# Patient Record
Sex: Female | Born: 1955
Health system: Southern US, Community
[De-identification: ages and names within clinical notes are randomized; demographics above are authoritative.]

## PROBLEM LIST (undated history)

## (undated) DIAGNOSIS — T8859XA Other complications of anesthesia, initial encounter: Secondary | ICD-10-CM

## (undated) DIAGNOSIS — S2249XA Multiple fractures of ribs, unspecified side, initial encounter for closed fracture: Secondary | ICD-10-CM

## (undated) DIAGNOSIS — S92353A Displaced fracture of fifth metatarsal bone, unspecified foot, initial encounter for closed fracture: Secondary | ICD-10-CM

## (undated) DIAGNOSIS — R413 Other amnesia: Secondary | ICD-10-CM

## (undated) DIAGNOSIS — F329 Major depressive disorder, single episode, unspecified: Secondary | ICD-10-CM

## (undated) DIAGNOSIS — R102 Pelvic and perineal pain unspecified side: Secondary | ICD-10-CM

## (undated) DIAGNOSIS — E039 Hypothyroidism, unspecified: Secondary | ICD-10-CM

## (undated) DIAGNOSIS — G250 Essential tremor: Secondary | ICD-10-CM

## (undated) DIAGNOSIS — R51 Headache: Secondary | ICD-10-CM

## (undated) DIAGNOSIS — S32599A Other specified fracture of unspecified pubis, initial encounter for closed fracture: Secondary | ICD-10-CM

## (undated) DIAGNOSIS — F3289 Other specified depressive episodes: Secondary | ICD-10-CM

## (undated) DIAGNOSIS — B182 Chronic viral hepatitis C: Secondary | ICD-10-CM

## (undated) DIAGNOSIS — R3129 Other microscopic hematuria: Secondary | ICD-10-CM

## (undated) DIAGNOSIS — IMO0002 Reserved for concepts with insufficient information to code with codable children: Secondary | ICD-10-CM

## (undated) DIAGNOSIS — M5481 Occipital neuralgia: Secondary | ICD-10-CM

## (undated) DIAGNOSIS — M5412 Radiculopathy, cervical region: Secondary | ICD-10-CM

## (undated) DIAGNOSIS — B1920 Unspecified viral hepatitis C without hepatic coma: Secondary | ICD-10-CM

## (undated) DIAGNOSIS — G252 Other specified forms of tremor: Secondary | ICD-10-CM

## (undated) DIAGNOSIS — M1990 Unspecified osteoarthritis, unspecified site: Secondary | ICD-10-CM

## (undated) DIAGNOSIS — F4312 Post-traumatic stress disorder, chronic: Secondary | ICD-10-CM

## (undated) DIAGNOSIS — M75100 Unspecified rotator cuff tear or rupture of unspecified shoulder, not specified as traumatic: Secondary | ICD-10-CM

## (undated) DIAGNOSIS — I10 Essential (primary) hypertension: Secondary | ICD-10-CM

## (undated) DIAGNOSIS — G43909 Migraine, unspecified, not intractable, without status migrainosus: Secondary | ICD-10-CM

## (undated) HISTORY — DX: Other amnesia: R41.3

## (undated) HISTORY — DX: Essential tremor: G25.2

## (undated) HISTORY — DX: Other microscopic hematuria: R31.29

## (undated) HISTORY — DX: Migraine, unspecified, not intractable, without status migrainosus: G43.909

## (undated) HISTORY — DX: Essential tremor: G25.0

## (undated) HISTORY — PX: BREAST EXCISIONAL BIOPSY: SUR124

## (undated) HISTORY — DX: Pelvic and perineal pain: R10.2

## (undated) HISTORY — DX: Radiculopathy, cervical region: M54.12

## (undated) HISTORY — DX: Unspecified viral hepatitis C without hepatic coma: B19.20

## (undated) HISTORY — DX: Pelvic and perineal pain unspecified side: R10.20

## (undated) HISTORY — DX: Unspecified rotator cuff tear or rupture of unspecified shoulder, not specified as traumatic: M75.100

## (undated) HISTORY — DX: Major depressive disorder, single episode, unspecified: F32.9

## (undated) HISTORY — DX: Reserved for concepts with insufficient information to code with codable children: IMO0002

## (undated) HISTORY — DX: Chronic viral hepatitis C: B18.2

## (undated) HISTORY — DX: Headache: R51

## (undated) HISTORY — DX: Displaced fracture of fifth metatarsal bone, unspecified foot, initial encounter for closed fracture: S92.353A

## (undated) HISTORY — DX: Other specified depressive episodes: F32.89

## (undated) HISTORY — DX: Multiple fractures of ribs, unspecified side, initial encounter for closed fracture: S22.49XA

## (undated) HISTORY — DX: Other specified fracture of unspecified pubis, initial encounter for closed fracture: S32.599A

---

## 1997-04-07 ENCOUNTER — Ambulatory Visit (HOSPITAL_COMMUNITY): Admission: RE | Admit: 1997-04-07 | Discharge: 1997-04-07 | Payer: Self-pay | Admitting: Family Medicine

## 1997-09-29 ENCOUNTER — Ambulatory Visit (HOSPITAL_COMMUNITY): Admission: RE | Admit: 1997-09-29 | Discharge: 1997-09-29 | Payer: Self-pay | Admitting: Family Medicine

## 1997-09-29 ENCOUNTER — Encounter: Payer: Self-pay | Admitting: Family Medicine

## 1998-08-05 ENCOUNTER — Other Ambulatory Visit: Admission: RE | Admit: 1998-08-05 | Discharge: 1998-08-05 | Payer: Self-pay | Admitting: Family Medicine

## 1998-10-02 ENCOUNTER — Ambulatory Visit (HOSPITAL_COMMUNITY): Admission: RE | Admit: 1998-10-02 | Discharge: 1998-10-02 | Payer: Self-pay | Admitting: Family Medicine

## 1998-10-02 ENCOUNTER — Encounter: Payer: Self-pay | Admitting: Family Medicine

## 1998-10-05 ENCOUNTER — Other Ambulatory Visit: Admission: RE | Admit: 1998-10-05 | Discharge: 1998-10-05 | Payer: Self-pay | Admitting: Obstetrics and Gynecology

## 1999-10-05 ENCOUNTER — Ambulatory Visit (HOSPITAL_COMMUNITY): Admission: RE | Admit: 1999-10-05 | Discharge: 1999-10-05 | Payer: Self-pay | Admitting: Family Medicine

## 1999-10-05 ENCOUNTER — Encounter: Payer: Self-pay | Admitting: Family Medicine

## 1999-12-10 ENCOUNTER — Encounter: Admission: RE | Admit: 1999-12-10 | Discharge: 1999-12-10 | Payer: Self-pay | Admitting: Family Medicine

## 1999-12-10 ENCOUNTER — Encounter: Payer: Self-pay | Admitting: Family Medicine

## 1999-12-17 ENCOUNTER — Ambulatory Visit (HOSPITAL_COMMUNITY): Admission: RE | Admit: 1999-12-17 | Discharge: 1999-12-17 | Payer: Self-pay | Admitting: Family Medicine

## 1999-12-17 ENCOUNTER — Encounter: Payer: Self-pay | Admitting: Family Medicine

## 2000-01-24 ENCOUNTER — Other Ambulatory Visit: Admission: RE | Admit: 2000-01-24 | Discharge: 2000-01-24 | Payer: Self-pay | Admitting: Obstetrics and Gynecology

## 2000-03-24 ENCOUNTER — Other Ambulatory Visit: Admission: RE | Admit: 2000-03-24 | Discharge: 2000-03-24 | Payer: Self-pay | Admitting: Orthopedic Surgery

## 2000-10-09 ENCOUNTER — Ambulatory Visit (HOSPITAL_COMMUNITY): Admission: RE | Admit: 2000-10-09 | Discharge: 2000-10-09 | Payer: Self-pay | Admitting: Family Medicine

## 2000-10-09 ENCOUNTER — Encounter: Payer: Self-pay | Admitting: Family Medicine

## 2000-10-16 ENCOUNTER — Encounter: Payer: Self-pay | Admitting: Family Medicine

## 2000-10-16 ENCOUNTER — Encounter: Admission: RE | Admit: 2000-10-16 | Discharge: 2000-10-16 | Payer: Self-pay | Admitting: Family Medicine

## 2000-12-20 ENCOUNTER — Encounter: Admission: RE | Admit: 2000-12-20 | Discharge: 2000-12-20 | Payer: Self-pay | Admitting: Gastroenterology

## 2000-12-20 ENCOUNTER — Encounter: Payer: Self-pay | Admitting: Gastroenterology

## 2001-10-19 ENCOUNTER — Ambulatory Visit (HOSPITAL_COMMUNITY): Admission: RE | Admit: 2001-10-19 | Discharge: 2001-10-19 | Payer: Self-pay | Admitting: Family Medicine

## 2001-10-19 ENCOUNTER — Encounter: Payer: Self-pay | Admitting: Family Medicine

## 2002-01-03 HISTORY — PX: SHOULDER ARTHROSCOPY W/ ROTATOR CUFF REPAIR: SHX2400

## 2002-04-24 ENCOUNTER — Encounter: Admission: RE | Admit: 2002-04-24 | Discharge: 2002-04-24 | Payer: Self-pay | Admitting: Orthopedic Surgery

## 2002-04-24 ENCOUNTER — Encounter: Payer: Self-pay | Admitting: Orthopedic Surgery

## 2002-10-22 ENCOUNTER — Encounter: Payer: Self-pay | Admitting: Family Medicine

## 2002-10-22 ENCOUNTER — Ambulatory Visit (HOSPITAL_COMMUNITY): Admission: RE | Admit: 2002-10-22 | Discharge: 2002-10-22 | Payer: Self-pay | Admitting: Family Medicine

## 2003-10-29 ENCOUNTER — Ambulatory Visit (HOSPITAL_COMMUNITY): Admission: RE | Admit: 2003-10-29 | Discharge: 2003-10-29 | Payer: Self-pay | Admitting: Internal Medicine

## 2004-01-06 ENCOUNTER — Ambulatory Visit: Payer: Self-pay | Admitting: Gastroenterology

## 2004-10-06 ENCOUNTER — Ambulatory Visit (HOSPITAL_COMMUNITY): Admission: RE | Admit: 2004-10-06 | Discharge: 2004-10-06 | Payer: Self-pay | Admitting: Family Medicine

## 2005-10-07 ENCOUNTER — Ambulatory Visit (HOSPITAL_COMMUNITY): Admission: RE | Admit: 2005-10-07 | Discharge: 2005-10-07 | Payer: Self-pay | Admitting: Family Medicine

## 2006-01-03 HISTORY — PX: SHOULDER ARTHROSCOPY W/ ROTATOR CUFF REPAIR: SHX2400

## 2006-10-09 ENCOUNTER — Ambulatory Visit (HOSPITAL_COMMUNITY): Admission: RE | Admit: 2006-10-09 | Discharge: 2006-10-09 | Payer: Self-pay | Admitting: Family Medicine

## 2006-11-21 ENCOUNTER — Ambulatory Visit: Payer: Self-pay | Admitting: Gastroenterology

## 2007-04-05 ENCOUNTER — Ambulatory Visit (HOSPITAL_BASED_OUTPATIENT_CLINIC_OR_DEPARTMENT_OTHER): Admission: RE | Admit: 2007-04-05 | Discharge: 2007-04-05 | Payer: Self-pay | Admitting: Orthopedic Surgery

## 2007-10-10 ENCOUNTER — Ambulatory Visit (HOSPITAL_COMMUNITY): Admission: RE | Admit: 2007-10-10 | Discharge: 2007-10-10 | Payer: Self-pay | Admitting: Family Medicine

## 2008-10-10 ENCOUNTER — Ambulatory Visit (HOSPITAL_COMMUNITY): Admission: RE | Admit: 2008-10-10 | Discharge: 2008-10-10 | Payer: Self-pay | Admitting: Family Medicine

## 2009-02-10 ENCOUNTER — Ambulatory Visit (HOSPITAL_BASED_OUTPATIENT_CLINIC_OR_DEPARTMENT_OTHER): Admission: RE | Admit: 2009-02-10 | Discharge: 2009-02-10 | Payer: Self-pay | Admitting: Orthopedic Surgery

## 2009-05-21 ENCOUNTER — Ambulatory Visit: Payer: Self-pay | Admitting: Psychology

## 2009-07-22 ENCOUNTER — Ambulatory Visit: Payer: Self-pay | Admitting: Psychology

## 2009-09-15 ENCOUNTER — Ambulatory Visit (HOSPITAL_COMMUNITY): Admission: RE | Admit: 2009-09-15 | Discharge: 2009-09-15 | Payer: Self-pay | Admitting: Neurology

## 2009-10-16 ENCOUNTER — Ambulatory Visit (HOSPITAL_COMMUNITY): Admission: RE | Admit: 2009-10-16 | Discharge: 2009-10-16 | Payer: Self-pay | Admitting: Family Medicine

## 2009-12-21 ENCOUNTER — Ambulatory Visit (HOSPITAL_COMMUNITY): Admission: RE | Admit: 2009-12-21 | Payer: Self-pay | Source: Home / Self Care | Admitting: Family Medicine

## 2010-01-24 ENCOUNTER — Encounter: Payer: Self-pay | Admitting: Family Medicine

## 2010-02-24 ENCOUNTER — Emergency Department (HOSPITAL_BASED_OUTPATIENT_CLINIC_OR_DEPARTMENT_OTHER)
Admission: EM | Admit: 2010-02-24 | Discharge: 2010-02-24 | Disposition: A | Discharge status: Home / Self Care | Payer: 59 | Attending: Emergency Medicine | Admitting: Emergency Medicine

## 2010-02-24 DIAGNOSIS — IMO0002 Reserved for concepts with insufficient information to code with codable children: Secondary | ICD-10-CM | POA: Insufficient documentation

## 2010-02-24 DIAGNOSIS — G8929 Other chronic pain: Secondary | ICD-10-CM | POA: Insufficient documentation

## 2010-02-24 DIAGNOSIS — S139XXA Sprain of joints and ligaments of unspecified parts of neck, initial encounter: Secondary | ICD-10-CM | POA: Insufficient documentation

## 2010-02-24 DIAGNOSIS — M25519 Pain in unspecified shoulder: Secondary | ICD-10-CM | POA: Insufficient documentation

## 2010-02-24 DIAGNOSIS — Y929 Unspecified place or not applicable: Secondary | ICD-10-CM | POA: Insufficient documentation

## 2010-02-24 DIAGNOSIS — X58XXXA Exposure to other specified factors, initial encounter: Secondary | ICD-10-CM | POA: Insufficient documentation

## 2010-02-24 DIAGNOSIS — M5412 Radiculopathy, cervical region: Secondary | ICD-10-CM | POA: Insufficient documentation

## 2010-02-24 DIAGNOSIS — F341 Dysthymic disorder: Secondary | ICD-10-CM | POA: Insufficient documentation

## 2010-02-24 DIAGNOSIS — M542 Cervicalgia: Secondary | ICD-10-CM | POA: Insufficient documentation

## 2010-05-18 NOTE — Op Note (Signed)
Toni [name], Toni [name]       ACCOUNT NO.:  [id number]   MEDICAL RECORD NO.:  [id number]          PATIENT TYPE:  AMB   LOCATION:  DSC                          FACILITY:  [facility]   PHYSICIAN:  Katy Fitch. Sypher, M.D. DATE OF BIRTH:  02/16/1955   DATE OF PROCEDURE:  04/05/2007  DATE OF DISCHARGE:                               OPERATIVE REPORT   PREOPERATIVE DIAGNOSIS:  Chronic stage II impingement left shoulder with  acromioclavicular degenerative arthritis and tendinopathy of rotator  cuff noted on preoperative magnetic resonance imaging.   POSTOPERATIVE DIAGNOSIS:  Deep surface rotator cuff degenerative tear  measuring approximate 20% thickness of the supraspinatus, infraspinatus  with degenerative labral changes and unfavorable acromioclavicular  anatomy and anterolateral acromial morphology.   OPERATION:  1. Examination of left shoulder under anesthesia documenting capsular      stability.  2. Arthroscopic debridement of deep surface rotator cuff tear and      labral degenerative changes.  3. Arthroscopic subacromial decompression.  4. Arthroscopic distal clavicle resection.   SURGEON:  Josephine Igo, M.D.   ASSISTANT:  Molly Maduro Dasnoit PA-C.   ANESTHESIA:  General by endotracheal technique supplemented by a left  interscalene block.   SUPERVISING ANESTHESIOLOGIST:  Zenon Mayo, M.D.   INDICATIONS:  Toni [name] is a 55 year old woman who is well  acquainted with our practice.  She has a history of chronic left  shoulder pain.  An MRI of the left shoulder demonstrated AC arthropathy,  unfavorable anterolateral acromial morphology and rotator cuff  tendinopathy.   Due to a failure to respond to nonoperative measures including anti-  inflammatory medications, structured physical therapy exercises and  rest, she is now brought to the operating room anticipating arthroscopic  intervention.   Preoperatively she was noted have a partial thickness tear  rotator cuff.  She was advised that we would decompress the cuff and debride the tear  in an effort to try to preserve her rotator cuff health.   She understood that we would also thoroughly assess for shoulder with  the arthroscope and provide appropriate intervention based on our  findings.   After informed consent, she is brought to the operating room at this  time.   DESCRIPTION OF PROCEDURE:  Toni [name] is brought to the  operating room and placed in the supine position on the operating table.   Following placement of an interscalene block in the holding area,  anesthesia of the left upper extremity and forequarter was obtained.   She is brought to room 1, placed in the supine position on the operating  table and under Dr. Jarrett Ables direct supervision general endotracheal  anesthesia induced.   She was carefully positioned in the beach-chair position with the aid of  a torso _________  designed for shoulder arthroscopy.   The entire left upper extremity and forequarter prepped with DuraPrep  and draped with impervious arthroscopy drapes.   The procedure commenced with examination of the left shoulder under  anesthesia.  She was noted be stable in all planes of testing including  anterior, inferior and posterior stress.   The arthroscope was introduced through a standard posterior viewing  portal followed by identification of the intra-articular pathology.  She  was noted have a 20% thickness deep surface degenerative tear of the  supraspinatus infraspinatus tendons with fragments hanging within the  joint.  The long head of the biceps had a stable origin at the superior  labrum.  The labrum had degenerative changes from 2 o'clock anteriorly  to 10 o'clock posteriorly.  An anterior portal was created under direct  vision followed by use of a suction shaver to debride the labrum and  deep surface of the cuff to stable margin.  There was no evidence for a   full-thickness rotator cuff tear.  The anterior capsule was debrided of  limited synovitis.  The inferior recess was examined and found to be  normal.  The anterior, anterior inferior, inferior and inferior  posterior labrum was noted be normal.   After hemostasis was achieved with bipolar cautery, the arthroscope was  removed from the glenohumeral joint and placed in the subacromial space.   The subacromial space was notable for friable bursitis.  We performed  bursectomy with the cutting cautery and the suction shaver followed by  identification of the morphology of the coracoacromial arch.  The  acromion was cleared of soft tissues and leveled to a type 1 morphology  with relaxation of the coracoacromial ligament.  Hemostasis was achieved  with the bipolar cautery.  The capsule of the [facility] joint was violated by  the arthritis and the distal clavicle had significant osteophyte  present.  The distal 15 mm of clavicle was removed arthroscopically with  a suction bur.  Hemostasis was achieved with the bipolar cautery.  The  hypertrophic bursa was debrided to a stable margin followed by  hemostasis.  There was no sign of a bursal side rotator cuff tear.   The scope was removed and the wounds repaired with mattress suture of 3-  0 Prolene.   There were no apparent complications.   Ms. Wecker was placed in a sling and transferred to the recovery  room with stable vital signs.   We anticipate discharge to her home with prescriptions for Dilaudid 2 mg  one to two tablets p.o. q.4-6 h. p.r.n. pain 30 tablets without refill.  Also Motrin 600 mg one p.o. q.6 h. p.r.n. pain 30 tablets with one  refill and Keflex 500 mg one p.o. q.8 h. x4 days as a prophylactic  antibiotic.   She will return to our office for follow-up in 24 hours for dressing  change and back for therapy in approximately 72 hours.      Katy Fitch Sypher, M.D.  Electronically Signed     RVS/MEDQ  D:  04/05/2007   T:  04/05/2007  Job:  [id number]   cc:   Katy Fitch. Sypher, M.D.

## 2010-10-05 ENCOUNTER — Other Ambulatory Visit (HOSPITAL_COMMUNITY): Payer: Self-pay | Admitting: Family Medicine

## 2010-10-05 DIAGNOSIS — Z1231 Encounter for screening mammogram for malignant neoplasm of breast: Secondary | ICD-10-CM

## 2010-10-07 ENCOUNTER — Ambulatory Visit (HOSPITAL_COMMUNITY)
Admission: RE | Admit: 2010-10-07 | Discharge: 2010-10-07 | Disposition: A | Discharge status: Home / Self Care | Payer: 59 | Source: Ambulatory Visit | Attending: Family Medicine | Admitting: Family Medicine

## 2010-10-07 ENCOUNTER — Other Ambulatory Visit (HOSPITAL_COMMUNITY): Payer: Self-pay | Admitting: Family Medicine

## 2010-10-07 DIAGNOSIS — Z1231 Encounter for screening mammogram for malignant neoplasm of breast: Secondary | ICD-10-CM | POA: Insufficient documentation

## 2010-10-07 DIAGNOSIS — M8580 Other specified disorders of bone density and structure, unspecified site: Secondary | ICD-10-CM

## 2010-10-20 ENCOUNTER — Ambulatory Visit (HOSPITAL_COMMUNITY)
Admission: RE | Admit: 2010-10-20 | Discharge: 2010-10-20 | Disposition: A | Discharge status: Home / Self Care | Payer: 59 | Source: Ambulatory Visit | Attending: Family Medicine | Admitting: Family Medicine

## 2010-10-20 DIAGNOSIS — Z780 Asymptomatic menopausal state: Secondary | ICD-10-CM | POA: Insufficient documentation

## 2010-10-20 DIAGNOSIS — M8580 Other specified disorders of bone density and structure, unspecified site: Secondary | ICD-10-CM

## 2010-10-20 DIAGNOSIS — Z13820 Encounter for screening for osteoporosis: Secondary | ICD-10-CM | POA: Insufficient documentation

## 2011-07-20 ENCOUNTER — Other Ambulatory Visit: Payer: Self-pay | Admitting: Neurosurgery

## 2011-07-20 DIAGNOSIS — M542 Cervicalgia: Secondary | ICD-10-CM

## 2011-08-05 ENCOUNTER — Other Ambulatory Visit: Payer: 59

## 2011-08-22 ENCOUNTER — Ambulatory Visit
Admission: RE | Admit: 2011-08-22 | Discharge: 2011-08-22 | Disposition: A | Discharge status: Home / Self Care | Payer: 59 | Source: Ambulatory Visit | Attending: Neurosurgery | Admitting: Neurosurgery

## 2011-08-22 ENCOUNTER — Other Ambulatory Visit: Payer: Self-pay | Admitting: Neurosurgery

## 2011-08-22 VITALS — BP 129/73

## 2011-08-22 DIAGNOSIS — M542 Cervicalgia: Secondary | ICD-10-CM

## 2011-08-22 MED ORDER — IOHEXOL 300 MG/ML  SOLN
1.0000 mL | Freq: Once | INTRAMUSCULAR | Status: AC | PRN
  Administered 2011-08-22: 1 mL via INTRA_ARTICULAR

## 2011-08-22 MED ORDER — DIAZEPAM 5 MG PO TABS
10.0000 mg | ORAL_TABLET | Freq: Once | ORAL | Status: AC
  Administered 2011-08-22: 10 mg via ORAL

## 2011-09-19 ENCOUNTER — Other Ambulatory Visit (HOSPITAL_COMMUNITY): Payer: Self-pay | Admitting: Family Medicine

## 2011-09-19 DIAGNOSIS — Z1231 Encounter for screening mammogram for malignant neoplasm of breast: Secondary | ICD-10-CM

## 2011-10-13 ENCOUNTER — Ambulatory Visit (HOSPITAL_COMMUNITY)
Admission: RE | Admit: 2011-10-13 | Discharge: 2011-10-13 | Disposition: A | Discharge status: Home / Self Care | Payer: 59 | Source: Ambulatory Visit | Attending: Family Medicine | Admitting: Family Medicine

## 2011-10-13 DIAGNOSIS — Z1231 Encounter for screening mammogram for malignant neoplasm of breast: Secondary | ICD-10-CM

## 2012-05-14 ENCOUNTER — Ambulatory Visit: Payer: Self-pay | Admitting: Neurology

## 2012-05-22 ENCOUNTER — Ambulatory Visit (INDEPENDENT_AMBULATORY_CARE_PROVIDER_SITE_OTHER): Payer: 59 | Admitting: Neurology

## 2012-05-22 ENCOUNTER — Encounter: Payer: Self-pay | Admitting: Neurology

## 2012-05-22 VITALS — BP 121/81 | HR 90 | Temp 97.8°F | Ht 63.0 in | Wt 184.0 lb

## 2012-05-22 DIAGNOSIS — F329 Major depressive disorder, single episode, unspecified: Secondary | ICD-10-CM

## 2012-05-22 DIAGNOSIS — R413 Other amnesia: Secondary | ICD-10-CM

## 2012-05-22 DIAGNOSIS — F3289 Other specified depressive episodes: Secondary | ICD-10-CM

## 2012-05-22 DIAGNOSIS — F32A Depression, unspecified: Secondary | ICD-10-CM

## 2012-05-22 DIAGNOSIS — F411 Generalized anxiety disorder: Secondary | ICD-10-CM

## 2012-05-22 NOTE — Patient Instructions (Addendum)
You are stable from the neurological standpoint and can follow up with Dr. Collins Scotland and Dr. [name].

## 2012-05-22 NOTE — Progress Notes (Signed)
Subjective:    Patient ID: Toni [name] is a 57 y.o. female.  HPI  Interim history:   Toni [name] is a very pleasant 57 year old left-handed woman who presents for followup consultation of her memory loss. She is unaccompanied today. This is her first visit with me and she previously followed with Dr. Avie Echevaria and was last seen by him on 01/12/2012, at which time he felt that she was doing well in terms of her memory. Her falls assessment tool score at the time was 7. He also suggested neuropsychological testing down the Road. She has an underlying medical history of migraine headaches, chronic neck pain, depression, anxiety, hepatitis C, status post right shoulder surgery in 2004 and left shoulder surgery in 2008. She is currently on Celexa, multivitamin, vitamin D, Focalin XR, Christie, alprazolam, Imitrex, Relpax, Fosamax, hydrocodone, coenzyme Q10, fish oil.  I reviewed Dr. Imagene Gurney prior notes and the patient's records and below is a summary of that review:  57 year old left-handed woman who started noticing memory loss and 2010. She also had poor concentration. She has been seeing Dr. [name] for depression since October 2009. She noticed word finding difficulties. She had neuropsychological studies in July 2011 showing no definitive areas of cognitive impairment. She did have difficulty expressing and communicating. ESR, B12, RPR, and TSH were normal in the recent past. MRI brain without contrast in May 2011 showed mild bifrontal cortical atrophy, age disproportionate. She has no family history of dementia or personal history of head trauma, syphilis or alcohol abuse. She has undergone epidural injections under Dr. Ethelene Hal for neck pain. In December 2012 her MMSE was 30, clock drawing was 4, and animal fluency was 16. In June 2013 her MMSE was 28, clock drawing was 4, animal fluency was 9. In January her MMSE was 29, clock drawing was 4, animal fluency was 17.   The patient states  she is on disability for depression. She has stable memory and reports that her intelligence is in the top 2%. She asked to stop the focalin in the past. She had an MRI brain on 02/24/12: mild bifrontal is supratentorial cortical atrophy which is age disproportionate but appears stable compared with previous MRI scan dated 05/08/2009. She is tearful and has an appointment with Dr. [name].   Her Past Medical History Is Significant For: Past Medical History  Diagnosis Date  . Migraine, unspecified, without mention of intractable migraine without mention of status migrainosus   . Headache   . Brachial neuritis or radiculitis NOS   . Essential and other specified forms of tremor   . Acute hepatitis C without mention of hepatic coma   . Other specified disorders of rotator cuff syndrome of shoulder and allied disorders   . Depressive disorder, not elsewhere classified   . Memory loss     Her Past Surgical History Is Significant For: Past Surgical History  Procedure Laterality Date  . Shoulder arthroscopy w/ rotator cuff repair Right 2004  . Shoulder arthroscopy w/ rotator cuff repair Left 2008  . Cesarean section      3x    Her Family History Is Significant For: Family History  Problem Relation Age of Onset  . Stroke Father   . Diabetes Maternal Grandmother   . Headache Mother     Her Social History Is Significant For: History   Social History  . Marital Status: Divorced    Spouse Name: N/A    Number of Children: N/A  . Years of Education: N/A  Social History Main Topics  . Smoking status: Former Smoker    Types: Cigarettes    Quit date: 05/23/1978  . Smokeless tobacco: None  . Alcohol Use: No  . Drug Use: No  . Sexually Active: None   Other Topics Concern  . None   Social History Narrative  . None    Her Allergies Are:  Allergies  Allergen Reactions  . Codeine Nausea And Vomiting and Other (See Comments)    Shuts down GI tract  :   Her Current Medications Are:   Outpatient Encounter Prescriptions as of 05/22/2012  Medication Sig Dispense Refill  . ALPRAZolam (XANAX) 1 MG tablet       . antipyrine-benzocaine (AURALGAN) otic solution       . citalopram (CELEXA) 20 MG tablet       . FOCALIN XR 15 MG 24 hr capsule       . HYDROcodone-ibuprofen (VICOPROFEN) 7.5-200 MG per tablet       . PRISTIQ 50 MG 24 hr tablet       . RELPAX 40 MG tablet One tablet by mouth at onset of headache. May repeat in 2 hours if headache persists or recurs.       No facility-administered encounter medications on file as of 05/22/2012.  : Review of Systems  Constitutional: Positive for fatigue.  Cardiovascular: Positive for palpitations.  Endocrine:       Flushing  Musculoskeletal: Positive for myalgias.  Neurological: Positive for tremors and headaches.  Psychiatric/Behavioral: Positive for confusion and dysphoric mood. The patient is nervous/anxious.        Intense dreaming, too much sleep    Objective:  Neurologic Exam  Physical Exam Physical Examination:   Filed Vitals:   05/22/12 1447  BP: 121/81  Pulse: 90  Temp: 97.8 F (36.6 C)    General Examination: The patient is a very pleasant 57 y.o. female in no acute distress. She appears well-developed and well-nourished and well groomed.   HEENT: Normocephalic, atraumatic, pupils are equal, round and reactive to light and accommodation. Funduscopic exam is normal with sharp disc margins noted. Extraocular tracking is good without limitation to gaze excursion or nystagmus noted. Normal smooth pursuit is noted. Hearing is grossly intact. Tympanic membranes are clear bilaterally. Face is symmetric with normal facial animation and normal facial sensation. Speech is clear with no dysarthria noted. There is no hypophonia. There is no lip, neck/head, jaw or voice tremor. Neck is supple with full range of passive and active motion. There are no carotid bruits on auscultation. Oropharynx exam reveals: adequate dental  hygiene and no significant airway crowding. Mallampati is class II. Tongue protrudes centrally and palate elevates symmetrically.   Chest: Clear to auscultation without wheezing, rhonchi or crackles noted.  Heart: S1+S2+0, regular and normal without murmurs, rubs or gallops noted.   Abdomen: Soft, non-tender and non-distended with normal bowel sounds appreciated on auscultation.  Extremities: There is no pitting edema in the distal lower extremities bilaterally. Pedal pulses are intact.  Skin: Warm and dry without trophic changes noted. There are no varicose veins.  Musculoskeletal: exam reveals no obvious joint deformities, tenderness or joint swelling or erythema.   Neurologically:  Mental status: The patient is awake, alert and oriented in all 4 spheres. Her memory, attention, language and knowledge are appropriate. She is AAO x 4. There is no aphasia, agnosia, apraxia or anomia. Speech is clear with normal prosody and enunciation. Thought process is linear. Mood is depressed and affect is blunted.  Cranial nerves are as described above under HEENT exam. In addition, shoulder shrug is normal with equal shoulder height noted. Motor exam: Normal bulk, strength and tone is noted. There is no drift, tremor or rebound. Romberg is negative. Reflexes are 2+ throughout. Toes are downgoing bilaterally. Fine motor skills are intact with normal finger taps, normal hand movements, normal rapid alternating patting, normal foot taps and normal foot agility.  Cerebellar testing shows no dysmetria or intention tremor on finger to nose testing. Heel to shin is unremarkable bilaterally. There is no truncal or gait ataxia.  Sensory exam is intact to light touch, pinprick, vibration, temperature sense and proprioception in the upper and lower extremities.  Gait, station and balance are unremarkable. No veering to one side is noted. No leaning to one side is noted. Posture is age-appropriate and stance is narrow  based. No problems turning are noted. She turns en bloc. Tandem walk is unremarkable. Intact toe and heel stance is noted.               Assessment and Plan:   Assessment and Plan:  In summary, Toni [name] is a very pleasant 57 y.o.-year old female with a history of depression and anxiety. She has stable memory complaints, including difficulty with multitasking, Her physical exam is stable. She is doing fairly well from the neurological standpoint at this time and I reassured the patient in that regard.  I had a long chat with the patient about my findings. I am not sure she has dementia at this time. She has stable MRI findings and her neurological exam is non-focal. I believe, most of her symptoms are rooted in her mood disorder. She is very tearful today and denies SI/HI. She has an appointment with her therapist next week and Dr. [name] next month. She is advised to FU with neurology on an as needed basis. She was in agreement.

## 2012-09-14 ENCOUNTER — Other Ambulatory Visit (HOSPITAL_COMMUNITY): Payer: Self-pay | Admitting: Family Medicine

## 2012-09-14 DIAGNOSIS — Z1231 Encounter for screening mammogram for malignant neoplasm of breast: Secondary | ICD-10-CM

## 2012-10-12 ENCOUNTER — Ambulatory Visit (HOSPITAL_COMMUNITY)
Admission: RE | Admit: 2012-10-12 | Discharge: 2012-10-12 | Disposition: A | Discharge status: Home / Self Care | Payer: 59 | Source: Ambulatory Visit | Attending: Family Medicine | Admitting: Family Medicine

## 2012-10-12 DIAGNOSIS — Z1231 Encounter for screening mammogram for malignant neoplasm of breast: Secondary | ICD-10-CM | POA: Insufficient documentation

## 2013-08-30 ENCOUNTER — Other Ambulatory Visit (HOSPITAL_COMMUNITY): Payer: Self-pay | Admitting: Nurse Practitioner

## 2013-08-30 ENCOUNTER — Other Ambulatory Visit (HOSPITAL_BASED_OUTPATIENT_CLINIC_OR_DEPARTMENT_OTHER): Payer: Self-pay | Admitting: Nurse Practitioner

## 2013-08-30 DIAGNOSIS — Z1231 Encounter for screening mammogram for malignant neoplasm of breast: Secondary | ICD-10-CM

## 2013-08-30 DIAGNOSIS — M810 Age-related osteoporosis without current pathological fracture: Secondary | ICD-10-CM

## 2013-10-15 ENCOUNTER — Ambulatory Visit (HOSPITAL_COMMUNITY)
Admission: RE | Admit: 2013-10-15 | Discharge: 2013-10-15 | Disposition: A | Discharge status: Home / Self Care | Payer: 59 | Source: Ambulatory Visit | Attending: Nurse Practitioner | Admitting: Nurse Practitioner

## 2013-10-15 DIAGNOSIS — Z13820 Encounter for screening for osteoporosis: Secondary | ICD-10-CM | POA: Diagnosis not present

## 2013-10-15 DIAGNOSIS — Z1231 Encounter for screening mammogram for malignant neoplasm of breast: Secondary | ICD-10-CM | POA: Insufficient documentation

## 2013-10-15 DIAGNOSIS — Z780 Asymptomatic menopausal state: Secondary | ICD-10-CM | POA: Diagnosis not present

## 2013-10-15 DIAGNOSIS — M810 Age-related osteoporosis without current pathological fracture: Secondary | ICD-10-CM

## 2014-05-06 ENCOUNTER — Telehealth: Payer: Self-pay | Admitting: Family Medicine

## 2014-05-06 NOTE — Telephone Encounter (Signed)
Patient stopped by the office today wanting to address concerns about establishing care in August.  Pt has extensive neuro hx, chronic pain.  Currently taking HYDROcodone-ibuprofen (VICOPROFEN) 7.5-200 MG per tablet.  Pt wanting to ensure establishing care with new PCP.  Not a surgical candidate.  Will need a referral for pain management if [facility] is not going to take over pain medication.   Pt would like to be seen sooner than August.  Can she be seen sooner?

## 2014-05-08 NOTE — Telephone Encounter (Signed)
Please get records

## 2014-05-08 NOTE — Telephone Encounter (Signed)
Spoke with patient. Patient states MD [facility] and [facility] with Neurology off [address] [phone]) will be managing her pain. Patient has an appointment with them next month in June. Patient states she would like to establish care with MD Panosh as her PCP and Neuro will manage the pain component. Educated patient that worked out great because MD Panosh does not prescribe pain medications or do chronic pain management. Patient verbalized understanding and looks forward to appointment in August.

## 2014-05-08 NOTE — Telephone Encounter (Signed)
I will not be prescribing  Pain meds  Or do chronic pain management  .   She can choose other provider  If  She wishes needed. Other wise : Get copy  For my review  of last 3 years of medical records or pertinent  Regarding her health  Labs consults immunizations .  To evaluate health care needs.

## 2014-06-05 ENCOUNTER — Emergency Department (HOSPITAL_COMMUNITY)
Admission: EM | Admit: 2014-06-05 | Discharge: 2014-06-05 | Disposition: A | Discharge status: Home / Self Care | Payer: 59 | Attending: Emergency Medicine | Admitting: Emergency Medicine

## 2014-06-05 ENCOUNTER — Encounter (HOSPITAL_COMMUNITY): Payer: Self-pay | Admitting: Emergency Medicine

## 2014-06-05 DIAGNOSIS — F1110 Opioid abuse, uncomplicated: Secondary | ICD-10-CM | POA: Diagnosis not present

## 2014-06-05 DIAGNOSIS — Z79899 Other long term (current) drug therapy: Secondary | ICD-10-CM | POA: Diagnosis not present

## 2014-06-05 DIAGNOSIS — Z8619 Personal history of other infectious and parasitic diseases: Secondary | ICD-10-CM | POA: Insufficient documentation

## 2014-06-05 DIAGNOSIS — F1310 Sedative, hypnotic or anxiolytic abuse, uncomplicated: Secondary | ICD-10-CM | POA: Insufficient documentation

## 2014-06-05 DIAGNOSIS — Z87891 Personal history of nicotine dependence: Secondary | ICD-10-CM | POA: Insufficient documentation

## 2014-06-05 DIAGNOSIS — Z8739 Personal history of other diseases of the musculoskeletal system and connective tissue: Secondary | ICD-10-CM | POA: Insufficient documentation

## 2014-06-05 DIAGNOSIS — R4182 Altered mental status, unspecified: Secondary | ICD-10-CM | POA: Diagnosis present

## 2014-06-05 DIAGNOSIS — F0390 Unspecified dementia without behavioral disturbance: Secondary | ICD-10-CM | POA: Diagnosis not present

## 2014-06-05 DIAGNOSIS — F329 Major depressive disorder, single episode, unspecified: Secondary | ICD-10-CM | POA: Insufficient documentation

## 2014-06-05 DIAGNOSIS — G43909 Migraine, unspecified, not intractable, without status migrainosus: Secondary | ICD-10-CM | POA: Insufficient documentation

## 2014-06-05 LAB — CBC WITH DIFFERENTIAL/PLATELET
Basophils Absolute: 0 10*3/uL (ref 0.0–0.1)
Basophils Relative: 0 % (ref 0–1)
Eosinophils Absolute: 0.1 10*3/uL (ref 0.0–0.7)
Eosinophils Relative: 1 % (ref 0–5)
HCT: 44.5 % (ref 36.0–46.0)
HEMOGLOBIN: 14.4 g/dL (ref 12.0–15.0)
Lymphocytes Relative: 24 % (ref 12–46)
Lymphs Abs: 2 10*3/uL (ref 0.7–4.0)
MCH: 29.9 pg (ref 26.0–34.0)
MCHC: 32.4 g/dL (ref 30.0–36.0)
MCV: 92.3 fL (ref 78.0–100.0)
MONOS PCT: 8 % (ref 3–12)
Monocytes Absolute: 0.6 10*3/uL (ref 0.1–1.0)
NEUTROS ABS: 5.6 10*3/uL (ref 1.7–7.7)
Neutrophils Relative %: 67 % (ref 43–77)
PLATELETS: 236 10*3/uL (ref 150–400)
RBC: 4.82 MIL/uL (ref 3.87–5.11)
RDW: 12.3 % (ref 11.5–15.5)
WBC: 8.3 10*3/uL (ref 4.0–10.5)

## 2014-06-05 LAB — URINALYSIS, ROUTINE W REFLEX MICROSCOPIC
Bilirubin Urine: NEGATIVE
Glucose, UA: NEGATIVE mg/dL
Hgb urine dipstick: NEGATIVE
Ketones, ur: NEGATIVE mg/dL
Leukocytes, UA: NEGATIVE
NITRITE: NEGATIVE
Protein, ur: NEGATIVE mg/dL
SPECIFIC GRAVITY, URINE: 1.015 (ref 1.005–1.030)
UROBILINOGEN UA: 0.2 mg/dL (ref 0.0–1.0)
pH: 5 (ref 5.0–8.0)

## 2014-06-05 LAB — COMPREHENSIVE METABOLIC PANEL
ALBUMIN: 4.7 g/dL (ref 3.5–5.0)
ALK PHOS: 53 U/L (ref 38–126)
ALT: 64 U/L — ABNORMAL HIGH (ref 14–54)
ANION GAP: 13 (ref 5–15)
AST: 60 U/L — AB (ref 15–41)
BILIRUBIN TOTAL: 0.3 mg/dL (ref 0.3–1.2)
BUN: 28 mg/dL — AB (ref 6–20)
CHLORIDE: 100 mmol/L — AB (ref 101–111)
CO2: 28 mmol/L (ref 22–32)
Calcium: 9.9 mg/dL (ref 8.9–10.3)
Creatinine, Ser: 0.96 mg/dL (ref 0.44–1.00)
GFR calc non Af Amer: >60 mL/min (ref >60)
Glucose, Bld: 113 mg/dL — ABNORMAL HIGH (ref 65–99)
Potassium: 4.3 mmol/L (ref 3.5–5.1)
Sodium: 141 mmol/L (ref 135–145)
Total Protein: 8.3 g/dL — ABNORMAL HIGH (ref 6.5–8.1)

## 2014-06-05 LAB — RAPID URINE DRUG SCREEN, HOSP PERFORMED
AMPHETAMINES: NOT DETECTED
Barbiturates: NOT DETECTED
Benzodiazepines: POSITIVE — AB
Cocaine: NOT DETECTED
OPIATES: POSITIVE — AB
Tetrahydrocannabinol: NOT DETECTED

## 2014-06-05 LAB — ETHANOL: Alcohol, Ethyl (B): <5 mg/dL (ref <5)

## 2014-06-05 LAB — AMMONIA: Ammonia: 20 umol/L (ref 9–35)

## 2014-06-05 LAB — LACTIC ACID, PLASMA: Lactic Acid, Venous: 0.8 mmol/L (ref 0.5–2.0)

## 2014-06-05 NOTE — ED Provider Notes (Signed)
CSN: [id number]     Arrival date & time 06/05/14  1539 History   First MD Initiated Contact with Patient 06/05/14 1552     Chief Complaint  Patient presents with  . Altered Mental Status     (Consider location/radiation/quality/duration/timing/severity/associated sxs/prior Treatment) HPI   Toni [name] is a 59 y.o. female brought in by EMS, reportedly for confusion, at a dentist office. Staff there noticed that she presented to their office, but did not have an appointment, and they did not know her. Since she was confused. An ambulance was summoned and they transferred her here. The patient states that she has frontal temporal degeneration, and she believes that is why she is confused. She states that she drives her car is at her home. She states that her dentist is Dr. Tresa MooreJewson. She does not currently have a neurologist and plans to see a new provider, Provider soon. She sees her psychiatrist, Dr. Evelene CroonKaur, regularly. She states that she feels sad over her illness, and occasionally cries about it. She is trying to help her young son. She has 2 other children who live in [place]. She lives alone. By report, and her father is "close by." She denies recent illnesses. There are no other known modifying factors.  Level V Caveat- dementia   Past Medical History  Diagnosis Date  . Migraine, unspecified, without mention of intractable migraine without mention of status migrainosus   . Headache(784.0)   . Brachial neuritis or radiculitis NOS   . Essential and other specified forms of tremor   . Acute hepatitis C without mention of hepatic coma   . Other specified disorders of rotator cuff syndrome of shoulder and allied disorders   . Depressive disorder, not elsewhere classified   . Memory loss    Past Surgical History  Procedure Laterality Date  . Shoulder arthroscopy w/ rotator cuff repair Right 2004  . Shoulder arthroscopy w/ rotator cuff repair Left 2008  . Cesarean  section      3x   Family History  Problem Relation Age of Onset  . Stroke Father   . Diabetes Maternal Grandmother   . Headache Mother    History  Substance Use Topics  . Smoking status: Former Smoker    Types: Cigarettes    Quit date: 05/23/1978  . Smokeless tobacco: Never Used  . Alcohol Use: No   OB History    No data available     Review of Systems  All other systems reviewed and are negative.     Allergies  Codeine  Home Medications   Prior to Admission medications   Medication Sig Start Date End Date Taking? Authorizing Provider  ALPRAZolam (XANAX XR) 1 MG 24 hr tablet Take 1 mg by mouth daily as needed for anxiety.  05/02/14  Yes Historical Provider, MD  ALPRAZolam Prudy Feeler(XANAX) 1 MG tablet Take 1 mg by mouth 4 (four) times daily as needed for anxiety (shakes).   Yes Historical Provider, MD  antipyrine-benzocaine Lyla Son(AURALGAN) otic solution Place 3-4 drops into both ears every 2 (two) hours as needed for ear pain.  02/27/12  Yes Historical Provider, MD  cyclobenzaprine (FLEXERIL) 10 MG tablet Take 1 tablet by mouth 2 (two) times daily as needed. Muscle spasms/headaches 06/01/14  Yes Historical Provider, MD  HYDROcodone-ibuprofen (VICOPROFEN) 7.5-200 MG per tablet Take 0.5-1 tablets by mouth 4 (four) times daily as needed for moderate pain or severe pain.  04/20/12  Yes Historical Provider, MD  RELPAX 40 MG tablet  Take 40 mg by mouth every 2 (two) hours as needed for migraine.  05/11/12  Yes Historical Provider, MD  SUMAtriptan 6 MG/0.5ML SOAJ Inject 6 mg into the skin daily as needed. For migranes 04/15/14  Yes Historical Provider, MD  traZODone (DESYREL) 50 MG tablet Take 1 mg by mouth at bedtime. 1-2 qhs for sleep 03/25/14  Yes Historical Provider, MD  VIIBRYD 40 MG TABS Take 1 tablet by mouth daily. 05/15/14  Yes Historical Provider, MD   BP 153/91 mmHg  Pulse 111  Temp(Src) 98.3 F (36.8 C) (Oral)  Resp 20  SpO2 94% Physical Exam  Constitutional: She appears well-developed  and well-nourished.  HENT:  Head: Normocephalic and atraumatic.  Right Ear: External ear normal.  Left Ear: External ear normal.  Eyes: Conjunctivae and EOM are normal. Pupils are equal, round, and reactive to light.  Neck: Normal range of motion and phonation normal. Neck supple.  Cardiovascular: Normal rate, regular rhythm and normal heart sounds.   Pulmonary/Chest: Effort normal and breath sounds normal. She exhibits no bony tenderness.  Abdominal: Soft. There is no tenderness.  Musculoskeletal: Normal range of motion. She exhibits no tenderness.  Neurological: She is alert. No cranial nerve deficit or sensory deficit. She exhibits normal muscle tone. Coordination normal.  She is oriented to person and time  Skin: Skin is warm, dry and intact.  Psychiatric: Her behavior is normal.  Tearful and sad at times.  Nursing note and vitals reviewed.   ED Course  Procedures (including critical care time) Medications - No data to display  Patient Vitals for the past 24 hrs:  BP Temp Temp src Pulse Resp SpO2  06/05/14 1923 153/91 mmHg - - 111 20 94 %  06/05/14 1543 129/78 mmHg 98.3 F (36.8 C) Oral 73 18 98 %   Emergency department interventions.-   I discussed the case with Dr. Tresa Moore, who was aware that the patient was in his college office, which is next door to his office, around the time that she missed an appointment in his office.  Patient was seen by a nurse case manager who offered services, which the patient declined.     At discharge -Reevaluation with update and discussion. After initial assessment and treatment, an updated evaluation reveals clinical evaluation is unchanged. Patient's friend is here with her and states that she is at her baseline. Findings discussed with patient and friend, all questions answered.Mancel Bale L    Labs Review Labs Reviewed  COMPREHENSIVE METABOLIC PANEL - Abnormal; Notable for the following:    Chloride 100 (*)    Glucose, Bld 113  (*)    BUN 28 (*)    Total Protein 8.3 (*)    AST 60 (*)    ALT 64 (*)    All other components within normal limits  URINE RAPID DRUG SCREEN (HOSP PERFORMED) NOT AT [facility] - Abnormal; Notable for the following:    Opiates POSITIVE (*)    Benzodiazepines POSITIVE (*)    All other components within normal limits  URINE CULTURE  AMMONIA  CBC WITH DIFFERENTIAL/PLATELET  ETHANOL  LACTIC ACID, PLASMA  URINALYSIS, ROUTINE W REFLEX MICROSCOPIC (NOT AT [facility])    Imaging Review No results found.   EKG Interpretation   Date/Time:  Thursday June 05 2014 16:25:46 EDT Ventricular Rate:  73 PR Interval:  154 QRS Duration: 81 QT Interval:  394 QTC Calculation: 434 R Axis:   72 Text Interpretation:  Sinus rhythm No old tracing to compare Confirmed by  K[place]  MD, Mechele Collin ([id number]) on 06/05/2014 5:39:03 PM      MDM   Final diagnoses:  Dementia, without behavioral disturbance    Dementia with confusion, related to history of frontotemporal dementia. Patient is at her baseline. There is no evidence for delirium, worsening clinical status or risk for discharge.  Nursing Notes Reviewed/ Care Coordinated Applicable Imaging Reviewed Interpretation of Laboratory Data incorporated into ED treatment  The patient appears reasonably screened and/or stabilized for discharge and I doubt any other medical condition or other [facility] requiring further screening, evaluation, or treatment in the ED at this time prior to discharge.  Plan: Home Medications- usual; Home Treatments- rest; return here if the recommended treatment, does not improve the symptoms; Recommended follow up- PCP prn   Mancel Bale, MD 06/05/14 2349

## 2014-06-05 NOTE — Progress Notes (Signed)
CSW was notified by Nurse CM that the pt has a hx of frontal temporal degeneration and that the pt was found at an unknown dentist office confused today. However, pt states that she has has the same dentist for years. Patient admits to being confused.  CSW met with pt at bedside. Patient states that she feels safe to return home. Patient states that she has a great support system which includes neighbors.  CSW will make APS aware that the pt may be a potential danger to self.  Willette Brace [phone] ED CSW 06/05/2014 9:55 PM

## 2014-06-05 NOTE — ED Notes (Signed)
Pt not allowing me to perform the EKG. She states she just had one.

## 2014-06-05 NOTE — Progress Notes (Addendum)
[facility] by EDP to speak to patient for possible home health services/resources. Patient with pmhx of frontal temporal degeneration and major depressive disorder.  EMS notified to pick patient up at dentist office as patient was confused.  Staff at office did not know who she was and did not have an appointment there.  EMS brought patient to ED.  Patient reports she lives alone. "I am an empty nester in a house that once held five people."  Patient reports she is looking for a new pcp, but still goes to Dr. Dewain Penningammy Spears office is she needs too.  Patient reports her psychiatrist Dr. Helane RimaKauer has referred her to a new pcp and has an appointment with her new pcp in August.  Patient also [facility] but [facility].  "When I meet my new pcp, I am going to ask for a referral to a neurologist." Patient also reports her neurosurgeon (for issues with her neck) has referred her to a new "pain medicine doctor" and has an appointment on June 20th.  EDCM asked patient if she has friends and neighbors who can check in on her and assist her?  Patient stated, "Oh yes! 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  [facility] informed patient that EDP is concerned about her driving.  Patient states, "But I'm fine.  I'm very calm and mellow when I'm driving."  [facility] strongly encouraged patient to have her support systems drive her to where she needs to go.  EDCM explained to patient regarding home health services for RN for safety check and Child psychotherapistsocial worker.  Patient stated, "I'm fine, I know who to call when I need something."  Patient answered all of EDCM's questions appropriately, her speech is slow.  Patient is able to complete her ADL's without difficulty.  Patient reports she feels safe at home.  Patient reports she sees her psychiatrist regularly and  is very pleased with her.  EDP feels patient is safe to discharge.  [facility] patient with EDSW and EDRN.  Patient's neighbor coming to pick patient up from the ED.  No further EDCM needs at this time.  06/05/2014 A.Kaylor Maiers RNCM 1903pm. Patient is not agreeable for [facility] message to Dr. Collins ScotlandSpear but is agreeable to have [facility] send message regarding current situation to her psychiatrist Dr. Helane RimaKauer.  [facility] a [facility].  06/05/2014 A. Bennie DallasFerrero [facility] sent inbox message to Dr. Fabian SharpPanosh regarding patient ED visit requesting earlier appointment and social work involvement.  Also called Dr. Evelene CroonKaur office and left message regarding patient visit to ED, requesting follow up with patient and left [facility] phone number for call back.  No further EDCM needs at this time.

## 2014-06-05 NOTE — ED Notes (Signed)
Bed: WA07 Expected date:  Expected time:  Means of arrival:  Comments: EMS- 58yo F, AMS, possible psych

## 2014-06-05 NOTE — ED Notes (Signed)
Pt arrived to ED via [facility] c/o altered mental status.  EMS received a call from a local dentist's office that reported that the pt showed up for an appointment today.  She does not have a relationship with the dentist and did not have an appointment scheduled.  She appears very confused and is experiencing altered memory and reasoning.  She is well-groomed but tearful and has stated multiple times that she is "trying to get to a better state" although she is unable to elaborate.  Denies headache, nausea, vomiting, recent falls.  Vital signs WNL, CBG per EMS was 105.  EKG en route was unremarkable. No arm drop, no facial asymmetry. Neuro check WNL other than memory and slow speech.

## 2014-06-05 NOTE — ED Notes (Signed)
Pt resting comfortably in room.  RN provided a meal since pt reported that she felt like she may be developing a migraine.  RN also explained that once discharge papers are ready, she can be discharged home as long as someone is able to drive her from the hospital.  She tearfully stated that she has arranged for a neighbor to get her and take her home in an hour. No additional needs expressed.

## 2014-06-05 NOTE — Discharge Instructions (Signed)
Dementia °Dementia is a word that is used to describe problems with the brain and how it works. People with dementia have memory loss. They may also have problems with thinking, speaking, or solving problems. It can affect how they act around people, how they do their job, their mood, and their personality. These changes may not show up for a long time. Family or friends may not notice problems in the early part of this disease. °HOME CARE °The following tips are for the person living with, or caring for, the person with dementia. °Make the home safe. °· Remove locks on bathroom doors. °· Use childproof locks on cabinets where alcohol, cleaning supplies, or chemicals are stored. °· Put outlet covers in electrical outlets. °· Put in childproof locks to keep doors and windows safe. °· Remove stove knobs, or put in safety knobs that shut off on their own. °· Lower the temperature on water heaters. °· Label medicines. Lock them in a safe place. °· Keep knives, lighters, matches, power tools, and guns out of reach or in a safe place. °· Remove objects that might break or can hurt the person. °· Make sure lighting is good inside and outside. °· Put in grab bars if needed. °· Use a device that detects falls or other needs for help. °Lessen confusion. °· Keep familiar objects and people around. °· Use night lights or low lit (dim) lights at night. °· Label objects or areas. °· Use reminders, notes, or directions for daily activities or tasks. °· Keep a simple routine that is the same for waking, meals, bathing, dressing, and bedtime. °· Create a calm and quiet home. °· Put up clocks and calendars. °· Keep emergency numbers and the home address near all phones. °· Help show the different times of day. Open the curtains during the day to let light in. °Speak clearly and directly. °· Choose simple words and short sentences. °· Use a gentle, calm voice. °· Do not interrupt. °· If the person has a hard time finding a word to  use, give them the word or thought. °· Ask 1 question at a time. Give enough time for the person to answer. Repeat the question if the person does not answer. °Do things that lessen restlessness. °· Provide a comfortable bed. °· Have the same bedtime routine every night. °· Have a regular walking and activity schedule. °· Lessen naps during the day. °· Do not let the person drink a lot of caffeine. °· Go to events that are not overwhelming. °Eat well and drink fluids. °· Lessen distractions during meal times and snacks. °· Avoid foods that are too hot or too cold. °· Watch how the person chews and swallows. This is to make sure they do not choke. °Other °· Keep all vision, hearing, dental, and medical visits with the doctor. °· Only give medicines as told by the doctor. °· Watch the person's driving ability. Do not let the person drive if he or she cannot drive safely. °· Use a program that helps find a person if they become missing. You may need to register with this program. °GET HELP RIGHT AWAY IF:  °· A fever of 102° F (38.9° C) develops. °· Confusion develops or gets worse. °· Sleepiness develops or gets worse. °· Staying awake is hard to do. °· New behavior problems start like mood swings, aggression, and seeing things that are not there. °· Problems with balance, speech, or falling develop. °· Problems swallowing develop. °· Any   problems of another sickness develop. °MAKE SURE YOU: °· Understand these instructions. °· Will watch his or her condition. °· Will get help right away if he or she is not doing well or gets worse. °Document Released: 12/03/2007 Document Revised: 03/14/2011 Document Reviewed: 05/17/2010 °ExitCare® Patient Information ©2015 [employer]. This information is not intended to replace advice given to you by your health care provider. Make sure you discuss any questions you have with your health care provider. ° °

## 2014-06-06 LAB — URINE CULTURE: Colony Count: 3000

## 2014-08-27 ENCOUNTER — Ambulatory Visit (INDEPENDENT_AMBULATORY_CARE_PROVIDER_SITE_OTHER): Payer: 59 | Admitting: Internal Medicine

## 2014-08-27 ENCOUNTER — Encounter: Payer: Self-pay | Admitting: Internal Medicine

## 2014-08-27 VITALS — BP 114/80 | Temp 97.7°F | Ht 62.0 in | Wt 179.2 lb

## 2014-08-27 DIAGNOSIS — F321 Major depressive disorder, single episode, moderate: Secondary | ICD-10-CM | POA: Diagnosis not present

## 2014-08-27 DIAGNOSIS — R413 Other amnesia: Secondary | ICD-10-CM

## 2014-08-27 DIAGNOSIS — Z79891 Long term (current) use of opiate analgesic: Secondary | ICD-10-CM | POA: Insufficient documentation

## 2014-08-27 DIAGNOSIS — R7989 Other specified abnormal findings of blood chemistry: Secondary | ICD-10-CM

## 2014-08-27 DIAGNOSIS — M5030 Other cervical disc degeneration, unspecified cervical region: Secondary | ICD-10-CM | POA: Diagnosis not present

## 2014-08-27 DIAGNOSIS — Z79899 Other long term (current) drug therapy: Secondary | ICD-10-CM

## 2014-08-27 DIAGNOSIS — B1920 Unspecified viral hepatitis C without hepatic coma: Secondary | ICD-10-CM

## 2014-08-27 DIAGNOSIS — R945 Abnormal results of liver function studies: Secondary | ICD-10-CM

## 2014-08-27 NOTE — Progress Notes (Signed)
Pre visit review using our clinic review tool, if applicable. No additional management support is needed unless otherwise documented below in the visit note.  Chief Complaint  Patient presents with  . Establish Care    HPI: Patient  Toni [name]  59 y.o. comes in today for new patient  Health Care visit   Her previous PCP was Dr. Yehuda Budd who is left practice Her psychiatrist is Dr. [name] who is been working with her about depression. Major  She had seen Dr. 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 Since that time she saw a neurologist at [facility] neurology and then in the last year Dr. Conrad Dorchester at [facility] 2 times. She has some of the records she also had neuropsych evaluation by Drs. Elsner in 2011.  She has seen Dr. Helene Kelp and is under care for degenerative disc disease neck and sometimes back and takes chronic opiates but hasn't accelerated the dose for years. She takes 3 times a day Vicoprofen.  She takes daily benzo diazepam all present lamp and also anti-depressed since recently given that she felt triggered an emergency room visit associated with confusion and dementia symptoms. Her tox screen then showed opiates and benzos only.  She has a history of hepatitis C but no records at this time and unclear if she has hepatitis with this. She declined blood testing for viral copies in treatment at this time. During the middle of this visit the Epic system was not working so unable to retrieve some information.  She has headaches and is on medicine for that also.  States that she is not very functional and just wants to be able to get out of bed in the morning. Some days are better than others she tends to stay to herself less social activity.  She was fully functioning 5 or 6 years ago  as she says and helped raise her children. 2 sons living married succeeding in [place] youngest son out of rehabilitation doing well and she is pleased about this.  She is seen Dr. Patsi Sears or urinary symptoms microscopic hematuria vaginal dryness no other intervention.  She states she had a tremor when she was much younger and was on Valium 3 times a day and eventually got off of it and had no problems withdrawing from it unclear if she had evaluation for a tremor.  She states she takes Flexeril at night if she wants to sleep better.  Health Maintenance  Topic Date Due  . Hepatitis C Screening  [birth year]  . HIV Screening  [birth year]/1968  . TETANUS/TDAP  10/03/1972  . ZOSTAVAX  10/03/2013  . COLONOSCOPY  01/03/2014  . INFLUENZA VACCINE  08/04/2014  . MAMMOGRAM  10/16/2015   Health Maintenance Review LIFESTYLE:  Exercise:  No Tobacco/ETS: No Alcohol: per day no Sugar beverages: Sleep: 10-12 hours no sleep apnea reported household alone and has 2 cats Drug use: no except prescribed drugs Bone density:  Colonoscopy:  PAP: October 2015 gravida 3 para 3 she's menopausal last colon screen 2005 MAMMO:   ROS:  GEN/ HEENT: No fever, significant weight changes sweats headaches vision problems hearing changes, CV/ PULM; No chest pain shortness of breath cough, syncope,edema  change in exercise tolerance. GI /GU: No adominal pain, vomiting, change in bowel habits. No blood in the stool. No significant GU symptoms. SKIN/HEME: ,  no acute skin rashes suspicious lesions or bleeding. No lymphadenopathy, nodules, masses.  NEURO/ PSYCH:  No neurologic signs such as weakness numbness. No depression anxiety. IMM/ Allergy: No unusual infections.  Allergy .   REST of 12 system review negative except as per HPI   Past Medical History  Diagnosis Date  . Migraine, unspecified, without mention of intractable migraine without mention of status migrainosus   . Headache(784.0)   . Brachial neuritis or  radiculitis NOS   . Essential and other specified forms of tremor   . Acute hepatitis C without mention of hepatic coma     No treatment  . Other specified disorders of rotator cuff syndrome of shoulder and allied disorders   . Depressive disorder, not elsewhere classified   . Memory loss     Past Surgical History  Procedure Laterality Date  . Shoulder arthroscopy w/ rotator cuff repair Right 19-May-2002  . Shoulder arthroscopy w/ rotator cuff repair Left 05/19/06  . Cesarean section      3x    Family History  Problem Relation Age of Onset  . Stroke Father   . Diabetes Maternal Grandmother   . Headache Mother   . Other Son     Mother is deceased May 19, 2011 father moved to the area 05/18/2012.    Social History   Social History  . Marital Status: Divorced    Spouse Name: N/A  . Number of Children: N/A  . Years of Education: N/A   Social History Main Topics  . Smoking status: Former Smoker    Types: Cigarettes    Quit date: 05/23/1978  . Smokeless tobacco: Never Used  . Alcohol Use: No  . Drug Use: No  . Sexual Activity: Not Asked   Other Topics Concern  . 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 Dr. love.   She is gravida 3 para 3       Outpatient Prescriptions Prior to Visit  Medication Sig Dispense Refill  . ALPRAZolam (XANAX XR) 1 MG 24 hr tablet Take 1 mg by mouth daily as needed for anxiety.   0  . ALPRAZolam (XANAX) 1 MG tablet Take 1 mg by mouth 4 (four) times daily as needed for anxiety (shakes).    Marland Kitchen antipyrine-benzocaine (AURALGAN) otic solution Place 3-4 drops into both ears every 2 (two) hours as needed for ear pain.     . cyclobenzaprine (FLEXERIL) 10 MG tablet Take 1 tablet by mouth 2 (two) times daily as needed. Muscle spasms/headaches    . HYDROcodone-ibuprofen (VICOPROFEN) 7.5-200  MG per tablet Take 0.5-1 tablets by mouth 4 (four) times daily as needed for moderate pain or severe pain.     Marland Kitchen RELPAX 40 MG tablet Take 40 mg by mouth every 2 (two) hours as needed for migraine.     . SUMAtriptan 6 MG/0.5ML SOAJ Inject 6 mg into the skin daily as needed. For migranes    . VIIBRYD 40 MG TABS Take 1 tablet by mouth daily.  12  . traZODone (DESYREL) 50 MG tablet Take 1 mg by mouth at bedtime. 1-2 qhs for sleep  5   No facility-administered medications prior to visit.     EXAM:  BP 114/80 mmHg  Temp(Src) 97.7 F (36.5 C) (Oral)  Ht  (1.575  m)  Wt 179 lb 3.2 oz (81.285 kg)  BMI 32.77 kg/m2  Body mass index is 32.77 kg/(m^2).  Physical Exam: Vital signs reviewed YNW:GNFA is a well-developed well-nourished alert cooperative    who appearsr stated age in no acute distress. Talkative looks tired somewhat depressed HEENT: normocephalic atraumatic , Eyes: PERRL EOM's full, conjunctiva clear, Nares: paten,t no deformity discharge or tenderness., NECK: supple without masses, thyromegaly or bruits. CV: PMI is nondisplaced, S1 S2 no gallops, murmurs, rubs. Peripheral pulses are full without delay.No JVD .  Extremtities:  No clubbing cyanosis or edema, no acute joint swelling or redness no focal atrophy NEURO:  Oriented x3, cranial nerves 3-12 appear to be intact, no obvious focal weakness,gait within normal limits no tremor but her handwriting is shaky. EOMs appear full. SKIN: Nonicteric PSYCH: Oriented, good eye contact,   speech was normal but wandered and became emotional. No dysarthria.   Lab Results  Component Value Date   WBC 8.3 06/05/2014   HGB 14.4 06/05/2014   HCT 44.5 06/05/2014   PLT 236 06/05/2014   GLUCOSE 113* 06/05/2014   ALT 64* 06/05/2014   AST 60* 06/05/2014   NA 141 06/05/2014   K 4.3 06/05/2014   CL 100* 06/05/2014   CREATININE 0.96 06/05/2014   BUN 28* 06/05/2014   CO2 28 06/05/2014    ASSESSMENT AND PLAN:  Discussed the following  assessment and plan:  Memory difficulties - She describes bilateral frontal cortical degeneration advanced for age per dr [name]  - Plan: Ambulatory referral to Neurology  Major depressive disorder, single episode, moderate  Degenerative disc disease, cervical  Chronically on opiate therapy -  years currently Dr. Newell [name] escalation  Hepatitis C virus infection without hepatic coma, unspecified chronicity - Declines further workup at this visit. Had abnormal LFTs in the ED  Abnormal LFTs She has folders of medical information which is helpful some of the more copy today for further reviewed later. She asked for a neurology referral to a more local neurologist that might be of help. We discussed expectations. It doesn't appear that she has a progressive neurologic disease at this time but following would be helpful. She is aware that the original neuropsychiatric evaluation advised that there was severe emotional depressive symptoms interfering with her function but executive o function difficulty would make it worse She bemoans the fact that she was a high functioning college graduate and now just hopes that she can get out of bed she is tearful at times emotional that she hurts but walks with a normal gait. It is unclear at this time if her medications are helping her alarming and not having been a part of the initial prescription is hard for me to evaluate that. But did caution her that with her specialist she should review her medications on a regular basis. Narcotics and benzos can cause memory difficulties and aggravate depression in some situations. I'm not sure why she takes Flexeril at night if she wants to sleep. Was not able to address that more fully today as the computer system was down. I would discourage that in the future with her current symptom difficulties. Her thought process seems to be scattered at this time and I cannot tell if it's the underlying problem oral medications plus  depression Viewed expectations of primary care healthcare maintenance referrals as needed coordination of care. Prolonged visit time we'll review records. Follow-up after neurology evaluation.50 minutes    Patient Care Team: Madelin Headings, MD as PCP -  General (Internal Medicine) Shirlean Kelly, MD as Consulting Physician (Neurosurgery) Jethro Bolus, MD as Consulting Physician (Urology) Milagros Evener, MD as Consulting Physician (Psychiatry) There are no Patient Instructions on file for this visit.  Neta Mends. Loetta Connelley M.D.   Shelina, Luo Female [birth year] [id number]    Progress Notes by Tharon Aquas, RN at 06/05/2014 6:28 PM    Author: Tharon Aquas, RN Service: CASE MANAGEMENT Author Type: Registered Nurse   Filed: 06/05/2014 7:35 PM Note Time: 06/05/2014 6:28 PM Status: Addendum   Editor: Amy Marylouise Stacks, RN (Registered Nurse)     Related Notes: Original Note by Tharon Aquas, RN (Registered Nurse) filed at 06/05/2014 7:07 PM   Expand All Collapse All   EDCM consulted by EDP to speak to patient for possible home health services/resources. Patient with pmhx of frontal temporal degeneration and major depressive disorder. EMS notified to pick patient up at dentist office as patient was confused. Staff at office did not know who she was and did not have an appointment there. EMS brought patient to ED. Patient reports she lives alone. "I am an empty nester in a house that once held five people." Patient reports she is looking for a new pcp, but still goes to Dr. Dewain Penning office is she needs too. Patient reports her psychiatrist Dr. Helane Rima has referred her to a new pcp and has an appointment with her new pcp in August. Patient also reports she has a neurologist at [facility] but would like to have a neurologist at [facility]. "When I meet my new pcp, I am going to ask for a referral to a neurologist." Patient also reports her neurosurgeon (for issues with her neck) has  referred her to a new "pain medicine doctor" and has an appointment on June 20th. EDCM asked patient if she has friends and neighbors who can check in on her and assist her? Patient stated, "Oh yes! 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 [facility] informed patient that EDP is concerned about her driving. Patient states, "But I'm fine. I'm very calm and mellow when I'm driving." [facility] strongly encouraged patient to have her support systems drive her to where she needs to go. EDCM explained to patient regarding home health services for RN for safety check and Child psychotherapist. Patient stated, "I'm fine, I know who to call when I need something." Patient answered all of EDCM's questions appropriately, her speech is slow. Patient is able to complete her ADL's without difficulty. Patient reports she feels safe at home. Patient reports she sees her psychiatrist regularly and is very pleased with her. EDP feels patient is safe to discharge. [facility] discussed patient with EDSW and EDRN. Patient's neighbor coming to pick patient up from the ED. No further EDCM needs at this time.  06/05/2014 A.Ferrero RNCM 1903pm. Patient is not agreeable for [facility] to send message to Dr. Collins Scotland but is agreeable to have [facility] send message regarding current situation to her psychiatrist Dr. Helane Rima. Patient is not a candidate for [facility].  06/05/2014 A. Bennie [facility] sent inbox message to Dr. Fabian Sharp regarding patient ED visit requesting earlier appointment and social work involvement. Also called Dr. [name] office and left message regarding patient visit to ED, requesting follow up with patient and left [facility] phone number for call back. No further EDCM needs at this time.  A 

## 2014-08-27 NOTE — Patient Instructions (Signed)
Neuro referral  Record review  Then FU .  See notes

## 2014-10-08 ENCOUNTER — Ambulatory Visit (INDEPENDENT_AMBULATORY_CARE_PROVIDER_SITE_OTHER): Payer: 59 | Admitting: Neurology

## 2014-10-08 ENCOUNTER — Encounter: Payer: Self-pay | Admitting: Neurology

## 2014-10-08 VITALS — BP 108/74 | HR 106 | Resp 16 | Ht 62.0 in | Wt 180.0 lb

## 2014-10-08 DIAGNOSIS — F329 Major depressive disorder, single episode, unspecified: Secondary | ICD-10-CM

## 2014-10-08 DIAGNOSIS — F411 Generalized anxiety disorder: Secondary | ICD-10-CM | POA: Diagnosis not present

## 2014-10-08 DIAGNOSIS — R251 Tremor, unspecified: Secondary | ICD-10-CM

## 2014-10-08 DIAGNOSIS — F32A Depression, unspecified: Secondary | ICD-10-CM | POA: Insufficient documentation

## 2014-10-08 DIAGNOSIS — R413 Other amnesia: Secondary | ICD-10-CM

## 2014-10-08 NOTE — Patient Instructions (Signed)
1. Refer for Neuropsychological evaluation at [facility] 2. Continue working with your psychiatrist and psychologist on depression and anxiety 3. Follow-up after Neuropsych testing

## 2014-10-08 NOTE — Progress Notes (Signed)
NEUROLOGY CONSULTATION NOTE  Toni [name] MRN: [id number] DOB: 1955-04-30  Referring provider: Dr. Berniece Andreas Primary care provider: Dr. Berniece Andreas  Reason for consult:  Establish care for memory problems  Dear Dr Fabian Sharp:  Thank you for your kind referral of Toni [name] for consultation of the above symptoms. Although her history is well known to you, please allow me to reiterate it for the purpose of our medical record. The patient was accompanied to the clinic by her ex-husband who also provides collateral information. Records and images were personally reviewed where available.  HISTORY OF PRESENT ILLNESS: This is a 59 year old left-handed woman with a history of migraines, treatment-resistant depression, anxiety, hepatitis C, presenting to establish care for presumed diagnosis of frontotemporal dementia. She had previously been seeing neurologist Dr. [name], then she saw Dr. [name] at [facility] one time, followed by 2 visits with Dr. Conrad Ralls at [facility]. I will summarize her history for our records. She reports she was high-performance, doing multiple things at the same time, until 2011 when she started having more difficulties at work. She had been unable to do things she used to do. She started having problems sleeping. She denied any problems with her memory. She had lack of stamina, word-finding difficulties, and had been going through stress with her son at that time. She had 2 brain MRI studies done (05/08/09 and 02/24/12) which I personally reviewed, showing mild atrophy in the bilateral frontal lobes. She had a PET scan in 2011 reported as normal. She underwent Neuropsychological evaluation with Dr. [name] in 2011 per records:  "Conclusion: Her history of a sharp decline in organizational ability and communication skills over the past few years, findings of bifrontal cortical atrophy on recent brain MRI and her distinctive expressive communication  difficulties (ie multiple word-finding pauses, problems with topic maintenance, difficulty getting to the point and perseveration/fixation to topic) would raise suspicion of frontal lobe dysfunction. While neuropsychological testing did not identify areas of cognitive impairment, persons with frontal lobe dysfunction, especially those with a relatively higher level of innate intelligence, often perform decently on the neuropsychological evaluation. Her current level of emotional distress seems far higher than her reported longtime baseline of what sounds like mild depression and generalized anxiety. While her dysphoria seems to result from her awareness of cognitive difficulties, it is possible that her heightened emotionality could also be a direct result of brain dysfunction. In any case, her ongoing emotional distress has been disrupting her focus and has potentiated her tendency to be fixated or perseverative, thereby negatively affecting her ability to plan and organize. Of course, if she has underlying cortical dysfunction, better control over the emotional symptoms would be unlikely to result in normalization of her organization and communication. Recommendation for continued psychological counseling and psychiatric services was recommended."  On review of Dr. [name] notes, her MOCA score was normal 28/30. He stated that she does not appear to the the disinhibition, apathy, or language disorder on exam which would be typical of frontotemporal dementia. He did a PET scan which showed decreased metabolic activity in regions of atrophy in the anterior frontal lobes, which confounds comparison of metabolism patterns with normal age-matched individuals. No pattern of hypometabolic changes to suggest a specific type of dementia on this exam. On her last visit with him in 10/2013, his assessment was Presumed frontotemporal dementia, serial imaging demonstrated frontal atrophy bilaterally. PET was inconclusive  due to pre-existing frontal atrophy. MOCA score stable at 28/30. He suggested  repeating Neuropsychological testing. They discussed that SSRIs may be helpful for behavioral abnormalities. She had been taking Fetzima at that time, and was switched to Pristiq because she was reporting different side effects. She has been seeing Dr. [name] for many years and reports trying different medications. Dr. [name] had noted that she has cognitive impairment caused by frontotemporal dementia and depression. Depression has been treatment-resistant. She has extreme profound inability to sustain attention, process information, modulate information. [name] reports that for the past few months, she had difficulty getting out of bed. She was tearful in the office today, stating she is "totally afraid to make commitments" and says "I have no purpose in life." She went from no desire to do anything, to extreme anxiety of having to do something. She asks about getting home therapy to help get her out of bed and go down to eat, otherwise she would stay in bed. Her fear is that she is getting worse and her children will see that this is how she is. She just wants to "give myself the best chance of getting my life back." She feels Pristiq does not seem to be working. She tried stimulants in the past but could not take them. She reports erratic sleep, she would be sleep-deprived and get her days and nights confused, other times she would sleep for 2 straight days. She was started on a new medication one time and became so confused with hallucinations, she went to the wrong dentist office.   She has a history of migraines occurring 5 days a week, with pain behind her eyes or radiating from her neck, with uncomfortable pressure behind her eyes. Relpax helps if she takes it at the onset, last intake was yesterday. If she wakes up with a migraine, she uses sumatriptan injections, last use was 3 days ago. She denies any diplopia, dysarthria,  dysphagia, focal numbness/tingling/weakness, bowel/bladder dysfunction. She has chronic neck and back pain.   Laboratory Data: Lab Results  Component Value Date   WBC 8.3 06/05/2014   HGB 14.4 06/05/2014   HCT 44.5 06/05/2014   MCV 92.3 06/05/2014   PLT 236 06/05/2014     Chemistry      Component Value Date/Time   NA 141 06/05/2014 1627   K 4.3 06/05/2014 1627   CL 100* 06/05/2014 1627   CO2 28 06/05/2014 1627   BUN 28* 06/05/2014 1627   CREATININE 0.96 06/05/2014 1627      Component Value Date/Time   CALCIUM 9.9 06/05/2014 1627   ALKPHOS 53 06/05/2014 1627   AST 60* 06/05/2014 1627   ALT 64* 06/05/2014 1627   BILITOT 0.3 06/05/2014 1627      PAST MEDICAL HISTORY: Past Medical History  Diagnosis Date  . Migraine, unspecified, without mention of intractable migraine without mention of status migrainosus   . Headache(784.0)   . Brachial neuritis or radiculitis NOS   . Essential and other specified forms of tremor   . Acute hepatitis C without mention of hepatic coma     No treatment  . Other specified disorders of rotator cuff syndrome of shoulder and allied disorders   . Depressive disorder, not elsewhere classified   . Memory loss     PAST SURGICAL HISTORY: Past Surgical History  Procedure Laterality Date  . Shoulder arthroscopy w/ rotator cuff repair Right 2004  . Shoulder arthroscopy w/ rotator cuff repair Left 2008  . Cesarean section      3x    MEDICATIONS: Current Outpatient Prescriptions  on File Prior to Visit  Medication Sig Dispense Refill  . ALPRAZolam (XANAX XR) 1 MG 24 hr tablet Take 1 mg by mouth daily as needed for anxiety.   0  . ALPRAZolam (XANAX) 1 MG tablet Take 1 mg by mouth 4 (four) times daily as needed for anxiety (shakes).    . Multiple Vitamins-Minerals (CENTRUM SILVER ADULT 50+) TABS Take 1 tablet by mouth daily.    Marland Kitchen PRISTIQ 100 MG 24 hr tablet Take 100 mg by mouth every morning.  2  . RELPAX 40 MG tablet Take 40 mg by mouth every 2  (two) hours as needed for migraine.     Marland Kitchen antipyrine-benzocaine (AURALGAN) otic solution Place 3-4 drops into both ears every 2 (two) hours as needed for ear pain.     . cyclobenzaprine (FLEXERIL) 10 MG tablet Take 1 tablet by mouth 2 (two) times daily as needed. Muscle spasms/headaches    . HYDROcodone-ibuprofen (VICOPROFEN) 7.5-200 MG per tablet Take 0.5-1 tablets by mouth 4 (four) times daily as needed for moderate pain or severe pain.     Marland Kitchen OVER THE COUNTER MEDICATION JOINT RELIEF    . SUMAtriptan 6 MG/0.5ML SOAJ Inject 6 mg into the skin daily as needed. For migranes    . VIIBRYD 40 MG TABS Take 1 tablet by mouth daily.  12   No current facility-administered medications on file prior to visit.    ALLERGIES: Allergies  Allergen Reactions  . Codeine Nausea And Vomiting and Other (See Comments)    Shuts down GI tract    FAMILY HISTORY: Family History  Problem Relation Age of Onset  . Stroke Father   . Diabetes Maternal Grandmother   . Headache Mother   . Other Son     Mother is deceased May 17, 2011 father moved to the area 2012-05-16.    SOCIAL HISTORY: Social History   Social History  . Marital Status: Divorced    Spouse Name: N/A  . Number of Children: N/A  . Years of Education: N/A   Occupational History  . Not on file.   Social History Main Topics  . Smoking status: Former Smoker    Types: Cigarettes    Quit date: 05/23/1978  . Smokeless tobacco: Never Used  . Alcohol Use: No  . Drug Use: No  . Sexual Activity: Not on file   Other Topics Concern  . 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 Dr. love.   She is gravida 3 para 3       REVIEW OF SYSTEMS: Constitutional: No fevers, chills, or sweats, + generalized fatigue, change in appetite Eyes: No visual changes, double vision,  eye pain Ear, nose and throat: No hearing loss, ear pain, nasal congestion, sore throat Cardiovascular: No chest pain, palpitations Respiratory:  No shortness of breath at rest or with exertion, wheezes GastrointestinaI: No nausea, vomiting, diarrhea, abdominal pain, fecal incontinence Genitourinary:  No dysuria, urinary retention or frequency Musculoskeletal:  + neck pain, back pain Integumentary: No rash, pruritus, skin lesions Neurological: as above Psychiatric: + depression, insomnia, anxiety Endocrine: No palpitations, +fatigue,no diaphoresis, mood swings, change in appetite, change in weight, increased thirst Hematologic/Lymphatic:  No anemia, purpura, petechiae. Allergic/Immunologic: no itchy/runny eyes, nasal congestion, recent allergic reactions, rashes  PHYSICAL EXAM: Filed Vitals:  10/08/14 1417  BP: 108/74  Pulse: 106  Resp: 16   General: No acute distress, becomes tearful several times during the visit Head:  Normocephalic/atraumatic Eyes: Fundoscopic exam shows bilateral sharp discs, no vessel changes, exudates, or hemorrhages Neck: supple, no paraspinal tenderness, full range of motion Back: No paraspinal tenderness Heart: regular rate and rhythm Lungs: Clear to auscultation bilaterally. Vascular: No carotid bruits. Skin/Extremities: No rash, no edema Neurological Exam: Mental status: alert and oriented to person, place, and time, no dysarthria or aphasia, Fund of knowledge is appropriate.  Recent and remote memory are intact.  Attention and concentration are normal.    Able to name objects and repeat phrases. Clock drawing test 5/5. MMSE - Mini Mental State Exam 10/08/2014  Orientation to time 4  Orientation to Place 5  Registration 3  Attention/ Calculation 5  Recall 3  Language- name 2 objects 2  Language- repeat 1  Language- follow 3 step command 3  Language- read & follow direction 1  Write a sentence 1  Copy design 1  Total score 29   Cranial  nerves: CN I: not tested CN II: pupils equal, round and reactive to light, visual fields intact, fundi unremarkable. CN III, IV, VI:  full range of motion, no nystagmus, no ptosis CN V: facial sensation intact CN VII: upper and lower face symmetric CN VIII: hearing intact to finger rub CN IX, X: gag intact, uvula midline CN XI: sternocleidomastoid and trapezius muscles intact CN XII: tongue midline Bulk & Tone: normal, no fasciculations. Motor: 5/5 throughout with no pronator drift. Sensation: intact to light touch, cold, pin, vibration and joint position sense.  No extinction to double simultaneous stimulation.  Romberg test negative Deep Tendon Reflexes: brisk +3 right UE with +Hoffman sign, brisk +2 left UE and both LE, no ankle clonus Plantar responses: downgoing bilaterally Cerebellar: no incoordination on finger to nose testing Gait: narrow-based and steady, mild difficulty with tandem walk but able Tremor: no resting tremor, +mild bilateral low amplitude high frequency postural and endpoint tremor  IMPRESSION: This is a 59 year old left-handed woman with a history of migraines, treatment-resistant depression, hepatitis C, presenting to establish care for a diagnosis of presumed frontotemporal dementia. She has bilateral frontal atrophy on MRI, however PET scans have been inconclusive due to pre-existing atrophy. Previous neuropsychological evaluation indicated frontal lobe dysfunction but no areas of cognitive impairment. Over the past few months, she has been having more and more difficulties motivating herself to get out of bed and eat, she has become more anxious. Her neurological exam is normal, MMSE today 29/30. Repeat Neuropsychological assessment was recommended by Dr. Conrad [place], which I agree with. We discussed however that the main issue at this time is her psychological condition affecting her daily living. She denies any suicidal ideation. She feels Pristiq is not working, and  was advised to discuss this with Dr. [name]. She sees a therapist and was advised to continue frequent visits. She asked about Xanax and her tremors, which appear to be enhanced physiologic tremor worsened by anxiety. We discussed dependence and tolerance potential with benzodiazepines, and discussing other options for anxiety with Dr. [name]. Continue with migraine treatment, she may benefit from daily headache prophylaxis in the future, SSRIs can be helpful with migraine prophylaxis as well. She will follow-up after Neuropsychological testing.   Thank you for allowing me to participate in the care of this patient. Please do not hesitate to call for any questions or concerns.   Patrcia Dolly,  M.D.  CC: Dr. Fabian Sharp, Dr. [name]

## 2014-10-14 ENCOUNTER — Ambulatory Visit (INDEPENDENT_AMBULATORY_CARE_PROVIDER_SITE_OTHER): Payer: 59 | Admitting: Internal Medicine

## 2014-10-14 ENCOUNTER — Encounter: Payer: Self-pay | Admitting: Internal Medicine

## 2014-10-14 VITALS — BP 118/84 | Temp 97.5°F | Wt 184.4 lb

## 2014-10-14 DIAGNOSIS — R51 Headache: Secondary | ICD-10-CM | POA: Diagnosis not present

## 2014-10-14 DIAGNOSIS — R519 Headache, unspecified: Secondary | ICD-10-CM

## 2014-10-14 DIAGNOSIS — F411 Generalized anxiety disorder: Secondary | ICD-10-CM

## 2014-10-14 DIAGNOSIS — Z23 Encounter for immunization: Secondary | ICD-10-CM

## 2014-10-14 DIAGNOSIS — F321 Major depressive disorder, single episode, moderate: Secondary | ICD-10-CM

## 2014-10-14 DIAGNOSIS — K130 Diseases of lips: Secondary | ICD-10-CM

## 2014-10-14 DIAGNOSIS — R413 Other amnesia: Secondary | ICD-10-CM | POA: Diagnosis not present

## 2014-10-14 DIAGNOSIS — Z79899 Other long term (current) drug therapy: Secondary | ICD-10-CM

## 2014-10-14 DIAGNOSIS — M5030 Other cervical disc degeneration, unspecified cervical region: Secondary | ICD-10-CM

## 2014-10-14 NOTE — Progress Notes (Signed)
Pre visit review using our clinic review tool, if applicable. No additional management support is needed unless otherwise documented below in the visit note.  Chief Complaint  Patient presents with  . Follow-up    HPI: Toni [name] [facility] 59 y.o. seen for first time  Last visit with concern mostlyu about memory and causes of such problems. Previous PCP Dr. Yehuda Budd.    Since then ahs seen dr Karel Jarvis see advice and sees Dr [name] for meds .  See note from Dr. Karel Jarvis. To get further testing or repeat neuropsychological. Felt to have a great bit of anxiety causing some of her symptoms. She comes in today wanting to show me how she shakes when she doesn't take her Xanax on time. Takes it in the room after she shows me some tremors.  Still has a problem getting out of bed  As far as HCM goes :  Had colonoscopy Dr. Madilyn Fireman woke up from the anesthesia had severe pain hesitant to go through that again no blood in stool no diagnosed family history however Mom  Died   Had copd and had mass  In lung and abdomen.  .   Had mass.  Interested in the stool test however may not want to tackle it right now.  Pap smears   :   Now has relationship.     Has fibroid cysts and  Had pap last . Parent Nedra Hai has had yearly Paps have been normal did have a cervical polyp that was benign. Her last Pap smear she reports was a year ago normal.Had vaginal  [place] 10 years ago.    Shingles vaccine  Not don't yet. Was given a prescription the past just hasn't gotten around to it  Hep  C screen positive when tried to donate blood  [place].  Almost 20 years ago  lfts have been ok. And she's been monitoring them and not interested in doing further treatment at this time. She may have had viral copies done at some point.  Not interested in pursuing this at this time. She has headaches radiating from her neck and takes Relpax up to every other day or 30 month acid we can take over her Relpax.  She states she is fully aware  rebound headaches "" Seeing psychiatrist changing medications around continuing with her counselor   [place].  ROS: See pertinent positives and negatives per HPI. A chagrin that most days she can get out of bed. And she used to be able to do much more. Said she had Pneumovax or a pneumonia vaccine and flu vaccine last year. She states she has a history of recurrent pneumonia but no history of immunosuppression or cardiac disease it is active. Needs flu vaccine today.  Past Medical History  Diagnosis Date  . Migraine, unspecified, without mention of intractable migraine without mention of status migrainosus   . Headache(784.0)   . Brachial neuritis or radiculitis NOS   . Essential and other specified forms of tremor   . Acute hepatitis C without mention of hepatic coma     No treatment  . Other specified disorders of rotator cuff syndrome of shoulder and allied disorders   . Depressive disorder, not elsewhere classified   . Memory loss   . Microscopic hematuria     seed dr Patsi Sears in past  . Vulvar pain     rx with valium supp    Family History  Problem Relation Age of Onset  . Stroke Father   .  Diabetes Maternal Grandmother   . Headache Mother   . Other Son     Mother is deceased 05/21/2011 father moved to the area 05-20-12.    Social History   Social History  . Marital Status: Divorced    Spouse Name: N/A  . Number of Children: 3  . Years of Education: N/A   Social History Main Topics  . Smoking status: Former Smoker    Types: Cigarettes    Quit date: 05/23/1978  . Smokeless tobacco: Never Used  . Alcohol Use: No  . Drug Use: No  . Sexual Activity: Not Asked   Other Topics Concern  . 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 Dr. love.   She is gravida 3 para 3       Outpatient  Prescriptions Prior to Visit  Medication Sig Dispense Refill  . ALPRAZolam (XANAX) 1 MG tablet Take 1 mg by mouth 4 (four) times daily as needed for anxiety (shakes).    . cyclobenzaprine (FLEXERIL) 10 MG tablet Take 1 tablet by mouth 2 (two) times daily as needed. Muscle spasms/headaches    . HYDROcodone-ibuprofen (VICOPROFEN) 7.5-200 MG per tablet Take 0.5-1 tablets by mouth 4 (four) times daily as needed for moderate pain or severe pain.     . Misc Natural Products (ESTROVEN + ENERGY MAX STRENGTH) TABS Take by mouth daily.    . Multiple Vitamins-Minerals (CENTRUM SILVER ADULT 50+) TABS Take 1 tablet by mouth daily.    . RELPAX 40 MG tablet Take 40 mg by mouth every 2 (two) hours as needed for migraine.     . SUMAtriptan 6 MG/0.5ML SOAJ Inject 6 mg into the skin daily as needed. For migranes    . ALPRAZolam (XANAX XR) 1 MG 24 hr tablet Take 1 mg by mouth daily as needed for anxiety.   0  . PRISTIQ 100 MG 24 hr tablet Take 100 mg by mouth every morning.  2   No facility-administered medications prior to visit.     EXAM:  BP 118/84 mmHg  Temp(Src) 97.5 F (36.4 C) (Oral)  Wt 184 lb 6.4 oz (83.643 kg)  Body mass index is 33.72 kg/(m^2).  GENERAL: vitals reviewed and listed above, alert, oriented, appears well hydrated and in no acute distress she looks very anxious verbal speaks complete sentences without dysarthria. She has a tremor that she shows me before she takes her Xanax. HEENT: atraumatic, conjunctiva  clear, no obvious abnormalities on inspection of external nose and ears MS: moves all extremities without noticeable focal  abnormality the corner of her right mouth there is a small perperliche right  oral pharynx is clear with no obvious lesions. PSYCH:   Cooperative verbal anxious and worried.  Nl conversation otherwise    ASSESSMENT AND PLAN:  Discussed the following assessment and plan:  Memory difficulties  Perleche - Topical treatment discussed  Anxiety  state  Persistent headaches - See text she says felt secondary to her C-spine disease triptan use very frequent. See text  Need for prophylactic vaccination and inoculation against influenza - Plan: Flu Vaccine QUAD 36+ mos PF IM (Fluarix & Fluzone Quad PF)  Major depressive disorder, single episode, moderate (HCC)  Degenerative disc disease, cervical  Medication management  High risk medication use  Agreed to take  over patient's Relpax but not at 30 pills a month as this seems to high a dose and has risk concern about rebound headaches. She states that most of her headaches come from her cervical spine disorder. Plan CPX labs and physical exam in 1-2 months. She should continue with her specialists. We'll try to keep updated on her healthcare maintenance needs. She doesn't seem high risk GYN and can go to every 3-5 years. She will get her an micrograms mammogram.  We went over it healthcare maintenance preventive and screening risk-benefit what she wants to proceed with an not this took a good and on a time but we made a plan. Thurston Hole can readdress at her follow-up visits. Agree that she should proceed with help with her anxiety and neuropsychological evaluation.  Total visit 40 mins > 50% spent counseling and coordinating care as indicated in above note and in instructions to patient .     -Patient advised to return or notify health care team  if symptoms worsen ,persist or new concerns arise. .  Patient Instructions  Can do the stool .  IFOB  for screening .    Flu vaccine  Today  Mammogram   [facility] or the [facility] center.    Check into shingles vaccine ( Zostavax) reimbursement or cost to you  and can return at any time if call ahead for injection.  For the perleche   otc anti yeast  Cream   Miconazole  Twice a day and keep    Dry  Can also add polysporin topical antibacterial  .    Should get better in 1-2 weeks.   Plan  Labs  In 1-2 months   cpx  Routine to include  Liver  blood count and thryoid blood sugar.  And then  cpx Wellness visit         Neta Mends. Dianara Smullen M.D.

## 2014-10-14 NOTE — Patient Instructions (Signed)
Can do the stool .  IFOB  for screening .    Flu vaccine  Today  Mammogram   [facility] or the breast center.    Check into shingles vaccine ( Zostavax) reimbursement or cost to you  and can return at any time if call ahead for injection.  For the perleche   otc anti yeast  Cream   Miconazole  Twice a day and keep    Dry  Can also add polysporin topical antibacterial  .    Should get better in 1-2 weeks.   Plan  Labs  In 1-2 months   cpx  Routine to include  Liver blood count and thryoid blood sugar.  And then  cpx Wellness visit

## 2014-10-20 ENCOUNTER — Telehealth: Payer: Self-pay | Admitting: Internal Medicine

## 2014-10-20 NOTE — Telephone Encounter (Signed)
Pt needs to reschedule her cpx on 12/5-16 due to having test [place]. Pt is having memory issues. Can I create 30 min slot?

## 2014-10-20 NOTE — Telephone Encounter (Signed)
Please reschedule

## 2014-10-20 NOTE — Telephone Encounter (Signed)
Pt has been reschedule.

## 2014-10-28 ENCOUNTER — Telehealth: Payer: Self-pay | Admitting: Internal Medicine

## 2014-10-28 ENCOUNTER — Other Ambulatory Visit: Payer: Self-pay | Admitting: Family Medicine

## 2014-10-28 DIAGNOSIS — Z0000 Encounter for general adult medical examination without abnormal findings: Secondary | ICD-10-CM

## 2014-10-28 DIAGNOSIS — R413 Other amnesia: Secondary | ICD-10-CM

## 2014-10-28 NOTE — Telephone Encounter (Signed)
Pt notified orders have been placed.

## 2014-10-28 NOTE — Telephone Encounter (Signed)
Patient's neurologist requests TSH for her labs, and pt requests an LFT done as well.  She wants to have these done when she gets her cpx labs done.

## 2014-10-28 NOTE — Telephone Encounter (Signed)
tsh and lfts should be included  her cpx labs  But please add  a free t4

## 2014-12-02 ENCOUNTER — Telehealth: Payer: Self-pay | Admitting: Family Medicine

## 2014-12-02 ENCOUNTER — Other Ambulatory Visit (INDEPENDENT_AMBULATORY_CARE_PROVIDER_SITE_OTHER): Payer: 59

## 2014-12-02 DIAGNOSIS — R413 Other amnesia: Secondary | ICD-10-CM

## 2014-12-02 DIAGNOSIS — Z8619 Personal history of other infectious and parasitic diseases: Secondary | ICD-10-CM

## 2014-12-02 DIAGNOSIS — Z0000 Encounter for general adult medical examination without abnormal findings: Secondary | ICD-10-CM | POA: Diagnosis not present

## 2014-12-02 LAB — CBC WITH DIFFERENTIAL/PLATELET
BASOS ABS: 0 10*3/uL (ref 0.0–0.1)
Basophils Relative: 0.6 % (ref 0.0–3.0)
EOS ABS: 0.1 10*3/uL (ref 0.0–0.7)
Eosinophils Relative: 1.7 % (ref 0.0–5.0)
HEMATOCRIT: 44.7 % (ref 36.0–46.0)
Hemoglobin: 14.7 g/dL (ref 12.0–15.0)
LYMPHS PCT: 42.6 % (ref 12.0–46.0)
Lymphs Abs: 2.6 10*3/uL (ref 0.7–4.0)
MCHC: 33 g/dL (ref 30.0–36.0)
MCV: 89.6 fl (ref 78.0–100.0)
MONOS PCT: 9.5 % (ref 3.0–12.0)
Monocytes Absolute: 0.6 10*3/uL (ref 0.1–1.0)
NEUTROS PCT: 45.6 % (ref 43.0–77.0)
Neutro Abs: 2.8 10*3/uL (ref 1.4–7.7)
PLATELETS: 224 10*3/uL (ref 150.0–400.0)
RBC: 4.99 Mil/uL (ref 3.87–5.11)
RDW: 13 % (ref 11.5–15.5)
WBC: 6.2 10*3/uL (ref 4.0–10.5)

## 2014-12-02 LAB — HEPATIC FUNCTION PANEL
ALBUMIN: 4.2 g/dL (ref 3.5–5.2)
ALT: 31 U/L (ref 0–35)
AST: 30 U/L (ref 0–37)
Alkaline Phosphatase: 54 U/L (ref 39–117)
BILIRUBIN TOTAL: 0.5 mg/dL (ref 0.2–1.2)
Bilirubin, Direct: 0.1 mg/dL (ref 0.0–0.3)
Total Protein: 7.4 g/dL (ref 6.0–8.3)

## 2014-12-02 LAB — LIPID PANEL
CHOL/HDL RATIO: 4
Cholesterol: 209 mg/dL — ABNORMAL HIGH (ref 0–200)
HDL: 46.6 mg/dL (ref >39.00)
LDL CALC: 144 mg/dL — AB (ref 0–99)
NONHDL: 162.79
Triglycerides: 95 mg/dL (ref 0.0–149.0)
VLDL: 19 mg/dL (ref 0.0–40.0)

## 2014-12-02 LAB — BASIC METABOLIC PANEL
BUN: 21 mg/dL (ref 6–23)
CALCIUM: 9.4 mg/dL (ref 8.4–10.5)
CO2: 27 mEq/L (ref 19–32)
CREATININE: 0.82 mg/dL (ref 0.40–1.20)
Chloride: 102 mEq/L (ref 96–112)
GFR: 75.29 mL/min (ref >60.00)
Glucose, Bld: 97 mg/dL (ref 70–99)
Potassium: 4 mEq/L (ref 3.5–5.1)
SODIUM: 138 meq/L (ref 135–145)

## 2014-12-02 LAB — T4, FREE: FREE T4: 0.86 ng/dL (ref 0.60–1.60)

## 2014-12-02 LAB — TSH: TSH: 6.01 u[IU]/mL — AB (ref 0.35–4.50)

## 2014-12-02 NOTE — Addendum Note (Signed)
Addended by: Raj JanusADKINS, Shannara Winbush T on: 12/02/2014 01:03 PM   Modules accepted: Orders

## 2014-12-02 NOTE — Telephone Encounter (Signed)
This was hepatitis c   And we would have done viral copies if positive  Screen   See last note  "  Hep C screen positive when tried to donate blood [facility]. Almost 20 years ago lfts have been ok. And she's been monitoring them and not interested in doing further treatment at this time. She may have had viral copies done at some point.  Not interested in pursuing this at this time."  Please add on hep c screen and hep b aby and hep b ag and  Hep b core antibody

## 2014-12-02 NOTE — Telephone Encounter (Signed)
Pt seen in lab today.  Requesting hepatitis screen.  Please advise.  Thanks!

## 2014-12-02 NOTE — Telephone Encounter (Signed)
The orders are in for this patient.

## 2014-12-03 LAB — HEPATITIS B SURFACE ANTIGEN: Hepatitis B Surface Ag: NEGATIVE

## 2014-12-03 LAB — HEPATITIS C ANTIBODY: HCV AB: REACTIVE — AB

## 2014-12-03 LAB — HEPATITIS B SURFACE ANTIBODY,QUALITATIVE: Hep B S Ab: NEGATIVE

## 2014-12-03 LAB — HEPATITIS B CORE ANTIBODY, TOTAL: HEP B C TOTAL AB: NONREACTIVE

## 2014-12-04 LAB — HEPATITIS C RNA QUANTITATIVE
HCV QUANT LOG: 6.64 {Log} — AB (ref <1.18)
HCV QUANT: 4385930 [IU]/mL — AB (ref <15)

## 2014-12-08 ENCOUNTER — Other Ambulatory Visit: Payer: Self-pay | Admitting: Family Medicine

## 2014-12-08 ENCOUNTER — Encounter: Payer: 59 | Admitting: Internal Medicine

## 2014-12-08 DIAGNOSIS — R768 Other specified abnormal immunological findings in serum: Secondary | ICD-10-CM

## 2014-12-15 ENCOUNTER — Encounter: Payer: Self-pay | Admitting: Internal Medicine

## 2014-12-15 ENCOUNTER — Ambulatory Visit (INDEPENDENT_AMBULATORY_CARE_PROVIDER_SITE_OTHER): Payer: 59 | Admitting: Internal Medicine

## 2014-12-15 VITALS — BP 124/86 | Temp 98.0°F | Ht 62.0 in | Wt 188.3 lb

## 2014-12-15 DIAGNOSIS — R413 Other amnesia: Secondary | ICD-10-CM | POA: Diagnosis not present

## 2014-12-15 DIAGNOSIS — Z0000 Encounter for general adult medical examination without abnormal findings: Secondary | ICD-10-CM | POA: Diagnosis not present

## 2014-12-15 DIAGNOSIS — R519 Headache, unspecified: Secondary | ICD-10-CM

## 2014-12-15 DIAGNOSIS — E039 Hypothyroidism, unspecified: Secondary | ICD-10-CM

## 2014-12-15 DIAGNOSIS — B1920 Unspecified viral hepatitis C without hepatic coma: Secondary | ICD-10-CM | POA: Diagnosis not present

## 2014-12-15 DIAGNOSIS — Z23 Encounter for immunization: Secondary | ICD-10-CM

## 2014-12-15 DIAGNOSIS — E038 Other specified hypothyroidism: Secondary | ICD-10-CM

## 2014-12-15 DIAGNOSIS — Z1211 Encounter for screening for malignant neoplasm of colon: Secondary | ICD-10-CM | POA: Diagnosis not present

## 2014-12-15 DIAGNOSIS — R51 Headache: Secondary | ICD-10-CM

## 2014-12-15 NOTE — Progress Notes (Signed)
Pre visit review using our clinic review tool, if applicable. No additional management support is needed unless otherwise documented below in the visit note.  Chief Complaint  Patient presents with  . Annual Exam    meds for has  under eval for memory    HPI: Patient  Toni [name]  59 y.o. comes in today for Preventive Health Care visit  May be getting request for med  relpax    For migraines  .  ocass neck pain  Stimulating migraine  .  Sending  Refill   Headaches      4- 5 per  Week.  Getting 30 relpax per month  2 per day for a ha  per  Month  No more than 2 per day. On med for 10 years .  Sometimes wakes up with them   Eatingis a trigger .  Cannot take meds suppressive that cause  "Tremor and m\memory issues "  Not on xanax today see tremor.   Had neuo cog testing p sullivan  Report not back . Beth sullivan   To fu with dr A   Has seen dr Sherwood Gambler  about [facility] to get repeat x ray in 9 months.  Sees pain management with them .   Health Maintenance  Topic Date Due  . TETANUS/TDAP  10/03/1972  . COLONOSCOPY  12/15/2015 (Originally 01/03/2014)  . HIV Screening  12/15/2015 (Originally 10[birth year]/1968)  . INFLUENZA VACCINE  08/04/2015  . PAP SMEAR  10/04/2015  . MAMMOGRAM  10/16/2015  . ZOSTAVAX  Addressed  . Hepatitis C Screening  Completed   Health Maintenance Review LIFESTYLE:  Exercise:   No reg exercising  Tobacco/ETS: no  Alcohol:   1 wevery 3 -4 month s Sugar beverages:  Rare  Drug use: no See meds   ROS:  GEN/ HEENT: No fever, significant weight changes sweats _0 vision problems hearing changes, CV/ PULM; No chest pain shortness of breath cough, syncope,edema  change in exercise tolerance. GI /GU: No adominal pain, vomiting, change in bowel habits. No blood in the stool. No significant GU symptoms. SKIN/HEME: ,no acute skin rashes suspicious lesions or bleeding. No lymphadenopathy, nodules, masses.  NEURO/ PSYCH:  No neurologic signs such as weakness  numbness.  Tremors    Off alprazolam No depression anxiety. IMM/ Allergy: No unusual infections.  Allergy .   REST of 12 system review negative except as per HPI   Past Medical History  Diagnosis Date  . Migraine, unspecified, without mention of intractable migraine without mention of status migrainosus   . Headache(784.0)   . Brachial neuritis or radiculitis NOS   . Essential and other specified forms of tremor   . Acute hepatitis C without mention of hepatic coma     No treatment  . Other specified disorders of rotator cuff syndrome of shoulder and allied disorders   . Depressive disorder, not elsewhere classified   . Memory loss   . Microscopic hematuria     seed dr Gaynelle Arabian in past  . Vulvar pain     rx with valium supp    Past Surgical History  Procedure Laterality Date  . Shoulder arthroscopy w/ rotator cuff repair Right 2004  . Shoulder arthroscopy w/ rotator cuff repair Left 2008  . Cesarean section      x3    Family History  Problem Relation Age of Onset  . Stroke Father   . Diabetes Maternal Grandmother   . Headache Mother   . Other Son  Mother is deceased 2013 father moved to the area 2014.    Social History   Social History  . Marital Status: Divorced    Spouse Name: N/A  . Number of Children: 3  . Years of Education: N/A   Social History Main Topics  . Smoking status: Former Smoker    Types: Cigarettes    Quit date: 05/23/1978  . Smokeless tobacco: Never Used  . Alcohol Use: No  . Drug Use: No  . Sexual Activity: Not Asked   Other Topics Concern  . None   Social History Narrative   Does not sleep well at night   Lives alone   Has two cats   On Manufacturing engineer previous worked for [employer] On disability for depression and short-term memory problems. Dr. love.   She is gravida 3 para 3       Outpatient Prescriptions Prior to Visit  Medication Sig Dispense Refill  . ALPRAZolam (XANAX) 1 MG tablet Take 1 mg by  mouth 4 (four) times daily as needed for anxiety (shakes).    . cyclobenzaprine (FLEXERIL) 10 MG tablet Take 1 tablet by mouth 2 (two) times daily as needed. Muscle spasms/headaches    . desvenlafaxine (PRISTIQ) 50 MG 24 hr tablet Take 50 mg by mouth daily.    Marland Kitchen FLUoxetine (PROZAC) 20 MG capsule Take 1 capsule by mouth daily.  0  . HYDROcodone-ibuprofen (VICOPROFEN) 7.5-200 MG per tablet Take 0.5-1 tablets by mouth 4 (four) times daily as needed for moderate pain or severe pain.     . Misc Natural Products (ESTROVEN + ENERGY MAX STRENGTH) TABS Take by mouth daily.    . Multiple Vitamins-Minerals (CENTRUM SILVER ADULT 50+) TABS Take 1 tablet by mouth daily.    . RELPAX 40 MG tablet Take 40 mg by mouth every 2 (two) hours as needed for migraine.     . SUMAtriptan 6 MG/0.5ML SOAJ Inject 6 mg into the skin daily as needed. For migranes     No facility-administered medications prior to visit.     EXAM:  BP 124/86 mmHg  Temp(Src) 98 F (36.7 C) (Oral)  Ht 5' 2" (1.575 m)  Wt 188 lb 4.8 oz (85.412 kg)  BMI 34.43 kg/m2  Body mass index is 34.43 kg/(m^2).  Physical Exam: Vital signs reviewed YKD:XIPJ is a well-developed well-nourished alert cooperative    who appearsr stated age in no acute distress.  Mild anxiety  HEENT: normocephalic atraumatic , Eyes: PERRL EOM's full, conjunctiva clear, Nares: paten,t no deformity discharge or tenderness., Ears: no deformity EAC's clear TMs with normal landmarks. Mouth: clear OP, no lesions, edema.  Moist mucous membranes. Dentition in adequate repair. NECK: supple without masses, thyromegaly or bruits. CHEST/PULM:  Clear to auscultation and percussion breath sounds equal no wheeze , rales or rhonchi. No chest wall deformities or tenderness.Breast: normal by inspection . No dimpling, discharge, masses, tenderness or discharge . CV: PMI is nondisplaced, S1 S2 no gallops, murmurs, rubs. Peripheral pulses are full without delay.No JVD .  ABDOMEN: Bowel sounds  normal nontender  No guard or rebound,   Hepar a? At rcm mild tender  Cannot tell spleen no CVA tenderness.  No hernia. Extremtities:  No clubbing cyanosis or edema, no acute joint swelling or redness no focal atrophy NEURO:  Oriented x3, cranial nerves 3-12 appear to be intact, no obvious focal weakness,gait within normal limits no abnormal reflexes or asymmetrical minimal tremor  SKIN: No acute rashes normal turgor, color,  no bruising or petechiae. PSYCH: Oriented, good eye contact, no obvious depression anxiety,   Tearful emotional at times  LN: no cervical axillary inguinal adenopathy  Lab Results  Component Value Date   WBC 6.2 12/02/2014   HGB 14.7 12/02/2014   HCT 44.7 12/02/2014   PLT 224.0 12/02/2014   GLUCOSE 97 12/02/2014   CHOL 209* 12/02/2014   TRIG 95.0 12/02/2014   HDL 46.60 12/02/2014   LDLCALC 144* 12/02/2014   ALT 31 12/02/2014   AST 30 12/02/2014   NA 138 12/02/2014   K 4.0 12/02/2014   CL 102 12/02/2014   CREATININE 0.82 12/02/2014   BUN 21 12/02/2014   CO2 27 12/02/2014   TSH 6.01* 12/02/2014    ASSESSMENT AND PLAN:  Discussed the following assessment and plan:  Visit for preventive health examination - can do   ifob this year   Subclinical hypothyroidism  Hepatitis C virus infection without hepatic coma, unspecified chronicity  Colon cancer screening - Plan: Fecal occult blood, imunochemical  Memory difficulties  Persistent headaches - see text can refill relpax when needed but would avoid frequent use   Need for hepatitis A and B vaccination - Plan: Hepatitis A hepatitis B combined vaccine IM  subclinincal hypothyroid   Consider adding med based on her sx  At least repeat  She has hep c now allowed definition  Pt prefers no rx at this time   Under other eval  Disc  Told her a risk factor for cancer  Cirrhosis etc .  Pt aware  Consider [place] future  Will need twin rix  Not looking to colonoscopy as tolerant to sedatives   Will do ifob this year and  decide later on further screen  Patient Care Team: Burnis Medin, MD as PCP - General (Internal Medicine) Jovita Gamma, MD as Consulting Physician (Neurosurgery) Carolan Clines, MD as Consulting Physician (Urology) Chucky May, MD as Consulting Physician (Psychiatry) Patient Instructions  Advise twin rix  Hep A and B series .  Will refill relpax    Taking too frequently can cause increase sx . Will evaluate at we go . I agree avoid meds that cause memory dysfunction.  Thyroid is borderline off  And should be repeat    In about a month  Consideration of intervention if  stiool abnormal .  Colon cancer  Screening  Advised . Of some sort.  ifob .  Reconsider at some time for  evaluation and treatment for hep C.      Health Maintenance, Female Adopting a healthy lifestyle and getting preventive care can go a long way to promote health and wellness. Talk with your health care provider about what schedule of regular examinations is right for you. This is a good chance for you to check in with your provider about disease prevention and staying healthy. In between checkups, there are plenty of things you can do on your own. Experts have done a lot of research about which lifestyle changes and preventive measures are most likely to keep you healthy. Ask your health care provider for more information. WEIGHT AND DIET  Eat a healthy diet  Be sure to include plenty of vegetables, fruits, low-fat dairy products, and lean protein.  Do not eat a lot of foods high in solid fats, added sugars, or salt.  Get regular exercise. This is one of the most important things you can do for your health.  Most adults should exercise for at least 150 minutes each week.  The exercise should increase your heart rate and make you sweat (moderate-intensity exercise).  Most adults should also do strengthening exercises at least twice a week. This is in addition to the moderate-intensity exercise.  Maintain a  healthy weight  Body mass index (BMI) is a measurement that can be used to identify possible weight problems. It estimates body fat based on height and weight. Your health care provider can help determine your BMI and help you achieve or maintain a healthy weight.  For females 36 years of age and older:   A BMI below 18.5 is considered underweight.  A BMI of 18.5 to 24.9 is normal.  A BMI of 25 to 29.9 is considered overweight.  A BMI of 30 and above is considered obese.  Watch levels of cholesterol and blood lipids  You should start having your blood tested for lipids and cholesterol at 61 years of age, then have this test every 5 years.  You may need to have your cholesterol levels checked more often if:  Your lipid or cholesterol levels are high.  You are older than 61 years of age.  You are at high risk for heart disease.  CANCER SCREENING   Lung Cancer  Lung cancer screening is recommended for adults 76-50 years old who are at high risk for lung cancer because of a history of smoking.  A yearly low-dose CT scan of the lungs is recommended for people who:  Currently smoke.  Have quit within the past 15 years.  Have at least a 30-pack-year history of smoking. A pack year is smoking an average of one pack of cigarettes a day for 1 year.  Yearly screening should continue until it has been 15 years since you quit.  Yearly screening should stop if you develop a health problem that would prevent you from having lung cancer treatment.  Breast Cancer  Practice breast self-awareness. This means understanding how your breasts normally appear and feel.  It also means doing regular breast self-exams. Let your health care provider know about any changes, no matter how small.  If you are in your 20s or 30s, you should have a clinical breast exam (CBE) by a health care provider every 1-3 years as part of a regular health exam.  If you are 70 or older, have a CBE every year.  Also consider having a breast X-ray (mammogram) every year.  If you have a family history of breast cancer, talk to your health care provider about genetic screening.  If you are at high risk for breast cancer, talk to your health care provider about having an MRI and a mammogram every year.  Breast cancer gene (BRCA) assessment is recommended for women who have family members with BRCA-related cancers. BRCA-related cancers include:  Breast.  Ovarian.  Tubal.  Peritoneal cancers.  Results of the assessment will determine the need for genetic counseling and BRCA1 and BRCA2 testing. Cervical Cancer Your health care provider may recommend that you be screened regularly for cancer of the pelvic organs (ovaries, uterus, and vagina). This screening involves a pelvic examination, including checking for microscopic changes to the surface of your cervix (Pap test). You may be encouraged to have this screening done every 3 years, beginning at age 20.  For women ages 94-65, health care providers may recommend pelvic exams and Pap testing every 3 years, or they may recommend the Pap and pelvic exam, combined with testing for human papilloma virus (HPV), every 5 years. Some types of  HPV increase your risk of cervical cancer. Testing for HPV may also be done on women of any age with unclear Pap test results.  Other health care providers may not recommend any screening for nonpregnant women who are considered low risk for pelvic cancer and who do not have symptoms. Ask your health care provider if a screening pelvic exam is right for you.  If you have had past treatment for cervical cancer or a condition that could lead to cancer, you need Pap tests and screening for cancer for at least 20 years after your treatment. If Pap tests have been discontinued, your risk factors (such as having a new sexual partner) need to be reassessed to determine if screening should resume. Some women have medical problems that  increase the chance of getting cervical cancer. In these cases, your health care provider may recommend more frequent screening and Pap tests. Colorectal Cancer  This type of cancer can be detected and often prevented.  Routine colorectal cancer screening usually begins at 61 years of age and continues through 61 years of age.  Your health care provider may recommend screening at an earlier age if you have risk factors for colon cancer.  Your health care provider may also recommend using home test kits to check for hidden blood in the stool.  A small camera at the end of a tube can be used to examine your colon directly (sigmoidoscopy or colonoscopy). This is done to check for the earliest forms of colorectal cancer.  Routine screening usually begins at age 39.  Direct examination of the colon should be repeated every 5-10 years through 61 years of age. However, you may need to be screened more often if early forms of precancerous polyps or small growths are found. Skin Cancer  Check your skin from head to toe regularly.  Tell your health care provider about any new moles or changes in moles, especially if there is a change in a mole's shape or color.  Also tell your health care provider if you have a mole that is larger than the size of a pencil eraser.  Always use sunscreen. Apply sunscreen liberally and repeatedly throughout the day.  Protect yourself by wearing long sleeves, pants, a wide-brimmed hat, and sunglasses whenever you are outside. HEART DISEASE, DIABETES, AND HIGH BLOOD PRESSURE   High blood pressure causes heart disease and increases the risk of stroke. High blood pressure is more likely to develop in:  People who have blood pressure in the high end of the normal range (130-139/85-89 mm Hg).  People who are overweight or obese.  People who are African American.  If you are 76-37 years of age, have your blood pressure checked every 3-5 years. If you are 38 years of  age or older, have your blood pressure checked every year. You should have your blood pressure measured twice--once when you are at a hospital or clinic, and once when you are not at a hospital or clinic. Record the average of the two measurements. To check your blood pressure when you are not at a hospital or clinic, you can use:  An automated blood pressure machine at a pharmacy.  A home blood pressure monitor.  If you are between 72 years and 52 years old, ask your health care provider if you should take aspirin to prevent strokes.  Have regular diabetes screenings. This involves taking a blood sample to check your fasting blood sugar level.  If you are at a normal  weight and have a low risk for diabetes, have this test once every three years after 61 years of age.  If you are overweight and have a high risk for diabetes, consider being tested at a younger age or more often. PREVENTING INFECTION  Hepatitis B  If you have a higher risk for hepatitis B, you should be screened for this virus. You are considered at high risk for hepatitis B if:  You were born in a country where hepatitis B is common. Ask your health care provider which countries are considered high risk.  Your parents were born in a high-risk country, and you have not been immunized against hepatitis B (hepatitis B vaccine).  You have HIV or AIDS.  You use needles to inject street drugs.  You live with someone who has hepatitis B.  You have had sex with someone who has hepatitis B.  You get hemodialysis treatment.  You take certain medicines for conditions, including cancer, organ transplantation, and autoimmune conditions. Hepatitis C  Blood testing is recommended for:  Everyone born from [birth year] through 1967.  Anyone with known risk factors for hepatitis C. Sexually transmitted infections (STIs)  You should be screened for sexually transmitted infections (STIs) including gonorrhea and chlamydia if:  You are  sexually active and are younger than 61 years of age.  You are older than 61 years of age and your health care provider tells you that you are at risk for this type of infection.  Your sexual activity has changed since you were last screened and you are at an increased risk for chlamydia or gonorrhea. Ask your health care provider if you are at risk.  If you do not have HIV, but are at risk, it may be recommended that you take a prescription medicine daily to prevent HIV infection. This is called pre-exposure prophylaxis (PrEP). You are considered at risk if:  You are sexually active and do not regularly use condoms or know the HIV status of your partner(s).  You take drugs by injection.  You are sexually active with a partner who has HIV. Talk with your health care provider about whether you are at high risk of being infected with HIV. If you choose to begin PrEP, you should first be tested for HIV. You should then be tested every 3 months for as long as you are taking PrEP.  PREGNANCY   If you are premenopausal and you may become pregnant, ask your health care provider about preconception counseling.  If you may become pregnant, take 400 to 800 micrograms (mcg) of folic acid every day.  If you want to prevent pregnancy, talk to your health care provider about birth control (contraception). OSTEOPOROSIS AND MENOPAUSE   Osteoporosis is a disease in which the bones lose minerals and strength with aging. This can result in serious bone fractures. Your risk for osteoporosis can be identified using a bone density scan.  If you are 31 years of age or older, or if you are at risk for osteoporosis and fractures, ask your health care provider if you should be screened.  Ask your health care provider whether you should take a calcium or vitamin D supplement to lower your risk for osteoporosis.  Menopause may have certain physical symptoms and risks.  Hormone replacement therapy may reduce some  of these symptoms and risks. Talk to your health care provider about whether hormone replacement therapy is right for you.  HOME CARE INSTRUCTIONS   Schedule regular health, dental, and  eye exams.  Stay current with your immunizations.   Do not use any tobacco products including cigarettes, chewing tobacco, or electronic cigarettes.  If you are pregnant, do not drink alcohol.  If you are breastfeeding, limit how much and how often you drink alcohol.  Limit alcohol intake to no more than 1 drink per day for nonpregnant women. One drink equals 12 ounces of beer, 5 ounces of wine, or 1 ounces of hard liquor.  Do not use street drugs.  Do not share needles.  Ask your health care provider for help if you need support or information about quitting drugs.  Tell your health care provider if you often feel depressed.  Tell your health care provider if you have ever been abused or do not feel safe at home.   This information is not intended to replace advice given to you by your health care provider. Make sure you discuss any questions you have with your health care provider.   Document Released: 07/05/2010 Document Revised: 01/10/2014 Document Reviewed: 11/21/2012 Elsevier Interactive Patient Education 2016 Valatie K. Malaiya Paczkowski M.D.

## 2014-12-15 NOTE — Patient Instructions (Addendum)
Advise twin rix  Hep A and B series .  Will refill relpax    Taking too frequently can cause increase sx . Will evaluate at we go . I agree avoid meds that cause memory dysfunction.  Thyroid is borderline off  And should be repeat    In about a month  Consideration of intervention if  stiool abnormal .  Colon cancer  Screening  Advised . Of some sort.  ifob .  Reconsider at some time for  evaluation and treatment for hep C.      Health Maintenance, Female Adopting a healthy lifestyle and getting preventive care can go a long way to promote health and wellness. Talk with your health care provider about what schedule of regular examinations is right for you. This is a good chance for you to check in with your provider about disease prevention and staying healthy. In between checkups, there are plenty of things you can do on your own. Experts have done a lot of research about which lifestyle changes and preventive measures are most likely to keep you healthy. Ask your health care provider for more information. WEIGHT AND DIET  Eat a healthy diet  Be sure to include plenty of vegetables, fruits, low-fat dairy products, and lean protein.  Do not eat a lot of foods high in solid fats, added sugars, or salt.  Get regular exercise. This is one of the most important things you can do for your health.  Most adults should exercise for at least 150 minutes each week. The exercise should increase your heart rate and make you sweat (moderate-intensity exercise).  Most adults should also do strengthening exercises at least twice a week. This is in addition to the moderate-intensity exercise.  Maintain a healthy weight  Body mass index (BMI) is a measurement that can be used to identify possible weight problems. It estimates body fat based on height and weight. Your health care provider can help determine your BMI and help you achieve or maintain a healthy weight.  For females 72 years of age and older:    A BMI below 18.5 is considered underweight.  A BMI of 18.5 to 24.9 is normal.  A BMI of 25 to 29.9 is considered overweight.  A BMI of 30 and above is considered obese.  Watch levels of cholesterol and blood lipids  You should start having your blood tested for lipids and cholesterol at 61 years of age, then have this test every 5 years.  You may need to have your cholesterol levels checked more often if:  Your lipid or cholesterol levels are high.  You are older than 61 years of age.  You are at high risk for heart disease.  CANCER SCREENING   Lung Cancer  Lung cancer screening is recommended for adults 58-69 years old who are at high risk for lung cancer because of a history of smoking.  A yearly low-dose CT scan of the lungs is recommended for people who:  Currently smoke.  Have quit within the past 15 years.  Have at least a 30-pack-year history of smoking. A pack year is smoking an average of one pack of cigarettes a day for 1 year.  Yearly screening should continue until it has been 15 years since you quit.  Yearly screening should stop if you develop a health problem that would prevent you from having lung cancer treatment.  Breast Cancer  Practice breast self-awareness. This means understanding how your breasts normally appear and  feel.  It also means doing regular breast self-exams. Let your health care provider know about any changes, no matter how small.  If you are in your 20s or 30s, you should have a clinical breast exam (CBE) by a health care provider every 1-3 years as part of a regular health exam.  If you are 27 or older, have a CBE every year. Also consider having a breast X-ray (mammogram) every year.  If you have a family history of breast cancer, talk to your health care provider about genetic screening.  If you are at high risk for breast cancer, talk to your health care provider about having an MRI and a mammogram every year.  Breast  cancer gene (BRCA) assessment is recommended for women who have family members with BRCA-related cancers. BRCA-related cancers include:  Breast.  Ovarian.  Tubal.  Peritoneal cancers.  Results of the assessment will determine the need for genetic counseling and BRCA1 and BRCA2 testing. Cervical Cancer Your health care provider may recommend that you be screened regularly for cancer of the pelvic organs (ovaries, uterus, and vagina). This screening involves a pelvic examination, including checking for microscopic changes to the surface of your cervix (Pap test). You may be encouraged to have this screening done every 3 years, beginning at age 54.  For women ages 10-65, health care providers may recommend pelvic exams and Pap testing every 3 years, or they may recommend the Pap and pelvic exam, combined with testing for human papilloma virus (HPV), every 5 years. Some types of HPV increase your risk of cervical cancer. Testing for HPV may also be done on women of any age with unclear Pap test results.  Other health care providers may not recommend any screening for nonpregnant women who are considered low risk for pelvic cancer and who do not have symptoms. Ask your health care provider if a screening pelvic exam is right for you.  If you have had past treatment for cervical cancer or a condition that could lead to cancer, you need Pap tests and screening for cancer for at least 20 years after your treatment. If Pap tests have been discontinued, your risk factors (such as having a new sexual partner) need to be reassessed to determine if screening should resume. Some women have medical problems that increase the chance of getting cervical cancer. In these cases, your health care provider may recommend more frequent screening and Pap tests. Colorectal Cancer  This type of cancer can be detected and often prevented.  Routine colorectal cancer screening usually begins at 61 years of age and  continues through 61 years of age.  Your health care provider may recommend screening at an earlier age if you have risk factors for colon cancer.  Your health care provider may also recommend using home test kits to check for hidden blood in the stool.  A small camera at the end of a tube can be used to examine your colon directly (sigmoidoscopy or colonoscopy). This is done to check for the earliest forms of colorectal cancer.  Routine screening usually begins at age 74.  Direct examination of the colon should be repeated every 5-10 years through 61 years of age. However, you may need to be screened more often if early forms of precancerous polyps or small growths are found. Skin Cancer  Check your skin from head to toe regularly.  Tell your health care provider about any new moles or changes in moles, especially if there is a change  in a mole's shape or color.  Also tell your health care provider if you have a mole that is larger than the size of a pencil eraser.  Always use sunscreen. Apply sunscreen liberally and repeatedly throughout the day.  Protect yourself by wearing long sleeves, pants, a wide-brimmed hat, and sunglasses whenever you are outside. HEART DISEASE, DIABETES, AND HIGH BLOOD PRESSURE   High blood pressure causes heart disease and increases the risk of stroke. High blood pressure is more likely to develop in:  People who have blood pressure in the high end of the normal range (130-139/85-89 mm Hg).  People who are overweight or obese.  People who are African American.  If you are 37-32 years of age, have your blood pressure checked every 3-5 years. If you are 25 years of age or older, have your blood pressure checked every year. You should have your blood pressure measured twice--once when you are at a hospital or clinic, and once when you are not at a hospital or clinic. Record the average of the two measurements. To check your blood pressure when you are not at  a hospital or clinic, you can use:  An automated blood pressure machine at a pharmacy.  A home blood pressure monitor.  If you are between 52 years and 47 years old, ask your health care provider if you should take aspirin to prevent strokes.  Have regular diabetes screenings. This involves taking a blood sample to check your fasting blood sugar level.  If you are at a normal weight and have a low risk for diabetes, have this test once every three years after 61 years of age.  If you are overweight and have a high risk for diabetes, consider being tested at a younger age or more often. PREVENTING INFECTION  Hepatitis B  If you have a higher risk for hepatitis B, you should be screened for this virus. You are considered at high risk for hepatitis B if:  You were born in a country where hepatitis B is common. Ask your health care provider which countries are considered high risk.  Your parents were born in a high-risk country, and you have not been immunized against hepatitis B (hepatitis B vaccine).  You have HIV or AIDS.  You use needles to inject street drugs.  You live with someone who has hepatitis B.  You have had sex with someone who has hepatitis B.  You get hemodialysis treatment.  You take certain medicines for conditions, including cancer, organ transplantation, and autoimmune conditions. Hepatitis C  Blood testing is recommended for:  Everyone born from [birth year] through 1967.  Anyone with known risk factors for hepatitis C. Sexually transmitted infections (STIs)  You should be screened for sexually transmitted infections (STIs) including gonorrhea and chlamydia if:  You are sexually active and are younger than 61 years of age.  You are older than 61 years of age and your health care provider tells you that you are at risk for this type of infection.  Your sexual activity has changed since you were last screened and you are at an increased risk for chlamydia or  gonorrhea. Ask your health care provider if you are at risk.  If you do not have HIV, but are at risk, it may be recommended that you take a prescription medicine daily to prevent HIV infection. This is called pre-exposure prophylaxis (PrEP). You are considered at risk if:  You are sexually active and do not regularly use condoms  or know the HIV status of your partner(s).  You take drugs by injection.  You are sexually active with a partner who has HIV. Talk with your health care provider about whether you are at high risk of being infected with HIV. If you choose to begin PrEP, you should first be tested for HIV. You should then be tested every 3 months for as long as you are taking PrEP.  PREGNANCY   If you are premenopausal and you may become pregnant, ask your health care provider about preconception counseling.  If you may become pregnant, take 400 to 800 micrograms (mcg) of folic acid every day.  If you want to prevent pregnancy, talk to your health care provider about birth control (contraception). OSTEOPOROSIS AND MENOPAUSE   Osteoporosis is a disease in which the bones lose minerals and strength with aging. This can result in serious bone fractures. Your risk for osteoporosis can be identified using a bone density scan.  If you are 44 years of age or older, or if you are at risk for osteoporosis and fractures, ask your health care provider if you should be screened.  Ask your health care provider whether you should take a calcium or vitamin D supplement to lower your risk for osteoporosis.  Menopause may have certain physical symptoms and risks.  Hormone replacement therapy may reduce some of these symptoms and risks. Talk to your health care provider about whether hormone replacement therapy is right for you.  HOME CARE INSTRUCTIONS   Schedule regular health, dental, and eye exams.  Stay current with your immunizations.   Do not use any tobacco products including  cigarettes, chewing tobacco, or electronic cigarettes.  If you are pregnant, do not drink alcohol.  If you are breastfeeding, limit how much and how often you drink alcohol.  Limit alcohol intake to no more than 1 drink per day for nonpregnant women. One drink equals 12 ounces of beer, 5 ounces of wine, or 1 ounces of hard liquor.  Do not use street drugs.  Do not share needles.  Ask your health care provider for help if you need support or information about quitting drugs.  Tell your health care provider if you often feel depressed.  Tell your health care provider if you have ever been abused or do not feel safe at home.   This information is not intended to replace advice given to you by your health care provider. Make sure you discuss any questions you have with your health care provider.   Document Released: 07/05/2010 Document Revised: 01/10/2014 Document Reviewed: 11/21/2012 Elsevier Interactive Patient Education [employer].

## 2014-12-16 DIAGNOSIS — E038 Other specified hypothyroidism: Secondary | ICD-10-CM | POA: Insufficient documentation

## 2014-12-16 DIAGNOSIS — G4486 Cervicogenic headache: Secondary | ICD-10-CM | POA: Insufficient documentation

## 2014-12-16 DIAGNOSIS — E039 Hypothyroidism, unspecified: Secondary | ICD-10-CM | POA: Insufficient documentation

## 2014-12-16 DIAGNOSIS — R51 Headache: Secondary | ICD-10-CM

## 2015-01-06 LAB — FECAL OCCULT BLOOD, IMMUNOCHEMICAL: Fecal Occult Bld: NEGATIVE

## 2015-01-06 NOTE — Progress Notes (Signed)
Quick Note:  Inform patient stool test negative for blood . Routine yearly follow. ______ 

## 2015-01-12 ENCOUNTER — Ambulatory Visit: Payer: 59 | Admitting: Neurology

## 2015-01-16 ENCOUNTER — Other Ambulatory Visit (INDEPENDENT_AMBULATORY_CARE_PROVIDER_SITE_OTHER): Payer: 59

## 2015-01-16 ENCOUNTER — Ambulatory Visit (INDEPENDENT_AMBULATORY_CARE_PROVIDER_SITE_OTHER): Payer: 59 | Admitting: Family Medicine

## 2015-01-16 DIAGNOSIS — Z23 Encounter for immunization: Secondary | ICD-10-CM | POA: Diagnosis not present

## 2015-01-16 DIAGNOSIS — R894 Abnormal immunological findings in specimens from other organs, systems and tissues: Secondary | ICD-10-CM

## 2015-01-20 LAB — HEPATITIS C GENOTYPE

## 2015-01-26 ENCOUNTER — Ambulatory Visit (INDEPENDENT_AMBULATORY_CARE_PROVIDER_SITE_OTHER): Payer: 59 | Admitting: Neurology

## 2015-01-26 ENCOUNTER — Encounter: Payer: Self-pay | Admitting: Neurology

## 2015-01-26 VITALS — BP 120/88 | HR 100 | Ht 63.0 in | Wt 185.0 lb

## 2015-01-26 DIAGNOSIS — F411 Generalized anxiety disorder: Secondary | ICD-10-CM

## 2015-01-26 DIAGNOSIS — F329 Major depressive disorder, single episode, unspecified: Secondary | ICD-10-CM

## 2015-01-26 DIAGNOSIS — R251 Tremor, unspecified: Secondary | ICD-10-CM

## 2015-01-26 DIAGNOSIS — R413 Other amnesia: Secondary | ICD-10-CM | POA: Diagnosis not present

## 2015-01-26 DIAGNOSIS — G43709 Chronic migraine without aura, not intractable, without status migrainosus: Secondary | ICD-10-CM

## 2015-01-26 DIAGNOSIS — F32A Depression, unspecified: Secondary | ICD-10-CM

## 2015-01-26 DIAGNOSIS — IMO0002 Reserved for concepts with insufficient information to code with codable children: Secondary | ICD-10-CM | POA: Insufficient documentation

## 2015-01-26 MED ORDER — ELETRIPTAN HYDROBROMIDE 40 MG PO TABS: 40.0000 mg | ORAL_TABLET | ORAL | Status: DC | PRN

## 2015-01-26 NOTE — Patient Instructions (Addendum)
1. Continue working with psychiatry and therapy for depression 2. Take Relpax only as needed for migraines. Do not take more than 20 a month 3. Consider Botox for chronic migraine 4. Follow-up in 1 year

## 2015-01-26 NOTE — Progress Notes (Signed)
NEUROLOGY FOLLOW UP OFFICE NOTE  Toni [name] [id number]  HISTORY OF PRESENT ILLNESS: I had the pleasure of seeing Toni [name] in follow-up in the neurology clinic on 01/26/2015.  The patient was last seen 3 months ago for a diagnosis of frontotemporal dementia and migraines. Records and images were personally reviewed where available.  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 Results show that despite evidence she has mild bifrontal lobe atrophy on imaging, it was felt that she does NOT have frontotemporal dementia based on 2 evaluations that do not fit the typical pattern of frontotemporal dementia despite exhibiting consistent difficulty with complex planning/organizing. Hypotheses include that without serial imaging before 2011,"it is not possible if these changes coincided with her significant cognitive and behavioral changes or are long-standing. However, based on the probability of having "frontal symptoms" due to this finding, it is likely that the structural brain changes are related, to some extent, to her having significant change in functioning. After review of history, there are a few possibilities. She was diagnosed with hepatitis C for many decades and has not undergone treatment, is it possible that she has minimal hepatic encephalopathy? This could also be related to the patient's complaint of extreme fatigue and treatment resistant depression. Based on a very high level of personal stress in 2011, did the patient have a "nervous breakdown" and her decompensation has been related solely to psychiatric stress specifically depression? There is literature to suggest atrophy in frontal brain areas secondary to depression. The diagnosis of FTD is unlikely."  Recommendations included ongoing psychiatric evaluation/treatment, CBT, treatment for hepatitis C.  The patient was crying as I entered the room, and states that she has been undergoing significant stress as her son has relapsed and is now back in rehab. She is living by herself. She "cannot turn my brain on or off." She reports headaches on a near-daily basis and states that "everyone is freaking out about the Relpax." She had previously been prescribed 30 tablets a month. She reports there are different triggers to her migraines, including weather changes, stress, as well as neck pain ("my neck is totally degenerated"). She sees pain management with her orthopedic surgeon, but states that the Pain Management visits can trigger a migraine. She uses Imitrex injections "not even once a month," only for migraines that she wakes up with, with pain behind her eye and nausea. She reports Relpax is "the only one that helps," and that multiple medications have been tried in the past, but would cause more cognitive side effects.   HPI: This is a 60 yo LH woman with a history of migraines, treatment-resistant depression, anxiety, hepatitis C, who had initially presented with a presumed diagnosis of frontotemporal dementia. She had previously been seeing neurologist Dr. [name], then she saw Dr. [name] at [facility] one time, followed by 2 visits with Dr. Conrad [place] at [facility]. I will summarize her history for our records. She reports she was high-performance, doing multiple things at the same time, until 2011 when she started having more difficulties at work. She had been unable to do things she used to do. She started having problems sleeping. She denied any problems with her memory. She had lack of stamina, word-finding difficulties, and had been going through stress with her son at that time. She had 2 brain MRI studies done (05/08/09 and 02/24/12) which I  personally reviewed, showing mild atrophy in the bilateral frontal lobes.  She had a PET scan in 2011 reported as normal. She underwent Neuropsychological evaluation with Dr. [name] in 2011 which did not identify areas of cognitive impairment. Her current level of emotional distress seems far higher than her reported longtime baseline of what sounds like mild depression and generalized anxiety. Recommendation was for continued psychological counseling and psychiatric services.  On review of Dr. [name] notes, her MOCA score was normal 28/30. He stated that she does not appear to the the disinhibition, apathy, or language disorder on exam which would be typical of frontotemporal dementia. He did a PET scan which showed decreased metabolic activity in regions of atrophy in the anterior frontal lobes, which confounds comparison of metabolism patterns with normal age-matched individuals. No pattern of hypometabolic changes to suggest a specific type of dementia on this exam. On her last visit with him in 10/2013, his assessment was Presumed frontotemporal dementia, serial imaging demonstrated frontal atrophy bilaterally. PET was inconclusive due to pre-existing frontal atrophy. MOCA score stable at 28/30. He suggested repeating Neuropsychological testing. They discussed that SSRIs may be helpful for behavioral abnormalities. She had been taking Fetzima at that time, and was switched to Pristiq because she was reporting different side effects. She has been seeing Dr. [name] for many years and reports trying different medications. Dr. [name] had noted that she has cognitive impairment caused by frontotemporal dementia and depression. Depression has been treatment-resistant. She has extreme profound inability to sustain attention, process information, modulate information. Toni [name] reports that for the past few months, she had difficulty getting out of bed. She was tearful in the office today, stating she is "totally afraid to make commitments" and says "I have no purpose in life." She went  from no desire to do anything, to extreme anxiety of having to do something. She asks about getting home therapy to help get her out of bed and go down to eat, otherwise she would stay in bed. Her fear is that she is getting worse and her children will see that this is how she is. She just wants to "give myself the best chance of getting my life back." She feels Pristiq does not seem to be working. She tried stimulants in the past but could not take them. She reports erratic sleep, she would be sleep-deprived and get her days and nights confused, other times she would sleep for 2 straight days. She was started on a new medication one time and became so confused with hallucinations, she went to the wrong dentist office.   She has a history of migraines occurring 5 days a week, with pain behind her eyes or radiating from her neck, with uncomfortable pressure behind her eyes. Relpax helps if she takes it at the onset. If she wakes up with a migraine, she uses sumatriptan injections. She has chronic neck and back pain.   PAST MEDICAL HISTORY: Past Medical History  Diagnosis Date  . Migraine, unspecified, without mention of intractable migraine without mention of status migrainosus   . Headache(784.0)   . Brachial neuritis or radiculitis NOS   . Essential and other specified forms of tremor   . Acute hepatitis C without mention of hepatic coma     No treatment  . Other specified disorders of rotator cuff syndrome of shoulder and allied disorders   . Depressive disorder, not elsewhere classified   . Memory loss   . Microscopic hematuria     seed  dr Patsi Sears in past  . Vulvar pain     rx with valium supp    MEDICATIONS: Current Outpatient Prescriptions on File Prior to Visit  Medication Sig Dispense Refill  . ALPRAZolam (XANAX) 1 MG tablet Take 1 mg by mouth 4 (four) times daily as needed for anxiety (shakes).    . cyclobenzaprine (FLEXERIL) 10 MG tablet Take 1 tablet by mouth 2 (two) times  daily as needed. Muscle spasms/headaches    . desvenlafaxine (PRISTIQ) 50 MG 24 hr tablet Take 50 mg by mouth daily.    Marland Kitchen FLUoxetine (PROZAC) 20 MG capsule Take 1 capsule by mouth daily.  0  . HYDROcodone-ibuprofen (VICOPROFEN) 7.5-200 MG per tablet Take 0.5-1 tablets by mouth 4 (four) times daily as needed for moderate pain or severe pain.     . Misc Natural Products (ESTROVEN + ENERGY MAX STRENGTH) TABS Take by mouth daily.    . Multiple Vitamins-Minerals (CENTRUM SILVER ADULT 50+) TABS Take 1 tablet by mouth daily.    . RELPAX 40 MG tablet Take 40 mg by mouth every 2 (two) hours as needed for migraine.     . SUMAtriptan 6 MG/0.5ML SOAJ Inject 6 mg into the skin daily as needed. For migranes     No current facility-administered medications on file prior to visit.    ALLERGIES: Allergies  Allergen Reactions  . Codeine Nausea And Vomiting and Other (See Comments)    Shuts down GI tract    FAMILY HISTORY: Family History  Problem Relation Age of Onset  . Stroke Father   . Diabetes Maternal Grandmother   . Headache Mother   . Other Son     Mother is deceased 05/21/2011 father moved to the area 05/20/2012.    SOCIAL HISTORY: Social History   Social History  . Marital Status: Divorced    Spouse Name: N/A  . Number of Children: 3  . Years of Education: N/A   Occupational History  . Not on file.   Social History Main Topics  . Smoking status: Former Smoker    Types: Cigarettes    Quit date: 05/23/1978  . Smokeless tobacco: Never Used  . Alcohol Use: No  . Drug Use: No  . Sexual Activity: Not on file   Other Topics Concern  . 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 Dr. love.   She is gravida 3 para 3       REVIEW OF SYSTEMS: Constitutional: No fevers, chills, or sweats, + generalized  fatigue, change in appetite Eyes: No visual changes, double vision, eye pain Ear, nose and throat: No hearing loss, ear pain, nasal congestion, sore throat Cardiovascular: No chest pain, palpitations Respiratory:  No shortness of breath at rest or with exertion, wheezes GastrointestinaI: No nausea, vomiting, diarrhea, abdominal pain, fecal incontinence Genitourinary:  No dysuria, urinary retention or frequency Musculoskeletal:  + neck pain, back pain Integumentary: No rash, pruritus, skin lesions Neurological: as above Psychiatric: + depression, insomnia, anxiety Endocrine: No palpitations, fatigue, diaphoresis, mood swings, change in appetite, change in weight, increased thirst Hematologic/Lymphatic:  No anemia, purpura, petechiae. Allergic/Immunologic: no itchy/runny eyes, nasal congestion, recent allergic reactions, rashes  PHYSICAL EXAM: Filed Vitals:   01/26/15 1325  BP: 120/88  Pulse: 100   General: distraught, crying in the  office today Head:  Normocephalic/atraumatic Neck: supple, + paraspinal tenderness, full range of motion Heart:  Regular rate and rhythm Lungs:  Clear to auscultation bilaterally Back: No paraspinal tenderness Skin/Extremities: No rash, no edema Neurological Exam: alert and oriented to person, place, and time. No aphasia or dysarthria. Fund of knowledge is appropriate.  Recent and remote memory are intact.  Attention and concentration are normal.    Able to name objects and repeat phrases. Cranial nerves: Pupils equal, round, reactive to light.  Extraocular movements intact with no nystagmus. Visual fields full. Facial sensation intact. No facial asymmetry. Tongue, uvula, palate midline.  Motor: Bulk and tone normal, muscle strength 5/5 throughout with no pronator drift.  Sensation to light touch intact.  No extinction to double simultaneous stimulation.  Deep tendon reflexes 2+ throughout, toes downgoing.  Finger to nose testing intact.  Gait narrow-based and  steady, able to tandem walk adequately.  Romberg negative. +mild high frequency low amplitude postural tremor in both hands.  IMPRESSION: This is a 60 yo LH woman with a history of migraines, treatment-resistant depression, hepatitis C, who had initially presented for a diagnosis of presumed frontotemporal dementia. She has bilateral frontal atrophy on MRI, however PET scans have been inconclusive due to pre-existing atrophy. She underwent repeat Neuropsychological evaluation which did not indicate any evidence for frontotemporal dementia. I had an extensive discussion with her regarding these results. She does have behaviors suggestive of mild executive dysfunction, and hypotheses as to cause include symptoms related solely to psychiatric stress/depression, with literature to suggest atrophy in the frontal brain areas can be seen secondary to depression. She is distraught and crying in the office today, and was again encouraged to continue working with her therapist and psychiatrist. There was also the possibility that hepatitis C may be causing mild hepatic encephalopathy. Her LFTs have been normal, recent HCV quantitative was 1,610,960. She will discuss this with her PCP, but is not interested in seeing a liver specialist at this time. She also raised concern about migraines and use of Relpax. I had an extensive discussion with her regarding the reason for minimizing Relpax intake, she is aware of rebound headaches. I discussed headache prophylactic medications to help reduce Relpax intake, she reports medications tried in the past have caused more cognitive symptoms and tremors. I discussed the option of Botox for chronic migraine, she is hesitant at this time and is hyperfocused on obtaining Relpax. She had been receiving 30 tablets monthly in the past. I discussed with her that at this time we will do 20 tablets a month, with ultimate goal of tapering this down to 10/month. She uses Imitrex injections  sparingly (less than once a month). She will follow-up in 1 year and knows to call for any changes.   Thank you for allowing me to participate in her care.  Please do not hesitate to call for any questions or concerns.  The duration of this appointment visit was 25 minutes of face-to-face time with the patient.  Greater than 50% of this time was spent in counseling, explanation of diagnosis, planning of further management, and coordination of care.   Patrcia Dolly, M.D.   CC: Dr. Fabian Sharp, Dr. [name]

## 2015-02-05 ENCOUNTER — Encounter: Payer: Self-pay | Admitting: Neurology

## 2015-03-18 ENCOUNTER — Encounter: Payer: Self-pay | Admitting: Family Medicine

## 2015-03-18 ENCOUNTER — Ambulatory Visit (INDEPENDENT_AMBULATORY_CARE_PROVIDER_SITE_OTHER): Payer: 59 | Admitting: Family Medicine

## 2015-03-18 VITALS — BP 117/79 | HR 87 | Ht 63.0 in | Wt 182.0 lb

## 2015-03-18 DIAGNOSIS — B379 Candidiasis, unspecified: Secondary | ICD-10-CM

## 2015-03-18 MED ORDER — KETOCONAZOLE 2 % EX CREA: 1.0000 "application " | TOPICAL_CREAM | Freq: Two times a day (BID) | CUTANEOUS | Status: DC

## 2015-03-18 NOTE — Progress Notes (Signed)
   Subjective:    Patient ID: Toni [name], female    DOB: 09/08/1955, 60 y.o.   MRN: [id number]  HPI Here for a rash in a crease across the abdomen that comes and goes, but this has been worse than usual the past week. She has tried Bactroban and Neosporin without effect.    Review of Systems  Constitutional: Negative.   Skin: Positive for rash.       Objective:   Physical Exam  Constitutional: She appears well-developed and well-nourished.  Skin:  There is a transverse surgical scar across the lower abdomen with a swath of macerated red skin           Assessment & Plan:  Candidal rash. Use Ketoconazole cream prn

## 2015-03-18 NOTE — Progress Notes (Signed)
Pre visit review using our clinic review tool, if applicable. No additional management support is needed unless otherwise documented below in the visit note. 

## 2015-06-04 ENCOUNTER — Encounter: Payer: Self-pay | Admitting: Family Medicine

## 2015-06-19 NOTE — Progress Notes (Signed)
Pre visit review using our clinic review tool, if applicable. No additional management support is needed unless otherwise documented below in the visit note.  Chief Complaint  Patient presents with  . Follow-up    HPI: Toni [name] 59 y.o. comes in today for follow-up of a number of issues.  Cognition and memory. She gives [place] an update that she did have neuropsychological testing done in [place]. Was noted to have good memory and thought.  Cognitive   Tests  And was good 99  And memory.    Gained weight over the last 6 months.   recently father   Passed   2 weeks ago . She was a partial caretaker.  She states she has a sweet tooth and has a hard time pushing aside the sugars. Is drinking and 90-calorie beverage.  Stress because her son has relapsed his opiate narcotic habit. She knows she is not responsive but is still very difficult for her.  Still seeing her psychiatrist. And counseling.  Doesn't want to make any big decisions right now since her father just passed away.  Took a half of the Xanax before she came in to the visit has a mild tremor.  ROS: See pertinent positives and negatives per HPI.  Past Medical History  Diagnosis Date  . Migraine, unspecified, without mention of intractable migraine without mention of status migrainosus   . Headache(784.0)   . Brachial neuritis or radiculitis NOS   . Essential and other specified forms of tremor   . Acute hepatitis C without mention of hepatic coma     No treatment  . Other specified disorders of rotator cuff syndrome of shoulder and allied disorders   . Depressive disorder, not elsewhere classified   . Memory loss   . Microscopic hematuria     seed dr Patsi Sears in past  . Vulvar pain     rx with valium supp    Family History  Problem Relation Age of Onset  . Stroke Father     father passed away june17  . Diabetes Maternal Grandmother   . Headache Mother   . Other Son     Mother is deceased  May 16, 2011 father moved to the area 2012-05-15.    Social History   Social History  . Marital Status: Divorced    Spouse Name: N/A  . Number of Children: 3  . Years of Education: N/A   Social History Main Topics  . Smoking status: Former Smoker    Types: Cigarettes    Quit date: 05/23/1978  . Smokeless tobacco: Never Used  . Alcohol Use: No  . Drug Use: No  . Sexual Activity: Not Asked   Other Topics Concern  . 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 Dr. love.   She is gravida 3 para 3   Father passed away  Jun 24, 2022      Outpatient Prescriptions Prior to Visit  Medication Sig Dispense Refill  . ALPRAZolam (XANAX) 1 MG tablet Take 1 mg by mouth 4 (four) times daily as needed for anxiety (shakes).    . cyclobenzaprine (FLEXERIL) 10 MG tablet Take 1 tablet by mouth 2 (two) times daily as needed. Muscle spasms/headaches    . desvenlafaxine (PRISTIQ) 50 MG 24 hr tablet Take 50  mg by mouth daily.    Marland Kitchen eletriptan (RELPAX) 40 MG tablet Take 1 tablet (40 mg total) by mouth every 2 (two) hours as needed for migraine. 20 tablet 11  . FLUoxetine (PROZAC) 20 MG capsule Take 1 capsule by mouth daily.  0  . HYDROcodone-ibuprofen (VICOPROFEN) 7.5-200 MG per tablet Take 0.5-1 tablets by mouth 4 (four) times daily as needed for moderate pain or severe pain.     Marland Kitchen ketoconazole (NIZORAL) 2 % cream Apply 1 application topically 2 (two) times daily. 30 g 0  . Misc Natural Products (ESTROVEN + ENERGY MAX STRENGTH) TABS Take by mouth daily.    . Multiple Vitamins-Minerals (CENTRUM SILVER ADULT 50+) TABS Take 1 tablet by mouth daily.    . SUMAtriptan 6 MG/0.5ML SOAJ Inject 6 mg into the skin daily as needed. Reported on 03/18/2015     No facility-administered medications prior to visit.     EXAM:  BP 134/90 mmHg   Temp(Src) 97.8 F (36.6 C) (Oral)  Wt 190 lb 4.8 oz (86.32 kg)  Body mass index is 33.72 kg/(m^2).  GENERAL: vitals reviewed and listed above, alert, oriented, appears well hydrated and in no acute distress Less anxious than in the past no resting tremor. Normal speech. HEENT: atraumatic, conjunctiva  clear, no obvious abnormalities on inspection of external nose and ears  NECK: no obvious masses on inspection palpation  LUNGS: clear to auscultation bilaterally, no wheezes, rales or rhonchi, good air movement CV: HRRR, no clubbing cyanosis or  peripheral edema nl cap refill  Abdomen:  Sof,t normal bowel sounds without hepatosplenomegaly, no guarding rebound or masses no CVA tenderness MS: moves all extremities without noticeable focal  abnormality PSYCH: pleasant and cooperative, is ansious  Less than in past  Lab Results  Component Value Date   WBC 6.2 12/02/2014   HGB 14.7 12/02/2014   HCT 44.7 12/02/2014   PLT 224.0 12/02/2014   GLUCOSE 97 12/02/2014   CHOL 209* 12/02/2014   TRIG 95.0 12/02/2014   HDL 46.60 12/02/2014   LDLCALC 144* 12/02/2014   ALT 46* 06/22/2015   AST 40* 06/22/2015   NA 138 12/02/2014   K 4.0 12/02/2014   CL 102 12/02/2014   CREATININE 0.82 12/02/2014   BUN 21 12/02/2014   CO2 27 12/02/2014   TSH 2.85 06/22/2015   HGBA1C 5.4 06/22/2015    ASSESSMENT AND PLAN:  Discussed the following assessment and plan:  Subclinical hypothyroidism - Plan: TSH, T4, free, T3, free, Hemoglobin A1c, Hepatic function panel  Abnormal LFTs - Plan: TSH, T4, free, T3, free, Hemoglobin A1c, Hepatic function panel, US Abdomen Complete  Hepatitis C virus infection without hepatic coma, unspecified chronicity - Plan: TSH, T4, free, T3, free, Hemoglobin A1c, Hepatic function panel, US Abdomen Complete  Weight gain - Plan: TSH, T4, free, T3, free, Hemoglobin A1c, Hepatic function panel  Need for hepatitis A and B vaccination - Plan: Hepatitis A hepatitis B combined vaccine  IM  Memory difficulties    Risk of cancer in hep c untreated Discussed with patient today it truly is a difficult time for her but I don't want her to put off too long hepatitis C assessment as new medicines available. Marland Kitchen Updating her laboratory testing including her thyroid LFTs. Condolences about her father's death. -Patient advised to return or notify health care team  if symptoms worsen ,persist or new concerns arise.  Patient Instructions  Twin rix  Today . Thyroid test today .  Reconsider HEP C eval.  and rx  In the future .Marland Kitchen. Please   Monitor carbs  And limit cause this can  cause    Increase sweets . Cravings and weight gain.  Try to get in walking as tolerated   30 min per day     I also advise an ultrasound of the liver to make sure no nodules  .      Neta MendsWanda K. Jamere Stidham M.D.

## 2015-06-22 ENCOUNTER — Encounter: Payer: Self-pay | Admitting: Internal Medicine

## 2015-06-22 ENCOUNTER — Ambulatory Visit (INDEPENDENT_AMBULATORY_CARE_PROVIDER_SITE_OTHER): Payer: 59 | Admitting: Internal Medicine

## 2015-06-22 VITALS — BP 134/90 | Temp 97.8°F | Wt 190.3 lb

## 2015-06-22 DIAGNOSIS — R635 Abnormal weight gain: Secondary | ICD-10-CM

## 2015-06-22 DIAGNOSIS — R7989 Other specified abnormal findings of blood chemistry: Secondary | ICD-10-CM | POA: Diagnosis not present

## 2015-06-22 DIAGNOSIS — E038 Other specified hypothyroidism: Secondary | ICD-10-CM

## 2015-06-22 DIAGNOSIS — E039 Hypothyroidism, unspecified: Secondary | ICD-10-CM

## 2015-06-22 DIAGNOSIS — Z23 Encounter for immunization: Secondary | ICD-10-CM | POA: Diagnosis not present

## 2015-06-22 DIAGNOSIS — B1920 Unspecified viral hepatitis C without hepatic coma: Secondary | ICD-10-CM | POA: Diagnosis not present

## 2015-06-22 DIAGNOSIS — R413 Other amnesia: Secondary | ICD-10-CM

## 2015-06-22 DIAGNOSIS — R945 Abnormal results of liver function studies: Secondary | ICD-10-CM

## 2015-06-22 LAB — HEPATIC FUNCTION PANEL
ALBUMIN: 4.6 g/dL (ref 3.5–5.2)
ALT: 46 U/L — ABNORMAL HIGH (ref 0–35)
AST: 40 U/L — ABNORMAL HIGH (ref 0–37)
Alkaline Phosphatase: 67 U/L (ref 39–117)
Bilirubin, Direct: 0.1 mg/dL (ref 0.0–0.3)
TOTAL PROTEIN: 7.9 g/dL (ref 6.0–8.3)
Total Bilirubin: 0.5 mg/dL (ref 0.2–1.2)

## 2015-06-22 LAB — HEMOGLOBIN A1C: Hgb A1c MFr Bld: 5.4 % (ref 4.6–6.5)

## 2015-06-22 LAB — T4, FREE: FREE T4: 0.77 ng/dL (ref 0.60–1.60)

## 2015-06-22 LAB — T3, FREE: T3 FREE: 2.8 pg/mL (ref 2.3–4.2)

## 2015-06-22 LAB — TSH: TSH: 2.85 u[IU]/mL (ref 0.35–4.50)

## 2015-06-22 NOTE — Patient Instructions (Addendum)
Twin rix  Today . Thyroid test today .  Reconsider HEP C eval.   and rx  In the future .Marland Kitchen. Please   Monitor carbs  And limit cause this can  cause    Increase sweets . Cravings and weight gain.  Try to get in walking as tolerated   30 min per day     I also advise an ultrasound of the liver to make sure no nodules  .

## 2015-07-01 ENCOUNTER — Ambulatory Visit
Admission: RE | Admit: 2015-07-01 | Discharge: 2015-07-01 | Disposition: A | Discharge status: Home / Self Care | Payer: 59 | Source: Ambulatory Visit | Attending: Internal Medicine | Admitting: Internal Medicine

## 2015-07-01 DIAGNOSIS — R945 Abnormal results of liver function studies: Secondary | ICD-10-CM

## 2015-07-01 DIAGNOSIS — R7989 Other specified abnormal findings of blood chemistry: Secondary | ICD-10-CM

## 2015-07-01 DIAGNOSIS — B1920 Unspecified viral hepatitis C without hepatic coma: Secondary | ICD-10-CM

## 2015-07-10 ENCOUNTER — Other Ambulatory Visit: Payer: Self-pay | Admitting: Family Medicine

## 2015-07-10 DIAGNOSIS — R768 Other specified abnormal immunological findings in serum: Secondary | ICD-10-CM

## 2015-07-17 ENCOUNTER — Telehealth: Payer: Self-pay | Admitting: Internal Medicine

## 2015-07-17 MED ORDER — CYCLOBENZAPRINE HCL 10 MG PO TABS: 10.0000 mg | ORAL_TABLET | Freq: Two times a day (BID) | ORAL | Status: DC | PRN

## 2015-07-17 NOTE — Telephone Encounter (Signed)
Pt need new Rx for flexeril 10 mg    Pharm:  [facility]  [phone] Paul-Pharmacist on duty today.  Pt state she has not been abusing an medications and would like for you to know that she has been doing what you requested her to do.

## 2015-07-17 NOTE — Telephone Encounter (Signed)
Can rx   1 po bid if needed for headaches muscle spasms.Marland Kitchen. disp 40

## 2015-07-17 NOTE — Telephone Encounter (Signed)
Sent to the pharmacy by e-scribe. 

## 2015-08-15 ENCOUNTER — Other Ambulatory Visit: Payer: Self-pay | Admitting: Neurosurgery

## 2015-08-15 DIAGNOSIS — M47816 Spondylosis without myelopathy or radiculopathy, lumbar region: Secondary | ICD-10-CM

## 2015-08-25 ENCOUNTER — Ambulatory Visit
Admission: RE | Admit: 2015-08-25 | Discharge: 2015-08-25 | Disposition: A | Discharge status: Home / Self Care | Payer: 59 | Source: Ambulatory Visit | Attending: Neurosurgery | Admitting: Neurosurgery

## 2015-08-25 DIAGNOSIS — M47816 Spondylosis without myelopathy or radiculopathy, lumbar region: Secondary | ICD-10-CM

## 2015-09-11 ENCOUNTER — Other Ambulatory Visit: Payer: Self-pay | Admitting: Neurosurgery

## 2015-09-11 DIAGNOSIS — I7789 Other specified disorders of arteries and arterioles: Secondary | ICD-10-CM

## 2015-09-14 ENCOUNTER — Other Ambulatory Visit: Payer: Self-pay | Admitting: Family Medicine

## 2015-09-16 ENCOUNTER — Other Ambulatory Visit: Payer: 59

## 2015-09-17 ENCOUNTER — Ambulatory Visit
Admission: RE | Admit: 2015-09-17 | Discharge: 2015-09-17 | Disposition: A | Discharge status: Home / Self Care | Payer: 59 | Source: Ambulatory Visit | Attending: Neurosurgery | Admitting: Neurosurgery

## 2015-09-17 DIAGNOSIS — I7789 Other specified disorders of arteries and arterioles: Secondary | ICD-10-CM

## 2015-09-17 MED ORDER — IOPAMIDOL (ISOVUE-370) INJECTION 76%
75.0000 mL | Freq: Once | INTRAVENOUS | Status: AC | PRN
  Administered 2015-09-17: 75 mL via INTRAVENOUS

## 2015-10-05 ENCOUNTER — Telehealth: Payer: Self-pay | Admitting: Internal Medicine

## 2015-10-05 DIAGNOSIS — B182 Chronic viral hepatitis C: Secondary | ICD-10-CM

## 2015-10-05 HISTORY — DX: Chronic viral hepatitis C: B18.2

## 2015-10-05 NOTE — Telephone Encounter (Signed)
Pt would like to know when the liver referral will be done [facility] Fabian SharpPanosh that she would have this done after getting the results from the ultrasound.

## 2015-10-05 NOTE — Telephone Encounter (Signed)
Please refer for hepatitis c   Chronic to hep c clinic  And inform patient aboutreferral  thanks

## 2015-10-05 NOTE — Telephone Encounter (Signed)
Dr.Panosh, pt ready to have referral for Liver doctor. Please advise.

## 2015-10-06 ENCOUNTER — Other Ambulatory Visit: Payer: Self-pay | Admitting: Family Medicine

## 2015-10-06 NOTE — Telephone Encounter (Signed)
Pt notified that referral has been placed

## 2015-10-07 MED ORDER — CYCLOBENZAPRINE HCL 10 MG PO TABS: 10.0000 mg | ORAL_TABLET | Freq: Two times a day (BID) | ORAL | 0 refills | Status: DC | PRN

## 2015-10-07 NOTE — Telephone Encounter (Signed)
Ok x 1

## 2015-10-30 ENCOUNTER — Telehealth: Payer: Self-pay | Admitting: Internal Medicine

## 2015-10-30 NOTE — Telephone Encounter (Signed)
Pt has questions concerning genotype

## 2015-11-02 NOTE — Telephone Encounter (Signed)
Spoke to the pt.  She collected the information that she needed off of MyChart.  Pt has an appt with liver specialist on 11/09/15 and looking forward to being seen.

## 2015-11-10 ENCOUNTER — Other Ambulatory Visit (HOSPITAL_COMMUNITY): Payer: Self-pay | Admitting: Nurse Practitioner

## 2015-11-10 DIAGNOSIS — B182 Chronic viral hepatitis C: Secondary | ICD-10-CM

## 2015-11-24 ENCOUNTER — Ambulatory Visit (HOSPITAL_COMMUNITY): Payer: 59

## 2015-12-02 ENCOUNTER — Ambulatory Visit (HOSPITAL_COMMUNITY): Admission: RE | Admit: 2015-12-02 | Payer: 59 | Source: Ambulatory Visit

## 2015-12-08 ENCOUNTER — Other Ambulatory Visit: Payer: 59

## 2015-12-15 ENCOUNTER — Encounter: Payer: 59 | Admitting: Internal Medicine

## 2015-12-15 ENCOUNTER — Ambulatory Visit (HOSPITAL_COMMUNITY): Payer: 59

## 2015-12-17 ENCOUNTER — Other Ambulatory Visit: Payer: 59

## 2015-12-17 ENCOUNTER — Other Ambulatory Visit (INDEPENDENT_AMBULATORY_CARE_PROVIDER_SITE_OTHER): Payer: 59

## 2015-12-17 DIAGNOSIS — Z0000 Encounter for general adult medical examination without abnormal findings: Secondary | ICD-10-CM | POA: Diagnosis not present

## 2015-12-17 LAB — BASIC METABOLIC PANEL
BUN: 22 mg/dL (ref 6–23)
CHLORIDE: 103 meq/L (ref 96–112)
CO2: 30 meq/L (ref 19–32)
CREATININE: 0.85 mg/dL (ref 0.40–1.20)
Calcium: 9.5 mg/dL (ref 8.4–10.5)
GFR: 71.98 mL/min (ref >60.00)
GLUCOSE: 99 mg/dL (ref 70–99)
Potassium: 3.7 mEq/L (ref 3.5–5.1)
Sodium: 140 mEq/L (ref 135–145)

## 2015-12-17 LAB — HEPATIC FUNCTION PANEL
ALT: 36 U/L — ABNORMAL HIGH (ref 0–35)
AST: 41 U/L — ABNORMAL HIGH (ref 0–37)
Albumin: 4.4 g/dL (ref 3.5–5.2)
Alkaline Phosphatase: 72 U/L (ref 39–117)
BILIRUBIN DIRECT: 0.1 mg/dL (ref 0.0–0.3)
BILIRUBIN TOTAL: 0.5 mg/dL (ref 0.2–1.2)
TOTAL PROTEIN: 7.4 g/dL (ref 6.0–8.3)

## 2015-12-17 LAB — LIPID PANEL
CHOL/HDL RATIO: 4
Cholesterol: 206 mg/dL — ABNORMAL HIGH (ref 0–200)
HDL: 47.1 mg/dL (ref >39.00)
LDL CALC: 140 mg/dL — AB (ref 0–99)
NonHDL: 159.04
TRIGLYCERIDES: 97 mg/dL (ref 0.0–149.0)
VLDL: 19.4 mg/dL (ref 0.0–40.0)

## 2015-12-17 LAB — TSH: TSH: 3.88 u[IU]/mL (ref 0.35–4.50)

## 2015-12-18 LAB — CBC WITH DIFFERENTIAL/PLATELET
BASOS PCT: 0.7 % (ref 0.0–3.0)
Basophils Absolute: 0 10*3/uL (ref 0.0–0.1)
EOS ABS: 0 10*3/uL (ref 0.0–0.7)
EOS PCT: 0.7 % (ref 0.0–5.0)
HCT: 42.1 % (ref 36.0–46.0)
Hemoglobin: 14.1 g/dL (ref 12.0–15.0)
Lymphocytes Relative: 33.3 % (ref 12.0–46.0)
Lymphs Abs: 1.9 10*3/uL (ref 0.7–4.0)
MCHC: 33.5 g/dL (ref 30.0–36.0)
MCV: 87.5 fl (ref 78.0–100.0)
MONO ABS: 0.5 10*3/uL (ref 0.1–1.0)
Monocytes Relative: 8.7 % (ref 3.0–12.0)
NEUTROS ABS: 3.1 10*3/uL (ref 1.4–7.7)
Neutrophils Relative %: 56.6 % (ref 43.0–77.0)
PLATELETS: 220 10*3/uL (ref 150.0–400.0)
RBC: 4.82 Mil/uL (ref 3.87–5.11)
RDW: 13.5 % (ref 11.5–15.5)
WBC: 5.6 10*3/uL (ref 4.0–10.5)

## 2015-12-23 ENCOUNTER — Ambulatory Visit (HOSPITAL_COMMUNITY)
Admission: RE | Admit: 2015-12-23 | Discharge: 2015-12-23 | Disposition: A | Discharge status: Home / Self Care | Payer: 59 | Source: Ambulatory Visit | Attending: Nurse Practitioner | Admitting: Nurse Practitioner

## 2015-12-23 DIAGNOSIS — B182 Chronic viral hepatitis C: Secondary | ICD-10-CM | POA: Diagnosis present

## 2015-12-30 ENCOUNTER — Encounter: Payer: Self-pay | Admitting: Internal Medicine

## 2015-12-30 ENCOUNTER — Other Ambulatory Visit (HOSPITAL_COMMUNITY)
Admission: RE | Admit: 2015-12-30 | Discharge: 2015-12-30 | Disposition: A | Discharge status: Home / Self Care | Payer: 59 | Source: Ambulatory Visit | Attending: Internal Medicine | Admitting: Internal Medicine

## 2015-12-30 ENCOUNTER — Other Ambulatory Visit: Payer: Self-pay | Admitting: Internal Medicine

## 2015-12-30 ENCOUNTER — Ambulatory Visit (INDEPENDENT_AMBULATORY_CARE_PROVIDER_SITE_OTHER): Payer: 59 | Admitting: Internal Medicine

## 2015-12-30 ENCOUNTER — Other Ambulatory Visit: Payer: Self-pay | Admitting: Nurse Practitioner

## 2015-12-30 VITALS — BP 132/92 | Temp 98.0°F | Ht 62.0 in | Wt 197.0 lb

## 2015-12-30 DIAGNOSIS — Z0000 Encounter for general adult medical examination without abnormal findings: Secondary | ICD-10-CM

## 2015-12-30 DIAGNOSIS — E785 Hyperlipidemia, unspecified: Secondary | ICD-10-CM

## 2015-12-30 DIAGNOSIS — B1920 Unspecified viral hepatitis C without hepatic coma: Secondary | ICD-10-CM

## 2015-12-30 DIAGNOSIS — Z79899 Other long term (current) drug therapy: Secondary | ICD-10-CM

## 2015-12-30 DIAGNOSIS — Z01419 Encounter for gynecological examination (general) (routine) without abnormal findings: Secondary | ICD-10-CM | POA: Diagnosis present

## 2015-12-30 DIAGNOSIS — Z1231 Encounter for screening mammogram for malignant neoplasm of breast: Secondary | ICD-10-CM

## 2015-12-30 DIAGNOSIS — Z1151 Encounter for screening for human papillomavirus (HPV): Secondary | ICD-10-CM | POA: Diagnosis not present

## 2015-12-30 NOTE — Patient Instructions (Signed)
Will notify you when pap results are available. Call dr Madilyn FiremanHAYES office  For fu colonoscopy. Get mammogram when possible [facility].    Pay attention to life style eating and  Activity as possible.  Healthy lifestyle includes : At least 150 minutes of exercise weeks  , weight at healthy levels, which is usually   BMI 19-25. Avoid trans fats and processed foods;  Increase fresh fruits and veges to 5 servings per day. And avoid sweet beverages including tea and juice. Mediterranean diet with olive oil and nuts have been noted to be heart and brain healthy . Avoid tobacco products . Limit  alcohol to  7 per week for women and 14 servings for men.  Get adequate sleep . Wear seat belts . Don't text and drive .

## 2015-12-30 NOTE — Progress Notes (Signed)
Pre visit review using our clinic review tool, if applicable. No additional management support is needed unless otherwise documented below in the visit note.  Chief Complaint  Patient presents with  . Annual Exam    HPI: Patient  Toni [name]  60 y.o. comes in today for Preventive Health Care visit  Since her last visit she's continued to see psychiatry and is now been placed on Dexedrine Spansules to try for the fatigue and attempts to get out of bed. She's also in intensive counseling. She's begun in the hepatitis C clinic in the good news is she has very little fibrosis on her liver and will begin treatment for hepatitis C infection. She's overdue for mammogram She is due for a colonoscopy last one was Dr. Madilyn FiremanHayes. No known family history of colon cancer. Her mom had a mass that was undefined could've been a GI cancer. Blood pressure usually is good. Occasional use of Flexeril if needed. Rest of medications are given by her specialists. Menopause about age 60. Occasional hot flashes used over-the-counter's in the past. Last Pap smear about 3 years ago Dr. Yehuda BuddSpears never had an abnormal no problem.  Health Maintenance  Topic Date Due  . TETANUS/TDAP  10/03/1972  . COLONOSCOPY  01/03/2014  . PAP SMEAR  10/04/2015  . MAMMOGRAM  10/16/2015  . HIV Screening  12/28/2016 (Originally 10/03/1970)  . INFLUENZA VACCINE  Completed  . ZOSTAVAX  Addressed  . Hepatitis C Screening  Completed   Health Maintenance Review LIFESTYLE:  Exercise:  Not much in bed a lot Tobacco/ETS:n Alcohol: avoiding now Sugar beverages:n Sleep: 8 hours but stays in bed  Drug use: no HH of 3-4 soon to be one ( youngest son in active addition   Child and sons mom were at house for 5 months to be leaving   Stress ful  Father passed this past year. Good relatinship with older son et al ROS: see above GEN/ HEENT: No fever, significant weight changes sweats headaches vision problems hearing  changes, CV/ PULM; No chest pain shortness of breath cough, syncope,edema  change in exercise tolerance. GI /GU: No adominal pain, vomiting, change in bowel habits. No blood in the stool. No significant GU symptoms. SKIN/HEME: ,no acute skin rashes suspicious lesions or bleeding. No lymphadenopathy, nodules, masses.  NEURO/ PSYCH:  No neurologic signs such as weakness numbness. No depression anxiety. IMM/ Allergy: No unusual infections.  Allergy .   REST of 12 system review negative except as per HPI   Past Medical History:  Diagnosis Date  . Acute hepatitis C without mention of hepatic coma(070.51)    No treatment  . Brachial neuritis or radiculitis NOS   . Depressive disorder, not elsewhere classified   . Essential and other specified forms of tremor   . Headache(784.0)   . Memory loss   . Microscopic hematuria    [name] in past  . Migraine, unspecified, without mention of intractable migraine without mention of status migrainosus   . Other specified disorders of rotator cuff syndrome of shoulder and allied disorders   . Vulvar pain    rx with valium supp    Past Surgical History:  Procedure Laterality Date  . CESAREAN SECTION     x3  . SHOULDER ARTHROSCOPY W/ ROTATOR CUFF REPAIR Right 2004  . SHOULDER ARTHROSCOPY W/ ROTATOR CUFF REPAIR Left 2008    Family History  Problem Relation Age of Onset  . Stroke Father     father passed away  june17  . Diabetes Maternal Grandmother   . Headache Mother   . Other Son     Mother is deceased 05-27-2011 father moved to the area 05/26/12.    Social History   Social History  . Marital status: Divorced    Spouse name: N/A  . Number of children: 3  . Years of education: N/A   Social History Main Topics  . Smoking status: Former Smoker    Types: Cigarettes    Quit date: 05/23/1978  . Smokeless tobacco: Never Used  . Alcohol use No  . Drug use: No  . Sexual activity: Not Asked   Other Topics Concern  . 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 On disability for depression and short-term memory problems. Dr. love.   She is gravida 3 para 3   Father passed away  Jul 05, 2022      Outpatient Medications Prior to Visit  Medication Sig Dispense Refill  . ALPRAZolam (XANAX) 1 MG tablet Take 1 mg by mouth 4 (four) times daily as needed for anxiety (shakes).    . cetirizine (ZYRTEC) 10 MG tablet Take 10 mg by mouth daily as needed for allergies.    . cyclobenzaprine (FLEXERIL) 10 MG tablet Take 1 tablet (10 mg total) by mouth 2 (two) times daily as needed. Muscle spasms/headaches 40 tablet 0  . desvenlafaxine (PRISTIQ) 50 MG 24 hr tablet Take 50 mg by mouth daily.    Marland Kitchen eletriptan (RELPAX) 40 MG tablet Take 1 tablet (40 mg total) by mouth every 2 (two) hours as needed for migraine. 20 tablet 11  . FLUoxetine (PROZAC) 20 MG capsule Take 1 capsule by mouth daily.  0  . HYDROcodone-ibuprofen (VICOPROFEN) 7.5-200 MG per tablet Take 0.5-1 tablets by mouth 4 (four) times daily as needed for moderate pain or severe pain.     Marland Kitchen ketoconazole (NIZORAL) 2 % cream APPLY 1 APPLICATION TOPICALLY 2 (TWO) TIMES DAILY. 30 g 0  . Misc Natural Products (ESTROVEN + ENERGY MAX STRENGTH) TABS Take by mouth daily.    . Multiple Vitamins-Minerals (CENTRUM SILVER ADULT 50+) TABS Take 1 tablet by mouth daily.    . SUMAtriptan 6 MG/0.5ML SOAJ Inject 6 mg into the skin daily as needed. Reported on 03/18/2015     No facility-administered medications prior to visit.      EXAM:  BP (!) 132/92 (BP Location: Right Arm, Patient Position: Sitting, Cuff Size: Large)   Temp 98 F (36.7 C) (Oral)   Ht 5\' 2"  (1.575 m)   Wt 197 lb (89.4 kg)   BMI 36.03 kg/m   Body mass index is 36.03 kg/m.  Physical Exam: Vital signs reviewed ZOX:WRUE is a well-developed well-nourished alert cooperative    who appearsr stated age in no  acute distress.  HEENT: normocephalic atraumatic , Eyes: PERRL EOM's full, conjunctiva clear, Nares: paten,t no deformity discharge or tenderness., Ears: no deformity EAC's clear TMs with normal landmarks. Mouth: clear OP, no lesions, edema.  Moist mucous membranes. Dentition in adequate repair. NECK: supple without masses, thyromegaly or bruits. CHEST/PULM:  Clear to auscultation and percussion breath sounds equal no wheeze , rales or rhonchi. No chest wall deformities or tenderness.Breast: normal by inspection . No dimpling, discharge, masses, tenderness or discharge . CV: PMI is nondisplaced, S1 S2 no gallops, murmurs, rubs. Peripheral  pulses are full without delay.No JVD .  ABDOMEN: Bowel sounds normal nontender  No guard or rebound, no hepato splenomegal no CVA tenderness.  No hernia. Extremtities:  No clubbing cyanosis or edema, no acute joint swelling or redness no focal atrophy NEURO:  Oriented x3, cranial nerves 3-12 appear to be intact, no obvious focal weakness,gait within normal limits mild temor  No obv weaknessSKIN: No acute rashes normal turgor, color, no bruising or petechiae. PSYCH: Oriented, good eye contact, anxiety  Better  Emotional when disc  family, cognition and judgment appear normal.for conversation LN: no cervical axillary inguinal adenopathy Pelvic: NL ext GU, labia clear without lesions or rash . Vagina no lesions .Cervix: posterior pap done hpv UTERUS: Neg CMT Adnexa:  clear no obvious   masses .some tenderness  PAP done w  HRhpv cotesting  rectal no mass   Heme neg    Lab Results  Component Value Date   WBC 5.6 12/17/2015   HGB 14.1 12/17/2015   HCT 42.1 12/17/2015   PLT 220.0 12/17/2015   GLUCOSE 99 12/17/2015   CHOL 206 (H) 12/17/2015   TRIG 97.0 12/17/2015   HDL 47.10 12/17/2015   LDLCALC 140 (H) 12/17/2015   ALT 36 (H) 12/17/2015   AST 41 (H) 12/17/2015   NA 140 12/17/2015   K 3.7 12/17/2015   CL 103 12/17/2015   CREATININE 0.85 12/17/2015   BUN 22  12/17/2015   CO2 30 12/17/2015   TSH 3.88 12/17/2015   HGBA1C 5.4 06/22/2015  blood work reviewed  BP Readings from Last 3 Encounters:  12/30/15 (!) 132/92  06/22/15 134/90  03/18/15 117/79   Wt Readings from Last 3 Encounters:  12/30/15 197 lb (89.4 kg)  06/22/15 190 lb 4.8 oz (86.3 kg)  03/18/15 182 lb (82.6 kg)    ASSESSMENT AND PLAN:  Discussed the following assessment and plan:  Visit for preventive health examination - Plan: PAP [[place]]  Encounter for gynecological examination without abnormal finding - Plan: PAP [[place]]  Medication management  Hepatitis C virus infection without hepatic coma, unspecified chronicity - under planned rx favorable for rx  Hyperlipidemia, unspecified hyperlipidemia type - lsi  Pap coloncancer screening  Attend to eating and healthy ls  Weight loss  Etc  Caution med use se  Flexeril as needed . Monitor bp for control  Patient Care Team: Madelin HeadingsWanda K Loyalty Arentz, MD as PCP - General (Internal Medicine) Shirlean Kellyobert Nudelman, MD as Consulting Physician (Neurosurgery) Jethro BolusSigmund Tannenbaum, MD as Consulting Physician (Urology) Milagros Evenerupinder Kaur, MD as Consulting Physician (Psychiatry) Van ClinesKaren M Aquino, MD as Consulting Physician (Neurology) Patient Instructions  Will notify you when pap results are available. Call dr Madilyn FiremanHAYES office  For fu colonoscopy. Get mammogram when possible [facility].    Pay attention to life style eating and  Activity as possible.  Healthy lifestyle includes : At least 150 minutes of exercise weeks  , weight at healthy levels, which is usually   BMI 19-25. Avoid trans fats and processed foods;  Increase fresh fruits and veges to 5 servings per day. And avoid sweet beverages including tea and juice. Mediterranean diet with olive oil and nuts have been noted to be heart and brain healthy . Avoid tobacco products . Limit  alcohol to  7 per week for women and 14 servings for men.  Get adequate sleep . Wear seat belts  . Don't text and drive .       Neta MendsWanda K. Afton Mikelson M.D.

## 2016-01-01 LAB — CYTOLOGY - PAP
Diagnosis: NEGATIVE
HPV: NOT DETECTED

## 2016-01-01 NOTE — Progress Notes (Signed)
Tell patient PAP is normal. HPV high  risk is negative

## 2016-01-05 ENCOUNTER — Encounter: Payer: Self-pay | Admitting: Family Medicine

## 2016-01-26 ENCOUNTER — Ambulatory Visit: Payer: 59 | Admitting: Neurology

## 2016-01-26 ENCOUNTER — Encounter: Payer: Self-pay | Admitting: Internal Medicine

## 2016-01-26 ENCOUNTER — Ambulatory Visit (INDEPENDENT_AMBULATORY_CARE_PROVIDER_SITE_OTHER)
Admission: RE | Admit: 2016-01-26 | Discharge: 2016-01-26 | Disposition: A | Discharge status: Home / Self Care | Payer: 59 | Source: Ambulatory Visit | Attending: Internal Medicine | Admitting: Internal Medicine

## 2016-01-26 ENCOUNTER — Ambulatory Visit (INDEPENDENT_AMBULATORY_CARE_PROVIDER_SITE_OTHER): Payer: 59 | Admitting: Internal Medicine

## 2016-01-26 VITALS — BP 154/110 | HR 111 | Temp 98.1°F | Wt 195.0 lb

## 2016-01-26 DIAGNOSIS — R059 Cough, unspecified: Secondary | ICD-10-CM

## 2016-01-26 DIAGNOSIS — R05 Cough: Secondary | ICD-10-CM

## 2016-01-26 DIAGNOSIS — R0602 Shortness of breath: Secondary | ICD-10-CM | POA: Diagnosis not present

## 2016-01-26 DIAGNOSIS — J22 Unspecified acute lower respiratory infection: Secondary | ICD-10-CM | POA: Diagnosis not present

## 2016-01-26 MED ORDER — BENZONATATE 100 MG PO CAPS: 100.0000 mg | ORAL_CAPSULE | Freq: Two times a day (BID) | ORAL | 1 refills | Status: DC | PRN

## 2016-01-26 MED ORDER — DOXYCYCLINE HYCLATE 100 MG PO TABS: 100.0000 mg | ORAL_TABLET | Freq: Two times a day (BID) | ORAL | 0 refills | Status: DC

## 2016-01-26 NOTE — Progress Notes (Signed)
Pre visit review using our clinic review tool, if applicable. No additional management support is needed unless otherwise documented below in the visit note.  Chief Complaint  Patient presents with  . Cough    X8days.  Pt requesting Avelox if an antibiotic is needed.  Cough is productive of a brown sputum.  Worse in the mornings.  . Nasal Congestion  . Shortness of Breath  . Chills    HPI: Toni [name] 61 y.o.    sda   Exposed to a house may no longer daycare 2 have lung disease and is known ICU with pneumonia and had strokes. This is the baby mama. She is still taking care of a grandchild but family status is been stabilized. Has been on heart phone recently and is plan to do her preventive screenings. She developed cough some congestion that is getting worse after 8 days some shortness of breath phlegm that is nasty thick and brown. There might of been one speck of blood. No fever per se but cough is difficult. She is to have a [place]baby born anytime now. Concern about risks and exposures.  ROS: See pertinent positives and negatives per HPI.  Past Medical History:  Diagnosis Date  . Acute hepatitis C without mention of hepatic coma(070.51)    No treatment  . Brachial neuritis or radiculitis NOS   . Depressive disorder, not elsewhere classified   . Essential and other specified forms of tremor   . Headache(784.0)   . Memory loss   . Microscopic hematuria    [name] in past  . Migraine, unspecified, without mention of intractable migraine without mention of status migrainosus   . Other specified disorders of rotator cuff syndrome of shoulder and allied disorders   . Vulvar pain    rx with valium supp    Family History  Problem Relation Age of Onset  . Stroke Father     father passed away june17  . Diabetes Maternal Grandmother   . Headache Mother   . Other Son     Mother is deceased 2013 father moved to the area 2014.    Social History    Social History  . Marital status: Divorced    Spouse name: N/A  . Number of children: 3  . Years of education: N/A   Social History Main Topics  . Smoking status: Former Smoker    Types: Cigarettes    Quit date: 05/23/1978  . Smokeless tobacco: Never Used  . Alcohol use No  . Drug use: No  . Sexual activity: Not Asked   Other Topics Concern  . None   Social History Narrative   Does not sleep well at night   Lives alone   Has two cats   On Environmental managerDisability   College graduate previous worked for [employer]. On disability for depression and short-term memory problems. Dr. love.   She is gravida 3 para 3   Father passed away  June 17       Outpatient Medications Prior to Visit  Medication Sig Dispense Refill  . ALPRAZolam (XANAX) 1 MG tablet Take 1 mg by mouth 4 (four) times daily as needed for anxiety (shakes).    . cetirizine (ZYRTEC) 10 MG tablet Take 10 mg by mouth daily as needed for allergies.    . cyclobenzaprine (FLEXERIL) 10 MG tablet Take 1 tablet (10 mg total) by mouth 2 (two) times daily as needed. Muscle spasms/headaches 40 tablet 0  . desvenlafaxine (PRISTIQ)  50 MG 24 hr tablet Take 50 mg by mouth daily.    Marland Kitchen dextroamphetamine (DEXEDRINE SPANSULE) 15 MG 24 hr capsule Take 15 mg by mouth daily.    Marland Kitchen eletriptan (RELPAX) 40 MG tablet Take 1 tablet (40 mg total) by mouth every 2 (two) hours as needed for migraine. 20 tablet 11  . FLUoxetine (PROZAC) 20 MG capsule Take 1 capsule by mouth daily.  0  . gabapentin (NEURONTIN) 100 MG capsule Take 300 mg by mouth 3 (three) times daily as needed.     Marland Kitchen HYDROcodone-ibuprofen (VICOPROFEN) 7.5-200 MG per tablet Take 0.5-1 tablets by mouth 4 (four) times daily as needed for moderate pain or severe pain.     Marland Kitchen ketoconazole (NIZORAL) 2 % cream APPLY 1 APPLICATION TOPICALLY 2 (TWO) TIMES DAILY. 30 g 0  . Misc Natural Products (ESTROVEN + ENERGY MAX STRENGTH) TABS Take by mouth daily.    . Multiple Vitamins-Minerals (CENTRUM SILVER  ADULT 50+) TABS Take 1 tablet by mouth daily.    . SUMAtriptan 6 MG/0.5ML SOAJ Inject 6 mg into the skin daily as needed. Reported on 03/18/2015     No facility-administered medications prior to visit.      EXAM:  BP (!) 154/110 (BP Location: Right Arm, Patient Position: Sitting, Cuff Size: Normal)   Pulse (!) 111   Temp 98.1 F (36.7 C) (Oral)   Wt 195 lb (88.5 kg)   SpO2 98%   BMI 35.67 kg/m   Body mass index is 35.67 kg/m. WDWN in NAD  quiet respirations; mildly congested  somewhat hoarse. Non toxic .Intermittent coughing no obvious dyspnea at rest HEENT: Normocephalic ;atraumatic , Eyes;  PERRL, EOMs  Full, lids and conjunctiva clear,,Ears: no deformities, canals nl, wax on the right great TM left TM intact TM landmarks normal, Nose: no deformity or discharge but congested;face non tender Mouth : OP clear without lesion or edema .  Red 1+  Neck: Supple without adenopathy or masses or bruits tender ac node  Chest:  Clear to A without wheezes rales or rhonchi but difficult cause of cough induced by deep breathing  CV:  S1-S2 no gallops or murmurs peripheral perfusion is normal Skin :nl perfusion and no acute rashes   Wt Readings from Last 3 Encounters:  01/26/16 195 lb (88.5 kg)  12/30/15 197 lb (89.4 kg)  06/22/15 190 lb 4.8 oz (86.3 kg)    ASSESSMENT AND PLAN:  Discussed the following assessment and plan:  Cough - Plan: DG Chest 2 View  Shortness of breath - Plan: DG Chest 2 View  Acute respiratory infection - Plan: DG Chest 2 View She will find out the respiratory infection exposure. Her exam is reassuring but did chest x-ray are suboptimal ability to tell on exam. We are seeing epidemic flu also. High risk situation. Would not use Avelox less documented outpatient pneumonia because of the risk more than benefit of that medication. Symptomatic treatment also expectant management. Glad that she is following through with her health maintenance. -Patient advised to  return or notify health care team  if symptoms worsen ,persist or new concerns arise.  Patient Instructions  Your oxygen level is good Go to the [facility] of our building to the basement and get a chest x-ray and go on your way will contact you about the results. Ascending medication Tessalon Perles for comfort cough. We may add an antibiotic based on your symptoms and x-ray. Do not usually use quinolone such as Avelox and Levaquin as first line  because of potential side effects but will decide based on results.  I still think the initial respiratory illness could've been flu based on exposures. He will contact us if you do get a fever or either way you're not improving in the next 3-4 days.Neta Mends. Maeva Dant M.D.

## 2016-01-26 NOTE — Patient Instructions (Addendum)
Your oxygen level is good Go to the [facility] of our building to the basement and get a chest x-ray and go on your way will contact you about the results. Ascending medication Tessalon Perles for comfort cough. We may add an antibiotic based on your symptoms and x-ray. Do not usually use quinolone such as Avelox and Levaquin as first line because of potential side effects but will decide based on results.  I still think the initial respiratory illness could've been flu based on exposures. He will contact us if you do get a fever or either way you're not improving in the next 3-4 days..Marland Kitchen

## 2016-01-28 ENCOUNTER — Encounter: Payer: Self-pay | Admitting: Neurology

## 2016-01-28 ENCOUNTER — Ambulatory Visit (INDEPENDENT_AMBULATORY_CARE_PROVIDER_SITE_OTHER): Payer: 59 | Admitting: Neurology

## 2016-01-28 VITALS — BP 148/88 | HR 108 | Ht 62.0 in | Wt 196.6 lb

## 2016-01-28 DIAGNOSIS — F411 Generalized anxiety disorder: Secondary | ICD-10-CM | POA: Diagnosis not present

## 2016-01-28 DIAGNOSIS — F321 Major depressive disorder, single episode, moderate: Secondary | ICD-10-CM | POA: Diagnosis not present

## 2016-01-28 DIAGNOSIS — G43709 Chronic migraine without aura, not intractable, without status migrainosus: Secondary | ICD-10-CM | POA: Diagnosis not present

## 2016-01-28 DIAGNOSIS — IMO0002 Reserved for concepts with insufficient information to code with codable children: Secondary | ICD-10-CM

## 2016-01-28 MED ORDER — ELETRIPTAN HYDROBROMIDE 40 MG PO TABS: 40.0000 mg | ORAL_TABLET | ORAL | 11 refills | Status: DC | PRN

## 2016-01-28 NOTE — Progress Notes (Signed)
NEUROLOGY FOLLOW UP OFFICE NOTE  Toni [name] [id number]  HISTORY OF PRESENT ILLNESS: I had the pleasure of seeing Toni [name] in follow-up in the neurology clinic on 01/28/2016.  The patient was last seen a year ago for a diagnosis of frontotemporal dementia and migraines. Repeat Neuropsychological evaluation in 2016 indicated that despite evidence she has mild bifrontal lobe atrophy on imaging, it was felt that she does NOT have frontotemporal dementia based on 2 evaluations that do not fit the typical pattern of frontotemporal dementia despite exhibiting consistent difficulty with complex planning/organizing. Hypotheses include that without serial imaging before 2011,"it is not possible if these changes coincided with her significant cognitive and behavioral changes or are long-standing. However, based on the probability of having "frontal symptoms" due to this finding, it is likely that the structural brain changes are related, to some extent, to her having significant change in functioning. 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  Since her last visit, she is doing much better and reports she is "in a better place." She was very depressed on her last visit, but today reports that she is still depressed but is happy that she is now on treatment for the hepatitis C, and that since she started the dextroamphetamine a month and a half ago, she is getting out more. She is seeing Dr. Newell [name] for her neck pain, which is "in really bad shape," she takes Vicoprofen for this. Gabapentin was recently added, she does not take it TID, takes more depending on her pain level. She continues to have frequent headaches, and has a prescription for 20 Relpax a month. We had reduced this from her previous intake of 30 tablets a month.  She asks that we continue on current dose for now as she continues to deal with several family issues that stress her out and cause more headaches. She has read about Botox but does not want to proceed with this for now. She uses Imitrex injections rarely for migraines that she wakes up with, with pain behind her eye and nausea. She reports Relpax is "the only one that helps," and that multiple medications have been tried in the past, but would cause more cognitive side effects.   HPI: This is a 61 yo LH woman with a history of migraines, treatment-resistant depression, anxiety, hepatitis C, who had initially presented with a presumed diagnosis of frontotemporal dementia. She had previously been seeing neurologist Dr. [name], then she saw Dr. [name] at [facility] one time, followed by 2 visits with Dr. Conrad [place] at [facility]. I will summarize her history for our records. She reports she was high-performance, doing multiple things at the same time, until 2011 when she started having more difficulties at work. She had been unable to do things she used to do. She started having problems sleeping. She denied any problems with her memory. She had lack of stamina, word-finding difficulties, and had been going through stress with her son at that time. She had 2 brain MRI studies done (05/08/09 and 02/24/12) which I personally reviewed, showing mild atrophy in the bilateral frontal lobes. She had a PET scan in 2011 reported as normal. She underwent Neuropsychological evaluation with Dr. [name] in 2011 which did not identify areas of cognitive impairment. Her current level of emotional distress seems far higher than her reported longtime baseline of what sounds like mild depression and  generalized anxiety. Recommendation was for continued psychological counseling and psychiatric services.  On review of Dr. Cornelious BryantLefkowitz's notes, her MOCA score was normal 28/30. He stated that she does not appear to the the disinhibition, apathy, or  language disorder on exam which would be typical of frontotemporal dementia. He did a PET scan which showed decreased metabolic activity in regions of atrophy in the anterior frontal lobes, which confounds comparison of metabolism patterns with normal age-matched individuals. No pattern of hypometabolic changes to suggest a specific type of dementia on this exam. On her last visit with him in 10/2013, his assessment was Presumed frontotemporal dementia, serial imaging demonstrated frontal atrophy bilaterally. PET was inconclusive due to pre-existing frontal atrophy. MOCA score stable at 28/30. He suggested repeating Neuropsychological testing. They discussed that SSRIs may be helpful for behavioral abnormalities. She had been taking Fetzima at that time, and was switched to Pristiq because she was reporting different side effects. She has been seeing Dr. Evelene CroonKaur for many years and reports trying different medications. Dr. Evelene CroonKaur had noted that she has cognitive impairment caused by frontotemporal dementia and depression. Depression has been treatment-resistant. She has extreme profound inability to sustain attention, process information, modulate information. [name] that for the past few months, she had difficulty getting out of bed. She was tearful in the office today, stating she is "totally afraid to make commitments" and says "I have no purpose in life." She went from no desire to do anything, to extreme anxiety of having to do something. She asks about getting home therapy to help get her out of bed and go down to eat, otherwise she would stay in bed. Her fear is that she is getting worse and her children will see that this is how she is. She just wants to "give myself the best chance of getting my life back." She feels Pristiq does not seem to be working. She tried stimulants in the past but could not take them. She reports erratic sleep, she would be sleep-deprived and get her days and nights  confused, other times she would sleep for 2 straight days. She was started on a new medication one time and became so confused with hallucinations, she went to the wrong dentist office.   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 Results show that despite evidence she has mild bifrontal lobe atrophy on imaging, it was felt that she does NOT have frontotemporal dementia based on 2 evaluations that do not fit the typical pattern of frontotemporal dementia despite exhibiting consistent difficulty with complex planning/organizing. Hypotheses include that without serial imaging before 2011,"it is not possible if these changes coincided with her significant cognitive and behavioral changes or are long-standing. However, based on the probability of having "frontal symptoms" due to this finding, it is likely that the structural brain changes are related, to some extent, to her having significant change in functioning. After review of history, there are a few possibilities. She was diagnosed with hepatitis C for many decades and has not undergone treatment, is it possible that she has minimal hepatic encephalopathy? This could also be related to the patient's complaint of extreme fatigue and treatment resistant depression. Based on a very high level of personal stress in 2011, did the patient have a "nervous breakdown" and her decompensation has been related solely to psychiatric stress specifically depression? There is literature to  suggest atrophy in frontal brain areas secondary to depression. The diagnosis of FTD is unlikely." Recommendations included ongoing psychiatric evaluation/treatment, CBT, treatment for hepatitis C.  She has a history of migraines occurring 5 days a week, with pain behind her eyes or  radiating from her neck, with uncomfortable pressure behind her eyes. Relpax helps if she takes it at the onset. If she wakes up with a migraine, she uses sumatriptan injections. She has chronic neck and back pain.   PAST MEDICAL HISTORY: Past Medical History:  Diagnosis Date  . Acute hepatitis C without mention of hepatic coma(070.51)    No treatment  . Brachial neuritis or radiculitis NOS   . Depressive disorder, not elsewhere classified   . Essential and other specified forms of tremor   . Headache(784.0)   . Memory loss   . Microscopic hematuria    seed dr Patsi Sears in past  . Migraine, unspecified, without mention of intractable migraine without mention of status migrainosus   . Other specified disorders of rotator cuff syndrome of shoulder and allied disorders   . Vulvar pain    rx with valium supp    MEDICATIONS:  Outpatient Encounter Prescriptions as of 01/28/2016  Medication Sig Note  . ALPRAZolam (XANAX) 1 MG tablet Take 1 mg by mouth 4 (four) times daily as needed for anxiety (shakes).   . benzonatate (TESSALON) 100 MG capsule Take 1 capsule (100 mg total) by mouth 2 (two) times daily as needed for cough.   . cetirizine (ZYRTEC) 10 MG tablet Take 10 mg by mouth daily as needed for allergies.   . cyclobenzaprine (FLEXERIL) 10 MG tablet Take 1 tablet (10 mg total) by mouth 2 (two) times daily as needed. Muscle spasms/headaches   . desvenlafaxine (PRISTIQ) 50 MG 24 hr tablet Take 50 mg by mouth daily.   Marland Kitchen dextroamphetamine (DEXEDRINE SPANSULE) 15 MG 24 hr capsule Take 15 mg by mouth daily.   Marland Kitchen doxycycline (VIBRA-TABS) 100 MG tablet Take 1 tablet (100 mg total) by mouth 2 (two) times daily.   Marland Kitchen eletriptan (RELPAX) 40 MG tablet Take 1 tablet (40 mg total) by mouth every 2 (two) hours as needed for migraine.   Marland Kitchen FLUoxetine (PROZAC) 20 MG capsule Take 1 capsule by mouth daily. 10/14/2014: Received from: External Pharmacy Received Sig: TAKE ONE CAPSULE BY MOUTH EVERY DAY FOR 2  WEEKS. THEN 2 CAPSULES DAILY  . gabapentin (NEURONTIN) 100 MG capsule Take 300 mg by mouth 3 (three) times daily as needed.  12/30/2015: Received from: External Pharmacy  . HARVONI 90-400 MG TABS Take 1 tablet by mouth daily for 8 weeks   . HYDROcodone-ibuprofen (VICOPROFEN) 7.5-200 MG per tablet Take 0.5-1 tablets by mouth 4 (four) times daily as needed for moderate pain or severe pain.    Marland Kitchen ketoconazole (NIZORAL) 2 % cream APPLY 1 APPLICATION TOPICALLY 2 (TWO) TIMES DAILY.   Marland Kitchen Misc Natural Products (ESTROVEN + ENERGY MAX STRENGTH) TABS Take by mouth daily.   . Multiple Vitamins-Minerals (CENTRUM SILVER ADULT 50+) TABS Take 1 tablet by mouth daily.   . SUMAtriptan 6 MG/0.5ML SOAJ Inject 6 mg into the skin daily as needed. Reported on 03/18/2015 12/15/2014: PRN   No facility-administered encounter medications on file as of 01/28/2016.      ALLERGIES: Allergies  Allergen Reactions  . Codeine Nausea And Vomiting and Other (See Comments)    Shuts down GI tract    FAMILY HISTORY: Family History  Problem Relation Age of Onset  . Stroke Father  father passed away june17  . Diabetes Maternal Grandmother   . Headache Mother   . Other Son     Mother is deceased 2011-06-01 father moved to the area 31-May-2012.    SOCIAL HISTORY: Social History   Social History  . Marital status: Divorced    Spouse name: N/A  . Number of children: 3  . Years of education: N/A   Occupational History  . Not on file.   Social History Main Topics  . Smoking status: Former Smoker    Types: Cigarettes    Quit date: 05/23/1978  . Smokeless tobacco: Never Used  . Alcohol use No  . Drug use: No  . Sexual activity: Not on file   Other Topics Concern  . 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 Dr. love.   She is gravida  3 para 3   Father passed away  07/11/22      REVIEW OF SYSTEMS: Constitutional: No fevers, chills, or sweats, generalized fatigue, change in appetite Eyes: No visual changes, double vision, eye pain Ear, nose and throat: No hearing loss, ear pain, nasal congestion, sore throat Cardiovascular: No chest pain, palpitations Respiratory:  No shortness of breath at rest or with exertion, wheezes GastrointestinaI: No nausea, vomiting, diarrhea, abdominal pain, fecal incontinence Genitourinary:  No dysuria, urinary retention or frequency Musculoskeletal:  + neck pain, back pain Integumentary: No rash, pruritus, skin lesions Neurological: as above Psychiatric: + depression, insomnia, anxiety Endocrine: No palpitations, fatigue, diaphoresis, mood swings, change in appetite, change in weight, increased thirst Hematologic/Lymphatic:  No anemia, purpura, petechiae. Allergic/Immunologic: no itchy/runny eyes, nasal congestion, recent allergic reactions, rashes  PHYSICAL EXAM: Vitals:   01/28/16 1537  BP: (!) 148/88  Pulse: (!) 108   General: distraught, in better spirits but gets tearful talking about her family issues Head:  Normocephalic/atraumatic Neck: supple, + paraspinal tenderness, full range of motion Heart:  Regular rate and rhythm Lungs:  Clear to auscultation bilaterally Back: No paraspinal tenderness Skin/Extremities: No rash, no edema Neurological Exam: alert and oriented to person, place, and time. No aphasia or dysarthria. Fund of knowledge is appropriate.  Recent and remote memory are intact.  Attention and concentration are normal.    Able to name objects and repeat phrases. Cranial nerves: Pupils equal, round. Extraocular movements intact. No facial asymmetry. Motor: Moves all extremities symmetrically.  Gait narrow-based and steady  IMPRESSION: This is a 61 yo LH woman with a history of migraines, treatment-resistant depression, hepatitis C, who had initially presented for a  diagnosis of presumed frontotemporal dementia. She has bilateral frontal atrophy on MRI, however PET scans have been inconclusive due to pre-existing atrophy. She underwent repeat Neuropsychological evaluation which did not indicate any evidence for frontotemporal dementia. It appears she has finally accepted this and is not seeking further testing as before. She presents today with continued migraines, she has been started on Gabapentin for neck pain, which may be maximized for migraines as well in the future. She takes 20 Relpax tablets a month and asks that this is continued for now as she continues to deal with depression and family issues. Continue follow-up with psychiatry and GI for the hepatitis C. I again discussed the reason for minimizing Relpax intake, she is aware of rebound headaches.  We have agreed to continue minimizing Relpax use, on her next visit we will start slowly tapering down her monthly supply to goal of 10/month. She uses Imitrex injections sparingly (less than once a month). She will follow-up in 1 year and knows to call for any changes.   Thank you for allowing me to participate in her care.  Please do not hesitate to call for any questions or concerns.  The duration of this appointment visit was 25 minutes of face-to-face time with the patient.  Greater than 50% of this time was spent in counseling, explanation of diagnosis, planning of further management, and coordination of care.   Patrcia Dolly, M.D.   CC: Dr. Fabian Sharp, Dr. [name]

## 2016-01-28 NOTE — Patient Instructions (Addendum)
Wishing you well! 1. Continue all your medications 2. Take Relpax as needed, minimize use as much as you can to 2-3 times a week 3. Follow-up in 1 year

## 2016-01-29 ENCOUNTER — Other Ambulatory Visit: Payer: Self-pay | Admitting: Neurology

## 2016-01-29 DIAGNOSIS — IMO0002 Reserved for concepts with insufficient information to code with codable children: Secondary | ICD-10-CM

## 2016-01-29 DIAGNOSIS — G43709 Chronic migraine without aura, not intractable, without status migrainosus: Secondary | ICD-10-CM

## 2016-02-01 ENCOUNTER — Telehealth: Payer: Self-pay | Admitting: Neurology

## 2016-02-01 NOTE — Telephone Encounter (Signed)
RX refill refused: RX sent 01/28/16.

## 2016-02-01 NOTE — Telephone Encounter (Signed)
Notified patient medication called in.

## 2016-02-01 NOTE — Telephone Encounter (Signed)
[facility] 03/26/1955. She said never received a paper prescription to take to the pharmacy [phone]. [facility] uses [facility] [address]. Her # is [phone]. She contacted the pharmacy and she said they denied it?  Thank you

## 2016-02-03 ENCOUNTER — Ambulatory Visit: Payer: 59

## 2016-02-21 ENCOUNTER — Other Ambulatory Visit: Payer: Self-pay | Admitting: Neurology

## 2016-02-21 DIAGNOSIS — G43709 Chronic migraine without aura, not intractable, without status migrainosus: Secondary | ICD-10-CM

## 2016-02-21 DIAGNOSIS — IMO0002 Reserved for concepts with insufficient information to code with codable children: Secondary | ICD-10-CM

## 2016-02-22 ENCOUNTER — Telehealth: Payer: Self-pay | Admitting: Neurology

## 2016-02-22 NOTE — Telephone Encounter (Signed)
[name] 05/20/1955. She is calling regarding her relpax medication. She said she picked them up but no refills on them. She is not sure what to do? Her # [phone] [phone]. [facility] [address]. Thank you

## 2016-02-22 NOTE — Telephone Encounter (Signed)
RX refill refused, pt requesting refill too soon.

## 2016-02-22 NOTE — Telephone Encounter (Signed)
Spoke with pharmacy to verify patient should have 11 refills. They state they had typed it in wrong. Patient notified.

## 2016-03-28 LAB — HM COLONOSCOPY

## 2016-03-30 ENCOUNTER — Encounter: Payer: Self-pay | Admitting: Family Medicine

## 2016-04-14 ENCOUNTER — Ambulatory Visit: Payer: 59

## 2016-05-06 ENCOUNTER — Ambulatory Visit: Payer: 59

## 2016-05-11 ENCOUNTER — Ambulatory Visit
Admission: RE | Admit: 2016-05-11 | Discharge: 2016-05-11 | Disposition: A | Discharge status: Home / Self Care | Payer: 59 | Source: Ambulatory Visit | Attending: Internal Medicine | Admitting: Internal Medicine

## 2016-05-11 DIAGNOSIS — Z1231 Encounter for screening mammogram for malignant neoplasm of breast: Secondary | ICD-10-CM

## 2016-06-17 NOTE — Progress Notes (Deleted)
No chief complaint on file.   HPI: Toni [name] 61 y.o.  Concern about salmonella ROS: See pertinent positives and negatives per HPI.  Past Medical History:  Diagnosis Date  . Acute hepatitis C without mention of hepatic coma(070.51)    No treatment  . Brachial neuritis or radiculitis NOS   . Depressive disorder, not elsewhere classified   . Essential and other specified forms of tremor   . Headache(784.0)   . Memory loss   . Microscopic hematuria    [name] in past  . Migraine, unspecified, without mention of intractable migraine without mention of status migrainosus   . Other specified disorders of rotator cuff syndrome of shoulder and allied disorders   . Vulvar pain    rx with valium supp    Family History  Problem Relation Age of Onset  . Headache Mother   . Stroke Father        father passed away june17  . Diabetes Maternal Grandmother   . Other Son        Mother is deceased 2013 father moved to the area 2014.    Social History   Social History  . Marital status: Divorced    Spouse name: N/A  . Number of children: 3  . Years of education: N/A   Social History Main Topics  . Smoking status: Former Smoker    Types: Cigarettes    Quit date: 05/23/1978  . Smokeless tobacco: Never Used  . Alcohol use No  . Drug use: No  . Sexual activity: Not on file   Other Topics Concern  . Not on file   Social History Narrative   Does not sleep well at night   Lives alone   Has two cats   On Environmental managerDisability   College graduate previous worked for [employer]. On disability for depression and short-term memory problems. Dr. love.   She is gravida 3 para 3   Father passed away  June 17       Outpatient Medications Prior to Visit  Medication Sig Dispense Refill  . ALPRAZolam (XANAX) 1 MG tablet Take 1 mg by mouth 4 (four) times daily as needed for anxiety (shakes).    . benzonatate (TESSALON) 100 MG capsule Take 1 capsule (100 mg total) by  mouth 2 (two) times daily as needed for cough. 20 capsule 1  . cetirizine (ZYRTEC) 10 MG tablet Take 10 mg by mouth daily as needed for allergies.    . cyclobenzaprine (FLEXERIL) 10 MG tablet Take 1 tablet (10 mg total) by mouth 2 (two) times daily as needed. Muscle spasms/headaches 40 tablet 0  . desvenlafaxine (PRISTIQ) 50 MG 24 hr tablet Take 50 mg by mouth daily.    Marland Kitchen. dextroamphetamine (DEXEDRINE SPANSULE) 15 MG 24 hr capsule Take 15 mg by mouth daily.    Marland Kitchen. doxycycline (VIBRA-TABS) 100 MG tablet Take 1 tablet (100 mg total) by mouth 2 (two) times daily. 14 tablet 0  . eletriptan (RELPAX) 40 MG tablet Take 1 tablet (40 mg total) by mouth every 2 (two) hours as needed for migraine. Take 1 tablet at onset of migraine. May take another dose after 2 hours. 20 tablet 11  . FLUoxetine (PROZAC) 20 MG capsule Take 1 capsule by mouth daily.  0  . gabapentin (NEURONTIN) 100 MG capsule Take 300 mg by mouth 3 (three) times daily as needed.     Marland Kitchen. HARVONI 90-400 MG TABS Take 1 tablet by mouth daily for  8 weeks    . HYDROcodone-ibuprofen (VICOPROFEN) 7.5-200 MG per tablet Take 0.5-1 tablets by mouth 4 (four) times daily as needed for moderate pain or severe pain.     Marland Kitchen ketoconazole (NIZORAL) 2 % cream APPLY 1 APPLICATION TOPICALLY 2 (TWO) TIMES DAILY. 30 g 0  . Misc Natural Products (ESTROVEN + ENERGY MAX STRENGTH) TABS Take by mouth daily.    . Multiple Vitamins-Minerals (CENTRUM SILVER ADULT 50+) TABS Take 1 tablet by mouth daily.    . SUMAtriptan 6 MG/0.5ML SOAJ Inject 6 mg into the skin daily as needed. Reported on 03/18/2015     No facility-administered medications prior to visit.      EXAM:  There were no vitals taken for this visit.  There is no height or weight on file to calculate BMI.  GENERAL: vitals reviewed and listed above, alert, oriented, appears well hydrated and in no acute distress HEENT: atraumatic, conjunctiva  clear, no obvious abnormalities on inspection of external nose and ears  OP : no lesion edema or exudate  NECK: no obvious masses on inspection palpation  LUNGS: clear to auscultation bilaterally, no wheezes, rales or rhonchi, good air movement CV: HRRR, no clubbing cyanosis or  peripheral edema nl cap refill  MS: moves all extremities without noticeable focal  abnormality PSYCH: pleasant and cooperative, no obvious depression or anxiety  ASSESSMENT AND PLAN:  Discussed the following assessment and plan:  No diagnosis found.  -Patient advised to return or notify health care team  if symptoms worsen ,persist or new concerns arise.  There are no Patient Instructions on file for this visit.   Neta Mends. Panosh M.D.

## 2016-06-20 ENCOUNTER — Ambulatory Visit: Payer: 59 | Admitting: Internal Medicine

## 2016-06-28 NOTE — Progress Notes (Deleted)
No chief complaint on file.   HPI: Toni [name] 61 y.o. come in for Chronic disease management   Depression hep c  Migraines  hld  ROS: See pertinent positives and negatives per HPI.  Past Medical History:  Diagnosis Date  . Acute hepatitis C without mention of hepatic coma(070.51)    No treatment  . Brachial neuritis or radiculitis NOS   . Depressive disorder, not elsewhere classified   . Essential and other specified forms of tremor   . Headache(784.0)   . Memory loss   . Microscopic hematuria    seed dr Patsi Sears in past  . Migraine, unspecified, without mention of intractable migraine without mention of status migrainosus   . Other specified disorders of rotator cuff syndrome of shoulder and allied disorders   . Vulvar pain    rx with valium supp    Family History  Problem Relation Age of Onset  . Headache Mother   . Stroke Father        father passed away june17  . Diabetes Maternal Grandmother   . Other Son        Mother is deceased 06-09-2011 father moved to the area 2012-06-08.    Social History   Social History  . Marital status: Divorced    Spouse name: N/A  . Number of children: 3  . Years of education: N/A   Social History Main Topics  . Smoking status: Former Smoker    Types: Cigarettes    Quit date: 05/23/1978  . Smokeless tobacco: Never Used  . Alcohol use No  . Drug use: No  . Sexual activity: Not on file   Other Topics Concern  . 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 Dr. love.   She is gravida 3 para 3   Father passed away  07-19-2022      Outpatient Medications Prior to Visit  Medication Sig Dispense Refill  . ALPRAZolam (XANAX) 1 MG tablet Take 1 mg by mouth 4 (four) times daily as needed for anxiety (shakes).    . benzonatate (TESSALON)  100 MG capsule Take 1 capsule (100 mg total) by mouth 2 (two) times daily as needed for cough. 20 capsule 1  . cetirizine (ZYRTEC) 10 MG tablet Take 10 mg by mouth daily as needed for allergies.    . cyclobenzaprine (FLEXERIL) 10 MG tablet Take 1 tablet (10 mg total) by mouth 2 (two) times daily as needed. Muscle spasms/headaches 40 tablet 0  . desvenlafaxine (PRISTIQ) 50 MG 24 hr tablet Take 50 mg by mouth daily.    Marland Kitchen dextroamphetamine (DEXEDRINE SPANSULE) 15 MG 24 hr capsule Take 15 mg by mouth daily.    Marland Kitchen doxycycline (VIBRA-TABS) 100 MG tablet Take 1 tablet (100 mg total) by mouth 2 (two) times daily. 14 tablet 0  . eletriptan (RELPAX) 40 MG tablet Take 1 tablet (40 mg total) by mouth every 2 (two) hours as needed for migraine. Take 1 tablet at onset of migraine. May take another dose after 2 hours. 20 tablet 11  . FLUoxetine (PROZAC) 20 MG capsule Take 1 capsule by mouth daily.  0  . gabapentin (NEURONTIN) 100 MG capsule Take 300 mg by mouth 3 (three) times daily as needed.     Marland Kitchen  HARVONI 90-400 MG TABS Take 1 tablet by mouth daily for 8 weeks    . HYDROcodone-ibuprofen (VICOPROFEN) 7.5-200 MG per tablet Take 0.5-1 tablets by mouth 4 (four) times daily as needed for moderate pain or severe pain.     Marland Kitchen. ketoconazole (NIZORAL) 2 % cream APPLY 1 APPLICATION TOPICALLY 2 (TWO) TIMES DAILY. 30 g 0  . Misc Natural Products (ESTROVEN + ENERGY MAX STRENGTH) TABS Take by mouth daily.    . Multiple Vitamins-Minerals (CENTRUM SILVER ADULT 50+) TABS Take 1 tablet by mouth daily.    . SUMAtriptan 6 MG/0.5ML SOAJ Inject 6 mg into the skin daily as needed. Reported on 03/18/2015     No facility-administered medications prior to visit.      EXAM:  There were no vitals taken for this visit.  There is no height or weight on file to calculate BMI.  GENERAL: vitals reviewed and listed above, alert, oriented, appears well hydrated and in no acute distress HEENT: atraumatic, conjunctiva  clear, no obvious  abnormalities on inspection of external nose and ears OP : no lesion edema or exudate  NECK: no obvious masses on inspection palpation  LUNGS: clear to auscultation bilaterally, no wheezes, rales or rhonchi, good air movement CV: HRRR, no clubbing cyanosis or  peripheral edema nl cap refill  MS: moves all extremities without noticeable focal  abnormality PSYCH: pleasant and cooperative, no obvious depression or anxiety Lab Results  Component Value Date   WBC 5.6 12/17/2015   HGB 14.1 12/17/2015   HCT 42.1 12/17/2015   PLT 220.0 12/17/2015   GLUCOSE 99 12/17/2015   CHOL 206 (H) 12/17/2015   TRIG 97.0 12/17/2015   HDL 47.10 12/17/2015   LDLCALC 140 (H) 12/17/2015   ALT 36 (H) 12/17/2015   AST 41 (H) 12/17/2015   NA 140 12/17/2015   K 3.7 12/17/2015   CL 103 12/17/2015   CREATININE 0.85 12/17/2015   BUN 22 12/17/2015   CO2 30 12/17/2015   TSH 3.88 12/17/2015   HGBA1C 5.4 06/22/2015   BP Readings from Last 3 Encounters:  01/28/16 (!) 148/88  01/26/16 (!) 154/110  12/30/15 (!) 132/92    ASSESSMENT AND PLAN:  Discussed the following assessment and plan:  Medication management  Hepatitis C virus infection without hepatic coma, unspecified chronicity  Essential hypertension? elevated BP readings  Persistent headaches  Major depressive disorder, single episode, moderate (HCC) Lipid liver  bp   Management  -Patient advised to return or notify health care team  if  new concerns arise.  There are no Patient Instructions on file for this visit.   Neta MendsWanda K. Ishani Goldwasser M.D.

## 2016-06-29 ENCOUNTER — Ambulatory Visit: Payer: 59 | Admitting: Internal Medicine

## 2016-07-04 ENCOUNTER — Telehealth: Payer: Self-pay | Admitting: Internal Medicine

## 2016-07-04 ENCOUNTER — Encounter: Payer: Self-pay | Admitting: Internal Medicine

## 2016-07-04 ENCOUNTER — Ambulatory Visit (INDEPENDENT_AMBULATORY_CARE_PROVIDER_SITE_OTHER): Payer: 59 | Admitting: Internal Medicine

## 2016-07-04 VITALS — BP 118/70 | HR 73 | Temp 97.9°F | Ht 62.0 in | Wt 192.0 lb

## 2016-07-04 DIAGNOSIS — J4541 Moderate persistent asthma with (acute) exacerbation: Secondary | ICD-10-CM

## 2016-07-04 MED ORDER — DOXYCYCLINE HYCLATE 100 MG PO TABS: 100.0000 mg | ORAL_TABLET | Freq: Two times a day (BID) | ORAL | 0 refills | Status: DC

## 2016-07-04 MED ORDER — ALBUTEROL SULFATE HFA 108 (90 BASE) MCG/ACT IN AERS: 2.0000 | INHALATION_SPRAY | Freq: Four times a day (QID) | RESPIRATORY_TRACT | 0 refills | Status: DC | PRN

## 2016-07-04 MED ORDER — BENZONATATE 100 MG PO CAPS: 100.0000 mg | ORAL_CAPSULE | Freq: Two times a day (BID) | ORAL | 1 refills | Status: DC | PRN

## 2016-07-04 NOTE — Progress Notes (Signed)
Subjective:    Patient ID: Toni [name], female    DOB: 1955/04/06, 61 y.o.   MRN: [id number]  HPI  61 year old patient who presents with a two-week history of worsening cough, congestion and wheezing.  No documented fever, but she is felt quite clammy and weak.  She is now producing thick, slightly discolored sputum.  She has no prior history of asthma but states that she has been treated with bronchodilators in the past.  Wheezing has intensified over the past 2 or 3 days.  She has been using DayQuil and TheraFlu with little benefit. She has a history of remote tobacco use but discontinued at age 61.  Past Medical History:  Diagnosis Date  . Acute hepatitis C without mention of hepatic coma(070.51)    No treatment  . Brachial neuritis or radiculitis NOS   . Depressive disorder, not elsewhere classified   . Essential and other specified forms of tremor   . Headache(784.0)   . Memory loss   . Microscopic hematuria    seed dr Patsi Sears in past  . Migraine, unspecified, without mention of intractable migraine without mention of status migrainosus   . Other specified disorders of rotator cuff syndrome of shoulder and allied disorders   . Vulvar pain    rx with valium supp     Social History   Social History  . Marital status: Divorced    Spouse name: N/A  . Number of children: 3  . Years of education: N/A   Occupational History  . Not on file.   Social History Main Topics  . Smoking status: Former Smoker    Types: Cigarettes    Quit date: 05/23/1978  . Smokeless tobacco: Never Used  . Alcohol use No  . Drug use: No  . Sexual activity: Not on file   Other Topics Concern  . 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 Dr. love.   She is gravida 3 para 3   Father  passed away  07/18/22      Past Surgical History:  Procedure Laterality Date  . BREAST EXCISIONAL BIOPSY Right    No scar  . CESAREAN SECTION     x3  . SHOULDER ARTHROSCOPY W/ ROTATOR CUFF REPAIR Right 2002/06/09  . SHOULDER ARTHROSCOPY W/ ROTATOR CUFF REPAIR Left 09-Jun-2006    Family History  Problem Relation Age of Onset  . Headache Mother   . Stroke Father        father passed away june17  . Diabetes Maternal Grandmother   . Other Son        Mother is deceased 06/09/11 father moved to the area 2012-06-08.    Allergies  Allergen Reactions  . Codeine Nausea And Vomiting and Other (See Comments)    Shuts down GI tract    Current Outpatient Prescriptions on File Prior to Visit  Medication Sig Dispense Refill  . ALPRAZolam (XANAX) 1 MG tablet Take 1 mg by mouth 4 (four) times daily as needed for anxiety (shakes).    . benzonatate (TESSALON) 100 MG capsule Take 1 capsule (100 mg total) by mouth 2 (two) times daily as needed for cough. 20 capsule 1  . cetirizine (ZYRTEC) 10 MG tablet Take 10 mg by mouth daily as needed  for allergies.    . cyclobenzaprine (FLEXERIL) 10 MG tablet Take 1 tablet (10 mg total) by mouth 2 (two) times daily as needed. Muscle spasms/headaches 40 tablet 0  . desvenlafaxine (PRISTIQ) 50 MG 24 hr tablet Take 50 mg by mouth daily.    Marland Kitchen dextroamphetamine (DEXEDRINE SPANSULE) 15 MG 24 hr capsule Take 15 mg by mouth daily.    Marland Kitchen doxycycline (VIBRA-TABS) 100 MG tablet Take 1 tablet (100 mg total) by mouth 2 (two) times daily. 14 tablet 0  . eletriptan (RELPAX) 40 MG tablet Take 1 tablet (40 mg total) by mouth every 2 (two) hours as needed for migraine. Take 1 tablet at onset of migraine. May take another dose after 2 hours. 20 tablet 11  . FLUoxetine (PROZAC) 20 MG capsule Take 1 capsule by mouth daily.  0  . gabapentin (NEURONTIN) 100 MG capsule Take 300 mg by mouth 3 (three) times daily as needed.     Marland Kitchen HYDROcodone-ibuprofen (VICOPROFEN) 7.5-200 MG per tablet Take 0.5-1 tablets by  mouth 4 (four) times daily as needed for moderate pain or severe pain.     Marland Kitchen ketoconazole (NIZORAL) 2 % cream APPLY 1 APPLICATION TOPICALLY 2 (TWO) TIMES DAILY. 30 g 0  . Misc Natural Products (ESTROVEN + ENERGY MAX STRENGTH) TABS Take by mouth daily.    . Multiple Vitamins-Minerals (CENTRUM SILVER ADULT 50+) TABS Take 1 tablet by mouth daily.    . SUMAtriptan 6 MG/0.5ML SOAJ Inject 6 mg into the skin daily as needed. Reported on 03/18/2015    . HARVONI 90-400 MG TABS Take 1 tablet by mouth daily for 8 weeks     No current facility-administered medications on file prior to visit.     BP 118/70 (BP Location: Left Arm, Patient Position: Sitting, Cuff Size: Normal)   Pulse 73   Temp 97.9 F (36.6 C) (Oral)   Ht 5\' 2"  (1.575 m)   Wt 192 lb (87.1 kg)   SpO2 96%   BMI 35.12 kg/m     Review of Systems  Constitutional: Positive for activity change, appetite change, diaphoresis and fatigue. Negative for chills and fever.  HENT: Positive for congestion, postnasal drip and voice change. Negative for dental problem, hearing loss, rhinorrhea, sinus pressure, sore throat and tinnitus.   Eyes: Negative for pain, discharge and visual disturbance.  Respiratory: Positive for cough, shortness of breath and wheezing.   Cardiovascular: Negative for chest pain, palpitations and leg swelling.  Gastrointestinal: Negative for abdominal distention, abdominal pain, blood in stool, constipation, diarrhea, nausea and vomiting.  Genitourinary: Negative for difficulty urinating, dysuria, flank pain, frequency, hematuria, pelvic pain, urgency, vaginal bleeding, vaginal discharge and vaginal pain.  Musculoskeletal: Negative for arthralgias, gait problem and joint swelling.  Skin: Negative for rash.  Neurological: Negative for dizziness, syncope, speech difficulty, weakness, numbness and headaches.  Hematological: Negative for adenopathy.  Psychiatric/Behavioral: Negative for agitation, behavioral problems and  dysphoric mood. The patient is not nervous/anxious.        Objective:   Physical Exam  Constitutional: She is oriented to person, place, and time. She appears well-developed and well-nourished. No distress.  Appears unwell, but in no acute distress Slightly clammy Frequent paroxysms of coughing Afebrile  O2 saturation 96%Pulse 73  HENT:  Head: Normocephalic.  Right Ear: External ear normal.  Left Ear: External ear normal.  Mouth/Throat: Oropharynx is clear and moist.  Eyes: Conjunctivae and EOM are normal. Pupils are equal, round, and reactive to light.  Neck: Normal range of motion. Neck  supple. No thyromegaly present.  Cardiovascular: Normal rate, regular rhythm, normal heart sounds and intact distal pulses.   Pulmonary/Chest: Effort normal.  Coarse rhonchi heard diffusely with scattered expiratory wheezing  Abdominal: Soft. Bowel sounds are normal. She exhibits no mass. There is no tenderness.  Musculoskeletal: Normal range of motion.  Lymphadenopathy:    She has no cervical adenopathy.  Neurological: She is alert and oriented to person, place, and time.  Skin: Skin is warm and dry. No rash noted.  Psychiatric: She has a normal mood and affect. Her behavior is normal.          Assessment & Plan:   Asthmatic bronchitis.  Patient states that she does not respond to a Z-Pak. We'll treat with doxycycline short course of albuterol and Tessalon  Follow-up PCP if unimproved in 2 days  Rogelia BogaKWIATKOWSKI,Othel Hoogendoorn FRANK

## 2016-07-04 NOTE — Patient Instructions (Addendum)
Take over-the-counter expectorants and cough medications such as  Mucinex DM.  Call if there is no improvement in 5 to 7 days or if  you develop worsening cough, fever, or new symptoms, such as shortness of breath or chest pain.  Take your antibiotic as prescribed until ALL of it is gone, but stop if you develop a rash, swelling, or any side effects of the medication.  Contact our office as soon as possible if  there are side effects of the medication.  Hydrate and Humidify  Drink enough water to keep your urine clear or pale yellow. Staying hydrated will help to thin your mucus.  Use a cool mist humidifier to keep the humidity level in your home above 50%.  Inhale steam for 10-15 minutes, 3-4 times a day or as told by your health care provider. You can do this in the bathroom while a hot shower is running.  Limit your exposure to cool or dry air. Rest  Rest as much as possible.  If unimproved in 3 days, please return for follow-up.  Report any new or worsening symptoms

## 2016-07-04 NOTE — Telephone Encounter (Signed)
Error

## 2016-07-08 ENCOUNTER — Encounter: Payer: Self-pay | Admitting: Internal Medicine

## 2016-07-08 ENCOUNTER — Ambulatory Visit (INDEPENDENT_AMBULATORY_CARE_PROVIDER_SITE_OTHER): Payer: 59 | Admitting: Internal Medicine

## 2016-07-08 VITALS — BP 100/92 | HR 86 | Temp 98.3°F | Wt 192.6 lb

## 2016-07-08 DIAGNOSIS — J4520 Mild intermittent asthma, uncomplicated: Secondary | ICD-10-CM

## 2016-07-08 MED ORDER — PREDNISONE 10 MG PO TABS: 10.0000 mg | ORAL_TABLET | Freq: Two times a day (BID) | ORAL | 0 refills | Status: DC

## 2016-07-08 NOTE — Progress Notes (Signed)
Subjective:    Patient ID: Toni [name], female    DOB: [birth year], 61 y.o.   MRN: [id number]  HPI  61 year old patient who was seen 4 days ago and treated for asthmatic bronchitis.  She has been on doxycycline, albuterol expectorants and antitussives.  She has been on chronic hydrocodone for pain, and Tessalon and Mucinex DM.  Has been added to her regimen Her chief complaint continues to be cough.  She feels unwell and clammy but no documented fever.  Cough is largely now nonproductive. She still notes occasional wheezing.  She is accompanied by her ex-husband today  Past Medical History:  Diagnosis Date  . Acute hepatitis C without mention of hepatic coma(070.51)    No treatment  . Brachial neuritis or radiculitis NOS   . Depressive disorder, not elsewhere classified   . Essential and other specified forms of tremor   . Headache(784.0)   . Memory loss   . Microscopic hematuria    seed dr Patsi Sears in past  . Migraine, unspecified, without mention of intractable migraine without mention of status migrainosus   . Other specified disorders of rotator cuff syndrome of shoulder and allied disorders   . Vulvar pain    rx with valium supp     Social History   Social History  . Marital status: Divorced    Spouse name: N/A  . Number of children: 3  . Years of education: N/A   Occupational History  . Not on file.   Social History Main Topics  . Smoking status: Former Smoker    Types: Cigarettes    Quit date: 05/23/1978  . Smokeless tobacco: Never Used  . Alcohol use No  . Drug use: No  . Sexual activity: Not on file   Other Topics Concern  . 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 Dr. love.   She is gravida 3 para 3   Father passed away  07/12/2022      Past  Surgical History:  Procedure Laterality Date  . BREAST EXCISIONAL BIOPSY Right    No scar  . CESAREAN SECTION     x3  . SHOULDER ARTHROSCOPY W/ ROTATOR CUFF REPAIR Right 2002-06-03  . SHOULDER ARTHROSCOPY W/ ROTATOR CUFF REPAIR Left 2006-06-03    Family History  Problem Relation Age of Onset  . Headache Mother   . Stroke Father        father passed away june17  . Diabetes Maternal Grandmother   . Other Son        Mother is deceased 06-03-2011 father moved to the area 06/02/2012.    Allergies  Allergen Reactions  . Codeine Nausea And Vomiting and Other (See Comments)    Shuts down GI tract    Current Outpatient Prescriptions on File Prior to Visit  Medication Sig Dispense Refill  . albuterol (PROVENTIL HFA;VENTOLIN HFA) 108 (90 Base) MCG/ACT inhaler Inhale 2 puffs into the lungs every 6 (six) hours as needed for wheezing or shortness of breath. 1 Inhaler 0  . ALPRAZolam (XANAX) 1 MG tablet Take 1 mg by mouth 4 (four) times daily as needed for anxiety (shakes).    . benzonatate (TESSALON) 100 MG capsule Take 1 capsule (100 mg total) by mouth 2 (two) times daily  as needed for cough. 20 capsule 1  . cetirizine (ZYRTEC) 10 MG tablet Take 10 mg by mouth daily as needed for allergies.    . cyclobenzaprine (FLEXERIL) 10 MG tablet Take 1 tablet (10 mg total) by mouth 2 (two) times daily as needed. Muscle spasms/headaches 40 tablet 0  . desvenlafaxine (PRISTIQ) 50 MG 24 hr tablet Take 50 mg by mouth daily.    Marland Kitchen dextroamphetamine (DEXEDRINE SPANSULE) 15 MG 24 hr capsule Take 15 mg by mouth daily.    Marland Kitchen doxycycline (VIBRA-TABS) 100 MG tablet Take 1 tablet (100 mg total) by mouth 2 (two) times daily. 20 tablet 0  . eletriptan (RELPAX) 40 MG tablet Take 1 tablet (40 mg total) by mouth every 2 (two) hours as needed for migraine. Take 1 tablet at onset of migraine. May take another dose after 2 hours. 20 tablet 11  . FLUoxetine (PROZAC) 20 MG capsule Take 1 capsule by mouth daily.  0  . gabapentin (NEURONTIN) 100 MG  capsule Take 300 mg by mouth 3 (three) times daily as needed.     Marland Kitchen HARVONI 90-400 MG TABS Take 1 tablet by mouth daily for 8 weeks    . HYDROcodone-ibuprofen (VICOPROFEN) 7.5-200 MG per tablet Take 0.5-1 tablets by mouth 4 (four) times daily as needed for moderate pain or severe pain.     Marland Kitchen ketoconazole (NIZORAL) 2 % cream APPLY 1 APPLICATION TOPICALLY 2 (TWO) TIMES DAILY. 30 g 0  . Misc Natural Products (ESTROVEN + ENERGY MAX STRENGTH) TABS Take by mouth daily.    . Multiple Vitamins-Minerals (CENTRUM SILVER ADULT 50+) TABS Take 1 tablet by mouth daily.    . SUMAtriptan 6 MG/0.5ML SOAJ Inject 6 mg into the skin daily as needed. Reported on 03/18/2015     No current facility-administered medications on file prior to visit.     BP (!) 100/92 (BP Location: Left Arm, Patient Position: Sitting, Cuff Size: Normal)   Pulse 86   Temp 98.3 F (36.8 C) (Oral)   Wt 192 lb 9.6 oz (87.4 kg)   SpO2 97%   BMI 35.23 kg/m     Review of Systems  Constitutional: Positive for activity change, appetite change, diaphoresis and fatigue. Negative for chills and fever.  HENT: Positive for congestion. Negative for dental problem, hearing loss, rhinorrhea, sinus pressure, sore throat and tinnitus.   Eyes: Negative for pain, discharge and visual disturbance.  Respiratory: Positive for cough, shortness of breath and wheezing.   Cardiovascular: Negative for chest pain, palpitations and leg swelling.  Gastrointestinal: Negative for abdominal distention, abdominal pain, blood in stool, constipation, diarrhea, nausea and vomiting.  Genitourinary: Negative for difficulty urinating, dysuria, flank pain, frequency, hematuria, pelvic pain, urgency, vaginal bleeding, vaginal discharge and vaginal pain.  Musculoskeletal: Negative for arthralgias, gait problem and joint swelling.  Skin: Negative for rash.  Neurological: Negative for dizziness, syncope, speech difficulty, weakness, numbness and headaches.  Hematological:  Negative for adenopathy.  Psychiatric/Behavioral: Negative for agitation, behavioral problems and dysphoric mood. The patient is not nervous/anxious.        Objective:   Physical Exam  Constitutional: She is oriented to person, place, and time. She appears well-developed and well-nourished. No distress.  HENT:  Head: Normocephalic.  Right Ear: External ear normal.  Left Ear: External ear normal.  Mouth/Throat: Oropharynx is clear and moist.  Eyes: Conjunctivae and EOM are normal. Pupils are equal, round, and reactive to light.  Neck: Normal range of motion. Neck supple. No thyromegaly present.  Cardiovascular: Normal  rate, regular rhythm, normal heart sounds and intact distal pulses.   Pulmonary/Chest: Effort normal and breath sounds normal. She has no wheezes.  Chest is clear today.  Wheezing has resolved O2 sat ration 97  Abdominal: Soft. Bowel sounds are normal. She exhibits no mass. There is no tenderness.  Musculoskeletal: Normal range of motion.  Lymphadenopathy:    She has no cervical adenopathy.  Neurological: She is alert and oriented to person, place, and time.  Skin: Skin is warm and dry. No rash noted.  Psychiatric: She has a normal mood and affect. Her behavior is normal.          Assessment & Plan:   Resolving asthmatic bronchitis.  Patient will complete antibiotic therapy.  Mucolytics and antitussives.  Continue albuterol when necessary  Patient will report any new symptoms or clinical worsening  Rogelia BogaKWIATKOWSKI,Ermagene Saidi FRANK

## 2016-07-08 NOTE — Patient Instructions (Addendum)
Drink as much fluid as you  can tolerate over the next few days  Prednisone 1 tablet twice daily for 7 days   Acute Bronchitis, Adult Acute bronchitis is when air tubes (bronchi) in the lungs suddenly get swollen. The condition can make it hard to breathe. It can also cause these symptoms:  A cough.  Coughing up clear, yellow, or green mucus.  Wheezing.  Chest congestion.  Shortness of breath.  A fever.  Body aches.  Chills.  A sore throat.  Follow these instructions at home: Medicines  Take over-the-counter and prescription medicines only as told by your doctor.  If you were prescribed an antibiotic medicine, take it as told by your doctor. Do not stop taking the antibiotic even if you start to feel better. General instructions  Rest.  Drink enough fluids to keep your pee (urine) clear or pale yellow.  Avoid smoking and secondhand smoke. If you smoke and you need help quitting, ask your doctor. Quitting will help your lungs heal faster.  Use an inhaler, cool mist vaporizer, or humidifier as told by your doctor.  Keep all follow-up visits as told by your doctor. This is important. How is this prevented? To lower your risk of getting this condition again:  Wash your hands often with soap and water. If you cannot use soap and water, use hand sanitizer.  Avoid contact with people who have cold symptoms.  Try not to touch your hands to your mouth, nose, or eyes.  Make sure to get the flu shot every year.  Contact a doctor if:  Your symptoms do not get better in 2 weeks. Get help right away if:  You cough up blood.  You have chest pain.  You have very bad shortness of breath.  You become dehydrated.  You faint (pass out) or keep feeling like you are going to pass out.  You keep throwing up (vomiting).  You have a very bad headache.  Your fever or chills gets worse. This information is not intended to replace advice given to you by your health care  provider. Make sure you discuss any questions you have with your health care provider. Document Released: 06/08/2007 Document Revised: 07/29/2015 Document Reviewed: 06/10/2015 Elsevier Interactive Patient Education  2017 [employer]

## 2016-09-12 ENCOUNTER — Telehealth: Payer: Self-pay | Admitting: Neurology

## 2016-09-12 NOTE — Telephone Encounter (Signed)
That is fine, pls send Rx for Imitrex  at onset of migraine. Do not take more than 3 tablets a week. Dispense #10 with 6 refills, thanks

## 2016-09-12 NOTE — Telephone Encounter (Signed)
PT called in regards to her medication and wanted to know if it can be changed to imitrex due to cost

## 2016-09-13 ENCOUNTER — Other Ambulatory Visit: Payer: Self-pay

## 2016-09-13 MED ORDER — SUMATRIPTAN SUCCINATE 25 MG PO TABS: 25.0000 mg | ORAL_TABLET | ORAL | 6 refills | Status: DC | PRN

## 2016-09-13 NOTE — Telephone Encounter (Signed)
Rx sent to pt's listed preferred pharmacy.  

## 2016-09-23 ENCOUNTER — Encounter: Payer: Self-pay | Admitting: Internal Medicine

## 2016-10-05 ENCOUNTER — Telehealth: Payer: Self-pay | Admitting: Neurology

## 2016-10-05 ENCOUNTER — Telehealth: Payer: Self-pay | Admitting: Internal Medicine

## 2016-10-05 NOTE — Telephone Encounter (Signed)
Patient fell twice and she fell on her head. She is saying that her Skull hurts. She said she has not "been herself" and she would like to be seen. Please Advise. Thanks

## 2016-10-05 NOTE — Telephone Encounter (Signed)
Pt is calling stating that she hit her head on 07/06/2016 and fell again 2 weeks ago her skull is very sore now nothing relieves the pain and she had to pick her head up off the pillow this morning and she has called neurosurgeon is away for 3 weeks and no other provider will not see her due to her being a pt of Dr. Gwenlyn Found and the neurology is booked out til March of 2019.  Pt does not want to go to the ER prefer to see someone in our office instead.  Pt would like to have a call back to discuss in detail.

## 2016-10-05 NOTE — Telephone Encounter (Signed)
Dr. Fabian Sharp approved for pt to come in tomorrow. Pt denies any trouble with speech since fall or an numbness in her limbs. Pt is aware of appt and had no additional questions at this time. Nothing further is needed

## 2016-10-05 NOTE — Telephone Encounter (Signed)
Spoke with pt to get further symptoms and information.  Pt states she fell back in July 4th and tripped on her steps and fell backwards and hit her hand, pt denies it being on concrete but states it was in the grass.Pt states she did not feel like she at the time needed to go to be seen. She states the pain lighten up and 2 1/2 weeks ago she fell again and hit her head on chair. She states the pain is worse, she states it does not feel like a head ache and knows its from her skull.  Pt denies having vision trouble or passing out. Pt is requesting a scan to assure there is no internal issues. Pt agreed to an OV to come in. Will have a scheduler call her back.

## 2016-10-05 NOTE — Telephone Encounter (Signed)
Spoke with pt.  She states that she fell back and hit her head on July 06, 2016.  She was unable to catch herself at all, and went "straight down on her skull".  She states that her skull has been hurting ever since.  It started to lighten up and 3 weeks ago she fell on the tile bathroom floor.  She was protecting an injured hand/arm and hit her head again.  She states that it is not a headache, but rather her actual skull.  She states that she can point to the exact spot where it hurts.  Pt asked for a sooner appointment with Dr. Karel Jarvis (pt is scheduled in Jan).  I let her know that Dr. Rosalyn Gess first available follow up appointment is not until December 31st and advised that she make her way to the ER as they may be able to get images sooner than if I were to send referral to [facility].  Pt reluctantly agreed.

## 2016-10-05 NOTE — Progress Notes (Signed)
Chief Complaint  Patient presents with  . Acute Visit    Pt here today to discuss pain in head from a fall from x 2 weeks ago, denies headache, blurried vision or trouble with speech    HPI: Toni [name] 61 y.o. comes in today because she fell twice and hit her head in the last 2 months and has pain in the skull. Back area and tender to touch   Can get shooting pains  Different than she has had before  See phone notes for further information.She denies a headache but just localized pain. She has a history of headaches in the past and cervical disc disease hepatitis C treated  July 4  Looking at   [facility] and looked up and was on phone  And fell back on grass .  Lost phone.  Had skull pain no loc ? If concussion. Went away and then 4 weeks ago. HW dustmopping.  Stairs .fell misstep.    Elbow  Bruising   No head injury   Then  Was on toilet    Leaning on thighs and got numb and phone rang  And got up  And hit floor white tile.  And chair falling.  No loc  Some heada gainst chair.   Then pain in skull again.  Not a headache.   Worse with  Bending   Down  To do work.   Called  Specialist.    Couldn't see her   Pain started days later   No balance issues new neuro sx .   No new neuro sx        ROS: See pertinent positives and negatives per HPI.  Past Medical History:  Diagnosis Date  . Acute hepatitis C without mention of hepatic coma(070.51)    No treatment  . Brachial neuritis or radiculitis NOS   . Depressive disorder, not elsewhere classified   . Essential and other specified forms of tremor   . Headache(784.0)   . Memory loss   . Microscopic hematuria    seed dr Patsi Sears in past  . Migraine, unspecified, without mention of intractable migraine without mention of status migrainosus   . Other specified disorders of rotator cuff syndrome of shoulder and allied disorders   . Vulvar pain    rx with valium supp    Family History  Problem Relation Age of Onset  .  Headache Mother   . Stroke Father        father passed away june17  . Diabetes Maternal Grandmother   . Other Son        Mother is deceased 2011/05/29 father moved to the area 05/28/12.    Social History   Social History  . Marital status: Divorced    Spouse name: N/A  . Number of children: 3  . Years of education: N/A   Social History Main Topics  . Smoking status: Former Smoker    Types: Cigarettes    Quit date: 05/23/1978  . Smokeless tobacco: Never Used  . Alcohol use No  . Drug use: No  . Sexual activity: Not Asked   Other Topics Concern  . 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 Dr. love.   She is gravida 3 para 3   Father passed  away  June 17       Outpatient Medications Prior to Visit  Medication Sig Dispense Refill  . albuterol (PROVENTIL HFA;VENTOLIN HFA) 108 (90 Base) MCG/ACT inhaler Inhale 2 puffs into the lungs every 6 (six) hours as needed for wheezing or shortness of breath. 1 Inhaler 0  . ALPRAZolam (XANAX) 1 MG tablet Take 1 mg by mouth 4 (four) times daily as needed for anxiety (shakes).    . benzonatate (TESSALON) 100 MG capsule Take 1 capsule (100 mg total) by mouth 2 (two) times daily as needed for cough. 20 capsule 1  . cetirizine (ZYRTEC) 10 MG tablet Take 10 mg by mouth daily as needed for allergies.    . cyclobenzaprine (FLEXERIL) 10 MG tablet Take 1 tablet (10 mg total) by mouth 2 (two) times daily as needed. Muscle spasms/headaches 40 tablet 0  . desvenlafaxine (PRISTIQ) 50 MG 24 hr tablet Take 50 mg by mouth daily.    Marland Kitchen dextroamphetamine (DEXEDRINE SPANSULE) 15 MG 24 hr capsule Take 15 mg by mouth daily.    Marland Kitchen FLUoxetine (PROZAC) 20 MG capsule Take 1 capsule by mouth daily.  0  . gabapentin (NEURONTIN) 100 MG capsule Take 300 mg by mouth 3 (three) times daily as needed.      Marland Kitchen HYDROcodone-ibuprofen (VICOPROFEN) 7.5-200 MG per tablet Take 0.5-1 tablets by mouth 4 (four) times daily as needed for moderate pain or severe pain.     Marland Kitchen ketoconazole (NIZORAL) 2 % cream APPLY 1 APPLICATION TOPICALLY 2 (TWO) TIMES DAILY. 30 g 0  . Misc Natural Products (ESTROVEN + ENERGY MAX STRENGTH) TABS Take by mouth daily.    . Multiple Vitamins-Minerals (CENTRUM SILVER ADULT 50+) TABS Take 1 tablet by mouth daily.    . SUMAtriptan (IMITREX) 25 MG tablet Take 1 tablet (25 mg total) by mouth as needed for migraine. May repeat in 2 hours if headache persists or recurs. 10 tablet 6  . eletriptan (RELPAX) 40 MG tablet Take 1 tablet (40 mg total) by mouth every 2 (two) hours as needed for migraine. Take 1 tablet at onset of migraine. May take another dose after 2 hours. (Patient not taking: Reported on 10/06/2016) 20 tablet 11  . HARVONI 90-400 MG TABS Take 1 tablet by mouth daily for 8 weeks    . doxycycline (VIBRA-TABS) 100 MG tablet Take 1 tablet (100 mg total) by mouth 2 (two) times daily. (Patient not taking: Reported on 10/06/2016) 20 tablet 0  . predniSONE (DELTASONE) 10 MG tablet Take 1 tablet (10 mg total) by mouth 2 (two) times daily with a meal. (Patient not taking: Reported on 10/06/2016) 14 tablet 0   No facility-administered medications prior to visit.      EXAM:  BP (!) 152/82 (BP Location: Right Arm, Patient Position: Sitting, Cuff Size: Normal)   Pulse (!) 109   Temp 98.1 F (36.7 C) (Oral)   Ht  (1.575 m)   Wt 201 lb 9.6 oz (91.4 kg)   SpO2 97%   BMI 36.87 kg/m   Body mass index is 36.87 kg/m.  GENERAL: vitals reviewed and listed above, alert, oriented, appears well hydrated and in no acute distress mild anxiety  Nl gait  HEENT: [place] tender area right occiput parietal boggy no bruise no mass effect  eoms ij  conjunctiva  clear, no obvious abnormalities on inspection of external nose and ears tm nad  Grey  Tongue midline  NECK: no obvious masses on inspection  palpation  No pint tenderness  CV: HRRR, no clubbing cyanosis or  peripheral edema nl cap refill  MS: moves all extremities without noticeable focal  Abnormality Neuro neg rhomberg no tremor  Nl gait  Motor  Non focal  PSYCH: pleasant and cooperative, nl speech   ASSESSMENT AND PLAN:  Discussed the following assessment and plan:  Injury of head, initial encounter - Plan: CT HEAD WO CONTRAST  Occipital pain - Plan: CT HEAD WO CONTRAST  Acute post-traumatic headache, not intractable - Plan: CT HEAD WO CONTRAST  Need for prophylactic vaccination and inoculation against influenza - Plan: Flu Vaccine QUAD 36+ mos IM (Fluarix & Fluzone Quad PF  Hx of fall - Plan: CT HEAD WO CONTRAST I suspect she has skull pain related to the 2 injuries. This could be a neuralgia but she does have a puffy boggy area on her right scalp. Because of the increasing pain over time which check CT scan to check bones and any unsuspected bleeding. If negative would treat this as a posttraumatic headache perhaps occipital neuralgia. May take a while to get better use ice may need to follow-up with neurology for opinion. We discussed fall and distraction. Each of these falls related to multiple issues going on I do not think an underlying new process. Ct scan today or tomorrow fu depending -Patient advised to return or notify health care team  if symptoms worsen ,persist or new concerns arise.  Patient Instructions  This could be  Occipital neuralgia or cervicogenic  Pain  Form the fall.   Consider seeing  Neuro or NS  If    Ongoing.   '           Aftan Vint K. Kimberlea Schlag M.D.

## 2016-10-06 ENCOUNTER — Ambulatory Visit (INDEPENDENT_AMBULATORY_CARE_PROVIDER_SITE_OTHER): Payer: 59 | Admitting: Internal Medicine

## 2016-10-06 ENCOUNTER — Ambulatory Visit (INDEPENDENT_AMBULATORY_CARE_PROVIDER_SITE_OTHER)
Admission: RE | Admit: 2016-10-06 | Discharge: 2016-10-06 | Disposition: A | Discharge status: Home / Self Care | Payer: 59 | Source: Ambulatory Visit | Attending: Internal Medicine | Admitting: Internal Medicine

## 2016-10-06 ENCOUNTER — Encounter: Payer: Self-pay | Admitting: Internal Medicine

## 2016-10-06 ENCOUNTER — Telehealth: Payer: Self-pay | Admitting: Internal Medicine

## 2016-10-06 VITALS — BP 152/82 | HR 109 | Temp 98.1°F | Ht 62.0 in | Wt 201.6 lb

## 2016-10-06 DIAGNOSIS — R519 Headache, unspecified: Secondary | ICD-10-CM

## 2016-10-06 DIAGNOSIS — Z23 Encounter for immunization: Secondary | ICD-10-CM

## 2016-10-06 DIAGNOSIS — G44319 Acute post-traumatic headache, not intractable: Secondary | ICD-10-CM

## 2016-10-06 DIAGNOSIS — S0990XA Unspecified injury of head, initial encounter: Secondary | ICD-10-CM | POA: Diagnosis not present

## 2016-10-06 DIAGNOSIS — Z9181 History of falling: Secondary | ICD-10-CM

## 2016-10-06 DIAGNOSIS — R51 Headache: Secondary | ICD-10-CM | POA: Diagnosis not present

## 2016-10-06 NOTE — Patient Instructions (Addendum)
This could be  Occipital neuralgia or cervicogenic  Pain  Form the fall.   Consider seeing  Neuro or NS  If    Ongoing.   '

## 2016-10-06 NOTE — Telephone Encounter (Signed)
I had Dr. Salomon Fick review pt's CT scan results and gave me the ok to reach out to patient. CT showed: No evidence for acute intracranial abnormalities.   Attempted to reach pt, no answer left vm to call back

## 2016-10-07 NOTE — Telephone Encounter (Signed)
Patient called stating she is returning jasmine's call.

## 2016-10-07 NOTE — Telephone Encounter (Signed)
I spoke with pt please see imaging result

## 2016-10-08 ENCOUNTER — Encounter: Payer: Self-pay | Admitting: Internal Medicine

## 2016-10-10 NOTE — Telephone Encounter (Signed)
I think you will get better from this but  wanted your specialists to review  With you   Because sometimes they  are the best  To help [place] see if  Any chronic  Or old changes  Are significant   To your health and planning .   Trying to  Help you with information    so future stress is lessened.      Injury prevention is the best . Focus on one task at a time.  No more falling!  Take care

## 2016-10-20 ENCOUNTER — Telehealth: Payer: Self-pay | Admitting: Internal Medicine

## 2016-10-20 NOTE — Telephone Encounter (Signed)
Pt states Dr Karel JarvisAquino office called today and she is going to see her tomorrow.

## 2016-10-20 NOTE — Telephone Encounter (Signed)
Pt was seen on 10/06/16 and still waiting on referral to specialist. Please put referral in

## 2016-10-21 ENCOUNTER — Encounter: Payer: Self-pay | Admitting: Neurology

## 2016-10-21 ENCOUNTER — Ambulatory Visit (INDEPENDENT_AMBULATORY_CARE_PROVIDER_SITE_OTHER): Payer: 59 | Admitting: Neurology

## 2016-10-21 VITALS — BP 130/68 | HR 107 | Ht 63.0 in | Wt 195.0 lb

## 2016-10-21 DIAGNOSIS — F411 Generalized anxiety disorder: Secondary | ICD-10-CM | POA: Diagnosis not present

## 2016-10-21 DIAGNOSIS — IMO0002 Reserved for concepts with insufficient information to code with codable children: Secondary | ICD-10-CM

## 2016-10-21 DIAGNOSIS — F321 Major depressive disorder, single episode, moderate: Secondary | ICD-10-CM

## 2016-10-21 DIAGNOSIS — R930 Abnormal findings on diagnostic imaging of skull and head, not elsewhere classified: Secondary | ICD-10-CM

## 2016-10-21 DIAGNOSIS — G43709 Chronic migraine without aura, not intractable, without status migrainosus: Secondary | ICD-10-CM | POA: Diagnosis not present

## 2016-10-21 DIAGNOSIS — M5481 Occipital neuralgia: Secondary | ICD-10-CM

## 2016-10-21 NOTE — Progress Notes (Signed)
NEUROLOGY FOLLOW UP OFFICE NOTE  Toni [name] [id number]  HISTORY OF PRESENT ILLNESS: Toni [name] was seen in follow-up in the neurology clinic on 10/21/2016.  The patient was last seen 9 months ago for migraines and presents for an earlier visit due to "skull pain" and tenderness after 2 falls in the past 3 months. She reports she is doing well neurologically, the migraines have been better. She has moved out of her house and found a townhouse, she has gained custody of her 77 year old grandson. She feels her memory is fine, and the her psychiatrist Dr. [name] has her on a regimen that is "wonderful." She is very verbose and difficult to redirect, emotional in the office, stating her skull is very tender, she can't touch it. She reports that she had fallen backward last 07/06/16 and hit the back of her head. She had pain and felt she had a concussion but started getting better until she moved into her townhouse and was cleaning the stairs when she slipped and injured her right arm. She states she did not hit her head. She fell another time trying to get to the phone and did not hit her head, but then around the time of her birthday, her "head issue" started coming back and progressively getting worse. She states it is not a headache but her skull is painful, she feels like there are little ants crawling. She is very emotional trying to touch the back of her head and reporting it is very painful. If she falls asleep and rolls on that side, the pain is so bad she has to pick her head up with both hands. She takes Vicoprofen TID for neck issues and this takes a while to help with the pain in her scalp. There is no nausea/vomiting, photo/phonophobia. She saw her PCP and had a head CT which I reviewed today as she is very concerned about the report. The impression states there is no acute intracranial abnormality, enlarged anterior extra-axial CSF spaces could be secondary to prominent  frontal atrophy versus chronic subdural effusions, focal hypodensity in the left frontal lobe white matter, suspected to represent chronic infarct, but new since 2014. I had an extensive discussion with her that these changes on the scan would not cause the symptoms she is having, however she is very concerned about the report of "fluid" in the brain. She is currently taking gabapentin 100mg  3 caps TID for neck pain with no side effects. She had insurance issues with Relpax, and is now on Imitrex prn.  She was initially seen for concern for diagnosis of frontotemporal dementia and migraines. Repeat Neuropsychological evaluation in 2016 indicated that despite evidence she has mild bifrontal lobe atrophy on imaging, it was felt that she does NOT have frontotemporal dementia based on 2 evaluations that do not fit the typical pattern of frontotemporal dementia despite exhibiting consistent difficulty with complex planning/organizing. Hypotheses include that without serial imaging before 2011,"it is not possible if these changes coincided with her significant cognitive and behavioral changes or are long-standing. However, based on the probability of having "frontal symptoms" due to this finding, it is likely that the structural brain changes are related, to some extent, to her having significant change in functioning. 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  HPI: This is a 61 yo LH woman with a history of migraines, treatment-resistant  depression, anxiety, hepatitis C, who had initially presented with a presumed diagnosis of frontotemporal dementia. She had previously been seeing neurologist Dr. [name], then she saw Dr. [name] at [facility] one time, followed by 2 visits with Dr. Conrad Grayson at [facility]. I will summarize her history for our records. She reports she  was high-performance, doing multiple things at the same time, until 2011 when she started having more difficulties at work. She had been unable to do things she used to do. She started having problems sleeping. She denied any problems with her memory. She had lack of stamina, word-finding difficulties, and had been going through stress with her son at that time. She had 2 brain MRI studies done (05/08/09 and 02/24/12) which I personally reviewed, showing mild atrophy in the bilateral frontal lobes. She had a PET scan in 2011 reported as normal. She underwent Neuropsychological evaluation with Dr. [name] in 2011 which did not identify areas of cognitive impairment. Her current level of emotional distress seems far higher than her reported longtime baseline of what sounds like mild depression and generalized anxiety. Recommendation was for continued psychological counseling and psychiatric services.  On review of Dr. [name] notes, her MOCA score was normal 28/30. He stated that she does not appear to the the disinhibition, apathy, or language disorder on exam which would be typical of frontotemporal dementia. He did a PET scan which showed decreased metabolic activity in regions of atrophy in the anterior frontal lobes, which confounds comparison of metabolism patterns with normal age-matched individuals. No pattern of hypometabolic changes to suggest a specific type of dementia on this exam. On her last visit with him in 10/2013, his assessment was Presumed frontotemporal dementia, serial imaging demonstrated frontal atrophy bilaterally. PET was inconclusive due to pre-existing frontal atrophy. MOCA score stable at 28/30. He suggested repeating Neuropsychological testing. They discussed that SSRIs may be helpful for behavioral abnormalities. She had been taking Fetzima at that time, and was switched to Pristiq because she was reporting different side effects. She has been seeing Dr. [name] for many years and  reports trying different medications. Dr. [name] had noted that she has cognitive impairment caused by frontotemporal dementia and depression. Depression has been treatment-resistant. She has extreme profound inability to sustain attention, process information, modulate information. [name] reports that for the past few months, she had difficulty getting out of bed. She was tearful in the office today, stating she is "totally afraid to make commitments" and says "I have no purpose in life." She went from no desire to do anything, to extreme anxiety of having to do something. She asks about getting home therapy to help get her out of bed and go down to eat, otherwise she would stay in bed. Her fear is that she is getting worse and her children will see that this is how she is. She just wants to "give myself the best chance of getting my life back." She feels Pristiq does not seem to be working. She tried stimulants in the past but could not take them. She reports erratic sleep, she would be sleep-deprived and get her days and nights confused, other times she would sleep for 2 straight days. She was started on a new medication one time and became so confused with hallucinations, she went to the wrong dentist office.   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  Results show that despite evidence she has mild bifrontal lobe atrophy on imaging, it was felt that she does NOT have frontotemporal dementia based on 2 evaluations that do not fit the typical pattern of frontotemporal dementia despite exhibiting consistent difficulty with complex planning/organizing. Hypotheses include that without serial imaging before 2011,"it is not possible if these changes coincided with her significant cognitive and behavioral  changes or are long-standing. However, based on the probability of having "frontal symptoms" due to this finding, it is likely that the structural brain changes are related, to some extent, to her having significant change in functioning. After review of history, there are a few possibilities. She was diagnosed with hepatitis C for many decades and has not undergone treatment, is it possible that she has minimal hepatic encephalopathy? This could also be related to the patient's complaint of extreme fatigue and treatment resistant depression. Based on a very high level of personal stress in 2011, did the patient have a "nervous breakdown" and her decompensation has been related solely to psychiatric stress specifically depression? There is literature to suggest atrophy in frontal brain areas secondary to depression. The diagnosis of FTD is unlikely." Recommendations included ongoing psychiatric evaluation/treatment, CBT, treatment for hepatitis C.  She has a history of migraines occurring 5 days a week, with pain behind her eyes or radiating from her neck, with uncomfortable pressure behind her eyes. Relpax helps if she takes it at the onset. If she wakes up with a migraine, she uses sumatriptan injections. She has chronic neck and back pain.    PAST MEDICAL HISTORY: Past Medical History:  Diagnosis Date  . Acute hepatitis C without mention of hepatic coma(070.51)    No treatment  . Brachial neuritis or radiculitis NOS   . Depressive disorder, not elsewhere classified   . Essential and other specified forms of tremor   . Headache(784.0)   . Memory loss   . Microscopic hematuria    seed dr Patsi Sears in past  . Migraine, unspecified, without mention of intractable migraine without mention of status migrainosus   . Other specified disorders of rotator cuff syndrome of shoulder and allied disorders   . Vulvar pain    rx with valium supp    MEDICATIONS:  Outpatient Encounter Prescriptions as of  10/21/2016  Medication Sig Note  . albuterol (PROVENTIL HFA;VENTOLIN HFA) 108 (90 Base) MCG/ACT inhaler Inhale 2 puffs into the lungs every 6 (six) hours as needed for wheezing or shortness of breath.   . ALPRAZolam (XANAX) 1 MG tablet Take 1 mg by mouth 4 (four) times daily as needed for anxiety (shakes).   . benzonatate (TESSALON) 100 MG capsule Take 1 capsule (100 mg total) by mouth 2 (two) times daily as needed for cough.   . cetirizine (ZYRTEC) 10 MG tablet Take 10 mg by mouth daily as needed for allergies.   . cyclobenzaprine (FLEXERIL) 10 MG tablet Take 1 tablet (10 mg total) by mouth 2 (two) times daily as needed. Muscle spasms/headaches   . desvenlafaxine (PRISTIQ) 50 MG 24 hr tablet Take 50 mg by mouth daily.   Marland Kitchen dextroamphetamine (DEXEDRINE SPANSULE) 15 MG 24 hr capsule Take 15 mg by mouth daily.   Marland Kitchen eletriptan (RELPAX) 40 MG tablet Take 1 tablet (40 mg total) by mouth every 2 (two) hours as needed for migraine. Take 1 tablet at onset of migraine. May take another dose after 2 hours. (Patient not taking: Reported on 10/06/2016)   . FLUoxetine (PROZAC) 20 MG capsule Take 1  capsule by mouth daily. 10/14/2014: Received from: External Pharmacy Received Sig: TAKE ONE CAPSULE BY MOUTH EVERY DAY FOR 2 WEEKS. THEN 2 CAPSULES DAILY  . gabapentin (NEURONTIN) 100 MG capsule Take 300 mg by mouth 3 (three) times daily as needed.  12/30/2015: Received from: External Pharmacy  . HARVONI 90-400 MG TABS Take 1 tablet by mouth daily for 8 weeks   . HYDROcodone-ibuprofen (VICOPROFEN) 7.5-200 MG per tablet Take 0.5-1 tablets by mouth 4 (four) times daily as needed for moderate pain or severe pain.    Marland Kitchen. ketoconazole (NIZORAL) 2 % cream APPLY 1 APPLICATION TOPICALLY 2 (TWO) TIMES DAILY.   Marland Kitchen. Misc Natural Products (ESTROVEN + ENERGY MAX STRENGTH) TABS Take by mouth daily.   . Multiple Vitamins-Minerals (CENTRUM SILVER ADULT 50+) TABS Take 1 tablet by mouth daily.   . SUMAtriptan (IMITREX) 25 MG tablet Take 1  tablet (25 mg total) by mouth as needed for migraine. May repeat in 2 hours if headache persists or recurs.    No facility-administered encounter medications on file as of 10/21/2016.      ALLERGIES: Allergies  Allergen Reactions  . Codeine Nausea And Vomiting and Other (See Comments)    Shuts down GI tract    FAMILY HISTORY: Family History  Problem Relation Age of Onset  . Headache Mother   . Stroke Father        father passed away june17  . Diabetes Maternal Grandmother   . Other Son        Mother is deceased 2013 father moved to the area 2014.    SOCIAL HISTORY: Social History   Social History  . Marital status: Divorced    Spouse name: N/A  . Number of children: 3  . Years of education: N/A   Occupational History  . Not on file.   Social History Main Topics  . Smoking status: Former Smoker    Types: Cigarettes    Quit date: 05/23/1978  . Smokeless tobacco: Never Used  . Alcohol use No  . Drug use: No  . Sexual activity: Not on file   Other Topics Concern  . Not on file   Social History Narrative   Does not sleep well at night   Lives alone   Has two cats   On Environmental managerDisability   College graduate previous worked for [employer]. On disability for depression and short-term memory problems. Dr. love.   She is gravida 3 para 3   Father passed away  June 17       REVIEW OF SYSTEMS: Constitutional: No fevers, chills, or sweats, generalized fatigue, change in appetite Eyes: No visual changes, double vision, eye pain Ear, nose and throat: No hearing loss, ear pain, nasal congestion, sore throat Cardiovascular: No chest pain, palpitations Respiratory:  No shortness of breath at rest or with exertion, wheezes GastrointestinaI: No nausea, vomiting, diarrhea, abdominal pain, fecal incontinence Genitourinary:  No dysuria, urinary retention or frequency Musculoskeletal:  + neck pain, back pain Integumentary: No rash, pruritus, skin lesions Neurological: as  above Psychiatric: + depression, insomnia, anxiety Endocrine: No palpitations, fatigue, diaphoresis, mood swings, change in appetite, change in weight, increased thirst Hematologic/Lymphatic:  No anemia, purpura, petechiae. Allergic/Immunologic: no itchy/runny eyes, nasal congestion, recent allergic reactions, rashes  PHYSICAL EXAM: Vitals:   10/21/16 1405  BP: 130/68  Pulse: (!) 107  SpO2: 93%   General: distraught in the office reporting head tenderness, labile emotions Head:  Normocephalic/atraumatic, no skin changes in area of tenderness, +tenderness to palpation  over the right parieto-occipital region. Touching the left side causes tingling on the right side Neck: supple, + paraspinal tenderness, full range of motion Heart:  Regular rate and rhythm Lungs:  Clear to auscultation bilaterally Back: No paraspinal tenderness Skin/Extremities: No rash, no edema Neurological Exam: alert and oriented to person, place, and time. No aphasia or dysarthria. Fund of knowledge is appropriate.  Recent and remote memory are intact.  Attention and concentration are normal.    Able to name objects and repeat phrases. Cranial nerves: Pupils equal, round. Extraocular movements intact. No facial asymmetry. Motor: Moves all extremities symmetrically.  Gait narrow-based and steady Right tender to touch, no skin changes  IMPRESSION: This is a 61 yo LH woman with a history of migraines, treatment-resistant depression, hepatitis C, who had initially presented for a diagnosis of presumed frontotemporal dementia. She has bilateral frontal atrophy on MRI, however PET scans have been inconclusive due to pre-existing atrophy. She underwent repeat Neuropsychological evaluation which did not indicate any evidence for frontotemporal dementia. It appears she has finally accepted that there is no evidence of FTD. She presents today for new tenderness over the right parieto-occipital region that occurred after recent falls.  She is very emotional and reports significant tenderness. Symptoms suggestive of occipital neuralgia. She had a head CT which showed the frontal atrophy previously reported on prior scans, however there was note that they could be chronic subdural effusions, which is making her very concerned she has fluid in the brain. No amount of explaining could reassure her that this would not be the cause of her symptoms. Head CT also shows a chronic infarct in the left frontal region, new since 2014. An MRI brain with and without contrast will be ordered to further delineate these head CT findings and help reassure patient. She does not want to do occipital nerve blocks at this time, which could potentially help with her symptoms, but may consider this after MRI brain. She is agreeable to increasing gabapentin to 300-300-600mg , and will let her Neurosurgeon know. She reports her migraines are better, takes prn Imitrex. Continue follow-up psychiatry. She will follow-up in 4 months and knows to call for any changes.   Thank you for allowing me to participate in her care.  Please do not hesitate to call for any questions or concerns.  The duration of this appointment visit was 25 minutes of face-to-face time with the patient.  Greater than 50% of this time was spent in counseling, explanation of diagnosis, planning of further management, and coordination of care.   Patrcia Dolly, M.D.   CC: Dr. Fabian Sharp, Dr. [name], Dr. Newell [name]

## 2016-10-21 NOTE — Patient Instructions (Addendum)
1. Schedule MRI brain with and without contrast  [facility] and they will call you directly to schedule your appt. They are located at [address] If you need to contact them directly please call [phone].   2. Increase Gabapentin to 300mg  in AM, 300mg  at noon, then 600mg  at night 3. After MRI, we will schedule you for the nerve block to help with occipital neuralgia 4. Follow-up in 4 months, call for any changes

## 2016-10-24 NOTE — Telephone Encounter (Signed)
Noted  

## 2016-10-25 ENCOUNTER — Telehealth: Payer: Self-pay

## 2016-10-25 NOTE — Telephone Encounter (Signed)
-----   Message from Van ClinesKaren M Aquino, MD sent at 10/21/2016  3:24 PM EDT ----- Pls fax note to her psychiatrist, Dr. Evelene CroonKaur, thanks

## 2016-10-25 NOTE — Telephone Encounter (Signed)
Notes faxed via EPIC  

## 2016-11-05 ENCOUNTER — Ambulatory Visit
Admission: RE | Admit: 2016-11-05 | Discharge: 2016-11-05 | Disposition: A | Discharge status: Home / Self Care | Payer: 59 | Source: Ambulatory Visit | Attending: Neurology | Admitting: Neurology

## 2016-11-05 DIAGNOSIS — R930 Abnormal findings on diagnostic imaging of skull and head, not elsewhere classified: Secondary | ICD-10-CM

## 2016-11-05 DIAGNOSIS — F321 Major depressive disorder, single episode, moderate: Secondary | ICD-10-CM

## 2016-11-05 DIAGNOSIS — IMO0002 Reserved for concepts with insufficient information to code with codable children: Secondary | ICD-10-CM

## 2016-11-05 DIAGNOSIS — M5481 Occipital neuralgia: Secondary | ICD-10-CM

## 2016-11-05 DIAGNOSIS — F411 Generalized anxiety disorder: Secondary | ICD-10-CM

## 2016-11-05 DIAGNOSIS — G43709 Chronic migraine without aura, not intractable, without status migrainosus: Secondary | ICD-10-CM

## 2016-11-05 MED ORDER — GADOBENATE DIMEGLUMINE 529 MG/ML IV SOLN
18.0000 mL | Freq: Once | INTRAVENOUS | Status: AC | PRN
  Administered 2016-11-05: 18 mL via INTRAVENOUS

## 2016-11-11 ENCOUNTER — Telehealth: Payer: Self-pay

## 2016-11-11 NOTE — Telephone Encounter (Signed)
Spoke with pt relaying message below.  She has viewed her MRI and CT reports on MyChart, and disagrees with Dr. Karel JarvisAquino about her scans being similar to previous scans, as her CT impression states "Focal hypodensity in the left frontal lobe white matter, suspected to represent chronic infarct, but new since 2014"  She seems very worried about this.   She states that she is seeing the pain management clinic, and has increased her Gabapentin from 300mg  TID to 600mg  TID.  She is still in pain, but not to the point of screaming in agony every time something touches her head.  She does not wish to proceed with Nerve Block at this time, as she is more scared of the pain from the "getting stuck in the head than the pain of her head that she has grown accustom to".  Pt did express that she wishes to use medical marijuana or CBD oil, knows that it is illegal in [place].  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

## 2016-11-11 NOTE — Telephone Encounter (Signed)
-----   Message from Van ClinesKaren M Aquino, MD sent at 11/07/2016  8:53 AM EST ----- Pls let her know the MRI brain is similar to prior scans, there is no fluid seen in her brain. If she wants to proceed with doing nerve blocks for the irritated nerves in her scalp causing the head pain, pls schedule with Dr. Everlena CooperJaffe, thanks

## 2016-12-23 ENCOUNTER — Other Ambulatory Visit: Payer: 59

## 2016-12-29 NOTE — Progress Notes (Deleted)
No chief complaint on file.   HPI: Patient  Toni [name]  61 y.o. comes in today for Preventive Health Care visit  And fu Chronic disease management  Hep C    Memory    Health Maintenance  Topic Date Due  . HIV Screening  10/03/1968  . TETANUS/TDAP  10/03/1972  . MAMMOGRAM  05/21/2018  . PAP SMEAR  12/30/2018  . COLONOSCOPY  03/29/2026  . INFLUENZA VACCINE  Completed  . Hepatitis C Screening  Completed   Health Maintenance Review LIFESTYLE:  Exercise:   Tobacco/ETS: Alcohol:  Sugar beverages: Sleep: Drug use: no HH of  Work:    ROS:  GEN/ HEENT: No fever, significant weight changes sweats headaches vision problems hearing changes, CV/ PULM; No chest pain shortness of breath cough, syncope,edema  change in exercise tolerance. GI /GU: No adominal pain, vomiting, change in bowel habits. No blood in the stool. No significant GU symptoms. SKIN/HEME: ,no acute skin rashes suspicious lesions or bleeding. No lymphadenopathy, nodules, masses.  NEURO/ PSYCH:  No neurologic signs such as weakness numbness. No depression anxiety. IMM/ Allergy: No unusual infections.  Allergy .   REST of 12 system review negative except as per HPI   Past Medical History:  Diagnosis Date  . Acute hepatitis C without mention of hepatic coma(070.51)    No treatment  . Brachial neuritis or radiculitis NOS   . Depressive disorder, not elsewhere classified   . Essential and other specified forms of tremor   . Headache(784.0)   . Memory loss   . Microscopic hematuria    seed dr Patsi Sears in past  . Migraine, unspecified, without mention of intractable migraine without mention of status migrainosus   . Other specified disorders of rotator cuff syndrome of shoulder and allied disorders   . Vulvar pain    rx with valium supp    Past Surgical History:  Procedure Laterality Date  . BREAST EXCISIONAL BIOPSY Right    No scar  . CESAREAN SECTION     x3  . SHOULDER ARTHROSCOPY  W/ ROTATOR CUFF REPAIR Right 05-21-02  . SHOULDER ARTHROSCOPY W/ ROTATOR CUFF REPAIR Left 2006-05-21    Family History  Problem Relation Age of Onset  . Headache Mother   . Stroke Father        father passed away june17  . Diabetes Maternal Grandmother   . Other Son        Mother is deceased 05-21-2011 father moved to the area 2012/05/20.    Social History   Socioeconomic History  . Marital status: Divorced    Spouse name: Not on file  . Number of children: 3  . Years of education: Not on file  . Highest education level: Not on file  Social Needs  . Financial resource strain: Not on file  . Food insecurity - worry: Not on file  . Food insecurity - inability: Not on file  . Transportation needs - medical: Not on file  . Transportation needs - non-medical: Not on file  Occupational History  . Not on file  Tobacco Use  . Smoking status: Former Smoker    Types: Cigarettes    Last attempt to quit: 05/23/1978    Years since quitting: 38.6  . Smokeless tobacco: Never Used  Substance and Sexual Activity  . Alcohol use: No    Alcohol/week: 0.0 oz  . Drug use: No  . Sexual activity: Not on file  Other Topics Concern  . Not on file  Social History Narrative   Does not sleep well at night   Lives alone   Has two cats   On Environmental managerDisability   College graduate previous worked for [employer]. On disability for depression and short-term memory problems. Dr. love.   She is gravida 3 para 3   Father passed away  June 17    Outpatient Medications Prior to Visit  Medication Sig Dispense Refill  . albuterol (PROVENTIL HFA;VENTOLIN HFA) 108 (90 Base) MCG/ACT inhaler Inhale 2 puffs into the lungs every 6 (six) hours as needed for wheezing or shortness of breath. 1 Inhaler 0  . ALPRAZolam (XANAX) 1 MG tablet Take 1 mg by mouth 4 (four) times daily as needed for anxiety (shakes).    . benzonatate (TESSALON) 100 MG capsule Take 1 capsule (100 mg total) by mouth 2 (two) times daily as needed for cough. 20  capsule 1  . cetirizine (ZYRTEC) 10 MG tablet Take 10 mg by mouth daily as needed for allergies.    . cyclobenzaprine (FLEXERIL) 10 MG tablet Take 1 tablet (10 mg total) by mouth 2 (two) times daily as needed. Muscle spasms/headaches 40 tablet 0  . desvenlafaxine (PRISTIQ) 50 MG 24 hr tablet Take 50 mg by mouth daily.    Marland Kitchen. dextroamphetamine (DEXEDRINE SPANSULE) 15 MG 24 hr capsule Take 15 mg by mouth daily.    Marland Kitchen. eletriptan (RELPAX) 40 MG tablet Take 1 tablet (40 mg total) by mouth every 2 (two) hours as needed for migraine. Take 1 tablet at onset of migraine. May take another dose after 2 hours. 20 tablet 11  . FLUoxetine (PROZAC) 20 MG capsule Take 1 capsule by mouth daily.  0  . gabapentin (NEURONTIN) 100 MG capsule Take 300 mg by mouth 3 (three) times daily as needed.     Marland Kitchen. HARVONI 90-400 MG TABS Take 1 tablet by mouth daily for 8 weeks    . HYDROcodone-ibuprofen (VICOPROFEN) 7.5-200 MG per tablet Take 0.5-1 tablets by mouth 4 (four) times daily as needed for moderate pain or severe pain.     Marland Kitchen. ketoconazole (NIZORAL) 2 % cream APPLY 1 APPLICATION TOPICALLY 2 (TWO) TIMES DAILY. 30 g 0  . Misc Natural Products (ESTROVEN + ENERGY MAX STRENGTH) TABS Take by mouth daily.    . Multiple Vitamins-Minerals (CENTRUM SILVER ADULT 50+) TABS Take 1 tablet by mouth daily.    . SUMAtriptan (IMITREX) 25 MG tablet Take 1 tablet (25 mg total) by mouth as needed for migraine. May repeat in 2 hours if headache persists or recurs. 10 tablet 6   No facility-administered medications prior to visit.      EXAM:  There were no vitals taken for this visit.  There is no height or weight on file to calculate BMI. Wt Readings from Last 3 Encounters:  10/21/16 195 lb (88.5 kg)  10/06/16 201 lb 9.6 oz (91.4 kg)  07/08/16 192 lb 9.6 oz (87.4 kg)    Physical Exam: Vital signs reviewed RUE:AVWUGEN:This is a well-developed well-nourished alert cooperative    who appearsr stated age in no acute distress.  HEENT:  normocephalic atraumatic , Eyes: PERRL EOM's full, conjunctiva clear, Nares: paten,t no deformity discharge or tenderness., Ears: no deformity EAC's clear TMs with normal landmarks. Mouth: clear OP, no lesions, edema.  Moist mucous membranes. Dentition in adequate repair. NECK: supple without masses, thyromegaly or bruits. CHEST/PULM:  Clear to auscultation and percussion breath sounds equal no wheeze , rales or rhonchi. No chest wall deformities or tenderness. Breast:  normal by inspection . No dimpling, discharge, masses, tenderness or discharge . CV: PMI is nondisplaced, S1 S2 no gallops, murmurs, rubs. Peripheral pulses are full without delay.No JVD .  ABDOMEN: Bowel sounds normal nontender  No guard or rebound, no hepato splenomegal no CVA tenderness.  No hernia. Extremtities:  No clubbing cyanosis or edema, no acute joint swelling or redness no focal atrophy NEURO:  Oriented x3, cranial nerves 3-12 appear to be intact, no obvious focal weakness,gait within normal limits no abnormal reflexes or asymmetrical SKIN: No acute rashes normal turgor, color, no bruising or petechiae. PSYCH: Oriented, good eye contact, no obvious depression anxiety, cognition and judgment appear normal. LN: no cervical axillary inguinal adenopathy  Lab Results  Component Value Date   WBC 5.6 12/17/2015   HGB 14.1 12/17/2015   HCT 42.1 12/17/2015   PLT 220.0 12/17/2015   GLUCOSE 99 12/17/2015   CHOL 206 (H) 12/17/2015   TRIG 97.0 12/17/2015   HDL 47.10 12/17/2015   LDLCALC 140 (H) 12/17/2015   ALT 36 (H) 12/17/2015   AST 41 (H) 12/17/2015   NA 140 12/17/2015   K 3.7 12/17/2015   CL 103 12/17/2015   CREATININE 0.85 12/17/2015   BUN 22 12/17/2015   CO2 30 12/17/2015   TSH 3.88 12/17/2015   HGBA1C 5.4 06/22/2015    BP Readings from Last 3 Encounters:  10/21/16 130/68  10/06/16 (!) 152/82  07/08/16 (!) 100/92    Lab r  ASSESSMENT AND PLAN:  Discussed the following assessment and plan:  Visit for  preventive health examination  Medication management  Mild intermittent asthmatic bronchitis without complication  Hepatitis C virus infection without hepatic coma, unspecified chronicity  Abnormal LFTs  Subclinical hypothyroidism  Memory difficulties  Hyperlipidemia, unspecified hyperlipidemia type Lab  Planned  Patient Care Team: Madelin HeadingsPanosh, Shakendra Griffeth K, MD as PCP - General (Internal Medicine) Shirlean KellyNudelman, Robert, MD as Consulting Physician (Neurosurgery) Jethro Bolusannenbaum, Sigmund, MD as Consulting Physician (Urology) Milagros EvenerKaur, Rupinder, MD as Consulting Physician (Psychiatry) Van ClinesAquino, Karen M, MD as Consulting Physician (Neurology) There are no Patient Instructions on file for this visit.  Neta MendsWanda K. Morgan Rennert M.D.

## 2016-12-30 ENCOUNTER — Encounter: Payer: 59 | Admitting: Internal Medicine

## 2017-01-09 ENCOUNTER — Telehealth: Payer: Self-pay | Admitting: *Deleted

## 2017-01-09 NOTE — Telephone Encounter (Signed)
Patient Name: Toni BreathRHONDA BRODER-DUN [name] Gender: Female DOB: 05/26/1955 Age: 62 Y 3 M 4 D Return Phone Number: [phone] (Primary), [phone] (Secondary) Address: [place]: [place] [id number] Client [facility] Night - Client Client Site [facility] - Night Physician Berniece AndreasPanosh, Wanda - MD Contact Type Call Who Is Calling Patient / Member / Family / Caregiver Call Type Triage / Clinical Relationship To Patient Self Return Phone Number [phone] (Primary) Chief Complaint WHEEZING Reason for Call Symptomatic / Request for Health Information Initial Comment Caller states she has a bad cold and a cough for two weeks. She is having chest is hurting and she is wheezing. Translation No Nurse Assessment Nurse: Mayford KnifeWilliams, RN, Whitney Date/Time (Eastern Time): 01/07/2017 12:54:00 PM Confirm and document reason for call. If symptomatic, describe symptoms. ---Caller states she has a bad cold and a cough for two weeks. Her chest is hurting and she is wheezing. denies fever. Does the patient have any new or worsening symptoms? ---Yes Will a triage be completed? ---Yes Related visit to physician within the last 2 weeks? ---No Does the PT have any chronic conditions? (i.e. diabetes, asthma, etc.) ---Yes List chronic conditions. ---depression Is this a behavioral health or substance abuse call? ---No Guidelines Guideline Title Affirmed Question Affirmed Notes Nurse Date/Time (Eastern Time) Cough - Acute Productive Chest pain (Exception: MILD central chest pain, present only when coughing) Mayford KnifeWilliams, RN, Whitney 01/07/2017 12:56:15 PM Disp. Time Lamount Cohen(Eastern Time) Disposition Final User 01/07/2017 12:48:08 PM Send to Urgent Queue Doristine MangoBennett, Jennifer 01/07/2017 12:58:41 PM Go to ED Now Yes Mayford KnifeWilliams, RN, Alphonzo LemmingsWhitney

## 2017-01-09 NOTE — Telephone Encounter (Signed)
[facility] over the [facility].

## 2017-01-11 ENCOUNTER — Telehealth: Payer: Self-pay | Admitting: Internal Medicine

## 2017-01-11 NOTE — Telephone Encounter (Addendum)
°  Relation to pt: self  Call back number:[phone]  Reason for call:  Patient in need of clinical advice regarding runny nose and what of OTC medication she can take, please advise

## 2017-01-11 NOTE — Telephone Encounter (Signed)
Please advise Dr Panosh, thanks.   

## 2017-01-11 NOTE — Telephone Encounter (Signed)
I returned her call regarding continued runny nose and non-productive coughing.   She was seen [facility] on Sat due to wheezing and tightness in her chest after cleaning her garage and opening old boxes on Friday.    She was sick with cold symptoms prior to opening the old boxes on Friday.  Saturday morning is when she woke up unable to talk, wheezing and feeling tight in her chest. They gave her Amoxicillin and Promethazine cough medicine along with a ProAir inhaler.   She has not used the inhaler today.  She is concerned that she is still having a runny nose and tight non-productive cough.     I encouraged her to continue taking the Amoxicillin and to try taking the Zyrtec she has on hand to see if it would help with her symptoms since she got worse after getting better after cleaning and opening old boxes.  I also encouraged her to use her inhaler every 4-6 hours since she hasn't used it today and was sounding tight with her coughing as she was talking with me.    I instructed her to call us back in a couple of days if she did not get any better after using the Zyrtec and inhaler and Amoxicillin.    She verbalized understanding. I routed a note to Dr. Fabian SharpPanosh

## 2017-01-11 NOTE — Telephone Encounter (Signed)
I agree   Reviewed    note

## 2017-01-27 ENCOUNTER — Ambulatory Visit: Payer: 59 | Admitting: Neurology

## 2017-02-10 ENCOUNTER — Ambulatory Visit: Payer: Self-pay | Admitting: *Deleted

## 2017-02-10 ENCOUNTER — Telehealth: Payer: Self-pay | Admitting: Internal Medicine

## 2017-02-10 NOTE — Telephone Encounter (Signed)
Copied from CRM [phone]. Topic: Quick Communication - See Telephone Encounter >> Feb 10, 2017 10:16 AM Arlyss Gandyichardson, Griffen Frayne N, NT wrote: CRM for notification. See Telephone encounter for: Pt calling and states her grandson that lives with her was diagnosed with the flu a few days ago and she is now having a fever, can't keep food or drink down, body aches, and just in general feels bad. She states is unable to come in for an appt due to no transportation and no energy to drive herself. Uses [facility] on [address]   02/10/17.

## 2017-02-10 NOTE — Telephone Encounter (Signed)
Pt  Has   Symptoms  Of  Weakness    Multiple   Stools   That   Is  Not  Diarrhea      Vague  abd  Pain     No fever  Check   Of forehead   And  It  Was   97.6     Pt  Denies  Any  Vomiting  . Grandson  Had  Flu  6  Days  Ago but  Is  Doing better.  Pt  Has multiple  Complaints   And  Is  Vague  With  Her symptoms. Attempted  To  Make  Appointment with  Patient  With a  [place]  Provider   She  Declined . She  Stated  She  Would  Try  Liquids   And would  Call back  If  She  Changed  Her mind  Or  Got  Worse .  She  Was  Advised  To go to  [facility] if  Worse.   Reason for Disposition . [1] MODERATE weakness (i.e., interferes with work, school, normal activities) AND [2] persists > 3 days  Answer Assessment - Initial Assessment Questions 1. DESCRIPTION: "Describe how you are feeling."       Weak    Had   6   Stools   Yesterday  abd   Pain  As   Well    2. SEVERITY: "How bad is it?"  "Can you stand and walk?"   - MILD - Feels weak or tired, but does not interfere with work, school or normal activities   - MODERATE - Able to stand and walk; weakness interferes with work, school, or normal activities   - SEVERE - Unable to stand or walk       Moderate   3. ONSET:  "When did the weakness begin?"        Yesterday  4. CAUSE: "What do you think is causing the weakness?"         Grandson  Had  Flu   Was   Seen   6  Days   Ago   But is  Better  Now   5. MEDICINES: "Have you recently started a new medicine or had a change in the amount of a medicine?"           No   6. OTHER SYMPTOMS: "Do you have any other symptoms?" (e.g., chest pain, fever, cough, SOB, vomiting, diarrhea, bleeding)     Loose  Stools    7. PREGNANCY: "Is there any chance you are pregnant?" "When was your last menstrual period?"         N/a  Protocols used: WEAKNESS (GENERALIZED) AND FATIGUE-A-AH

## 2017-02-10 NOTE — Telephone Encounter (Signed)
ENCOUNTER  CHANGED  TO  TRIAGE  ENCOUNTER

## 2017-02-21 ENCOUNTER — Other Ambulatory Visit: Payer: Self-pay

## 2017-02-21 ENCOUNTER — Encounter: Payer: Self-pay | Admitting: Neurology

## 2017-02-21 ENCOUNTER — Ambulatory Visit (INDEPENDENT_AMBULATORY_CARE_PROVIDER_SITE_OTHER): Payer: 59 | Admitting: Neurology

## 2017-02-21 VITALS — BP 136/72 | HR 108 | Ht 62.0 in | Wt 206.0 lb

## 2017-02-21 DIAGNOSIS — F411 Generalized anxiety disorder: Secondary | ICD-10-CM

## 2017-02-21 DIAGNOSIS — F321 Major depressive disorder, single episode, moderate: Secondary | ICD-10-CM

## 2017-02-21 DIAGNOSIS — IMO0002 Reserved for concepts with insufficient information to code with codable children: Secondary | ICD-10-CM

## 2017-02-21 DIAGNOSIS — G43709 Chronic migraine without aura, not intractable, without status migrainosus: Secondary | ICD-10-CM

## 2017-02-21 MED ORDER — SUMATRIPTAN SUCCINATE 25 MG PO TABS: 25.0000 mg | ORAL_TABLET | ORAL | 11 refills | Status: DC | PRN

## 2017-02-21 NOTE — Progress Notes (Signed)
NEUROLOGY FOLLOW UP OFFICE NOTE  Toni [name] [id number]  DOB: May 30, 1955  HISTORY OF PRESENT ILLNESS: Toni [name] was seen in follow-up in the neurology clinic on 02/21/2017.  The patient was last seen 4 months ago for migraines. On her last visit, she was very emotional, verbose and difficult to redirect, concerned about pain and tenderness in her skull. She had a head CT done which she was very concerned about due to report of "enlarged anterior extra-axial CSF spaces could be secondary to prominent frontal atrophy versus chronic subdural effusions, focal hypodensity in the left frontal lobe white matter, suspected to represent chronic infarct, but new since 2014." We did an MRI brain with and without contrast to further evaluate these changes reported, I personally reviewed imaging which showed moderate generalized atrophy accounting for the prominence of CSF spaces, no acute infarct or hemorrhage, no focal lesion, no significant white matter disease seen. Prominent extra-axial CSF spaces are present without a discrete collection, no abnormal enhancement. She presents today stating she is in a better place. She continues to be tremulous and emotional. She is difficult to redirect and relates her difficulties with family issues, now with custody of her 76 year old grandson Toni [name], living in a townhouse with her ex-husband and grandson. She states "I'm still really smart, my memory was perfect" when she was able to close on the townhouse and do all the paperwork. She states she has made alterations in her life and that she is doing good and knows she needs to make changes in her life, "I'm thrilled with the amount of intelligence I have." She is excited because she was reading the terms and conditions of her townhome and could see "this is not right" and has an appointment with the manager of the complex. She states her problem is "my starter," she cannot get her starter in the  morning, taking her medications in the morning and going back to sleep, then waking up later on. Her schedule is irregular, she does not know when to stop. She then needs a day or two to rest. She "cannot stop finding that 7-letter word, spending 2 hours looking for it." She denies any further falls. She reporets her skull was fine for a while, but recently got worse. It is bad today because she did not take her gabapentin. She states her Pain specialist increased the dose. She continues to see her psychiatrist Dr. Evelene [name] regularly.   HPI: This is a 62 yo LH woman with a history of migraines, treatment-resistant depression, anxiety, hepatitis C, who had initially presented with a presumed diagnosis of frontotemporal dementia. She had previously been seeing neurologist Toni [name], then she saw Toni [name] at [facility] one time, followed by 2 visits with Toni [name] at [facility]. I will summarize her history for our records. She reports she was high-performance, doing multiple things at the same time, until 2011 when she started having more difficulties at work. She had been unable to do things she used to do. She started having problems sleeping. She denied any problems with her memory. She had lack of stamina, word-finding difficulties, and had been going through stress with her son at that time. She had 2 brain MRI studies done (05/08/09 and 02/24/12) which I personally reviewed, showing mild atrophy in the bilateral frontal lobes. She had a PET scan in 2011 reported as normal. She underwent Neuropsychological evaluation with Dr. Leonides [name] in 2011 which did not identify areas of cognitive impairment. Her current  level of emotional distress seems far higher than her reported longtime baseline of what sounds like mild depression and generalized anxiety. Recommendation was for continued psychological counseling and psychiatric services.  On review of Toni [name]'s notes, her MOCA score was normal 28/30. He stated that she  does not appear to the the disinhibition, apathy, or language disorder on exam which would be typical of frontotemporal dementia. He did a PET scan which showed decreased metabolic activity in regions of atrophy in the anterior frontal lobes, which confounds comparison of metabolism patterns with normal age-matched individuals. No pattern of hypometabolic changes to suggest a specific type of dementia on this exam. On her last visit with him in 10/2013, his assessment was Presumed frontotemporal dementia, serial imaging demonstrated frontal atrophy bilaterally. PET was inconclusive due to pre-existing frontal atrophy. MOCA score stable at 28/30. He suggested repeating Neuropsychological testing. They discussed that SSRIs may be helpful for behavioral abnormalities. She had been taking Fetzima at that time, and was switched to Pristiq because she was reporting different side effects. She has been seeing Dr. Evelene [name] for many years and reports trying different medications. Dr. Evelene [name] had noted that she has cognitive impairment caused by frontotemporal dementia and depression. Depression has been treatment-resistant. She has extreme profound inability to sustain attention, process information, modulate information. Toni [name] reports that for the past few months, she had difficulty getting out of bed. She was tearful in the office today, stating she is "totally afraid to make commitments" and says "I have no purpose in life." She went from no desire to do anything, to extreme anxiety of having to do something. She asks about getting home therapy to help get her out of bed and go down to eat, otherwise she would stay in bed. Her fear is that she is getting worse and her children will see that this is how she is. She just wants to "give myself the best chance of getting my life back." She feels Pristiq does not seem to be working. She tried stimulants in the past but could not take them. She reports erratic sleep, she would  be sleep-deprived and get her days and nights confused, other times she would sleep for 2 straight days. She was started on a new medication one time and became so confused with hallucinations, she went to the wrong dentist office.   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 Results show that despite evidence she has mild bifrontal lobe atrophy on imaging, it was felt that she does NOT have frontotemporal dementia based on 2 evaluations that do not fit the typical pattern of frontotemporal dementia despite exhibiting consistent difficulty with complex planning/organizing. Hypotheses include that without serial imaging before 2011,"it is not possible if these changes coincided with her significant cognitive and behavioral changes or are long-standing. However, based on the probability of having "frontal symptoms" due to this finding, it is likely that the structural brain changes are related, to some extent, to her having significant change in functioning. After review of history, there are a few possibilities. She was diagnosed with hepatitis C for many decades and has not undergone treatment, is it possible that she has minimal hepatic encephalopathy? This could also be related to the patient's complaint of extreme fatigue and treatment resistant depression. Based on a very high level of personal stress in 2011, did the patient have  a "nervous breakdown" and her decompensation has been related solely to psychiatric stress specifically depression? There is literature to suggest atrophy in frontal brain areas secondary to depression. The diagnosis of FTD is unlikely." Recommendations included ongoing psychiatric evaluation/treatment, CBT, treatment for hepatitis C.  She has a history of migraines occurring 5  days a week, with pain behind her eyes or radiating from her neck, with uncomfortable pressure behind her eyes. Relpax helps if she takes it at the onset. If she wakes up with a migraine, she uses sumatriptan injections. She has chronic neck and back pain.    PAST MEDICAL HISTORY: Past Medical History:  Diagnosis Date  . Acute hepatitis C without mention of hepatic coma(070.51)    No treatment  . Brachial neuritis or radiculitis NOS   . Depressive disorder, not elsewhere classified   . Essential and other specified forms of tremor   . Headache(784.0)   . Memory loss   . Microscopic hematuria    seed dr Patsi Sears in past  . Migraine, unspecified, without mention of intractable migraine without mention of status migrainosus   . Other specified disorders of rotator cuff syndrome of shoulder and allied disorders   . Vulvar pain    rx with valium supp    MEDICATIONS:  Outpatient Encounter Medications as of 02/21/2017  Medication Sig Note  . albuterol (PROVENTIL HFA;VENTOLIN HFA) 108 (90 Base) MCG/ACT inhaler Inhale 2 puffs into the lungs every 6 (six) hours as needed for wheezing or shortness of breath.   . ALPRAZolam (XANAX) 1 MG tablet Take 1 mg by mouth 4 (four) times daily as needed for anxiety (shakes).   . benzonatate (TESSALON) 100 MG capsule Take 1 capsule (100 mg total) by mouth 2 (two) times daily as needed for cough.   . cetirizine (ZYRTEC) 10 MG tablet Take 10 mg by mouth daily as needed for allergies.   . cyclobenzaprine (FLEXERIL) 10 MG tablet Take 1 tablet (10 mg total) by mouth 2 (two) times daily as needed. Muscle spasms/headaches   . desvenlafaxine (PRISTIQ) 50 MG 24 hr tablet Take 50 mg by mouth daily.   Marland Kitchen dextroamphetamine (DEXEDRINE SPANSULE) 15 MG 24 hr capsule Take 15 mg by mouth daily.   Marland Kitchen eletriptan (RELPAX) 40 MG tablet Take 1 tablet (40 mg total) by mouth every 2 (two) hours as needed for migraine. Take 1 tablet at onset of migraine. May take another dose after 2  hours.   Marland Kitchen FLUoxetine (PROZAC) 20 MG capsule Take 1 capsule by mouth daily. 10/14/2014: Received from: External Pharmacy Received Sig: TAKE ONE CAPSULE BY MOUTH EVERY DAY FOR 2 WEEKS. THEN 2 CAPSULES DAILY  . gabapentin (NEURONTIN) 100 MG capsule Take 300 mg by mouth 3 (three) times daily as needed.  12/30/2015: Received from: External Pharmacy  . HARVONI 90-400 MG TABS Take 1 tablet by mouth daily for 8 weeks   . HYDROcodone-ibuprofen (VICOPROFEN) 7.5-200 MG per tablet Take 0.5-1 tablets by mouth 4 (four) times daily as needed for moderate pain or severe pain.    Marland Kitchen ketoconazole (NIZORAL) 2 % cream APPLY 1 APPLICATION TOPICALLY 2 (TWO) TIMES DAILY.   Marland Kitchen Misc Natural Products (ESTROVEN + ENERGY MAX STRENGTH) TABS Take by mouth daily.   . Multiple Vitamins-Minerals (CENTRUM SILVER ADULT 50+) TABS Take 1 tablet by mouth daily.   . SUMAtriptan (IMITREX) 25 MG tablet Take 1 tablet (25 mg total) by mouth as needed for migraine. May repeat in 2 hours if headache persists or recurs.  No facility-administered encounter medications on file as of 02/21/2017.      ALLERGIES: Allergies  Allergen Reactions  . Codeine Nausea And Vomiting and Other (See Comments)    Shuts down GI tract    FAMILY HISTORY: Family History  Problem Relation Age of Onset  . Headache Mother   . Stroke Father        father passed away june17  . Diabetes Maternal Grandmother   . Other Son        Mother is deceased May 21, 2011 father moved to the area 05-20-12.    SOCIAL HISTORY: Social History   Socioeconomic History  . Marital status: Divorced    Spouse name: Not on file  . Number of children: 3  . Years of education: Not on file  . Highest education level: Not on file  Social Needs  . Financial resource strain: Not on file  . Food insecurity - worry: Not on file  . Food insecurity - inability: Not on file  . Transportation needs - medical: Not on file  . Transportation needs - non-medical: Not on file  Occupational  History  . Not on file  Tobacco Use  . Smoking status: Former Smoker    Types: Cigarettes    Last attempt to quit: 05/23/1978    Years since quitting: 38.7  . Smokeless tobacco: Never Used  Substance and Sexual Activity  . Alcohol use: No    Alcohol/week: 0.0 oz  . Drug use: No  . Sexual activity: Not on file  Other Topics Concern  . 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 On disability for depression and short-term memory problems. Dr. love.   She is gravida 3 para 3   Father passed away  Jun 29, 2022   REVIEW OF SYSTEMS: Constitutional: No fevers, chills, or sweats, generalized fatigue, change in appetite Eyes: No visual changes, double vision, eye pain Ear, nose and throat: No hearing loss, ear pain, nasal congestion, sore throat Cardiovascular: No chest pain, palpitations Respiratory:  No shortness of breath at rest or with exertion, wheezes GastrointestinaI: No nausea, vomiting, diarrhea, abdominal pain, fecal incontinence Genitourinary:  No dysuria, urinary retention or frequency Musculoskeletal:  + neck pain, back pain Integumentary: No rash, pruritus, skin lesions Neurological: as above Psychiatric: + depression, insomnia, anxiety Endocrine: No palpitations, fatigue, diaphoresis, mood swings, change in appetite, change in weight, increased thirst Hematologic/Lymphatic:  No anemia, purpura, petechiae. Allergic/Immunologic: no itchy/runny eyes, nasal congestion, recent allergic reactions, rashes  PHYSICAL EXAM: Vitals:   02/21/17 1328  BP: 136/72  Pulse: (!) 108  SpO2: 94%   General: tremulous, emotional, very verbose, difficult to redirect Head:  Normocephalic/atraumatic Neck: supple, + paraspinal tenderness, full range of motion Heart:  Regular rate and rhythm Lungs:  Clear to auscultation bilaterally Back: No paraspinal  tenderness Skin/Extremities: No rash, no edema Neurological Exam: alert and oriented to person, place, and time. No aphasia or dysarthria. Fund of knowledge is appropriate.  Recent and remote memory are intact.  Attention and concentration are normal.    Able to name objects and repeat phrases. Cranial nerves: Pupils equal, round. Extraocular movements intact. No facial asymmetry. Motor: Moves all extremities symmetrically.  Gait narrow-based and steady  IMPRESSION: This is a 62 yo LH woman with a history of migraines, treatment-resistant depression, hepatitis C, who had initially  presented for a diagnosis of presumed frontotemporal dementia. She has bilateral frontal atrophy on MRI, however PET scans have been inconclusive due to pre-existing atrophy. She underwent repeat Neuropsychological evaluation which did not indicate any evidence for frontotemporal dementia. It appears she has finally accepted that there is no evidence of FTD. She presents today reporting she is in a better place, she continues to be very verbose and difficult to redirect about her struggles with family issues. At the end of the visit, she states she called her insurance company and was told there are "no limits" to the Imitrex she can get. I discussed with her that we can only prescribe 10 a month, otherwise patients would be more prone to medication overuse headaches. I discussed with her that she can seek a second opinion from a different neurologist if she wishes. She is also asking for a referral to a Neuro-ophthalmologist because her eyes and brain feel tired, I discussed seeing an ophthalmologist first, and if they recommend Neuro-ophtho, we can send referral. She agrees to continuing 10 Imitrex a month and will follow-up in 1 year.  Thank you for allowing me to participate in her care.  Please do not hesitate to call for any questions or concerns.  The duration of this appointment visit was 25 minutes of face-to-face time  with the patient.  Greater than 50% of this time was spent in counseling, explanation of diagnosis, planning of further management, and coordination of care.   Patrcia DollyKaren Aquino, M.D.   CC: Dr. Fabian SharpPanosh, Dr. Evelene [name]

## 2017-02-21 NOTE — Patient Instructions (Addendum)
1. Continue all your current medications 2. See how you feel with reading glasses, and if any issues, we can see an ophthalmologist 3. Follow-up with all your other physicians as scheduled 4. Follow-up in 1 year, call for any changes

## 2017-02-27 NOTE — Progress Notes (Signed)
 Chief Complaint  Patient presents with  . Annual Exam    weight concerns,     HPI: Patient  Toni [name]  61 y.o. comes in today for Preventive Health Care visit   Saw  aquino for amigraine and a anxiety  Depression felt to be the memory issues  seh is trying to dec the amount of  benzos slowly .  She is "cured of hep c" Has less frequent  Weekly instead  .  Uses triptan Ask for refill of estrace uses as needed  Given in pat per prev clinician ?  Health Maintenance  Topic Date Due  . HIV Screening  10/03/1968  . TETANUS/TDAP  10/03/1972  . MAMMOGRAM  05/12/2018  . PAP SMEAR  12/30/2018  . COLONOSCOPY  03/29/2026  . INFLUENZA VACCINE  Completed  . Hepatitis C Screening  Completed   Health Maintenance Review LIFESTYLE:  Exercise:  Inconsistent.   Clean house   Tobacco/ETS: no  hh member  Outside.  Alcohol: no Sugar beverages:no Sleep:   Last 6 am - 11 or 2  Tries to go to bed a t 2 am  Drug use: no HH of 3 2 cats  Raising a 5 year old child  Not . Ptsd.  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     ROS:  GEN/ HEENT: No fever, svision problems hearing changes, CV/ PULM; No chest pain shortness of breath cough, syncope,edema  change in exercise tolerance. GI /GU: No adominal pain, vomiting, change in bowel habits. No blood in the stool. No significant GU symptoms. SKIN/HEME: ,no acute skin rashes suspicious lesions or bleeding. No lymphadenopathy, nodules, masses.  NEURO/ PSYCH:  No neurologic signs such as weakness numbness. Under rx fo anx depression IMM/ Allergy: No unusual infections.  Allergy .   REST of 12 system review negative except as per HPI   Past Medical History:  Diagnosis Date  . Acute hepatitis C without mention of hepatic coma(070.51)    No treatment  . Brachial neuritis or radiculitis NOS   . Depressive disorder, not elsewhere classified   . Essential and other specified forms of tremor   . Headache(784.0)   . Memory loss   .  Microscopic hematuria    seed dr tannenbaum in past  . Migraine, unspecified, without mention of intractable migraine without mention of status migrainosus   . Other specified disorders of rotator cuff syndrome of shoulder and allied disorders   . Vulvar pain    rx with valium supp    Past Surgical History:  Procedure Laterality Date  . BREAST EXCISIONAL BIOPSY Right    No scar  . CESAREAN SECTION     x3  . SHOULDER ARTHROSCOPY W/ ROTATOR CUFF REPAIR Right 2004  . SHOULDER ARTHROSCOPY W/ ROTATOR CUFF REPAIR Left 2008    Family History  Problem Relation Age of Onset  . Headache Mother   . Stroke Father        father passed away june17  . Diabetes Maternal Grandmother   . Other Son        Mother is deceased 2013 father moved to the area 2014.    Social History   Socioeconomic History  . Marital status: Divorced    Spouse name: None  . Number of children: 3  . Years of education: None  . Highest education level: None  Social Needs  . Financial resource strain: None  . Food insecurity - worry: None  . Food   insecurity - inability: None  . Transportation needs - medical: None  . Transportation needs - non-medical: None  Occupational History  . None  Tobacco Use  . Smoking status: Former Smoker    Types: Cigarettes    Last attempt to quit: 05/23/1978    Years since quitting: 38.7  . Smokeless tobacco: Never Used  Substance and Sexual Activity  . Alcohol use: No    Alcohol/week: 0.0 oz  . Drug use: No  . Sexual activity: None  Other Topics Concern  . 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 On disability for depression and short-term memory problems. Dr. love.   She is gravida 3 para 3   Father passed away  June 17    Outpatient Medications Prior to Visit  Medication Sig Dispense Refill  . albuterol (PROVENTIL HFA;VENTOLIN HFA) 108 (90  Base) MCG/ACT inhaler Inhale 2 puffs into the lungs every 6 (six) hours as needed for wheezing or shortness of breath. 1 Inhaler 0  . ALPRAZolam (XANAX) 1 MG tablet Take 1 mg by mouth 4 (four) times daily as needed for anxiety (shakes).    . benzonatate (TESSALON) 100 MG capsule Take 1 capsule (100 mg total) by mouth 2 (two) times daily as needed for cough. 20 capsule 1  . cetirizine (ZYRTEC) 10 MG tablet Take 10 mg by mouth daily as needed for allergies.    . cyclobenzaprine (FLEXERIL) 10 MG tablet Take 1 tablet (10 mg total) by mouth 2 (two) times daily as needed. Muscle spasms/headaches 40 tablet 0  . desvenlafaxine (PRISTIQ) 50 MG 24 hr tablet Take 50 mg by mouth daily.    . dextroamphetamine (DEXEDRINE SPANSULE) 15 MG 24 hr capsule Take 30-45 mg by mouth daily.     . FLUoxetine (PROZAC) 20 MG capsule Take 1 capsule by mouth daily.  0  . gabapentin (NEURONTIN) 600 MG tablet Take 600 mg by mouth 2 (two) times daily.   1  . HYDROcodone-ibuprofen (VICOPROFEN) 7.5-200 MG per tablet Take 0.5-1 tablets by mouth 4 (four) times daily as needed for moderate pain or severe pain.     . ketoconazole (NIZORAL) 2 % cream APPLY 1 APPLICATION TOPICALLY 2 (TWO) TIMES DAILY. 30 g 0  . Misc Natural Products (ESTROVEN + ENERGY MAX STRENGTH) TABS Take by mouth daily.    . Multiple Vitamins-Minerals (CENTRUM SILVER ADULT 50+) TABS Take 1 tablet by mouth daily.    . SUMAtriptan (IMITREX) 25 MG tablet Take 1 tablet (25 mg total) by mouth as needed for migraine. May repeat in 2 hours if headache persists or recurs. 10 tablet 11  . estradiol (ESTRACE) 0.1 MG/GM vaginal cream Place 1 Applicatorful vaginally daily as needed.    . eletriptan (RELPAX) 40 MG tablet Take 1 tablet (40 mg total) by mouth every 2 (two) hours as needed for migraine. Take 1 tablet at onset of migraine. May take another dose after 2 hours. (Patient not taking: Reported on 02/28/2017) 20 tablet 11  . gabapentin (NEURONTIN) 100 MG capsule Take 300 mg by  mouth 3 (three) times daily as needed.     . HARVONI 90-400 MG TABS Take 1 tablet by mouth daily for 8 weeks     No facility-administered medications prior to visit.      EXAM:  BP 136/82 (BP Location: Right Arm, Patient Position: Sitting,   Cuff Size: Normal)   Pulse 89   Ht 5' 2" (1.575 m)   Wt 204 lb 12.8 oz (92.9 kg)   BMI 37.46 kg/m   Body mass index is 37.46 kg/m. Wt Readings from Last 3 Encounters:  02/28/17 204 lb 12.8 oz (92.9 kg)  02/21/17 206 lb (93.4 kg)  10/21/16 195 lb (88.5 kg)    Physical Exam: Vital signs reviewed   GEN:This is a well-developed well-nourished alert cooperative    who appearsr stated age in no acute distress.  HEENT: normocephalic atraumatic , Eyes: PERRL EOM's full, conjunctiva clear, Nares: paten,t no deformity discharge or tenderness., Ears: no deformity EAC's clear TMs with normal landmarks. Mouth: clear OP, no lesions, edema.  Moist mucous membranes. Dentition in adequate repair. NECK: supple without masses, thyromegaly or bruits. CHEST/PULM:  Clear to auscultation and percussion breath sounds equal no wheeze , rales or rhonchi. No chest wall deformities or tenderness. Breast: normal by inspection . No dimpling, discharge, masses, tenderness or discharge . CV: PMI is nondisplaced, S1 S2 no gallops, murmurs, rubs. Peripheral pulses are full without delay.No JVD .  ABDOMEN: Bowel sounds normal nontender  No guard or rebound, no hepato splenomegal no CVA tenderness.  No hernia. Extremtities:  No clubbing cyanosis or edema, no acute joint swelling or redness no focal atrophy NEURO:  Oriented x3, cranial nerves 3-12 appear to be intact, no obvious focal weakness,gait within normal limits no abnormal reflexes or asymmetrical SKIN: No acute rashes normal turgor, color, no bruising or petechiae. Bruise left knee ( rna into bed) facial erythema no rash  PSYCH: Oriented, good eye contact, speaks haltingly and at times  All over the place in conversation  but   Can get back on track LN: no cervical axillary inguinal adenopathy    BP Readings from Last 3 Encounters:  02/28/17 136/82  02/21/17 136/72  10/21/16 130/68    Lab results reviewed with patient   ASSESSMENT AND PLAN:  Discussed the following assessment and plan:  Visit for preventive health examination - Plan: Basic metabolic panel, CBC with Differential/Platelet, Hepatic function panel, Lipid panel, TSH, T4, free, Hemoglobin A1c  Medication management - Plan: Basic metabolic panel, CBC with Differential/Platelet, Hepatic function panel, Lipid panel, TSH, T4, free, Hemoglobin A1c  Mild intermittent asthmatic bronchitis without complication  Hyperlipidemia, unspecified hyperlipidemia type - Plan: Basic metabolic panel, CBC with Differential/Platelet, Hepatic function panel, Lipid panel, TSH, T4, free, Hemoglobin A1c  Subclinical hypothyroidism - Plan: Basic metabolic panel, CBC with Differential/Platelet, Hepatic function panel, Lipid panel, TSH, T4, free, Hemoglobin A1c  Major depressive disorder, remission status unspecified, unspecified whether recurrent - Plan: Basic metabolic panel, CBC with Differential/Platelet, Hepatic function panel, Lipid panel, TSH, T4, free, Hemoglobin A1c Blood work today n patient want a copy or results .  Then plan rov 6-12 months for now  Have dr aquino refill the   triptan    And reassess  Later if controlled then  We may be able to refill at some point she was   Using 20 per mont? According to notes.  Pt aware of rebound risk    Ok to  rx  estrace cream that pat had been using prn.  Patient Care Team: ,  K, MD as PCP - General (Internal Medicine) Nudelman, Robert, MD as Consulting Physician (Neurosurgery) Tannenbaum, Sigmund, MD as Consulting Physician (Urology) Kaur, Rupinder, MD as Consulting Physician (Psychiatry) Aquino, Karen M, MD as Consulting Physician (Neurology) Patient Instructions  Will notify you  of labs   when  available.   Get some exercise    Music .  Avoid  Lots of screen time .Marland Kitchen  Sleep cycling  See  An ophthalmologist  And if needed can do   More evaluation if needed.     Health Maintenance, Female Adopting a healthy lifestyle and getting preventive care can go a long way to promote health and wellness. Talk with your health care provider about what schedule of regular examinations is right for you. This is a good chance for you to check in with your provider about disease prevention and staying healthy. In between checkups, there are plenty of things you can do on your own. Experts have done a lot of research about which lifestyle changes and preventive measures are most likely to keep you healthy. Ask your health care provider for more information. Weight and diet Eat a healthy diet  Be sure to include plenty of vegetables, fruits, low-fat dairy products, and lean protein.  Do not eat a lot of foods high in solid fats, added sugars, or salt.  Get regular exercise. This is one of the most important things you can do for your health. ? Most adults should exercise for at least 150 minutes each week. The exercise should increase your heart rate and make you sweat (moderate-intensity exercise). ? Most adults should also do strengthening exercises at least twice a week. This is in addition to the moderate-intensity exercise.  Maintain a healthy weight  Body mass index (BMI) is a measurement that can be used to identify possible weight problems. It estimates body fat based on height and weight. Your health care provider can help determine your BMI and help you achieve or maintain a healthy weight.  For females 65 years of age and older: ? A BMI below 18.5 is considered underweight. ? A BMI of 18.5 to 24.9 is normal. ? A BMI of 25 to 29.9 is considered overweight. ? A BMI of 30 and above is considered obese.  Watch levels of cholesterol and blood lipids  You should start having your  blood tested for lipids and cholesterol at 64 years of age, then have this test every 5 years.  You may need to have your cholesterol levels checked more often if: ? Your lipid or cholesterol levels are high. ? You are older than 65 years of age. ? You are at high risk for heart disease.  Cancer screening Lung Cancer  Lung cancer screening is recommended for adults 62-49 years old who are at high risk for lung cancer because of a history of smoking.  A yearly low-dose CT scan of the lungs is recommended for people who: ? Currently smoke. ? Have quit within the past 15 years. ? Have at least a 30-pack-year history of smoking. A pack year is smoking an average of one pack of cigarettes a day for 1 year.  Yearly screening should continue until it has been 15 years since you quit.  Yearly screening should stop if you develop a health problem that would prevent you from having lung cancer treatment.  Breast Cancer  Practice breast self-awareness. This means understanding how your breasts normally appear and feel.  It also means doing regular breast self-exams. Let your health care provider know about any changes, no matter how small.  If you are in your 20s or 30s, you should have a clinical breast exam (CBE) by a health care provider every 1-3 years as part of a regular health exam.  If  you are 40 or older, have a CBE every year. Also consider having a breast X-ray (mammogram) every year.  If you have a family history of breast cancer, talk to your health care provider about genetic screening.  If you are at high risk for breast cancer, talk to your health care provider about having an MRI and a mammogram every year.  Breast cancer gene (BRCA) assessment is recommended for women who have family members with BRCA-related cancers. BRCA-related cancers include: ? Breast. ? Ovarian. ? Tubal. ? Peritoneal cancers.  Results of the assessment will determine the need for genetic  counseling and BRCA1 and BRCA2 testing.  Cervical Cancer Your health care provider may recommend that you be screened regularly for cancer of the pelvic organs (ovaries, uterus, and vagina). This screening involves a pelvic examination, including checking for microscopic changes to the surface of your cervix (Pap test). You may be encouraged to have this screening done every 3 years, beginning at age 21.  For women ages 30-65, health care providers may recommend pelvic exams and Pap testing every 3 years, or they may recommend the Pap and pelvic exam, combined with testing for human papilloma virus (HPV), every 5 years. Some types of HPV increase your risk of cervical cancer. Testing for HPV may also be done on women of any age with unclear Pap test results.  Other health care providers may not recommend any screening for nonpregnant women who are considered low risk for pelvic cancer and who do not have symptoms. Ask your health care provider if a screening pelvic exam is right for you.  If you have had past treatment for cervical cancer or a condition that could lead to cancer, you need Pap tests and screening for cancer for at least 20 years after your treatment. If Pap tests have been discontinued, your risk factors (such as having a new sexual partner) need to be reassessed to determine if screening should resume. Some women have medical problems that increase the chance of getting cervical cancer. In these cases, your health care provider may recommend more frequent screening and Pap tests.  Colorectal Cancer  This type of cancer can be detected and often prevented.  Routine colorectal cancer screening usually begins at 64 years of age and continues through 64 years of age.  Your health care provider may recommend screening at an earlier age if you have risk factors for colon cancer.  Your health care provider may also recommend using home test kits to check for hidden blood in the  stool.  A small camera at the end of a tube can be used to examine your colon directly (sigmoidoscopy or colonoscopy). This is done to check for the earliest forms of colorectal cancer.  Routine screening usually begins at age 50.  Direct examination of the colon should be repeated every 5-10 years through 64 years of age. However, you may need to be screened more often if early forms of precancerous polyps or small growths are found.  Skin Cancer  Check your skin from head to toe regularly.  Tell your health care provider about any new moles or changes in moles, especially if there is a change in a mole's shape or color.  Also tell your health care provider if you have a mole that is larger than the size of a pencil eraser.  Always use sunscreen. Apply sunscreen liberally and repeatedly throughout the day.  Protect yourself by wearing long sleeves, pants, a wide-brimmed hat, and sunglasses   whenever you are outside.  Heart disease, diabetes, and high blood pressure  High blood pressure causes heart disease and increases the risk of stroke. High blood pressure is more likely to develop in: ? People who have blood pressure in the high end of the normal range (130-139/85-89 mm Hg). ? People who are overweight or obese. ? People who are African American.  If you are 18-39 years of age, have your blood pressure checked every 3-5 years. If you are 40 years of age or older, have your blood pressure checked every year. You should have your blood pressure measured twice-once when you are at a hospital or clinic, and once when you are not at a hospital or clinic. Record the average of the two measurements. To check your blood pressure when you are not at a hospital or clinic, you can use: ? An automated blood pressure machine at a pharmacy. ? A home blood pressure monitor.  If you are between 55 years and 79 years old, ask your health care provider if you should take aspirin to prevent  strokes.  Have regular diabetes screenings. This involves taking a blood sample to check your fasting blood sugar level. ? If you are at a normal weight and have a low risk for diabetes, have this test once every three years after 64 years of age. ? If you are overweight and have a high risk for diabetes, consider being tested at a younger age or more often. Preventing infection Hepatitis B  If you have a higher risk for hepatitis B, you should be screened for this virus. You are considered at high risk for hepatitis B if: ? You were born in a country where hepatitis B is common. Ask your health care provider which countries are considered high risk. ? Your parents were born in a high-risk country, and you have not been immunized against hepatitis B (hepatitis B vaccine). ? You have HIV or AIDS. ? You use needles to inject street drugs. ? You live with someone who has hepatitis B. ? You have had sex with someone who has hepatitis B. ? You get hemodialysis treatment. ? You take certain medicines for conditions, including cancer, organ transplantation, and autoimmune conditions.  Hepatitis C  Blood testing is recommended for: ? Everyone born from 1945 through 1965. ? Anyone with known risk factors for hepatitis C.  Sexually transmitted infections (STIs)  You should be screened for sexually transmitted infections (STIs) including gonorrhea and chlamydia if: ? You are sexually active and are younger than 64 years of age. ? You are older than 64 years of age and your health care provider tells you that you are at risk for this type of infection. ? Your sexual activity has changed since you were last screened and you are at an increased risk for chlamydia or gonorrhea. Ask your health care provider if you are at risk.  If you do not have HIV, but are at risk, it may be recommended that you take a prescription medicine daily to prevent HIV infection. This is called pre-exposure prophylaxis  (PrEP). You are considered at risk if: ? You are sexually active and do not regularly use condoms or know the HIV status of your partner(s). ? You take drugs by injection. ? You are sexually active with a partner who has HIV.  Talk with your health care provider about whether you are at high risk of being infected with HIV. If you choose to begin PrEP, you should   first be tested for HIV. You should then be tested every 3 months for as long as you are taking PrEP. Pregnancy  If you are premenopausal and you may become pregnant, ask your health care provider about preconception counseling.  If you may become pregnant, take 400 to 800 micrograms (mcg) of folic acid every day.  If you want to prevent pregnancy, talk to your health care provider about birth control (contraception). Osteoporosis and menopause  Osteoporosis is a disease in which the bones lose minerals and strength with aging. This can result in serious bone fractures. Your risk for osteoporosis can be identified using a bone density scan.  If you are 65 years of age or older, or if you are at risk for osteoporosis and fractures, ask your health care provider if you should be screened.  Ask your health care provider whether you should take a calcium or vitamin D supplement to lower your risk for osteoporosis.  Menopause may have certain physical symptoms and risks.  Hormone replacement therapy may reduce some of these symptoms and risks. Talk to your health care provider about whether hormone replacement therapy is right for you. Follow these instructions at home:  Schedule regular health, dental, and eye exams.  Stay current with your immunizations.  Do not use any tobacco products including cigarettes, chewing tobacco, or electronic cigarettes.  If you are pregnant, do not drink alcohol.  If you are breastfeeding, limit how much and how often you drink alcohol.  Limit alcohol intake to no more than 1 drink per day for  nonpregnant women. One drink equals 12 ounces of beer, 5 ounces of wine, or 1 ounces of hard liquor.  Do not use street drugs.  Do not share needles.  Ask your health care provider for help if you need support or information about quitting drugs.  Tell your health care provider if you often feel depressed.  Tell your health care provider if you have ever been abused or do not feel safe at home. This information is not intended to replace advice given to you by your health care provider. Make sure you discuss any questions you have with your health care provider. Document Released: 07/05/2010 Document Revised: 05/28/2015 Document Reviewed: 09/23/2014 Elsevier Interactive Patient Education  2018 [place]. Leimomi Zervas M.D.  Lab Results  Component Value Date   WBC 4.9 02/28/2017   HGB 13.5 02/28/2017   HCT 41.3 02/28/2017   PLT 229.0 02/28/2017   GLUCOSE 90 02/28/2017   CHOL 213 (H) 02/28/2017   TRIG 113.0 02/28/2017   HDL 46.90 02/28/2017   LDLCALC 144 (H) 02/28/2017   ALT 16 02/28/2017   AST 21 02/28/2017   NA 136 02/28/2017   K 4.4 02/28/2017   CL 98 02/28/2017   CREATININE 0.87 02/28/2017   BUN 26 (H) 02/28/2017   CO2 32 02/28/2017   TSH 5.92 (H) 02/28/2017   HGBA1C 5.8 02/28/2017   Plan repeat tsh ft4 and antibodies see result note

## 2017-02-28 ENCOUNTER — Encounter: Payer: Self-pay | Admitting: Internal Medicine

## 2017-02-28 ENCOUNTER — Ambulatory Visit (INDEPENDENT_AMBULATORY_CARE_PROVIDER_SITE_OTHER): Payer: 59 | Admitting: Internal Medicine

## 2017-02-28 VITALS — BP 136/82 | HR 89 | Ht 62.0 in | Wt 204.8 lb

## 2017-02-28 DIAGNOSIS — E039 Hypothyroidism, unspecified: Secondary | ICD-10-CM | POA: Diagnosis not present

## 2017-02-28 DIAGNOSIS — J4520 Mild intermittent asthma, uncomplicated: Secondary | ICD-10-CM

## 2017-02-28 DIAGNOSIS — F329 Major depressive disorder, single episode, unspecified: Secondary | ICD-10-CM

## 2017-02-28 DIAGNOSIS — E038 Other specified hypothyroidism: Secondary | ICD-10-CM

## 2017-02-28 DIAGNOSIS — Z0000 Encounter for general adult medical examination without abnormal findings: Secondary | ICD-10-CM | POA: Diagnosis not present

## 2017-02-28 DIAGNOSIS — Z79899 Other long term (current) drug therapy: Secondary | ICD-10-CM

## 2017-02-28 DIAGNOSIS — E785 Hyperlipidemia, unspecified: Secondary | ICD-10-CM

## 2017-02-28 LAB — HEPATIC FUNCTION PANEL
ALBUMIN: 4.3 g/dL (ref 3.5–5.2)
ALK PHOS: 74 U/L (ref 39–117)
ALT: 16 U/L (ref 0–35)
AST: 21 U/L (ref 0–37)
Bilirubin, Direct: 0.1 mg/dL (ref 0.0–0.3)
Total Bilirubin: 0.4 mg/dL (ref 0.2–1.2)
Total Protein: 7.4 g/dL (ref 6.0–8.3)

## 2017-02-28 LAB — LIPID PANEL
CHOLESTEROL: 213 mg/dL — AB (ref 0–200)
HDL: 46.9 mg/dL (ref >39.00)
LDL Cholesterol: 144 mg/dL — ABNORMAL HIGH (ref 0–99)
NonHDL: 166.53
TRIGLYCERIDES: 113 mg/dL (ref 0.0–149.0)
Total CHOL/HDL Ratio: 5
VLDL: 22.6 mg/dL (ref 0.0–40.0)

## 2017-02-28 LAB — CBC WITH DIFFERENTIAL/PLATELET
BASOS ABS: 0 10*3/uL (ref 0.0–0.1)
BASOS PCT: 0.8 % (ref 0.0–3.0)
Eosinophils Absolute: 0.1 10*3/uL (ref 0.0–0.7)
Eosinophils Relative: 2 % (ref 0.0–5.0)
HCT: 41.3 % (ref 36.0–46.0)
Hemoglobin: 13.5 g/dL (ref 12.0–15.0)
LYMPHS ABS: 1.8 10*3/uL (ref 0.7–4.0)
Lymphocytes Relative: 35.6 % (ref 12.0–46.0)
MCHC: 32.6 g/dL (ref 30.0–36.0)
MCV: 86.2 fl (ref 78.0–100.0)
Monocytes Absolute: 0.5 10*3/uL (ref 0.1–1.0)
Monocytes Relative: 10.1 % (ref 3.0–12.0)
NEUTROS ABS: 2.5 10*3/uL (ref 1.4–7.7)
NEUTROS PCT: 51.5 % (ref 43.0–77.0)
PLATELETS: 229 10*3/uL (ref 150.0–400.0)
RBC: 4.79 Mil/uL (ref 3.87–5.11)
RDW: 14.1 % (ref 11.5–15.5)
WBC: 4.9 10*3/uL (ref 4.0–10.5)

## 2017-02-28 LAB — BASIC METABOLIC PANEL
BUN: 26 mg/dL — ABNORMAL HIGH (ref 6–23)
CALCIUM: 10 mg/dL (ref 8.4–10.5)
CHLORIDE: 98 meq/L (ref 96–112)
CO2: 32 meq/L (ref 19–32)
CREATININE: 0.87 mg/dL (ref 0.40–1.20)
GFR: 69.8 mL/min (ref >60.00)
GLUCOSE: 90 mg/dL (ref 70–99)
Potassium: 4.4 mEq/L (ref 3.5–5.1)
Sodium: 136 mEq/L (ref 135–145)

## 2017-02-28 LAB — T4, FREE: Free T4: 0.67 ng/dL (ref 0.60–1.60)

## 2017-02-28 LAB — HEMOGLOBIN A1C: HEMOGLOBIN A1C: 5.8 % (ref 4.6–6.5)

## 2017-02-28 LAB — TSH: TSH: 5.92 u[IU]/mL — ABNORMAL HIGH (ref 0.35–4.50)

## 2017-02-28 MED ORDER — ESTRADIOL 0.1 MG/GM VA CREA: 1.0000 | TOPICAL_CREAM | Freq: Every day | VAGINAL | 1 refills | Status: DC | PRN

## 2017-02-28 NOTE — Patient Instructions (Signed)
Will notify you  of labs when available.   Get some exercise    Music .  Avoid  Lots of screen time .Marland Kitchen  Sleep cycling  See  An ophthalmologist  And if needed can do   More evaluation if needed.     Health Maintenance, Female Adopting a healthy lifestyle and getting preventive care can go a long way to promote health and wellness. Talk with your health care provider about what schedule of regular examinations is right for you. This is a good chance for you to check in with your provider about disease prevention and staying healthy. In between checkups, there are plenty of things you can do on your own. Experts have done a lot of research about which lifestyle changes and preventive measures are most likely to keep you healthy. Ask your health care provider for more information. Weight and diet Eat a healthy diet  Be sure to include plenty of vegetables, fruits, low-fat dairy products, and lean protein.  Do not eat a lot of foods high in solid fats, added sugars, or salt.  Get regular exercise. This is one of the most important things you can do for your health. ? Most adults should exercise for at least 150 minutes each week. The exercise should increase your heart rate and make you sweat (moderate-intensity exercise). ? Most adults should also do strengthening exercises at least twice a week. This is in addition to the moderate-intensity exercise.  Maintain a healthy weight  Body mass index (BMI) is a measurement that can be used to identify possible weight problems. It estimates body fat based on height and weight. Your health care provider can help determine your BMI and help you achieve or maintain a healthy weight.  For females 45 years of age and older: ? A BMI below 18.5 is considered underweight. ? A BMI of 18.5 to 24.9 is normal. ? A BMI of 25 to 29.9 is considered overweight. ? A BMI of 30 and above is considered obese.  Watch levels of cholesterol and blood lipids  You  should start having your blood tested for lipids and cholesterol at 64 years of age, then have this test every 5 years.  You may need to have your cholesterol levels checked more often if: ? Your lipid or cholesterol levels are high. ? You are older than 64 years of age. ? You are at high risk for heart disease.  Cancer screening Lung Cancer  Lung cancer screening is recommended for adults 51-40 years old who are at high risk for lung cancer because of a history of smoking.  A yearly low-dose CT scan of the lungs is recommended for people who: ? Currently smoke. ? Have quit within the past 15 years. ? Have at least a 30-pack-year history of smoking. A pack year is smoking an average of one pack of cigarettes a day for 1 year.  Yearly screening should continue until it has been 15 years since you quit.  Yearly screening should stop if you develop a health problem that would prevent you from having lung cancer treatment.  Breast Cancer  Practice breast self-awareness. This means understanding how your breasts normally appear and feel.  It also means doing regular breast self-exams. Let your health care provider know about any changes, no matter how small.  If you are in your 20s or 30s, you should have a clinical breast exam (CBE) by a health care provider every 1-3 years as part of a  regular health exam.  If you are 40 or older, have a CBE every year. Also consider having a breast X-ray (mammogram) every year.  If you have a family history of breast cancer, talk to your health care provider about genetic screening.  If you are at high risk for breast cancer, talk to your health care provider about having an MRI and a mammogram every year.  Breast cancer gene (BRCA) assessment is recommended for women who have family members with BRCA-related cancers. BRCA-related cancers include: ? Breast. ? Ovarian. ? Tubal. ? Peritoneal cancers.  Results of the assessment will determine the  need for genetic counseling and BRCA1 and BRCA2 testing.  Cervical Cancer Your health care provider may recommend that you be screened regularly for cancer of the pelvic organs (ovaries, uterus, and vagina). This screening involves a pelvic examination, including checking for microscopic changes to the surface of your cervix (Pap test). You may be encouraged to have this screening done every 3 years, beginning at age 28.  For women ages 22-65, health care providers may recommend pelvic exams and Pap testing every 3 years, or they may recommend the Pap and pelvic exam, combined with testing for human papilloma virus (HPV), every 5 years. Some types of HPV increase your risk of cervical cancer. Testing for HPV may also be done on women of any age with unclear Pap test results.  Other health care providers may not recommend any screening for nonpregnant women who are considered low risk for pelvic cancer and who do not have symptoms. Ask your health care provider if a screening pelvic exam is right for you.  If you have had past treatment for cervical cancer or a condition that could lead to cancer, you need Pap tests and screening for cancer for at least 20 years after your treatment. If Pap tests have been discontinued, your risk factors (such as having a new sexual partner) need to be reassessed to determine if screening should resume. Some women have medical problems that increase the chance of getting cervical cancer. In these cases, your health care provider may recommend more frequent screening and Pap tests.  Colorectal Cancer  This type of cancer can be detected and often prevented.  Routine colorectal cancer screening usually begins at 64 years of age and continues through 64 years of age.  Your health care provider may recommend screening at an earlier age if you have risk factors for colon cancer.  Your health care provider may also recommend using home test kits to check for hidden blood  in the stool.  A small camera at the end of a tube can be used to examine your colon directly (sigmoidoscopy or colonoscopy). This is done to check for the earliest forms of colorectal cancer.  Routine screening usually begins at age 54.  Direct examination of the colon should be repeated every 5-10 years through 64 years of age. However, you may need to be screened more often if early forms of precancerous polyps or small growths are found.  Skin Cancer  Check your skin from head to toe regularly.  Tell your health care provider about any new moles or changes in moles, especially if there is a change in a mole's shape or color.  Also tell your health care provider if you have a mole that is larger than the size of a pencil eraser.  Always use sunscreen. Apply sunscreen liberally and repeatedly throughout the day.  Protect yourself by wearing long sleeves, pants,  a wide-brimmed hat, and sunglasses whenever you are outside.  Heart disease, diabetes, and high blood pressure  High blood pressure causes heart disease and increases the risk of stroke. High blood pressure is more likely to develop in: ? People who have blood pressure in the high end of the normal range (130-139/85-89 mm Hg). ? People who are overweight or obese. ? People who are African American.  If you are 28-21 years of age, have your blood pressure checked every 3-5 years. If you are 43 years of age or older, have your blood pressure checked every year. You should have your blood pressure measured twice-once when you are at a hospital or clinic, and once when you are not at a hospital or clinic. Record the average of the two measurements. To check your blood pressure when you are not at a hospital or clinic, you can use: ? An automated blood pressure machine at a pharmacy. ? A home blood pressure monitor.  If you are between 40 years and 74 years old, ask your health care provider if you should take aspirin to prevent  strokes.  Have regular diabetes screenings. This involves taking a blood sample to check your fasting blood sugar level. ? If you are at a normal weight and have a low risk for diabetes, have this test once every three years after 64 years of age. ? If you are overweight and have a high risk for diabetes, consider being tested at a younger age or more often. Preventing infection Hepatitis B  If you have a higher risk for hepatitis B, you should be screened for this virus. You are considered at high risk for hepatitis B if: ? You were born in a country where hepatitis B is common. Ask your health care provider which countries are considered high risk. ? Your parents were born in a high-risk country, and you have not been immunized against hepatitis B (hepatitis B vaccine). ? You have HIV or AIDS. ? You use needles to inject street drugs. ? You live with someone who has hepatitis B. ? You have had sex with someone who has hepatitis B. ? You get hemodialysis treatment. ? You take certain medicines for conditions, including cancer, organ transplantation, and autoimmune conditions.  Hepatitis C  Blood testing is recommended for: ? Everyone born from [birth year] through 1967. ? Anyone with known risk factors for hepatitis C.  Sexually transmitted infections (STIs)  You should be screened for sexually transmitted infections (STIs) including gonorrhea and chlamydia if: ? You are sexually active and are younger than 64 years of age. ? You are older than 64 years of age and your health care provider tells you that you are at risk for this type of infection. ? Your sexual activity has changed since you were last screened and you are at an increased risk for chlamydia or gonorrhea. Ask your health care provider if you are at risk.  If you do not have HIV, but are at risk, it may be recommended that you take a prescription medicine daily to prevent HIV infection. This is called pre-exposure prophylaxis  (PrEP). You are considered at risk if: ? You are sexually active and do not regularly use condoms or know the HIV status of your partner(s). ? You take drugs by injection. ? You are sexually active with a partner who has HIV.  Talk with your health care provider about whether you are at high risk of being infected with HIV. If you choose  to begin PrEP, you should first be tested for HIV. You should then be tested every 3 months for as long as you are taking PrEP. Pregnancy  If you are premenopausal and you may become pregnant, ask your health care provider about preconception counseling.  If you may become pregnant, take 400 to 800 micrograms (mcg) of folic acid every day.  If you want to prevent pregnancy, talk to your health care provider about birth control (contraception). Osteoporosis and menopause  Osteoporosis is a disease in which the bones lose minerals and strength with aging. This can result in serious bone fractures. Your risk for osteoporosis can be identified using a bone density scan.  If you are 53 years of age or older, or if you are at risk for osteoporosis and fractures, ask your health care provider if you should be screened.  Ask your health care provider whether you should take a calcium or vitamin D supplement to lower your risk for osteoporosis.  Menopause may have certain physical symptoms and risks.  Hormone replacement therapy may reduce some of these symptoms and risks. Talk to your health care provider about whether hormone replacement therapy is right for you. Follow these instructions at home:  Schedule regular health, dental, and eye exams.  Stay current with your immunizations.  Do not use any tobacco products including cigarettes, chewing tobacco, or electronic cigarettes.  If you are pregnant, do not drink alcohol.  If you are breastfeeding, limit how much and how often you drink alcohol.  Limit alcohol intake to no more than 1 drink per day for  nonpregnant women. One drink equals 12 ounces of beer, 5 ounces of wine, or 1 ounces of hard liquor.  Do not use street drugs.  Do not share needles.  Ask your health care provider for help if you need support or information about quitting drugs.  Tell your health care provider if you often feel depressed.  Tell your health care provider if you have ever been abused or do not feel safe at home. This information is not intended to replace advice given to you by your health care provider. Make sure you discuss any questions you have with your health care provider. Document Released: 07/05/2010 Document Revised: 05/28/2015 Document Reviewed: 09/23/2014 Elsevier Interactive Patient Education  [employer].

## 2017-03-01 ENCOUNTER — Other Ambulatory Visit: Payer: Self-pay | Admitting: Internal Medicine

## 2017-03-01 DIAGNOSIS — R7989 Other specified abnormal findings of blood chemistry: Secondary | ICD-10-CM

## 2017-03-03 NOTE — Telephone Encounter (Signed)
prob not effecting weight but    If it did would be less than a 10# effect.

## 2017-03-30 ENCOUNTER — Other Ambulatory Visit: Payer: 59

## 2017-03-31 ENCOUNTER — Other Ambulatory Visit (INDEPENDENT_AMBULATORY_CARE_PROVIDER_SITE_OTHER): Payer: 59

## 2017-03-31 DIAGNOSIS — R7989 Other specified abnormal findings of blood chemistry: Secondary | ICD-10-CM | POA: Diagnosis not present

## 2017-03-31 LAB — TSH: TSH: 5.46 u[IU]/mL — ABNORMAL HIGH (ref 0.35–4.50)

## 2017-03-31 LAB — T4, FREE: Free T4: 0.74 ng/dL (ref 0.60–1.60)

## 2017-04-03 LAB — THYROID ANTIBODIES: Thyroglobulin Ab: 1 IU/mL (ref <=1)

## 2017-04-06 ENCOUNTER — Encounter: Payer: Self-pay | Admitting: Internal Medicine

## 2017-04-07 NOTE — Telephone Encounter (Signed)
   Ok to wait on medication  until  Next visit and repeat  tsh and free t4     opthalmologic   Dr Maris Bergerhristine Mccuen  ?sp

## 2017-04-10 ENCOUNTER — Encounter: Payer: Self-pay | Admitting: Internal Medicine

## 2017-04-10 DIAGNOSIS — R7989 Other specified abnormal findings of blood chemistry: Secondary | ICD-10-CM

## 2017-04-12 NOTE — Telephone Encounter (Signed)
Pt is requesting to start Thyroid meds.   Please advise Dr Fabian SharpPanosh, thanks.

## 2017-04-13 MED ORDER — LEVOTHYROXINE SODIUM 25 MCG PO TABS: 25.0000 ug | ORAL_TABLET | Freq: Every day | ORAL | 1 refills | Status: DC

## 2017-04-13 NOTE — Telephone Encounter (Signed)
Pleas send in  Levothyroxine 25 mcg take one po qd on an empty stomach disp 90 Refill x 1

## 2017-04-14 NOTE — Telephone Encounter (Signed)
Levothyroxine sent to pharmacy Pt aware. Nothing further needed.

## 2017-06-12 ENCOUNTER — Encounter: Payer: Self-pay | Admitting: Internal Medicine

## 2017-06-13 NOTE — Telephone Encounter (Signed)
noted 

## 2017-07-04 ENCOUNTER — Other Ambulatory Visit: Payer: Self-pay | Admitting: Specialist

## 2017-07-04 DIAGNOSIS — S2242XA Multiple fractures of ribs, left side, initial encounter for closed fracture: Secondary | ICD-10-CM

## 2017-07-14 ENCOUNTER — Other Ambulatory Visit: Payer: 59

## 2017-07-14 ENCOUNTER — Other Ambulatory Visit (INDEPENDENT_AMBULATORY_CARE_PROVIDER_SITE_OTHER): Payer: 59

## 2017-07-14 ENCOUNTER — Encounter: Payer: Self-pay | Admitting: Internal Medicine

## 2017-07-14 DIAGNOSIS — R7989 Other specified abnormal findings of blood chemistry: Secondary | ICD-10-CM

## 2017-07-14 LAB — TSH: TSH: 2.57 u[IU]/mL (ref 0.35–4.50)

## 2017-07-14 LAB — T4, FREE: Free T4: 0.92 ng/dL (ref 0.60–1.60)

## 2017-07-14 NOTE — Telephone Encounter (Signed)
Sent to Dr. Fabian Sharppanosh as Lorain ChildesFYI

## 2017-07-19 ENCOUNTER — Ambulatory Visit: Payer: 59 | Admitting: Internal Medicine

## 2017-07-19 ENCOUNTER — Encounter: Payer: Self-pay | Admitting: Internal Medicine

## 2017-07-19 VITALS — BP 144/90 | HR 101 | Wt 210.0 lb

## 2017-07-19 DIAGNOSIS — R030 Elevated blood-pressure reading, without diagnosis of hypertension: Secondary | ICD-10-CM | POA: Diagnosis not present

## 2017-07-19 DIAGNOSIS — M5481 Occipital neuralgia: Secondary | ICD-10-CM | POA: Diagnosis not present

## 2017-07-19 DIAGNOSIS — Z79899 Other long term (current) drug therapy: Secondary | ICD-10-CM

## 2017-07-19 DIAGNOSIS — Z23 Encounter for immunization: Secondary | ICD-10-CM | POA: Diagnosis not present

## 2017-07-19 DIAGNOSIS — E039 Hypothyroidism, unspecified: Secondary | ICD-10-CM | POA: Diagnosis not present

## 2017-07-19 DIAGNOSIS — E038 Other specified hypothyroidism: Secondary | ICD-10-CM

## 2017-07-19 NOTE — Patient Instructions (Addendum)
Want ot make sure your blood  Pressure is in range .   Goal is below 140/90  .  If elevated then plan follow up    Weight gain .      Could have been related to   Fracture  Busy schedule and  Less to the thyroid . Possible gabapentin.   But is helpful can  Make measures to help.    Limit evening eating  Fresh fruit not processed   Or dried fruit.   No calories .  In beverages  Goal.    Track what you do .

## 2017-07-19 NOTE — Progress Notes (Signed)
Chief Complaint  Patient presents with  . Follow-up    discuss results    HPI: Toni [name] 62 y.o. come in for fu thyroid  Etc   Taking low dose  Thyroid As best possible  Nose noted   Depression doing well at this time but has takenon a lot .   Began hone assoc     Help.   Raising grandson in 2 separate places husband and her .    Memory about the same and doing ok .  wegiht issues getting worse .   Neurontin.  And why gaining weight.  gs has ptsd.   Eye doctor.   To be seen   [facility]   Ct since 27-May-2016  Left frontal chonric infarct?   Last  July 4th .   Head inury with occipital neuralgia  .  Noted the head ct  From 2016/05/27 showed  abnormality that was new  chornic infarct    ? Is there something else  To do .   Left foot fx  Fall in heels for event  Still in boot getting better   ? Due for td ? ROS: See pertinent positives and negatives per HPI.  Past Medical History:  Diagnosis Date  . Acute hepatitis C without mention of hepatic coma(070.51)    No treatment  . Brachial neuritis or radiculitis NOS   . Depressive disorder, not elsewhere classified   . Essential and other specified forms of tremor   . Headache(784.0)   . Memory loss   . Microscopic hematuria    seed dr Patsi Sears in past  . Migraine, unspecified, without mention of intractable migraine without mention of status migrainosus   . Other specified disorders of rotator cuff syndrome of shoulder and allied disorders   . Vulvar pain    rx with valium supp    Family History  Problem Relation Age of Onset  . Headache Mother   . Stroke Father        father passed away june17  . Diabetes Maternal Grandmother   . Other Son        Mother is deceased May 28, 2011 father moved to the area 05/27/12.    Social History   Socioeconomic History  . Marital status: Divorced    Spouse name: Not on file  . Number of children: 3  . Years of education: Not on file  . Highest education level:  Not on file  Occupational History  . Not on file  Social Needs  . Financial resource strain: Not on file  . Food insecurity:    Worry: Not on file    Inability: Not on file  . Transportation needs:    Medical: Not on file    Non-medical: Not on file  Tobacco Use  . Smoking status: Former Smoker    Types: Cigarettes    Last attempt to quit: 05/23/1978    Years since quitting: 39.1  . Smokeless tobacco: Never Used  Substance and Sexual Activity  . Alcohol use: No    Alcohol/week: 0.0 oz  . Drug use: No  . Sexual activity: Not on file  Lifestyle  . Physical activity:    Days per week: Not on file    Minutes per session: Not on file  . Stress: Not on file  Relationships  . Social connections:    Talks on phone: Not on file    Gets together: Not on file    Attends religious service: Not  on file    Active member of club or organization: Not on file    Attends meetings of clubs or organizations: Not on file    Relationship status: Not on file  Other Topics Concern  . Not on file  Social History Narrative   Does not sleep well at night   Lives alone   Has two cats   On Environmental managerDisability   College graduate previous worked for [employer]. On disability for depression and short-term memory problems. Dr. love.   She is gravida 3 para 3   Father passed away  June 17    Outpatient Medications Prior to Visit  Medication Sig Dispense Refill  . albuterol (PROVENTIL HFA;VENTOLIN HFA) 108 (90 Base) MCG/ACT inhaler Inhale 2 puffs into the lungs every 6 (six) hours as needed for wheezing or shortness of breath. 1 Inhaler 0  . ALPRAZolam (XANAX) 1 MG tablet Take 1 mg by mouth 4 (four) times daily as needed for anxiety (shakes).    . cetirizine (ZYRTEC) 10 MG tablet Take 10 mg by mouth daily as needed for allergies.    . cyclobenzaprine (FLEXERIL) 10 MG tablet Take 1 tablet (10 mg total) by mouth 2 (two) times daily as needed. Muscle spasms/headaches 40 tablet 0  . desvenlafaxine (PRISTIQ)  50 MG 24 hr tablet Take 50 mg by mouth daily.    Marland Kitchen. dextroamphetamine (DEXEDRINE SPANSULE) 15 MG 24 hr capsule Take 30-45 mg by mouth daily.     Marland Kitchen. eletriptan (RELPAX) 40 MG tablet Take 1 tablet (40 mg total) by mouth every 2 (two) hours as needed for migraine. Take 1 tablet at onset of migraine. May take another dose after 2 hours. 20 tablet 11  . estradiol (ESTRACE) 0.1 MG/GM vaginal cream Place 1 Applicatorful vaginally daily as needed. 42.5 g 1  . FLUoxetine (PROZAC) 20 MG capsule Take 1 capsule by mouth daily.  0  . gabapentin (NEURONTIN) 600 MG tablet Take 600 mg by mouth 2 (two) times daily.   1  . HYDROcodone-ibuprofen (VICOPROFEN) 7.5-200 MG per tablet Take 0.5-1 tablets by mouth 4 (four) times daily as needed for moderate pain or severe pain.     Marland Kitchen. ketoconazole (NIZORAL) 2 % cream APPLY 1 APPLICATION TOPICALLY 2 (TWO) TIMES DAILY. 30 g 0  . levothyroxine (SYNTHROID, LEVOTHROID) 25 MCG tablet Take 1 tablet (25 mcg total) by mouth daily before breakfast. Take on an empty stomach 90 tablet 1  . Misc Natural Products (ESTROVEN + ENERGY MAX STRENGTH) TABS Take by mouth daily.    . Multiple Vitamins-Minerals (CENTRUM SILVER ADULT 50+) TABS Take 1 tablet by mouth daily.    . SUMAtriptan (IMITREX) 25 MG tablet Take 1 tablet (25 mg total) by mouth as needed for migraine. May repeat in 2 hours if headache persists or recurs. 10 tablet 11  . benzonatate (TESSALON) 100 MG capsule Take 1 capsule (100 mg total) by mouth 2 (two) times daily as needed for cough. (Patient not taking: Reported on 07/19/2017) 20 capsule 1   No facility-administered medications prior to visit.      EXAM:  BP (!) 144/90   Pulse (!) 101   Wt 210 lb (95.3 kg)   BMI 38.41 kg/m   Body mass index is 38.41 kg/m.  GENERAL: vitals reviewed and listed above, alert, oriented, appears well hydrated and in no acute distress  Anxiety but  Interactive  Halting speech but  Positive talking  HEENT: atraumatic, conjunctiva  clear, no  obvious abnormalities on inspection  of external nose and ears  MS: moves all extremities left foot in boot   PSYCH: pleasant and cooperative, n speech some times  Paused for word findings  Lab Results  Component Value Date   WBC 4.9 02/28/2017   HGB 13.5 02/28/2017   HCT 41.3 02/28/2017   PLT 229.0 02/28/2017   GLUCOSE 90 02/28/2017   CHOL 213 (H) 02/28/2017   TRIG 113.0 02/28/2017   HDL 46.90 02/28/2017   LDLCALC 144 (H) 02/28/2017   ALT 16 02/28/2017   AST 21 02/28/2017   NA 136 02/28/2017   K 4.4 02/28/2017   CL 98 02/28/2017   CREATININE 0.87 02/28/2017   BUN 26 (H) 02/28/2017   CO2 32 02/28/2017   TSH 2.57 07/14/2017   HGBA1C 5.8 02/28/2017   BP Readings from Last 3 Encounters:  07/19/17 (!) 144/90  02/28/17 136/82  02/21/17 136/72   Wt Readings from Last 3 Encounters:  07/19/17 210 lb (95.3 kg)  02/28/17 204 lb 12.8 oz (92.9 kg)  02/21/17 206 lb (93.4 kg)    ASSESSMENT AND PLAN:  Discussed the following assessment and plan:  Subclinical hypothyroidism  Medication management  Occipital neuralgia of right side  Elevated blood pressure reading  Need for tetanus booster - Plan: Tetanus vaccine IM Weight gain provide  from a number of factors  Counseled.  Ask  Dr Karel Jarvis  If any  other interventions advised beside prevention head trauma and  cv factors  Ok for update td today after hx of fall etc and due  Counseled.  Plan rov in 4 months Total visit > 50% spent counseling and coordinating care as indicated in above note and in instructions to patient .   -Patient advised to return or notify health care team  if  new concerns arise.  Patient Instructions  Want ot make sure your blood  Pressure is in range .   Goal is below 140/90  .  If elevated then plan follow up    Weight gain .      Could have been related to   Fracture  Busy schedule and  Less to the thyroid . Possible gabapentin.   But is helpful can  Make measures to help.    Limit evening  eating  Fresh fruit not processed   Or dried fruit.   No calories .  In beverages  Goal.    Track what you do .      Neta Mends. Bayler Gehrig M.D.

## 2017-07-21 NOTE — Addendum Note (Signed)
Addended by: Aniceto BossNIMMONS, SYLVIA A on: 07/21/2017 01:34 PM   Modules accepted: Orders

## 2017-07-30 ENCOUNTER — Other Ambulatory Visit: Payer: Self-pay | Admitting: Family Medicine

## 2017-07-31 NOTE — Telephone Encounter (Signed)
This is a patient of Dr. Panosh  

## 2017-07-31 NOTE — Telephone Encounter (Signed)
Last OV 07/13/2017   Last refilled 09/14/2015 disp 30 g with no refills   Sent to PCP to advise

## 2017-08-04 ENCOUNTER — Emergency Department (HOSPITAL_BASED_OUTPATIENT_CLINIC_OR_DEPARTMENT_OTHER): Payer: 59

## 2017-08-04 ENCOUNTER — Encounter (HOSPITAL_BASED_OUTPATIENT_CLINIC_OR_DEPARTMENT_OTHER): Payer: Self-pay | Admitting: Emergency Medicine

## 2017-08-04 ENCOUNTER — Other Ambulatory Visit: Payer: Self-pay

## 2017-08-04 ENCOUNTER — Emergency Department (HOSPITAL_BASED_OUTPATIENT_CLINIC_OR_DEPARTMENT_OTHER)
Admission: EM | Admit: 2017-08-04 | Discharge: 2017-08-04 | Disposition: A | Discharge status: Home / Self Care | Payer: 59 | Attending: Emergency Medicine | Admitting: Emergency Medicine

## 2017-08-04 DIAGNOSIS — Z79899 Other long term (current) drug therapy: Secondary | ICD-10-CM | POA: Diagnosis not present

## 2017-08-04 DIAGNOSIS — Z87891 Personal history of nicotine dependence: Secondary | ICD-10-CM | POA: Insufficient documentation

## 2017-08-04 DIAGNOSIS — W19XXXA Unspecified fall, initial encounter: Secondary | ICD-10-CM | POA: Insufficient documentation

## 2017-08-04 DIAGNOSIS — F19920 Other psychoactive substance use, unspecified with intoxication, uncomplicated: Secondary | ICD-10-CM | POA: Diagnosis not present

## 2017-08-04 DIAGNOSIS — R1032 Left lower quadrant pain: Secondary | ICD-10-CM | POA: Diagnosis present

## 2017-08-04 NOTE — ED Provider Notes (Addendum)
[facility] Provider Note: Toni Dell, MD, FACEP  CSN: [id number] MRN: [id number] ARRIVAL: 08/04/17 at 0049 ROOM: [facility]04/[facility]04   CHIEF COMPLAINT  Fall   HISTORY OF PRESENT ILLNESS  08/04/17 2:43 AM [name] Toni [name] is a 62 y.o. female who fell yesterday afternoon about 2 PM when picking up her grandson.  She fell onto her left side.  She is complaining of severe, sharp pain in her left medial groin.  The pain is only present when she moves her leg or attempts to ambulate.  There is no tenderness on palpation or passive movement.  The patient admits to taking her evening medications which includes Vicoprofen, gabapentin and Xanax.  Her speech is slurred and she is having difficulty staying awake.   Past Medical History:  Diagnosis Date  . Acute hepatitis C without mention of hepatic coma(070.51)    No treatment  . Brachial neuritis or radiculitis NOS   . Depressive disorder, not elsewhere classified   . Essential and other specified forms of tremor   . Headache(784.0)   . Memory loss   . Microscopic hematuria    seed dr Patsi Sears in past  . Migraine, unspecified, without mention of intractable migraine without mention of status migrainosus   . Other specified disorders of rotator cuff syndrome of shoulder and allied disorders   . Vulvar pain    rx with valium supp    Past Surgical History:  Procedure Laterality Date  . BREAST EXCISIONAL BIOPSY Right    No scar  . CESAREAN SECTION     x3  . SHOULDER ARTHROSCOPY W/ ROTATOR CUFF REPAIR Right 2002/05/28  . SHOULDER ARTHROSCOPY W/ ROTATOR CUFF REPAIR Left 05/28/2006    Family History  Problem Relation Age of Onset  . Headache Mother   . Stroke Father        father passed away june17  . Diabetes Maternal Grandmother   . Other Son        Mother is deceased 05/28/2011 father moved to the area 05-27-2012.    Social History   Tobacco Use  . Smoking status: Former Smoker    Types: Cigarettes    Last attempt to quit:  05/23/1978    Years since quitting: 39.2  . Smokeless tobacco: Never Used  Substance Use Topics  . Alcohol use: No    Alcohol/week: 0.0 oz  . Drug use: No    Prior to Admission medications   Medication Sig Start Date End Date Taking? Authorizing Provider  albuterol (PROVENTIL HFA;VENTOLIN HFA) 108 (90 Base) MCG/ACT inhaler Inhale 2 puffs into the lungs every 6 (six) hours as needed for wheezing or shortness of breath. 07/04/16   Gordy Savers, MD  ALPRAZolam Prudy Feeler) 1 MG tablet Take 1 mg by mouth 4 (four) times daily as needed for anxiety (shakes).    [provider]  cetirizine (ZYRTEC) 10 MG tablet Take 10 mg by mouth daily as needed for allergies.    [provider]  cyclobenzaprine (FLEXERIL) 10 MG tablet Take 1 tablet (10 mg total) by mouth 2 (two) times daily as needed. Muscle spasms/headaches 10/07/15   Panosh, Neta Mends, MD  desvenlafaxine (PRISTIQ) 50 MG 24 hr tablet Take 50 mg by mouth daily.    [provider]  dextroamphetamine (DEXEDRINE SPANSULE) 15 MG 24 hr capsule Take 30-45 mg by mouth daily.     Milagros Evener, MD  eletriptan (RELPAX) 40 MG tablet Take 1 tablet (40 mg total) by mouth every 2 (two)  hours as needed for migraine. Take 1 tablet at onset of migraine. May take another dose after 2 hours. 01/28/16   Van ClinesAquino, Karen M, MD  estradiol (ESTRACE) 0.1 MG/GM vaginal cream Place 1 Applicatorful vaginally daily as needed. 02/28/17   Panosh, Neta MendsWanda K, MD  FLUoxetine (PROZAC) 20 MG capsule Take 1 capsule by mouth daily. 10/09/14   [provider]  gabapentin (NEURONTIN) 600 MG tablet Take 600 mg by mouth 2 (two) times daily.  02/02/17   [provider]  HYDROcodone-ibuprofen (VICOPROFEN) 7.5-200 MG per tablet Take 0.5-1 tablets by mouth 4 (four) times daily as needed for moderate pain or severe pain.  04/20/12   [provider]  ketoconazole (NIZORAL) 2 % cream APPLY TO AFFECTED AREA TWICE A DAY 08/01/17   Panosh, Neta MendsWanda K, MD    levothyroxine (SYNTHROID, LEVOTHROID) 25 MCG tablet Take 1 tablet (25 mcg total) by mouth daily before breakfast. Take on an empty stomach 04/13/17   Panosh, Neta MendsWanda K, MD  Misc Natural Products (ESTROVEN + ENERGY MAX STRENGTH) TABS Take by mouth daily.    [provider]  Multiple Vitamins-Minerals (CENTRUM SILVER ADULT 50+) TABS Take 1 tablet by mouth daily.    [provider]  SUMAtriptan (IMITREX) 25 MG tablet Take 1 tablet (25 mg total) by mouth as needed for migraine. May repeat in 2 hours if headache persists or recurs. 02/21/17   Van ClinesAquino, Karen M, MD    Allergies Codeine   REVIEW OF SYSTEMS  Negative except as noted here or in the History of Present Illness.   PHYSICAL EXAMINATION  Initial Vital Signs Blood pressure 108/67, pulse 72, temperature 97.8 F (36.6 C), temperature source Oral, resp. rate 14, height 5\' 3"  (1.6 m), weight 94.3 kg (208 lb), SpO2 97 %.  Examination General: Well-developed, well-nourished female in no acute distress; appearance consistent with age of record HENT: normocephalic; atraumatic Eyes: pupils equal, round and reactive to light; extraocular muscles intact Neck: supple Heart: regular rate and rhythm Lungs: clear to auscultation bilaterally Abdomen: soft; nondistended; nontender; bowel sounds present Extremities: No deformity; pulses normal; no tenderness of left groin or left hip pain with passive range of motion of left hip; pain in medial left groin on active movement of left hip Neurologic: Sleeping but arousable; dysarthria; motor function intact in all extremities and symmetric except limited exam in left lower extremity; no facial droop Skin: Warm and dry   RESULTS  Summary of this visit's results, reviewed by myself:   EKG Interpretation  Date/Time:    Ventricular Rate:    PR Interval:    QRS Duration:   QT Interval:    QTC Calculation:   R Axis:     Text Interpretation:        Laboratory Studies: No results  found for this or any previous visit (from the past 24 hour(s)). Imaging Studies: Dg Hip Unilat W Or Wo Pelvis 2-3 Views Left  Result Date: 08/04/2017 CLINICAL DATA:  Felt 12 hours ago.  LEFT groin pain. EXAM: DG HIP (WITH OR WITHOUT PELVIS) 2-3V LEFT COMPARISON:  None. FINDINGS: Femoral heads are well formed and located. Faint linear lucency projects LEFT superior pubic ramus only on frontal radiograph. Hip joint spaces are intact. Mild LEFT femoral head spurring compatible with osteoarthrosis. Sacroiliac joints are symmetric. No destructive bony lesions. Included soft tissue planes are non-suspicious. IMPRESSION: Linear lucency superior pubic ramus seen on single view favored as artifact. Mild LEFT hip osteoarthrosis. Electronically Signed   By: Pernell Dupreourtnay  Bloomer M.D.   On: 08/04/2017 02:42    ED COURSE and MDM  Nursing notes and initial vitals signs, including pulse oximetry, reviewed.  Vitals:   08/04/17 0057 08/04/17 0303  BP:  108/67  Pulse:  72  Resp:  14  Temp:  97.8 F (36.6 C)  TempSrc:  Oral  SpO2:  97%  Weight: 94.3 kg (208 lb)   Height: 5\' 3"  (1.6 m)    The location of the patient's pain correlates with the findings seen on plain films.  This suggest a left superior pubic ramus fracture.  I do not believe a CT is necessary as this fracture, if present, would be weightbearing as tolerated.  Differential diagnosis also includes a groin muscle pull although as noted above there is no tenderness of the left groin.  As noted above she is already on chronic narcotic pain medication.  We will advise her to be weightbearing as tolerated.  PROCEDURES    ED DIAGNOSES     ICD-10-CM   1. Fall, initial encounter W19.XXXA   2. Left groin pain R10.32   3. Drug intoxication without complication (HCC) U98.119        Paula Libra, MD 08/04/17 0308    Paula Libra, MD 08/04/17 [phone]

## 2017-08-04 NOTE — ED Notes (Signed)
Returned from xray

## 2017-08-04 NOTE — ED Notes (Signed)
Family member went to get a different vehicle to take pt home in  Will discharge pt when he returns

## 2017-08-04 NOTE — ED Triage Notes (Signed)
Pt fell yesterday at 1400 when picking up her grandson. Pt fell on to left side. Pt c/o left hip/groin pain. Pt has taken vicoprofen, xanax and gabapentin at home without relief.

## 2017-08-05 ENCOUNTER — Encounter (HOSPITAL_COMMUNITY): Payer: Self-pay | Admitting: *Deleted

## 2017-08-05 ENCOUNTER — Observation Stay (HOSPITAL_COMMUNITY)
Admission: EM | Admit: 2017-08-05 | Discharge: 2017-08-10 | Disposition: A | Discharge status: Home / Self Care | Payer: 59 | Attending: Emergency Medicine | Admitting: Emergency Medicine

## 2017-08-05 ENCOUNTER — Emergency Department (HOSPITAL_COMMUNITY): Payer: 59

## 2017-08-05 ENCOUNTER — Other Ambulatory Visit: Payer: Self-pay

## 2017-08-05 DIAGNOSIS — R52 Pain, unspecified: Secondary | ICD-10-CM

## 2017-08-05 DIAGNOSIS — R1032 Left lower quadrant pain: Secondary | ICD-10-CM | POA: Diagnosis present

## 2017-08-05 DIAGNOSIS — F411 Generalized anxiety disorder: Secondary | ICD-10-CM | POA: Diagnosis present

## 2017-08-05 DIAGNOSIS — Z87891 Personal history of nicotine dependence: Secondary | ICD-10-CM | POA: Diagnosis not present

## 2017-08-05 DIAGNOSIS — Z79899 Other long term (current) drug therapy: Secondary | ICD-10-CM | POA: Insufficient documentation

## 2017-08-05 DIAGNOSIS — R262 Difficulty in walking, not elsewhere classified: Secondary | ICD-10-CM

## 2017-08-05 LAB — BASIC METABOLIC PANEL
ANION GAP: 8 (ref 5–15)
BUN: 14 mg/dL (ref 8–23)
CALCIUM: 9 mg/dL (ref 8.9–10.3)
CO2: 30 mmol/L (ref 22–32)
Chloride: 102 mmol/L (ref 98–111)
Creatinine, Ser: 0.74 mg/dL (ref 0.44–1.00)
GFR calc Af Amer: >60 mL/min (ref >60)
GFR calc non Af Amer: >60 mL/min (ref >60)
Glucose, Bld: 98 mg/dL (ref 70–99)
Potassium: 3.9 mmol/L (ref 3.5–5.1)
Sodium: 140 mmol/L (ref 135–145)

## 2017-08-05 LAB — CBC
HCT: 41.3 % (ref 36.0–46.0)
Hemoglobin: 12.5 g/dL (ref 12.0–15.0)
MCH: 27.7 pg (ref 26.0–34.0)
MCHC: 30.3 g/dL (ref 30.0–36.0)
MCV: 91.6 fL (ref 78.0–100.0)
Platelets: 189 10*3/uL (ref 150–400)
RBC: 4.51 MIL/uL (ref 3.87–5.11)
RDW: 12.9 % (ref 11.5–15.5)
WBC: 6.2 10*3/uL (ref 4.0–10.5)

## 2017-08-05 MED ORDER — HYDROMORPHONE HCL 1 MG/ML IJ SOLN
1.0000 mg | INTRAMUSCULAR | Status: DC | PRN
  Administered 2017-08-06 (×2): 1 mg via INTRAVENOUS
  Filled 2017-08-05 (×2): qty 1

## 2017-08-05 MED ORDER — LORAZEPAM 0.5 MG PO TABS
0.5000 mg | ORAL_TABLET | Freq: Once | ORAL | Status: AC
  Administered 2017-08-05: 0.5 mg via ORAL
  Filled 2017-08-05: qty 1

## 2017-08-05 MED ORDER — SODIUM CHLORIDE 0.9% FLUSH: 3.0000 mL | INTRAVENOUS | Status: DC | PRN

## 2017-08-05 MED ORDER — ENOXAPARIN SODIUM 40 MG/0.4ML ~~LOC~~ SOLN
40.0000 mg | Freq: Every day | SUBCUTANEOUS | Status: DC
  Administered 2017-08-06 – 2017-08-10 (×5): 40 mg via SUBCUTANEOUS
  Filled 2017-08-05 (×5): qty 0.4

## 2017-08-05 MED ORDER — ONDANSETRON HCL 4 MG/2ML IJ SOLN
4.0000 mg | Freq: Four times a day (QID) | INTRAMUSCULAR | Status: DC | PRN
  Administered 2017-08-07: 4 mg via INTRAVENOUS
  Filled 2017-08-05: qty 2

## 2017-08-05 MED ORDER — VENLAFAXINE HCL ER 75 MG PO CP24
75.0000 mg | ORAL_CAPSULE | Freq: Every day | ORAL | Status: DC
  Administered 2017-08-06: 75 mg via ORAL
  Filled 2017-08-05: qty 1

## 2017-08-05 MED ORDER — FLUOXETINE HCL 20 MG PO CAPS
20.0000 mg | ORAL_CAPSULE | Freq: Every day | ORAL | Status: DC
  Administered 2017-08-06 – 2017-08-10 (×5): 20 mg via ORAL
  Filled 2017-08-05 (×5): qty 1

## 2017-08-05 MED ORDER — SODIUM CHLORIDE 0.9 % IV SOLN
250.0000 mL | INTRAVENOUS | Status: DC | PRN
Start: 2017-08-05 — End: 2017-08-10

## 2017-08-05 MED ORDER — KETOROLAC TROMETHAMINE 15 MG/ML IJ SOLN
15.0000 mg | Freq: Once | INTRAMUSCULAR | Status: AC
  Administered 2017-08-05: 15 mg via INTRAMUSCULAR
  Filled 2017-08-05: qty 1

## 2017-08-05 MED ORDER — ALPRAZOLAM 0.5 MG PO TABS
1.0000 mg | ORAL_TABLET | Freq: Four times a day (QID) | ORAL | Status: DC | PRN
  Administered 2017-08-06 – 2017-08-10 (×10): 1 mg via ORAL
  Filled 2017-08-05 (×10): qty 2

## 2017-08-05 MED ORDER — ACETAMINOPHEN 650 MG RE SUPP: 650.0000 mg | Freq: Four times a day (QID) | RECTAL | Status: DC | PRN

## 2017-08-05 MED ORDER — GABAPENTIN 600 MG PO TABS
600.0000 mg | ORAL_TABLET | Freq: Three times a day (TID) | ORAL | Status: DC
  Administered 2017-08-06 – 2017-08-10 (×14): 600 mg via ORAL
  Filled 2017-08-05 (×14): qty 1

## 2017-08-05 MED ORDER — ALBUTEROL SULFATE (2.5 MG/3ML) 0.083% IN NEBU: 2.5000 mg | INHALATION_SOLUTION | Freq: Four times a day (QID) | RESPIRATORY_TRACT | Status: DC | PRN

## 2017-08-05 MED ORDER — SODIUM CHLORIDE 0.9% FLUSH
3.0000 mL | Freq: Two times a day (BID) | INTRAVENOUS | Status: DC
  Administered 2017-08-06 – 2017-08-10 (×9): 3 mL via INTRAVENOUS

## 2017-08-05 MED ORDER — METHOCARBAMOL 500 MG PO TABS
500.0000 mg | ORAL_TABLET | Freq: Four times a day (QID) | ORAL | Status: DC | PRN
  Administered 2017-08-06 – 2017-08-09 (×2): 500 mg via ORAL
  Filled 2017-08-05 (×2): qty 1

## 2017-08-05 MED ORDER — HYDROCODONE-IBUPROFEN 7.5-200 MG PO TABS: 0.5000 | ORAL_TABLET | Freq: Four times a day (QID) | ORAL | 0 refills | Status: DC | PRN

## 2017-08-05 MED ORDER — ACETAMINOPHEN 325 MG PO TABS
650.0000 mg | ORAL_TABLET | Freq: Four times a day (QID) | ORAL | Status: DC | PRN
  Administered 2017-08-07: 650 mg via ORAL
  Filled 2017-08-05 (×2): qty 2

## 2017-08-05 MED ORDER — HYDROMORPHONE HCL 1 MG/ML IJ SOLN
1.0000 mg | Freq: Once | INTRAMUSCULAR | Status: AC
  Administered 2017-08-05: 1 mg via INTRAMUSCULAR
  Filled 2017-08-05: qty 1

## 2017-08-05 MED ORDER — LEVOTHYROXINE SODIUM 25 MCG PO TABS
25.0000 ug | ORAL_TABLET | Freq: Every day | ORAL | Status: DC
  Administered 2017-08-06 – 2017-08-10 (×5): 25 ug via ORAL
  Filled 2017-08-05 (×5): qty 1

## 2017-08-05 NOTE — ED Triage Notes (Signed)
Waiting for SW 

## 2017-08-05 NOTE — ED Provider Notes (Addendum)
Patient was seen previously by Dr. Loura Backho Hoch.  Please see his note.  CT scan does not show any evidence of fracture or dislocation.  Patient's pain has improved with treatment in the emergency room.  I do not think there is any indication for hospitalization at this time considering this appears to be a muscle strain.  Patient is agreeable to this plan.  We did discuss her pain management at home.  I [facility].  Patient did run out of her pain medications.  Pt would have been due for a refill last week.  I will give her a few day course of her meds to cover her until she sees her pain doctor on Monday.  Clinical Course as of Aug 05 2028  Sat Aug 05, 2017  1740 Vicoprofen Rx on 6/27, 30 day supply   [JK]  2001 Pt attempted to ambulate.  She has severe pain.  She does not think she will be able to manage at home   [JK]    Clinical Course User Index [JK] Linwood DibblesKnapp, Shuntay Everetts, MD      Linwood DibblesKnapp, [place] Lohmeyer, MD 08/05/17 1752  Family asked to speak to me after I spoke with the patient.  They states she is not able to go home.  I will ask case management to see if we can get home health.     Linwood DibblesKnapp, Anders Hohmann, MD 08/05/17 1824 I spoke with case management.  We do not have anyone physically in the hospital over the weekend.  Patient will need PT and OT in order to get home health.  We do not have that here in the ED.  Patient was not able to ambulate despite pain medications.  Family is indicating that they cannot take her home.  I will consult the medical service to see if we can bring her in for pain management.  Could consider an MRI to make sure there is no occult hip fracture.   Linwood DibblesKnapp, Kahlani Graber, MD 08/05/17 2030

## 2017-08-05 NOTE — H&P (Signed)
[facility] H&P   Patient Demographics:    Toni [name], is a 62 y.o. female  MRN: [id number]   DOB - 07/15/1955  Admit Date - 08/05/2017  Outpatient Primary MD for the patient is Panosh, Neta MendsWanda K, MD  Referring MD/NP/PA: Iantha FallenJohn Knapp  Outpatient Specialists:      Patient coming from:    home  Chief Complaint  Patient presents with  . Groin Pain      HPI:    Toni [name]  is a 62 y.o. female, w Hepatitis C, Depression, Migraines, Cervicalgia, who presents with c/o left groin pain since fall on Thursday.  Pt was evaluated in ER 8/2, and then again earlier on 8/3.   In ED,  CT left hip  IMPRESSION: 1. No evidence of acute fracture or dislocation at the left hip. Previously questioned fracture of the left superior pubic ramus demonstrates no definite CT correlate. Image quality is degraded by body habitus. 2. Left hip degenerative changes.  No significant joint effusion. 3. No periarticular hematoma.  Wbc 6.2, Hgb 12.5, Plt 189 Na 140, K 3.9, Bun 14, Creatinine 0.74  Asked by ER to evaluate the patient for left groin pain.       Review of systems:    In addition to the HPI above,  No Fever-chills, No Headache, No changes with Vision or hearing, No problems swallowing food or Liquids, No Chest pain, Cough or Shortness of Breath, No Abdominal pain, No Nausea or Vommitting, Bowel movements are regular, No Blood in stool or Urine, No dysuria, No new skin rashes or bruises,  No new weakness, tingling, numbness in any extremity, No recent weight gain or loss, No polyuria, polydypsia or polyphagia, No significant Mental Stressors.  A full 10 point Review of Systems was done, except as stated above, all other Review of Systems were negative.   With Past History of the following :    Past Medical History:  Diagnosis Date  . Acute hepatitis C without  mention of hepatic coma(070.51)    No treatment  . Brachial neuritis or radiculitis NOS   . Depressive disorder, not elsewhere classified   . Essential and other specified forms of tremor   . Headache(784.0)   . Memory loss   . Microscopic hematuria    [name] in past  . Migraine, unspecified, without mention of intractable migraine without mention of status migrainosus   . Other specified disorders of rotator cuff syndrome of shoulder and allied disorders   . Vulvar pain    rx with valium supp      Past Surgical History:  Procedure Laterality Date  . BREAST EXCISIONAL BIOPSY Right    No scar  . CESAREAN SECTION     x3  . SHOULDER ARTHROSCOPY W/ ROTATOR CUFF REPAIR Right 2004  . SHOULDER ARTHROSCOPY W/ ROTATOR CUFF REPAIR Left 2008  Social History:     Social History   Tobacco Use  . Smoking status: Former Smoker    Types: Cigarettes    Last attempt to quit: 05/23/1978    Years since quitting: 39.2  . Smokeless tobacco: Never Used  Substance Use Topics  . Alcohol use: No    Alcohol/week: 0.0 oz     Lives - at home  Mobility - typically walks without assistance.    Family History :     Family History  Problem Relation Age of Onset  . Headache Mother   . Stroke Father        father passed away june17  . Diabetes Maternal Grandmother   . Other Son        Mother is deceased 2011/05/19 father moved to the area 05/18/12.      Home Medications:   Prior to Admission medications   Medication Sig Start Date End Date Taking? Authorizing Provider  albuterol (PROVENTIL HFA;VENTOLIN HFA) 108 (90 Base) MCG/ACT inhaler Inhale 2 puffs into the lungs every 6 (six) hours as needed for wheezing or shortness of breath. 07/04/16  Yes Gordy Savers, MD  ALPRAZolam Prudy Feeler) 1 MG tablet Take 1 mg by mouth 4 (four) times daily as needed for anxiety (shakes).   Yes [provider]  alum hydroxide-mag trisilicate (GAVISCON) 80-20 MG CHEW chewable tablet Chew 2  tablets by mouth as needed for indigestion or heartburn.   Yes [provider]  cetirizine (ZYRTEC) 10 MG tablet Take 10 mg by mouth daily as needed for allergies.   Yes [provider]  desvenlafaxine (PRISTIQ) 50 MG 24 hr tablet Take 50 mg by mouth daily.   Yes [provider]  dextroamphetamine (DEXEDRINE SPANSULE) 15 MG 24 hr capsule Take 30-45 mg by mouth daily.    Yes Milagros Evener, MD  estradiol (ESTRACE) 0.1 MG/GM vaginal cream Place 1 Applicatorful vaginally daily as needed. 02/28/17  Yes Panosh, Neta Mends, MD  FLUoxetine (PROZAC) 20 MG capsule Take 1 capsule by mouth daily. 10/09/14  Yes [provider]  gabapentin (NEURONTIN) 600 MG tablet Take 600 mg by mouth 3 (three) times daily.  02/02/17  Yes [provider]  ketoconazole (NIZORAL) 2 % cream APPLY TO AFFECTED AREA TWICE A DAY 08/01/17  Yes Panosh, Neta Mends, MD  levothyroxine (SYNTHROID, LEVOTHROID) 25 MCG tablet Take 1 tablet (25 mcg total) by mouth daily before breakfast. Take on an empty stomach 04/13/17  Yes Panosh, Neta Mends, MD  Misc Natural Products (ESTROVEN + ENERGY MAX STRENGTH) TABS Take by mouth daily.   Yes [provider]  Multiple Vitamins-Minerals (CENTRUM SILVER ADULT 50+) TABS Take 1 tablet by mouth daily.   Yes [provider]  SUMAtriptan (IMITREX) 25 MG tablet Take 1 tablet (25 mg total) by mouth as needed for migraine. May repeat in 2 hours if headache persists or recurs. 02/21/17  Yes Van Clines, MD  cyclobenzaprine (FLEXERIL) 10 MG tablet Take 1 tablet (10 mg total) by mouth 2 (two) times daily as needed. Muscle spasms/headaches Patient not taking: Reported on 08/05/2017 10/07/15   Panosh, Neta Mends, MD  eletriptan (RELPAX) 40 MG tablet Take 1 tablet (40 mg total) by mouth every 2 (two) hours as needed for migraine. Take 1 tablet at onset of migraine. May take another dose after 2 hours. Patient not taking: Reported on 08/05/2017 01/28/16   Van Clines, MD    HYDROcodone-ibuprofen (VICOPROFEN) 7.5-200 MG tablet Take 0.5-1 tablets by mouth 4 (  four) times daily as needed for moderate pain or severe pain. 08/05/17   Linwood Dibbles, MD     Allergies:     Allergies  Allergen Reactions  . Codeine Nausea And Vomiting and Other (See Comments)    Shuts down GI tract     Physical Exam:   Vitals  Blood pressure (!) 95/58, pulse 64, temperature (!) 97.5 F (36.4 C), temperature source Oral, resp. rate 18, height 5\' 3"  (1.6 m), weight 206 lb (93.4 kg), SpO2 93 %.   1. General lying in bed in NAD  2. Normal affect and insight, Not Suicidal or Homicidal, Awake Alert, Oriented X 3.  3. No F.N deficits, ALL C.Nerves Intact, Strength 5/5 all 4 extremities, Sensation intact all 4 extremities, Plantars down going.  4. Ears and Eyes appear Normal, Conjunctivae clear, PERRLA. Moist Oral Mucosa.  5. Supple Neck, No JVD, No cervical lymphadenopathy appriciated, No Carotid Bruits.  6. Symmetrical Chest wall movement, Good air movement bilaterally, CTAB.  7. RRR, No Gallops, Rubs or Murmurs, No Parasternal Heave.  8. Positive Bowel Sounds, Abdomen Soft, No tenderness, No organomegaly appriciated,No rebound -guarding or rigidity.  9.  No Cyanosis, Normal Skin Turgor, No Skin Rash or Bruise.  10. Good muscle tone,  joints appear normal , no effusions, Normal ROM.  11. No Palpable Lymph Nodes in Neck or Axillae   SLR equivocal 5-/5 plantar flexion (left) Pain with movement of left leg Pt unable to walk   Data Review:    CBC Recent Labs  Lab 08/05/17 1941  WBC 6.2  HGB 12.5  HCT 41.3  PLT 189  MCV 91.6  MCH 27.7  MCHC 30.3  RDW 12.9   ------------------------------------------------------------------------------------------------------------------  Chemistries  Recent Labs  Lab 08/05/17 1941  NA 140  K 3.9  CL 102  CO2 30  GLUCOSE 98  BUN 14  CREATININE 0.74  CALCIUM 9.0    ------------------------------------------------------------------------------------------------------------------ estimated creatinine clearance is 78.2 mL/min (by C-G formula based on SCr of 0.74 mg/dL). ------------------------------------------------------------------------------------------------------------------ No results for input(s): TSH, T4TOTAL, T3FREE, THYROIDAB in the last 72 hours.  Invalid input(s): FREET3  Coagulation profile No results for input(s): INR, PROTIME in the last 168 hours. ------------------------------------------------------------------------------------------------------------------- No results for input(s): DDIMER in the last 72 hours. -------------------------------------------------------------------------------------------------------------------  Cardiac Enzymes No results for input(s): CKMB, TROPONINI, MYOGLOBIN in the last 168 hours.  Invalid input(s): CK ------------------------------------------------------------------------------------------------------------------ No results found for: BNP   ---------------------------------------------------------------------------------------------------------------  Urinalysis    Component Value Date/Time   COLORURINE YELLOW 06/05/2014 1556   APPEARANCEUR CLEAR 06/05/2014 1556   LABSPEC 1.015 06/05/2014 1556   PHURINE 5.0 06/05/2014 1556   GLUCOSEU NEGATIVE 06/05/2014 1556   HGBUR NEGATIVE 06/05/2014 1556   BILIRUBINUR NEGATIVE 06/05/2014 1556   KETONESUR NEGATIVE 06/05/2014 1556   PROTEINUR NEGATIVE 06/05/2014 1556   UROBILINOGEN 0.2 06/05/2014 1556   NITRITE NEGATIVE 06/05/2014 1556   LEUKOCYTESUR NEGATIVE 06/05/2014 1556    ----------------------------------------------------------------------------------------------------------------   Imaging Results:    Ct Hip Left Wo Contrast  Result Date: 08/05/2017 CLINICAL DATA:  Left groin pain with limited weight-bearing since falling 2 days  ago. EXAM: CT OF THE LEFT HIP WITHOUT CONTRAST TECHNIQUE: Multidetector CT imaging of the left hip was performed according to the standard protocol. Multiplanar CT image reconstructions were also generated. COMPARISON:  Left hip radiographs 08/04/2017. FINDINGS: Bones/Joint/Cartilage Examination is limited to the left hip and inferior left hemipelvis. Image quality is suboptimal, in part secondary to body habitus. No acute fracture or dislocation is demonstrated. There are  mild left hip degenerative changes with osteophytes of the femoral neck and subchondral cyst formation anteriorly in the acetabulum. No definite abnormality of the left superior pubic ramus as questioned on prior radiographs. No significant hip joint effusion. The sacroiliac joints are not imaged. Ligaments Not relevant for exam/indication. Muscles and Tendons Unremarkable. Soft tissues No focal hematoma or other fluid collection demonstrated. IMPRESSION: 1. No evidence of acute fracture or dislocation at the left hip. Previously questioned fracture of the left superior pubic ramus demonstrates no definite CT correlate. Image quality is degraded by body habitus. 2. Left hip degenerative changes.  No significant joint effusion. 3. No periarticular hematoma. Electronically Signed   By: Carey Bullocks M.D.   On: 08/05/2017 17:25   Dg Hip Unilat W Or Wo Pelvis 2-3 Views Left  Result Date: 08/04/2017 CLINICAL DATA:  Felt 12 hours ago.  LEFT groin pain. EXAM: DG HIP (WITH OR WITHOUT PELVIS) 2-3V LEFT COMPARISON:  None. FINDINGS: Femoral heads are well formed and located. Faint linear lucency projects LEFT superior pubic ramus only on frontal radiograph. Hip joint spaces are intact. Mild LEFT femoral head spurring compatible with osteoarthrosis. Sacroiliac joints are symmetric. No destructive bony lesions. Included soft tissue planes are non-suspicious. IMPRESSION: Linear lucency superior pubic ramus seen on single view favored as artifact. Mild LEFT  hip osteoarthrosis. Electronically Signed   By: Awilda Metro M.D.   On: 08/04/2017 02:42       Assessment & Plan:    Active Problems:   * No active hospital problems. *    Left groin pain, Left buttock pain MRI L hip MRI L spine PT to evaluate and tx  Anxiety (GAD)/ Depression Pristique=> Effexor per formulary Xanax prn     DVT Prophylaxis  Lovenox - SCDs  AM Labs Ordered, also please review Full Orders  Family Communication: Admission, patients condition and plan of care including tests being ordered have been discussed with the patient who indicate understanding and agree with the plan and Code Status.  Code Status  FULL CODE  Likely DC to  home  Condition GUARDED    Consults called: PT  Admission status: observation  Time spent in minutes : 60   Pearson Grippe M.D on 08/05/2017 at 9:19 PM  Between 7am to 7pm - Pager - [phone]  . After 7pm go to [email or web address] - password [facility]  [facility] - Office  [phone]

## 2017-08-05 NOTE — ED Notes (Signed)
Admitting at bedside 

## 2017-08-05 NOTE — ED Triage Notes (Signed)
PT from home today via EMS with  LT hip/LT leg /Lt groin after she fell on Thursday. [facility]. Pt did not receive pain meds at DC because of the conflict  with  Current meds PT is now taking. Pt provided this info.

## 2017-08-05 NOTE — Discharge Instructions (Signed)
Follow-up with your primary care doctor.  Consider getting physical therapy if the symptoms are not improving in the next few days.  Try applying ice to help with the swelling and discomfort.

## 2017-08-05 NOTE — ED Notes (Signed)
Patient transported to CT 

## 2017-08-05 NOTE — ED Notes (Signed)
Pt walk with walker from bed, around room, to other side of bed. Pt is slow, but supports self very well. Reports groin pain with each step.

## 2017-08-06 ENCOUNTER — Observation Stay (HOSPITAL_COMMUNITY): Payer: 59

## 2017-08-06 DIAGNOSIS — R1032 Left lower quadrant pain: Secondary | ICD-10-CM | POA: Diagnosis not present

## 2017-08-06 LAB — HIV ANTIBODY (ROUTINE TESTING W REFLEX): HIV SCREEN 4TH GENERATION: NONREACTIVE

## 2017-08-06 LAB — CBC
HEMATOCRIT: 37.6 % (ref 36.0–46.0)
HEMOGLOBIN: 11.5 g/dL — AB (ref 12.0–15.0)
MCH: 27.4 pg (ref 26.0–34.0)
MCHC: 30.6 g/dL (ref 30.0–36.0)
MCV: 89.5 fL (ref 78.0–100.0)
PLATELETS: 187 10*3/uL (ref 150–400)
RBC: 4.2 MIL/uL (ref 3.87–5.11)
RDW: 13 % (ref 11.5–15.5)
WBC: 5.4 10*3/uL (ref 4.0–10.5)

## 2017-08-06 LAB — COMPREHENSIVE METABOLIC PANEL
ALT: 20 U/L (ref 0–44)
AST: 19 U/L (ref 15–41)
Albumin: 3.2 g/dL — ABNORMAL LOW (ref 3.5–5.0)
Alkaline Phosphatase: 68 U/L (ref 38–126)
Anion gap: 8 (ref 5–15)
BUN: 17 mg/dL (ref 8–23)
CHLORIDE: 105 mmol/L (ref 98–111)
CO2: 27 mmol/L (ref 22–32)
CREATININE: 0.78 mg/dL (ref 0.44–1.00)
Calcium: 8.7 mg/dL — ABNORMAL LOW (ref 8.9–10.3)
Glucose, Bld: 98 mg/dL (ref 70–99)
POTASSIUM: 3.7 mmol/L (ref 3.5–5.1)
SODIUM: 140 mmol/L (ref 135–145)
Total Bilirubin: 0.4 mg/dL (ref 0.3–1.2)
Total Protein: 6.3 g/dL — ABNORMAL LOW (ref 6.5–8.1)

## 2017-08-06 MED ORDER — DESVENLAFAXINE SUCCINATE ER 50 MG PO TB24
50.0000 mg | ORAL_TABLET | Freq: Every day | ORAL | Status: DC
  Administered 2017-08-06 – 2017-08-10 (×5): 50 mg via ORAL
  Filled 2017-08-06 (×5): qty 1

## 2017-08-06 MED ORDER — OXYCODONE HCL 5 MG PO TABS
5.0000 mg | ORAL_TABLET | Freq: Four times a day (QID) | ORAL | Status: DC | PRN
  Administered 2017-08-06 – 2017-08-08 (×5): 5 mg via ORAL
  Filled 2017-08-06 (×5): qty 1

## 2017-08-06 MED ORDER — LORAZEPAM 2 MG/ML IJ SOLN
0.5000 mg | Freq: Once | INTRAMUSCULAR | Status: AC
  Administered 2017-08-06: 0.5 mg via INTRAVENOUS
  Filled 2017-08-06: qty 1

## 2017-08-06 NOTE — Evaluation (Signed)
Physical Therapy Evaluation Patient Details Name: Toni MaxcyRhonda Lynn [name] MRN: [id number] DOB: 01/06/1955 Today's Date: 08/06/2017   History of Present Illness  Pt is a 62 y.o. female, with PMH of hepatitis C, depression, migraines, cervicalgia, and 5th metatarsal fx 06-10-17. She presented with c/o left groin pain since fall on Thursday. Xrays negative for fx.     Clinical Impression  Pt admitted with above diagnosis. Pt currently with functional limitations due to the deficits listed below (see PT Problem List). PTA pt lived alone independent with mobility. She did recently transition off a RW following 5th metatarsal fx in June 2019. On eval, pt required min assist bed mobility, min guard assist transfers and supervision ambulation 10 feet x 2 with RW. Pt perseverating on pain, demonstrating increased speed and more fluid mobility when distracted/engaged in conversation. She moves very slowly requiring significantly increased time to perform functional mobility. Pt will benefit from skilled PT to increase their independence and safety with mobility to allow discharge to the venue listed below.       Follow Up Recommendations Home health PT;Supervision - Intermittent    Equipment Recommendations  None recommended by PT    Recommendations for Other Services       Precautions / Restrictions Precautions Precautions: Fall Restrictions Weight Bearing Restrictions: No      Mobility  Bed Mobility Overal bed mobility: Needs Assistance Bed Mobility: Sit to Supine     Supine to sit: Min assist;HOB elevated Sit to supine: Min assist;HOB elevated   General bed mobility comments: +rail, increased time and effort, instructed in hooking LLE with RLE to assist in/out of bed  Transfers Overall transfer level: Needs assistance Equipment used: Rolling walker (2 wheeled) Transfers: Sit to/from Stand Sit to Stand: Min guard         General transfer comment: increased time and effort,  reliant on BUE for power up  Ambulation/Gait Ambulation/Gait assistance: Supervision Gait Distance (Feet): 10 Feet(x 2) Assistive device: Rolling walker (2 wheeled) Gait Pattern/deviations: Step-to pattern;Antalgic Gait velocity: decreased Gait velocity interpretation: <1.8 ft/sec, indicate of risk for recurrent falls General Gait Details: very slow, guarded gait  Stairs            Wheelchair Mobility    Modified Rankin (Stroke Patients Only)       Balance Overall balance assessment: Needs assistance Sitting-balance support: No upper extremity supported;Feet supported Sitting balance-Leahy Scale: Good     Standing balance support: Bilateral upper extremity supported;During functional activity Standing balance-Leahy Scale: Fair Standing balance comment: Pt stood at sink x 5 minutes to wash her face and brush her teeth.                             Pertinent Vitals/Pain Pain Assessment: 0-10 Pain Score: 10-Worst pain ever Pain Location: L groin with mobility Pain Descriptors / Indicators: Sharp;Sore;Grimacing;Guarding Pain Intervention(s): Monitored during session;Limited activity within patient's tolerance;Premedicated before session    Home Living Family/patient expects to be discharged to:: Private residence Living Arrangements: Alone   Type of Home: Other(Comment)(townhouse) Home Access: Level entry     Home Layout: Able to live on main level with bedroom/bathroom Home Equipment: Walker - 2 wheels      Prior Function Level of Independence: Independent               Hand Dominance        Extremity/Trunk Assessment        Lower Extremity Assessment Lower  Extremity Assessment: LLE deficits/detail LLE: Unable to fully assess due to pain    Cervical / Trunk Assessment Cervical / Trunk Assessment: Normal  Communication   Communication: No difficulties  Cognition Arousal/Alertness: Awake/alert Behavior During Therapy: WFL for  tasks assessed/performed Overall Cognitive Status: Within Functional Limits for tasks assessed                                 General Comments: Complicated family hx. Has joint custody with her ex-husband of her 69 y.o. grandson. Chronic pain      General Comments      Exercises     Assessment/Plan    PT Assessment Patient needs continued PT services  PT Problem List Decreased mobility;Decreased activity tolerance;Decreased balance;Pain       PT Treatment Interventions DME instruction;Therapeutic activities;Gait training;Therapeutic exercise;Patient/family education;Balance training;Functional mobility training    PT Goals (Current goals can be found in the Care Plan section)  Acute Rehab PT Goals Patient Stated Goal: decrease pain PT Goal Formulation: With patient Time For Goal Achievement: 08/20/17 Potential to Achieve Goals: Good    Frequency Min 3X/week   Barriers to discharge Decreased caregiver support      Co-evaluation               AM-PAC PT "6 Clicks" Daily Activity  Outcome Measure Difficulty turning over in bed (including adjusting bedclothes, sheets and blankets)?: A Lot Difficulty moving from lying on back to sitting on the side of the bed? : A Lot Difficulty sitting down on and standing up from a chair with arms (e.g., wheelchair, bedside commode, etc,.)?: A Lot Help needed moving to and from a bed to chair (including a wheelchair)?: A Little Help needed walking in hospital room?: A Little Help needed climbing 3-5 steps with a railing? : A Lot 6 Click Score: 14    End of Session Equipment Utilized During Treatment: Gait belt Activity Tolerance: Patient limited by pain Patient left: in bed;with call bell/phone within reach Nurse Communication: Mobility status PT Visit Diagnosis: Pain;Difficulty in walking, not elsewhere classified (R26.2);Other abnormalities of gait and mobility (R26.89) Pain - Right/Left: Left Pain - part of  body: Hip    Time: 1610-9604 PT Time Calculation (min) (ACUTE ONLY): 39 min   Charges:   PT Evaluation $PT Eval Moderate Complexity: 1 Mod PT Treatments $Gait Training: 23-37 mins        Aida Raider, PT  Office # [phone] Pager [phone]   Ilda Foil 08/06/2017, 9:54 AM

## 2017-08-06 NOTE — Care Management Note (Signed)
Case Management Note  Patient Details  Name: Toni [name] MRN: [id number] Date of Birth: 03/11/1955  Subjective/Objective:          Pt presents with Left "groin"/hip pain that has persisted and causes patient not to be able to ambulate x 4 days.  Pt came to ED x 2 for same complaint.  Staff reports patient cries out in pain when tries to move or walk and requires high/frequent doses of pain medication. There has been no known cause of pain thus far.  Pain started when pt tried to lift 5 yr old grandson.  Attempted to talk to patient about post-discharge plans, including HH PT and care of grandson.  Pt states she and her ex-husband have joint custody of her 64 year old grandson.  She has him most of the time and he attends daycare 8-5 at [employer].  Pt went into a lot of detail about how her son and the child's mother have long-standing issues with substance abuse and domestic violence.  She states she feels that her ex-husband is responsible to take care of the child for short periods but she financially provides for th ex-husband and makes all important decisions.  She states DSS does not have an open case on her grandson currently.  She feels she is fine to drive starting tomorrow and go home if nothing is discovered as the cause of her pain.  She plans to call on the [facility] to provide assistance as needed and wants home health PT.  While pt was describing to me all of the complex social situation, she began to make paranoid comments asking why I was there and who I was.  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  Pt states she has had a psychiatrist that helps her with depression.  As I left the room, pt called the desk demanding pain medicine.   Staff reports she recently had large doses and will not be due for more.         Action/Plan: [facility]  Pt states she wants to call a friend to ask advice on agency and call her insurance company.  I attempted to assure patient that it was not necessary for her to call insurance company but she states that she "wants to be in control".    CSW consult for social situation.  [facility] to assess home social situation.    Expected Discharge Date:                  Expected Discharge Plan:  Home w Home Health Services  In-House Referral:  Clinical Social Work  Discharge planning Services  CM Consult  Post Acute Care Choice:  Home Health Choice offered to:  Patient  DME Arranged:    DME Agency:     HH Arranged:    HH Agency:     Status of Service:  In process, will continue to follow  If discussed at Long Length of Stay Meetings, dates discussed:    Additional Comments:  Deveron Furlongshley  Marlies Ligman, RN 08/06/2017, 3:45 PM

## 2017-08-06 NOTE — Progress Notes (Signed)
PROGRESS NOTE    Toni [name]  AVW:[id number] DOB: 1955-11-15 DOA: 08/05/2017 PCP: Madelin Headings, MD   Brief Narrative: Toni [name]  is a 62 y.o. female, w Hepatitis C, Depression, Migraines, Cervicalgia, who presents with c/o left groin pain since fall on Thursday.  Pt was evaluated in ER 8/2, and then again earlier on 8/3.   In ED,  CT left hip  IMPRESSION: 1. No evidence of acute fracture or dislocation at the left hip. Previously questioned fracture of the left superior pubic ramus demonstrates no definite CT correlate. Image quality is degraded by body habitus. 2. Left hip degenerative changes. No significant joint effusion. 3. No periarticular hematoma.  Wbc 6.2, Hgb 12.5, Plt 189 Na 140, K 3.9, Bun 14, Creatinine 0.74  Asked by ER to evaluate the patient for left groin pain     Assessment & Plan:   Principal Problem:   Left groin pain Active Problems:   Generalized anxiety disorder   1-left groin, leg pain. Secondary to left superior and inferior pubic rami fracture.  CT hip negative for fracture.  MRI hip confirm . MRI confirms the presence of nondisplaced acute fractures of the left superior and inferior pubic rami. Continue with pain management.  PT evaluation.  MRI lumbar spine; spondylosis.    2-depression;  Continue with meds   DVT prophylaxis: lovenox Code Status: full code.  Family Communication: care discussed with patient.  Disposition Plan: remain in the hospital for pain controlled. She is still requiring IV dilaudid for pain controlled.    Consultants:  none  Procedures:   none   Antimicrobials; none   Subjective: She is having a lot pain right groin area, leg,.. She is not able to move her leg due to pain.  She is also under a lot of stress and social situation with her grandson.   Objective: Vitals:   08/05/17 2340 08/06/17 0306 08/06/17 0832 08/06/17 1645  BP: 130/75 136/69 117/60 (!) 108/91    Pulse: 79 81 85 73  Resp: 18 18 18 17   Temp: 97.7 F (36.5 C) 98 F (36.7 C) 98.3 F (36.8 C) 98.2 F (36.8 C)  TempSrc: Oral Oral Oral Oral  SpO2: 98% 100% 100% 95%  Weight: 95.3 kg (210 lb 1.6 oz)     Height: 5\' 3"  (1.6 m)       Intake/Output Summary (Last 24 hours) at 08/06/2017 1733 Last data filed at 08/05/2017 2340 Gross per 24 hour  Intake -  Output 250 ml  Net -250 ml   Filed Weights   08/05/17 1323 08/05/17 2340  Weight: 93.4 kg (206 lb) 95.3 kg (210 lb 1.6 oz)    Examination:  General exam: Appears calm and comfortable  Respiratory system: Clear to auscultation. Respiratory effort normal. Cardiovascular system: S1 & S2 heard, RRR. No JVD, murmurs, rubs, gallops or clicks. No pedal edema. Gastrointestinal system: Abdomen is nondistended, soft and nontender. No organomegaly or masses felt. Normal bowel sounds heard. Central nervous system: Alert and oriented.  Extremities: unable to move right leg due to severe pain.  Skin: No rashes, lesions or ulcers   Data Reviewed: I have personally reviewed following labs and imaging studies  CBC: Recent Labs  Lab 08/05/17 1941 08/06/17 0541  WBC 6.2 5.4  HGB 12.5 11.5*  HCT 41.3 37.6  MCV 91.6 89.5  PLT 189 187   Basic Metabolic Panel: Recent Labs  Lab 08/05/17 1941 08/06/17 0541  NA 140 140  K 3.9 3.7  CL 102 105  CO2 30 27  GLUCOSE 98 98  BUN 14 17  CREATININE 0.74 0.78  CALCIUM 9.0 8.7*   GFR: Estimated Creatinine Clearance: 79.1 mL/min (by C-G formula based on SCr of 0.78 mg/dL). Liver Function Tests: Recent Labs  Lab 08/06/17 0541  AST 19  ALT 20  ALKPHOS 68  BILITOT 0.4  PROT 6.3*  ALBUMIN 3.2*   No results for input(s): LIPASE, AMYLASE in the last 168 hours. No results for input(s): AMMONIA in the last 168 hours. Coagulation Profile: No results for input(s): INR, PROTIME in the last 168 hours. Cardiac Enzymes: No results for input(s): CKTOTAL, CKMB, CKMBINDEX, TROPONINI in the last  168 hours. BNP (last 3 results) No results for input(s): PROBNP in the last 8760 hours. HbA1C: No results for input(s): HGBA1C in the last 72 hours. CBG: No results for input(s): GLUCAP in the last 168 hours. Lipid Profile: No results for input(s): CHOL, HDL, LDLCALC, TRIG, CHOLHDL, LDLDIRECT in the last 72 hours. Thyroid Function Tests: No results for input(s): TSH, T4TOTAL, FREET4, T3FREE, THYROIDAB in the last 72 hours. Anemia Panel: No results for input(s): VITAMINB12, FOLATE, FERRITIN, TIBC, IRON, RETICCTPCT in the last 72 hours. Sepsis Labs: No results for input(s): PROCALCITON, LATICACIDVEN in the last 168 hours.  No results found for this or any previous visit (from the past 240 hour(s)).       Radiology Studies: Mr Lumbar Spine Wo Contrast  Result Date: 08/06/2017 CLINICAL DATA:  Left groin and buttock pain since falling 3 days ago. Inability to ambulate. History of hepatitis and migraine headaches. EXAM: MRI LUMBAR SPINE WITHOUT CONTRAST TECHNIQUE: Multiplanar, multisequence MR imaging of the lumbar spine was performed. No intravenous contrast was administered. COMPARISON:  Lumbar spine radiographs 08/09/2016 and 04/10/2013 (no report). FINDINGS: Segmentation: Conventional anatomy assumed, with the last open disc space designated L5-S1.This is concordant with prior radiographs. Alignment: Moderate convex left scoliosis centered at L2-3. The lateral alignment is near anatomic. Vertebrae: No evidence of acute fracture or worrisome osseous lesion in the lumbar spine. There are scattered endplate degenerative changes. There are prominent facet degenerative changes inferiorly. The left L5 pars interarticularis is not well visualized and there may be a unilateral pars defect. The left 11th rib demonstrates marrow edema and surrounding soft tissue edema, best seen on the sagittal inversion recovery images (images 15 through 18/10). This is not included on the axial images, but likely  represents a fracture. The visualized sacroiliac joints appear unremarkable. Conus medullaris: Extends to the L1 level and appears normal. Paraspinal and other soft tissues: No significant paraspinal findings. Disc levels: Sagittal images demonstrate mild disc bulging and endplate degenerative changes at T11-12 and T12-L1. No resulting spinal stenosis or nerve root encroachment. L1-2: Minimal disc bulging and facet hypertrophy. No spinal stenosis or nerve root encroachment. L2-3: Mild disc bulging with moderate facet and ligamentous hypertrophy. Borderline spinal stenosis. No nerve root encroachment. L3-4: Mild disc bulging. Moderate facet and ligamentous hypertrophy. These factors contribute to mild spinal stenosis and mild narrowing of the right lateral recess. No foraminal compromise or definite nerve root encroachment. L4-5: Mild disc bulging with right foraminal annular rent. There is moderate facet and ligamentous hypertrophy. These factors contribute to mild spinal stenosis and mild narrowing of the lateral recesses. There is also mild narrowing of the superior left foramen without definite nerve root encroachment. L5-S1: Mild disc bulging with moderate to advanced bilateral facet hypertrophy. Mild left foraminal narrowing. IMPRESSION: 1. Moderate convex left scoliosis. Possible unilateral left-sided  pars defect at L5-S1. 2. No high-grade spinal stenosis or definite nerve root encroachment identified. There is mild left-sided foraminal narrowing at L4-5 and L5-S1. There is mild multifactorial spinal stenosis at L3-4 and L4-5. 3. Probable fracture of the left 11th rib. Plain film correlation recommended. Electronically Signed   By: Carey BullocksWilliam  Veazey M.D.   On: 08/06/2017 14:29   Ct Hip Left Wo Contrast  Result Date: 08/05/2017 CLINICAL DATA:  Left groin pain with limited weight-bearing since falling 2 days ago. EXAM: CT OF THE LEFT HIP WITHOUT CONTRAST TECHNIQUE: Multidetector CT imaging of the left hip was  performed according to the standard protocol. Multiplanar CT image reconstructions were also generated. COMPARISON:  Left hip radiographs 08/04/2017. FINDINGS: Bones/Joint/Cartilage Examination is limited to the left hip and inferior left hemipelvis. Image quality is suboptimal, in part secondary to body habitus. No acute fracture or dislocation is demonstrated. There are mild left hip degenerative changes with osteophytes of the femoral neck and subchondral cyst formation anteriorly in the acetabulum. No definite abnormality of the left superior pubic ramus as questioned on prior radiographs. No significant hip joint effusion. The sacroiliac joints are not imaged. Ligaments Not relevant for exam/indication. Muscles and Tendons Unremarkable. Soft tissues No focal hematoma or other fluid collection demonstrated. IMPRESSION: 1. No evidence of acute fracture or dislocation at the left hip. Previously questioned fracture of the left superior pubic ramus demonstrates no definite CT correlate. Image quality is degraded by body habitus. 2. Left hip degenerative changes.  No significant joint effusion. 3. No periarticular hematoma. Electronically Signed   By: Carey BullocksWilliam  Veazey M.D.   On: 08/05/2017 17:25   Mr Hip Left Wo Contrast  Result Date: 08/06/2017 CLINICAL DATA:  Left hip and groin pain.  Fracture suspected. EXAM: MR OF THE LEFT HIP WITHOUT CONTRAST TECHNIQUE: Multiplanar, multisequence MR imaging was performed. No intravenous contrast was administered. COMPARISON:  Radiographs 08/04/2017.  CT 08/05/2017. FINDINGS: Bones: There is prominent bone marrow edema medially in the left superior pubic ramus, corresponding with the originally questioned nondisplaced fracture. Fracture line is apparent on the coronal images. There is also a nondisplaced fracture of the left inferior pubic ramus. No evidence of proximal femur fracture or dislocation. Coronal images include the entire pelvis and demonstrate no right pelvic  fracture or diastasis of the symphysis pubis or sacroiliac joints. Articular cartilage and labrum Articular cartilage: Mild left hip degenerative changes with subchondral cyst formation anteriorly in the acetabulum. Labrum: There is no gross labral tear or paralabral abnormality. Joint or bursal effusion Joint effusion: No significant hip joint effusion. Bursae: No focal periarticular fluid collection. Muscles and tendons Muscles and tendons: There is prominent edema throughout the left hip adductor musculature adjacent to the pubic rami fractures. There is no focal hematoma. Edema extends into the perivesical fat, but there is no focal fluid collection or ascites to suggest bladder injury. There is mild gluteus tendinosis bilaterally. The common hamstring and iliopsoas tendons appear intact. Other findings Miscellaneous: The visualized internal pelvic contents otherwise appear unremarkable. IMPRESSION: 1. MRI confirms the presence of nondisplaced acute fractures of the left superior and inferior pubic rami. There is associated marrow and surrounding soft tissue edema. These fractures are occult on CT. 2. No evidence of proximal left femur fracture or dislocation. Mild left hip degenerative changes. Electronically Signed   By: Carey BullocksWilliam  Veazey M.D.   On: 08/06/2017 13:54        Scheduled Meds: . desvenlafaxine  50 mg Oral Daily  . enoxaparin (LOVENOX)  injection  40 mg Subcutaneous Daily  . FLUoxetine  20 mg Oral Daily  . gabapentin  600 mg Oral TID  . levothyroxine  25 mcg Oral QAC breakfast  . sodium chloride flush  3 mL Intravenous Q12H   Continuous Infusions: . sodium chloride       LOS: 0 days    Time spent: 35 minutes.     Alba Cory, MD [facility] Pager [phone]  If 7PM-7AM, please contact night-coverage [email or web address] Password [facility] 08/06/2017, 5:33 PM

## 2017-08-07 ENCOUNTER — Observation Stay (HOSPITAL_COMMUNITY): Payer: 59

## 2017-08-07 DIAGNOSIS — R1032 Left lower quadrant pain: Secondary | ICD-10-CM | POA: Diagnosis not present

## 2017-08-07 MED ORDER — HYDROCODONE-IBUPROFEN 7.5-200 MG PO TABS: 0.5000 | ORAL_TABLET | Freq: Four times a day (QID) | ORAL | 0 refills | Status: DC | PRN

## 2017-08-07 MED ORDER — METHOCARBAMOL 500 MG PO TABS: 500.0000 mg | ORAL_TABLET | Freq: Four times a day (QID) | ORAL | 0 refills | Status: DC | PRN

## 2017-08-07 MED ORDER — SUMATRIPTAN SUCCINATE 25 MG PO TABS
25.0000 mg | ORAL_TABLET | ORAL | Status: DC | PRN
  Administered 2017-08-07 – 2017-08-10 (×5): 25 mg via ORAL
  Filled 2017-08-07 (×7): qty 1

## 2017-08-07 NOTE — Progress Notes (Signed)
PROGRESS NOTE    Porscha Axley  ZOX:[id number] DOB: 1955-07-12 DOA: 08/05/2017 PCP: Madelin Headings, MD   Brief Narrative: Isaly Fasching  is a 62 y.o. female, w Hepatitis C, Depression, Migraines, Cervicalgia, who presents with c/o left groin pain since fall on Thursday.  Pt was evaluated in ER 8/2, and then again earlier on 8/3.   In ED,; CT left hip  IMPRESSION: 1. No evidence of acute fracture or dislocation at the left hip. Previously questioned fracture of the left superior pubic ramus demonstrates no definite CT correlate. Image quality is degraded by body habitus. 2. Left hip degenerative changes. No significant joint effusion. 3. No periarticular hematoma.  Wbc 6.2, Hgb 12.5, Plt 189 Na 140, K 3.9, Bun 14, Creatinine 0.74  Asked by ER to evaluate the patient for left groin pain   Assessment & Plan:   Principal Problem:   Left groin pain Active Problems:   Generalized anxiety disorder   1-Left groin, leg pain. Secondary to left superior and inferior pubic rami fracture.  CT hip negative for fracture.  MRI confirms the presence of nondisplaced acute fractures of the left superior and inferior pubic rami. Continue with pain management.  PT evaluation.  MRI lumbar spine; spondylosis.    2-Depression;  Continue with meds  3-Migraine; continue with Imitrex.   4-Multiples Ribs fracture ;  There are mildly displaced fractures of the anterolateral aspects of the left eighth, ninth, and tenth ribs.  Incentive spirometry.  Pain management.    DVT prophylaxis: lovenox Code Status: full code.  Family Communication: care discussed with patient.  Disposition Plan: remain in the hospital for pain controlled. She is still requiring IV dilaudid for pain controlled.    Consultants:  none  Procedures:   none   Antimicrobials; none   Subjective: She is still complaining of groin pain.  She is also complaining of migraine headaches.  She  lives by herself. She lives with her grandson. We need work of safe discharge plan for patient.   Objective: Vitals:   08/07/17 0109 08/07/17 0340 08/07/17 0433 08/07/17 0726  BP: 133/70  113/67 (!) 143/73  Pulse: 68  70 70  Resp: 18  15 16   Temp: 98.2 F (36.8 C)  98 F (36.7 C) 98.2 F (36.8 C)  TempSrc: Oral  Oral Oral  SpO2:   92% 92%  Weight:  95.3 kg (210 lb 1.6 oz)    Height:        Intake/Output Summary (Last 24 hours) at 08/07/2017 1622 Last data filed at 08/07/2017 1001 Gross per 24 hour  Intake 3 ml  Output -  Net 3 ml   Filed Weights   08/05/17 1323 08/05/17 2340 08/07/17 0340  Weight: 93.4 kg (206 lb) 95.3 kg (210 lb 1.6 oz) 95.3 kg (210 lb 1.6 oz)    Examination:  General exam: NAD Respiratory system: CTA Cardiovascular system: S 1, S 2 RRR Gastrointestinal system: Abdomen is nondistended, soft and nontender. No organomegaly or masses felt. Normal bowel sounds heard. Central nervous system: Alert and oriented.  Extremities: unable to move right leg due to severe pain.  Skin: No rashes, lesions or ulcers   Data Reviewed: I have personally reviewed following labs and imaging studies  CBC: Recent Labs  Lab 08/05/17 1941 08/06/17 0541  WBC 6.2 5.4  HGB 12.5 11.5*  HCT 41.3 37.6  MCV 91.6 89.5  PLT 189 187   Basic Metabolic Panel: Recent Labs  Lab 08/05/17 1941 08/06/17 0541  NA 140 140  K 3.9 3.7  CL 102 105  CO2 30 27  GLUCOSE 98 98  BUN 14 17  CREATININE 0.74 0.78  CALCIUM 9.0 8.7*   GFR: Estimated Creatinine Clearance: 79.1 mL/min (by C-G formula based on SCr of 0.78 mg/dL). Liver Function Tests: Recent Labs  Lab 08/06/17 0541  AST 19  ALT 20  ALKPHOS 68  BILITOT 0.4  PROT 6.3*  ALBUMIN 3.2*   No results for input(s): LIPASE, AMYLASE in the last 168 hours. No results for input(s): AMMONIA in the last 168 hours. Coagulation Profile: No results for input(s): INR, PROTIME in the last 168 hours. Cardiac Enzymes: No results for  input(s): CKTOTAL, CKMB, CKMBINDEX, TROPONINI in the last 168 hours. BNP (last 3 results) No results for input(s): PROBNP in the last 8760 hours. HbA1C: No results for input(s): HGBA1C in the last 72 hours. CBG: No results for input(s): GLUCAP in the last 168 hours. Lipid Profile: No results for input(s): CHOL, HDL, LDLCALC, TRIG, CHOLHDL, LDLDIRECT in the last 72 hours. Thyroid Function Tests: No results for input(s): TSH, T4TOTAL, FREET4, T3FREE, THYROIDAB in the last 72 hours. Anemia Panel: No results for input(s): VITAMINB12, FOLATE, FERRITIN, TIBC, IRON, RETICCTPCT in the last 72 hours. Sepsis Labs: No results for input(s): PROCALCITON, LATICACIDVEN in the last 168 hours.  No results found for this or any previous visit (from the past 240 hour(s)).       Radiology Studies: Dg Ribs Unilateral Left  Result Date: 08/07/2017 CLINICAL DATA:  Recent fall with pelvic fracture. Possible left ribcage injury. EXAM: LEFT RIBS - 2 VIEW COMPARISON:  Chest x-ray of January 26, 2016 FINDINGS: The lungs are adequately inflated. There is no focal infiltrate. There is no pleural effusion or pneumothorax. The heart is normal in size. The pulmonary vascularity is not engorged. There is minimal linear increased density projecting just above the left lateral costophrenic angle. There are fractures of the lateral aspects of the left eighth, ninth, and tenth ribs. IMPRESSION: There are mildly displaced fractures of the anterolateral aspects of the left eighth, ninth, and tenth ribs. There is no pleural effusion or pneumothorax but minimal subsegmental atelectasis just above the left lateral costophrenic angle is observed. Otherwise no acute cardiopulmonary abnormality. Electronically Signed   By: David  [place] M.D.   On: 08/07/2017 13:46   Mr Lumbar Spine Wo Contrast  Result Date: 08/06/2017 CLINICAL DATA:  Left groin and buttock pain since falling 3 days ago. Inability to ambulate. History of hepatitis and  migraine headaches. EXAM: MRI LUMBAR SPINE WITHOUT CONTRAST TECHNIQUE: Multiplanar, multisequence MR imaging of the lumbar spine was performed. No intravenous contrast was administered. COMPARISON:  Lumbar spine radiographs 08/09/2016 and 04/10/2013 (no report). FINDINGS: Segmentation: Conventional anatomy assumed, with the last open disc space designated L5-S1.This is concordant with prior radiographs. Alignment: Moderate convex left scoliosis centered at L2-3. The lateral alignment is near anatomic. Vertebrae: No evidence of acute fracture or worrisome osseous lesion in the lumbar spine. There are scattered endplate degenerative changes. There are prominent facet degenerative changes inferiorly. The left L5 pars interarticularis is not well visualized and there may be a unilateral pars defect. The left 11th rib demonstrates marrow edema and surrounding soft tissue edema, best seen on the sagittal inversion recovery images (images 15 through 18/10). This is not included on the axial images, but likely represents a fracture. The visualized sacroiliac joints appear unremarkable. Conus medullaris: Extends to the L1 level and appears normal. Paraspinal and other soft tissues:  No significant paraspinal findings. Disc levels: Sagittal images demonstrate mild disc bulging and endplate degenerative changes at T11-12 and T12-L1. No resulting spinal stenosis or nerve root encroachment. L1-2: Minimal disc bulging and facet hypertrophy. No spinal stenosis or nerve root encroachment. L2-3: Mild disc bulging with moderate facet and ligamentous hypertrophy. Borderline spinal stenosis. No nerve root encroachment. L3-4: Mild disc bulging. Moderate facet and ligamentous hypertrophy. These factors contribute to mild spinal stenosis and mild narrowing of the right lateral recess. No foraminal compromise or definite nerve root encroachment. L4-5: Mild disc bulging with right foraminal annular rent. There is moderate facet and  ligamentous hypertrophy. These factors contribute to mild spinal stenosis and mild narrowing of the lateral recesses. There is also mild narrowing of the superior left foramen without definite nerve root encroachment. L5-S1: Mild disc bulging with moderate to advanced bilateral facet hypertrophy. Mild left foraminal narrowing. IMPRESSION: 1. Moderate convex left scoliosis. Possible unilateral left-sided pars defect at L5-S1. 2. No high-grade spinal stenosis or definite nerve root encroachment identified. There is mild left-sided foraminal narrowing at L4-5 and L5-S1. There is mild multifactorial spinal stenosis at L3-4 and L4-5. 3. Probable fracture of the left 11th rib. Plain film correlation recommended. Electronically Signed   By: Carey BullocksWilliam  Veazey M.D.   On: 08/06/2017 14:29   Ct Hip Left Wo Contrast  Result Date: 08/05/2017 CLINICAL DATA:  Left groin pain with limited weight-bearing since falling 2 days ago. EXAM: CT OF THE LEFT HIP WITHOUT CONTRAST TECHNIQUE: Multidetector CT imaging of the left hip was performed according to the standard protocol. Multiplanar CT image reconstructions were also generated. COMPARISON:  Left hip radiographs 08/04/2017. FINDINGS: Bones/Joint/Cartilage Examination is limited to the left hip and inferior left hemipelvis. Image quality is suboptimal, in part secondary to body habitus. No acute fracture or dislocation is demonstrated. There are mild left hip degenerative changes with osteophytes of the femoral neck and subchondral cyst formation anteriorly in the acetabulum. No definite abnormality of the left superior pubic ramus as questioned on prior radiographs. No significant hip joint effusion. The sacroiliac joints are not imaged. Ligaments Not relevant for exam/indication. Muscles and Tendons Unremarkable. Soft tissues No focal hematoma or other fluid collection demonstrated. IMPRESSION: 1. No evidence of acute fracture or dislocation at the left hip. Previously questioned  fracture of the left superior pubic ramus demonstrates no definite CT correlate. Image quality is degraded by body habitus. 2. Left hip degenerative changes.  No significant joint effusion. 3. No periarticular hematoma. Electronically Signed   By: Carey BullocksWilliam  Veazey M.D.   On: 08/05/2017 17:25   Mr Hip Left Wo Contrast  Result Date: 08/06/2017 CLINICAL DATA:  Left hip and groin pain.  Fracture suspected. EXAM: MR OF THE LEFT HIP WITHOUT CONTRAST TECHNIQUE: Multiplanar, multisequence MR imaging was performed. No intravenous contrast was administered. COMPARISON:  Radiographs 08/04/2017.  CT 08/05/2017. FINDINGS: Bones: There is prominent bone marrow edema medially in the left superior pubic ramus, corresponding with the originally questioned nondisplaced fracture. Fracture line is apparent on the coronal images. There is also a nondisplaced fracture of the left inferior pubic ramus. No evidence of proximal femur fracture or dislocation. Coronal images include the entire pelvis and demonstrate no right pelvic fracture or diastasis of the symphysis pubis or sacroiliac joints. Articular cartilage and labrum Articular cartilage: Mild left hip degenerative changes with subchondral cyst formation anteriorly in the acetabulum. Labrum: There is no gross labral tear or paralabral abnormality. Joint or bursal effusion Joint effusion: No significant hip joint  effusion. Bursae: No focal periarticular fluid collection. Muscles and tendons Muscles and tendons: There is prominent edema throughout the left hip adductor musculature adjacent to the pubic rami fractures. There is no focal hematoma. Edema extends into the perivesical fat, but there is no focal fluid collection or ascites to suggest bladder injury. There is mild gluteus tendinosis bilaterally. The common hamstring and iliopsoas tendons appear intact. Other findings Miscellaneous: The visualized internal pelvic contents otherwise appear unremarkable. IMPRESSION: 1. MRI  confirms the presence of nondisplaced acute fractures of the left superior and inferior pubic rami. There is associated marrow and surrounding soft tissue edema. These fractures are occult on CT. 2. No evidence of proximal left femur fracture or dislocation. Mild left hip degenerative changes. Electronically Signed   By: Carey Bullocks M.D.   On: 08/06/2017 13:54        Scheduled Meds: . desvenlafaxine  50 mg Oral Daily  . enoxaparin (LOVENOX) injection  40 mg Subcutaneous Daily  . FLUoxetine  20 mg Oral Daily  . gabapentin  600 mg Oral TID  . levothyroxine  25 mcg Oral QAC breakfast  . sodium chloride flush  3 mL Intravenous Q12H   Continuous Infusions: . sodium chloride       LOS: 0 days    Time spent: 35 minutes.     Alba Cory, MD [facility] Pager [phone]  If 7PM-7AM, please contact night-coverage [email or web address] Password [email or web address] 08/07/2017, 4:22 PM

## 2017-08-07 NOTE — Consult Note (Signed)
Reason for Consult:Pelvic fxs Referring Physician: B Regalado  Toni [name] is an 62 y.o. female.  HPI: Toni [name] was trying to pick up her grandson last Thursday and became unbalanced and fell forwards. She is unsure how she landed or what she hit. She had immediate left groin/hip pain. She went to [facility] but was discharged as x-rays were negative. She continued to struggle at home and family brought her to [facility] for another evaluation. CT was also negative and MRI was ordered. This showed left sup/inf pubic rami fxs with adductor strain and orthopedic surgery was consulted.  Past Medical History:  Diagnosis Date  . Acute hepatitis C without mention of hepatic coma(070.51)    No treatment  . Brachial neuritis or radiculitis NOS   . Depressive disorder, not elsewhere classified   . Essential and other specified forms of tremor   . Headache(784.0)   . Memory loss   . Microscopic hematuria    seed dr Gaynelle Arabian in past  . Migraine, unspecified, without mention of intractable migraine without mention of status migrainosus   . Other specified disorders of rotator cuff syndrome of shoulder and allied disorders   . Vulvar pain    rx with valium supp    Past Surgical History:  Procedure Laterality Date  . BREAST EXCISIONAL BIOPSY Right    No scar  . CESAREAN SECTION     x3  . SHOULDER ARTHROSCOPY W/ ROTATOR CUFF REPAIR Right 03/13/2002  . SHOULDER ARTHROSCOPY W/ ROTATOR CUFF REPAIR Left 03/13/2006    Family History  Problem Relation Age of Onset  . Headache Mother   . Stroke Father        father passed away Jul 06, 2022  . Diabetes Maternal Grandmother   . Other Son        Mother is deceased 03/14/11 father moved to the area 03/13/12.    Social History:  reports that she quit smoking about 39 years ago. Her smoking use included cigarettes. She has never used smokeless tobacco. She reports that she does not drink alcohol or use drugs.  Allergies:  Allergies  Allergen Reactions  . Codeine Nausea  And Vomiting and Other (See Comments)    Shuts down GI tract    Medications: I have reviewed the patient's current medications.  Results for orders placed or performed during the hospital encounter of 08/05/17 (from the past 48 hour(s))  CBC     Status: None   Collection Time: 08/05/17  7:41 PM  Result Value Ref Range   WBC 6.2 4.0 - 10.5 K/uL   RBC 4.51 3.87 - 5.11 MIL/uL   Hemoglobin 12.5 12.0 - 15.0 g/dL   HCT 41.3 36.0 - 46.0 %   MCV 91.6 78.0 - 100.0 fL   MCH 27.7 26.0 - 34.0 pg   MCHC 30.3 30.0 - 36.0 g/dL   RDW 12.9 11.5 - 15.5 %   Platelets 189 150 - 400 K/uL    Comment: Performed at [facility], [place] [address] [place], [place] [address]  Basic metabolic panel     Status: None   Collection Time: 08/05/17  7:41 PM  Result Value Ref Range   Sodium 140 135 - 145 mmol/L   Potassium 3.9 3.5 - 5.1 mmol/L   Chloride 102 98 - 111 mmol/L   CO2 30 22 - 32 mmol/L   Glucose, Bld 98 70 - 99 mg/dL   BUN 14 8 - 23 mg/dL   Creatinine, Ser 0.74 0.44 - 1.00 mg/dL  Calcium 9.0 8.9 - 10.3 mg/dL   GFR calc non Af Amer >60 >60 mL/min   GFR calc Af Amer >60 >60 mL/min    Comment: (NOTE) The eGFR has been calculated using the CKD EPI equation. This calculation has not been validated in all clinical situations. eGFR's persistently <60 mL/min signify possible Chronic Kidney Disease.    Anion gap 8 5 - 15    Comment: Performed at [facility] [address], [place], [place] [address]  HIV antibody (Routine Testing)     Status: None   Collection Time: 08/06/17  5:41 AM  Result Value Ref Range   HIV Screen 4th Generation wRfx Non Reactive Non Reactive    Comment: (NOTE) Performed At: [facility] [place], [place] [id number] Rush Farmer MD YB:[id number]   Comprehensive metabolic panel     Status: Abnormal   Collection Time: 08/06/17  5:41 AM  Result Value Ref Range   Sodium 140 135 - 145 mmol/L   Potassium 3.7 3.5 - 5.1 mmol/L   Chloride 105  98 - 111 mmol/L   CO2 27 22 - 32 mmol/L   Glucose, Bld 98 70 - 99 mg/dL   BUN 17 8 - 23 mg/dL   Creatinine, Ser 0.78 0.44 - 1.00 mg/dL   Calcium 8.7 (L) 8.9 - 10.3 mg/dL   Total Protein 6.3 (L) 6.5 - 8.1 g/dL   Albumin 3.2 (L) 3.5 - 5.0 g/dL   AST 19 15 - 41 U/L   ALT 20 0 - 44 U/L   Alkaline Phosphatase 68 38 - 126 U/L   Total Bilirubin 0.4 0.3 - 1.2 mg/dL   GFR calc non Af Amer >60 >60 mL/min   GFR calc Af Amer >60 >60 mL/min    Comment: (NOTE) The eGFR has been calculated using the CKD EPI equation. This calculation has not been validated in all clinical situations. eGFR's persistently <60 mL/min signify possible Chronic Kidney Disease.    Anion gap 8 5 - 15    Comment: Performed at [facility] [address], [place], [place] [address]  CBC     Status: Abnormal   Collection Time: 08/06/17  5:41 AM  Result Value Ref Range   WBC 5.4 4.0 - 10.5 K/uL   RBC 4.20 3.87 - 5.11 MIL/uL   Hemoglobin 11.5 (L) 12.0 - 15.0 g/dL   HCT 37.6 36.0 - 46.0 %   MCV 89.5 78.0 - 100.0 fL   MCH 27.4 26.0 - 34.0 pg   MCHC 30.6 30.0 - 36.0 g/dL   RDW 13.0 11.5 - 15.5 %   Platelets 187 150 - 400 K/uL    Comment: Performed at [facility], [place] [address], [place], [place] [address]    Mr Lumbar Spine Wo Contrast  Result Date: 08/06/2017 CLINICAL DATA:  Left groin and buttock pain since falling 3 days ago. Inability to ambulate. History of hepatitis and migraine headaches. EXAM: MRI LUMBAR SPINE WITHOUT CONTRAST TECHNIQUE: Multiplanar, multisequence MR imaging of the lumbar spine was performed. No intravenous contrast was administered. COMPARISON:  Lumbar spine radiographs 08/09/2016 and 04/10/2013 (no report). FINDINGS: Segmentation: Conventional anatomy assumed, with the last open disc space designated L5-S1.This is concordant with prior radiographs. Alignment: Moderate convex left scoliosis centered at L2-3. The lateral alignment is near anatomic. Vertebrae: No evidence of acute  fracture or worrisome osseous lesion in the lumbar spine. There are scattered endplate degenerative changes. There are prominent facet degenerative changes  inferiorly. The left L5 pars interarticularis is not well visualized and there may be a unilateral pars defect. The left 11th rib demonstrates marrow edema and surrounding soft tissue edema, best seen on the sagittal inversion recovery images (images 15 through 18/10). This is not included on the axial images, but likely represents a fracture. The visualized sacroiliac joints appear unremarkable. Conus medullaris: Extends to the L1 level and appears normal. Paraspinal and other soft tissues: No significant paraspinal findings. Disc levels: Sagittal images demonstrate mild disc bulging and endplate degenerative changes at T11-12 and T12-L1. No resulting spinal stenosis or nerve root encroachment. L1-2: Minimal disc bulging and facet hypertrophy. No spinal stenosis or nerve root encroachment. L2-3: Mild disc bulging with moderate facet and ligamentous hypertrophy. Borderline spinal stenosis. No nerve root encroachment. L3-4: Mild disc bulging. Moderate facet and ligamentous hypertrophy. These factors contribute to mild spinal stenosis and mild narrowing of the right lateral recess. No foraminal compromise or definite nerve root encroachment. L4-5: Mild disc bulging with right foraminal annular rent. There is moderate facet and ligamentous hypertrophy. These factors contribute to mild spinal stenosis and mild narrowing of the lateral recesses. There is also mild narrowing of the superior left foramen without definite nerve root encroachment. L5-S1: Mild disc bulging with moderate to advanced bilateral facet hypertrophy. Mild left foraminal narrowing. IMPRESSION: 1. Moderate convex left scoliosis. Possible unilateral left-sided pars defect at L5-S1. 2. No high-grade spinal stenosis or definite nerve root encroachment identified. There is mild left-sided foraminal  narrowing at L4-5 and L5-S1. There is mild multifactorial spinal stenosis at L3-4 and L4-5. 3. Probable fracture of the left 11th rib. Plain film correlation recommended. Electronically Signed   By: Richardean Sale M.D.   On: 08/06/2017 14:29   Ct Hip Left Wo Contrast  Result Date: 08/05/2017 CLINICAL DATA:  Left groin pain with limited weight-bearing since falling 2 days ago. EXAM: CT OF THE LEFT HIP WITHOUT CONTRAST TECHNIQUE: Multidetector CT imaging of the left hip was performed according to the standard protocol. Multiplanar CT image reconstructions were also generated. COMPARISON:  Left hip radiographs 08/04/2017. FINDINGS: Bones/Joint/Cartilage Examination is limited to the left hip and inferior left hemipelvis. Image quality is suboptimal, in part secondary to body habitus. No acute fracture or dislocation is demonstrated. There are mild left hip degenerative changes with osteophytes of the femoral neck and subchondral cyst formation anteriorly in the acetabulum. No definite abnormality of the left superior pubic ramus as questioned on prior radiographs. No significant hip joint effusion. The sacroiliac joints are not imaged. Ligaments Not relevant for exam/indication. Muscles and Tendons Unremarkable. Soft tissues No focal hematoma or other fluid collection demonstrated. IMPRESSION: 1. No evidence of acute fracture or dislocation at the left hip. Previously questioned fracture of the left superior pubic ramus demonstrates no definite CT correlate. Image quality is degraded by body habitus. 2. Left hip degenerative changes.  No significant joint effusion. 3. No periarticular hematoma. Electronically Signed   By: Richardean Sale M.D.   On: 08/05/2017 17:25   Mr Hip Left Wo Contrast  Result Date: 08/06/2017 CLINICAL DATA:  Left hip and groin pain.  Fracture suspected. EXAM: MR OF THE LEFT HIP WITHOUT CONTRAST TECHNIQUE: Multiplanar, multisequence MR imaging was performed. No intravenous contrast was  administered. COMPARISON:  Radiographs 08/04/2017.  CT 08/05/2017. FINDINGS: Bones: There is prominent bone marrow edema medially in the left superior pubic ramus, corresponding with the originally questioned nondisplaced fracture. Fracture line is apparent on the coronal images. There is also a  nondisplaced fracture of the left inferior pubic ramus. No evidence of proximal femur fracture or dislocation. Coronal images include the entire pelvis and demonstrate no right pelvic fracture or diastasis of the symphysis pubis or sacroiliac joints. Articular cartilage and labrum Articular cartilage: Mild left hip degenerative changes with subchondral cyst formation anteriorly in the acetabulum. Labrum: There is no gross labral tear or paralabral abnormality. Joint or bursal effusion Joint effusion: No significant hip joint effusion. Bursae: No focal periarticular fluid collection. Muscles and tendons Muscles and tendons: There is prominent edema throughout the left hip adductor musculature adjacent to the pubic rami fractures. There is no focal hematoma. Edema extends into the perivesical fat, but there is no focal fluid collection or ascites to suggest bladder injury. There is mild gluteus tendinosis bilaterally. The common hamstring and iliopsoas tendons appear intact. Other findings Miscellaneous: The visualized internal pelvic contents otherwise appear unremarkable. IMPRESSION: 1. MRI confirms the presence of nondisplaced acute fractures of the left superior and inferior pubic rami. There is associated marrow and surrounding soft tissue edema. These fractures are occult on CT. 2. No evidence of proximal left femur fracture or dislocation. Mild left hip degenerative changes. Electronically Signed   By: Richardean Sale M.D.   On: 08/06/2017 13:54    Review of Systems  Constitutional: Negative for weight loss.  HENT: Negative for ear discharge, ear pain, hearing loss and tinnitus.   Eyes: Negative for blurred  vision, double vision, photophobia and pain.  Respiratory: Negative for cough, sputum production and shortness of breath.   Cardiovascular: Negative for chest pain.  Gastrointestinal: Negative for abdominal pain, nausea and vomiting.  Genitourinary: Negative for dysuria, flank pain, frequency and urgency.  Musculoskeletal: Positive for joint pain (Left groin). Negative for back pain, falls, myalgias and neck pain.  Neurological: Negative for dizziness, tingling, sensory change, focal weakness, loss of consciousness and headaches.  Endo/Heme/Allergies: Does not bruise/bleed easily.  Psychiatric/Behavioral: Negative for depression, memory loss and substance abuse. The patient is not nervous/anxious.    Blood pressure (!) 143/73, pulse 70, temperature 98.2 F (36.8 C), temperature source Oral, resp. rate 16, height 5' 3"  (1.6 m), weight 95.3 kg (210 lb 1.6 oz), SpO2 92 %. Physical Exam  Constitutional: She appears well-developed and well-nourished. No distress.  HENT:  Head: Normocephalic and atraumatic.  Eyes: Conjunctivae are normal. Right eye exhibits no discharge. Left eye exhibits no discharge. No scleral icterus.  Neck: Normal range of motion.  Cardiovascular: Normal rate and regular rhythm.  Respiratory: Effort normal. No respiratory distress.  Musculoskeletal:  LLE No traumatic wounds, ecchymosis, or rash  TTP groin, severe pain with AROM/PROM  No knee or ankle effusion  Knee stable to varus/ valgus and anterior/posterior stress  Sens DPN, SPN, TN intact  Motor EHL, ext, flex, evers 5/5  DP 2+, PT 1+, 1+ NP edema  Neurological: She is alert.  Skin: Skin is warm and dry. She is not diaphoretic.  Psychiatric: She has a normal mood and affect. Her behavior is normal.    Assessment/Plan: Fall Left sup/inf pubic rami fxs -- Should heal well with non-operative management. She may WBAT on LLE. Consider increasing Robaxin to 1065m and scheduling it to help with symptom relief. She  may f/u with established orthopedics at [facility] or with Dr. MPercell Milleras needed but doubt she'll need formal f/u. Multiple medical problems    MLisette Abu PA-C Orthopedic Surgery [phone]/05/2017, 12:08 PM

## 2017-08-07 NOTE — Progress Notes (Signed)
CM asked per MD to meet with patient about safe d/c home vs needing rehab. CM met with the patient to go over assistance at home and rehab options. Pt states she is unable to make this decision without knowing what meds she will d/c on and without knowing what her insurance will cover. CM explained to her that she would need to pick a facility before we would know what her insurance will cover. She then states she doesn't want to give a facility the power over her medications without a say in how and what she takes.  CM inquired about assistance at home. She states she has a support circle but then states she would have to see if the [employer] would be able to assist. She then states she will discuss the options with her family and let CM know.  CM updated MD that patient is not willing to make any d/c decisions without knowing what her d/c meds will be so she can let her Neurosurgery office know. CM following.

## 2017-08-08 DIAGNOSIS — R1032 Left lower quadrant pain: Secondary | ICD-10-CM | POA: Diagnosis not present

## 2017-08-08 LAB — CBC
HEMATOCRIT: 39.3 % (ref 36.0–46.0)
HEMOGLOBIN: 12.3 g/dL (ref 12.0–15.0)
MCH: 28 pg (ref 26.0–34.0)
MCHC: 31.3 g/dL (ref 30.0–36.0)
MCV: 89.5 fL (ref 78.0–100.0)
Platelets: 206 10*3/uL (ref 150–400)
RBC: 4.39 MIL/uL (ref 3.87–5.11)
RDW: 12.7 % (ref 11.5–15.5)
WBC: 6.3 10*3/uL (ref 4.0–10.5)

## 2017-08-08 MED ORDER — LORATADINE 10 MG PO TABS
10.0000 mg | ORAL_TABLET | Freq: Every day | ORAL | Status: DC
  Administered 2017-08-09 – 2017-08-10 (×2): 10 mg via ORAL
  Filled 2017-08-08 (×2): qty 1

## 2017-08-08 MED ORDER — DEXTROAMPHETAMINE SULFATE ER 5 MG PO CP24: 30.0000 mg | ORAL_CAPSULE | Freq: Every day | ORAL | Status: DC

## 2017-08-08 MED ORDER — HYDROMORPHONE HCL 1 MG/ML IJ SOLN
0.5000 mg | INTRAMUSCULAR | Status: DC | PRN
  Administered 2017-08-08 (×2): 0.5 mg via INTRAVENOUS
  Administered 2017-08-09: 1 mg via INTRAVENOUS
  Administered 2017-08-09: 0.5 mg via INTRAVENOUS
  Administered 2017-08-09: 1 mg via INTRAVENOUS
  Administered 2017-08-10 (×2): 0.5 mg via INTRAVENOUS
  Filled 2017-08-08: qty 1
  Filled 2017-08-08 (×5): qty 0.5
  Filled 2017-08-08: qty 1

## 2017-08-08 MED ORDER — OXYCODONE HCL 5 MG PO TABS
5.0000 mg | ORAL_TABLET | Freq: Four times a day (QID) | ORAL | Status: DC | PRN
Start: 2017-08-08 — End: 2017-08-10
  Administered 2017-08-08 – 2017-08-10 (×4): 10 mg via ORAL
  Filled 2017-08-08 (×4): qty 2

## 2017-08-08 MED ORDER — POLYETHYLENE GLYCOL 3350 17 G PO PACK
17.0000 g | PACK | Freq: Every day | ORAL | Status: DC
  Administered 2017-08-08 – 2017-08-10 (×3): 17 g via ORAL
  Filled 2017-08-08 (×3): qty 1

## 2017-08-08 MED ORDER — ADULT MULTIVITAMIN W/MINERALS CH
1.0000 | ORAL_TABLET | Freq: Every day | ORAL | Status: DC
  Administered 2017-08-09 – 2017-08-10 (×2): 1 via ORAL
  Filled 2017-08-08 (×2): qty 1

## 2017-08-08 NOTE — Care Management Note (Signed)
Case Management Note  Patient Details  Name: Toni MaxcyRhonda Lynn [name] MRN: [id number] Date of Birth: 03/20/1955  Subjective/Objective:                    Action/Plan: Per patients request [facility]. [facility] and asked for patient to be reviewed for admission. CM faxed the information she requested to: [phone]. [facility] will review and inform CM and patient of their decision. CM following.  Expected Discharge Date:                  Expected Discharge Plan:  Home w Home Health Services  In-House Referral:  Clinical Social Work  Discharge planning Services  CM Consult  Post Acute Care Choice:  Home Health Choice offered to:  Patient  DME Arranged:    DME Agency:     HH Arranged:  RN, PT, OT, Nurse's Aide, Social Work [employer] Agency:     Status of Service:  In process, will continue to follow  If discussed at Long Length of Stay Meetings, dates discussed:    Additional Comments:  Kermit BaloKelli F Mollie Rossano, RN 08/08/2017, 5:12 PM

## 2017-08-08 NOTE — Progress Notes (Signed)
CM, CSW and CIR all met with patient to go over the options for d/c. CIR is not accepting the patient at this time and pt informed. Pt upset about the information.  CSW attempted to go over that her insurance may not pay for SNF rehab, but patient just asking to speak to administration. CM inquired about the support group the patient had mentioned yesterday but she states she has no assistance. CM informed the charge RN that patient would like to speak to administration.

## 2017-08-08 NOTE — Clinical Social Work Note (Signed)
Clinical Social Work Assessment  Patient Details  Name: Toni [name] MRN: [id number] Date of Birth: [birth year]  Date of referral:  08/08/17               Reason for consult:  Facility Placement                Permission sought to share information with:  Facility Art therapist granted to share information::  Yes, Verbal Permission Granted  Name::        Agency::  SNF  Relationship::     Contact Information:     Housing/Transportation Living arrangements for the past 2 months:  Single Family Home Source of Information:  Patient, Medical Team Patient Interpreter Needed:  None Criminal Activity/Legal Involvement Pertinent to Current Situation/Hospitalization:  No - Comment as needed Significant Relationships:  Dependent Children, Spouse Lives with:  Self Do you feel safe going back to the place where you live?  Yes Need for family participation in patient care:  No (Coment)  Care giving concerns:  Patient from home alone, and 24 hour supervision is being recommended for the patient. Patient indicates that she has not been able to find someone to be with her 24 hours a day. Patient requests a referral sent out for SNF placement.   Social Worker assessment / plan:  CSW met with patient along with RNCM and CIR Admissions Coordinator to discuss discharge plan. CSW attempted to explain to the patient that the recommendation is for home health, that insurance does not cover just for supervision, and patient became agitated during discussion and demanded to speak to the Administrator. CSW later alerted by Administrator that patient is agreeable to being faxed out for SNF placement and would like a facility list to review. CSW completed referral and faxed out, per patient request.  Employment status:  Retired Forensic scientist:  Managed Care PT Recommendations:  Home with [place] / Referral to community resources:  [place]  Patient/Family's Response to care:  Patient is requesting SNF placement as she does not have someone to be with her 24 hours a day.  Patient/Family's Understanding of and Emotional Response to Diagnosis, Current Treatment, and Prognosis:  Patient does not seem to understand the resources that are currently being recommended for her, and is becoming agitated and defensive whenever education is being provided to her or anyone engages in discussions about discharge planning with her. Patient refused to allow any planning progress to be made until after she spoke with hospital administration, and after speaking with administration says that she will "be ok" with looking into SNF.  Emotional Assessment Appearance:  Appears stated age Attitude/Demeanor/Rapport:  Angry, Complaining, Combative Affect (typically observed):  Agitated, Angry, Irritable Orientation:  Oriented to Place, Oriented to Self, Oriented to  Time, Oriented to Situation Alcohol / Substance use:  Not Applicable Psych involvement (Current and /or in the community):  No (Comment)  Discharge Needs  Concerns to be addressed:  Care Coordination Readmission within the last 30 days:  No Current discharge risk:  Lives alone Barriers to Discharge:  Ship broker, Programmer, applications ([place]), Other   Geralynn Ochs, LCSW 08/08/2017, 4:14 PM

## 2017-08-08 NOTE — [place] FL2 (Signed)
[place] MEDICAID FL2 LEVEL OF CARE SCREENING TOOL     IDENTIFICATION  Patient Name: Toni [name] Birthdate: 1955/02/23 Sex: female Admission Date (Current Location): 08/05/2017  [facility] and [place] Number:  Producer, television/film/video and Address:  The [place], [address] [place], [place] [address]      Provider Number: [id number]  Attending Physician Name and Address:  Alba Cory, MD  Relative Name and Phone Number:       Current Level of Care: Hospital Recommended Level of Care: Skilled Nursing Facility Prior Approval Number:    Date Approved/Denied:   PASRR Number: Manual review  Discharge Plan: SNF    Current Diagnoses: Patient Active Problem List   Diagnosis Date Noted  . Left groin pain 08/05/2017  . Occipital neuralgia of right side 10/21/2016  . Chronic hepatitis C (HCC) 10/05/2015  . Chronic migraine 01/26/2015  . Persistent headaches 12/16/2014  . Subclinical hypothyroidism 12/16/2014  . Memory loss 10/08/2014  . Depression 10/08/2014  . Generalized anxiety disorder 10/08/2014  . Tremor 10/08/2014  . Major depressive disorder, single episode, moderate (HCC) 08/27/2014  . Memory difficulties 08/27/2014  . Degenerative disc disease, cervical 08/27/2014  . Chronically on opiate therapy 08/27/2014    Orientation RESPIRATION BLADDER Height & Weight     Self, Time, Situation, Place  Normal Continent Weight: 207 lb 10.8 oz (94.2 kg) Height:  5\' 3"  (160 cm)  BEHAVIORAL SYMPTOMS/MOOD NEUROLOGICAL BOWEL NUTRITION STATUS      Continent Diet(regular)  AMBULATORY STATUS COMMUNICATION OF NEEDS Skin   Supervision Verbally Normal                       Personal Care Assistance Level of Assistance  Bathing, Feeding, Dressing Bathing Assistance: Limited assistance Feeding assistance: Independent Dressing Assistance: Limited assistance     Functional Limitations Info  Sight, Hearing, Speech Sight Info:  Impaired(wears glasses) Hearing Info: Adequate Speech Info: Adequate    SPECIAL CARE FACTORS FREQUENCY  PT (By licensed PT), OT (By licensed OT)     PT Frequency: 3x/wk OT Frequency: 3x/wk            Contractures Contractures Info: Not present    Additional Factors Info  Code Status, Allergies, Psychotropic Code Status Info: Full Allergies Info: Codeine Psychotropic Info: Prozac 20mg  daily; Xanax 1mg  4x/day PRN         Current Medications (08/08/2017):  This is the current hospital active medication list Current Facility-Administered Medications  Medication Dose Route Frequency Provider Last Rate Last Dose  . 0.9 %  sodium chloride infusion  250 mL Intravenous PRN Pearson Grippe, MD      . acetaminophen (TYLENOL) tablet 650 mg  650 mg Oral Q6H PRN Pearson Grippe, MD   650 mg at 08/07/17 1624   Or  . acetaminophen (TYLENOL) suppository 650 mg  650 mg Rectal Q6H PRN Pearson Grippe, MD      . albuterol (PROVENTIL) (2.5 MG/3ML) 0.083% nebulizer solution 2.5 mg  2.5 mg Inhalation Q6H PRN Pearson Grippe, MD      . ALPRAZolam Prudy Feeler) tablet 1 mg  1 mg Oral QID PRN Pearson Grippe, MD   1 mg at 08/08/17 1001  . desvenlafaxine (PRISTIQ) 24 hr tablet 50 mg  50 mg Oral Daily Regalado, Belkys A, MD   50 mg at 08/08/17 1001  . enoxaparin (LOVENOX) injection 40 mg  40 mg Subcutaneous Daily Pearson Grippe, MD   40 mg at 08/08/17 1001  .  FLUoxetine (PROZAC) capsule 20 mg  20 mg Oral Daily Pearson GrippeKim, James, MD   20 mg at 08/08/17 1001  . gabapentin (NEURONTIN) tablet 600 mg  600 mg Oral TID Pearson GrippeKim, James, MD   600 mg at 08/08/17 1534  . HYDROmorphone (DILAUDID) injection 0.5-1 mg  0.5-1 mg Intravenous Q4H PRN Regalado, Belkys A, MD   0.5 mg at 08/08/17 1534  . levothyroxine (SYNTHROID, LEVOTHROID) tablet 25 mcg  25 mcg Oral QAC breakfast Pearson GrippeKim, James, MD   25 mcg at 08/08/17 0636  . [START ON 08/09/2017] loratadine (CLARITIN) tablet 10 mg  10 mg Oral Daily Regalado, Belkys A, MD      . methocarbamol (ROBAXIN) tablet 500 mg  500  mg Oral Q6H PRN Pearson GrippeKim, James, MD   500 mg at 08/06/17 1205  . [START ON 08/09/2017] multivitamin with minerals tablet 1 tablet  1 tablet Oral Daily Regalado, Belkys A, MD      . ondansetron (ZOFRAN) injection 4 mg  4 mg Intravenous Q6H PRN Pearson GrippeKim, James, MD   4 mg at 08/07/17 0104  . oxyCODONE (Oxy IR/ROXICODONE) immediate release tablet 5-10 mg  5-10 mg Oral Q6H PRN Regalado, Belkys A, MD      . polyethylene glycol (MIRALAX / GLYCOLAX) packet 17 g  17 g Oral Daily Regalado, Belkys A, MD   17 g at 08/08/17 1310  . sodium chloride flush (NS) 0.9 % injection 3 mL  3 mL Intravenous Q12H Pearson GrippeKim, James, MD   3 mL at 08/08/17 1001  . sodium chloride flush (NS) 0.9 % injection 3 mL  3 mL Intravenous PRN Pearson GrippeKim, James, MD      . SUMAtriptan (IMITREX) tablet 25 mg  25 mg Oral PRN Regalado, Belkys A, MD   25 mg at 08/07/17 1623     Discharge Medications: Please see discharge summary for a list of discharge medications.  Relevant Imaging Results:  Relevant Lab Results:   Additional Information SS#: [id number]  Baldemar LenisElizabeth M Laval Cafaro, LCSW

## 2017-08-08 NOTE — Progress Notes (Addendum)
UPDATE 5:09 PM:  CSW met with patient to discuss potential to sending referral to [facility], per patient request, as patient is refusing to speak to [facility] and does not want her in the room. Patient discussed how she was ok with sending the referral, "but I'm concerned that incorrect information is going to be sent to them, and it's not going to show that I need skilled nursing and rehabilitation". CSW informed patient that only information within the patient's chart can be sent out for referrals, and there isn't any incorrect information in the patient's chart. Patient was agreeable to sending referral to [facility]. Patient was upset during discussion, complaining about how she can't walk and she needs to learn how to walk again.   CSW to continue to follow.  Laveda Abbe, LCSW Clinical Social Worker [phone]     CSW met with patient to provide bed offers. Patient continued to complain about discharge plan and how no one is understanding her needs. CSW provided bed offers and told patient to review them and determine a SNF preference by tomorrow. Patient continues to request inpatient rehab and wants [facility]. CSW reminded patient that she is not medically appropriate for inpatient rehab, but patient did not understand and said that the MD told her that she needed it.   Patient was refusing to look at bed offers list while CSW was in the room but agreed that she had homework for the evening. CSW indicated that she would follow up with her tomorrow.  Patient's discharge recommendations remain home health, but per patient request, CSW has initiated SNF search.   CSW to follow.  Laveda Abbe, [place] Clinical Social Worker [phone]

## 2017-08-08 NOTE — Progress Notes (Signed)
CM met with the patient to see about d/c plans. She states she can not make a decision until she knows what PT is going to recommend equipment wise.  CM asked PT to please see the patient in the am so that PT could assess if [name] is still the recommendation and to see what DME the patient requires. CM again reminded the patient to see what support she can arrange at home.

## 2017-08-08 NOTE — Progress Notes (Signed)
Patient ambulated with walker to the bathroom, stood at sink to wash hands & brush teeth, and back to the bed without the assistance of RN  RN only stood by patient.

## 2017-08-08 NOTE — Progress Notes (Addendum)
Inpatient Rehabilitation  Met with patient to discuss determination that she is not an IP Rehab candidate.  Please refer to note by PA, Linna Hoff.  Recommend patient discharge home with home health therapies as recommended by therapy team given Supervision level.  Patient appeared upset with this recommendation.  Attempted to explain it to her with case manger and CSW, but she requested to speak with Administrator on call.  Notified bedside RN of request.  Will sign off at this time.  Call if questions.   Carmelia Roller., CCC/SLP Admission Coordinator  [place]  Cell [phone]

## 2017-08-08 NOTE — Plan of Care (Signed)
Ms. Ozella RocksBroder-Dunlevy was very complimentary of her overnight care team.  This morning, she was undecided whether to pursue rehab because her pain management regimen and pain/anxiety medication might be changed.   . She ambulated with physical therapy in the unit using a walker.  Dr. Sunnie Nielsenegalado came in shortly therafter and the patient complained of severe pain, prompting a new IV to be placed.  IV team had to be consulted for ultrasound after 3 failed attempts.  The patient repeatedly refused PO pain intervention because she didn't want to delay IV medication.  IV team was prompt, but had to delay insertion due to pt requests for restroom and administrator (see below).   The patient is visibly upset about her discharge plan, believing now that she needs inpatient rehabilitation.  CSW, CM and Rehab all spoke with with patient.  She was unhappy and expressed desire to see AO and Dept. Director--both of which went to see her.  At this time, patient is resting in bed.  She appears to be in no physical or emotional distress.    Nursing POC:  effective pain management, ambulation 1 assist, psychosocial support and discharge planning education.

## 2017-08-08 NOTE — Progress Notes (Signed)
PROGRESS NOTE    Toni [name]  ZOX:[id number] DOB: 13-May-1955 DOA: 08/05/2017 PCP: Madelin Headings, MD   Brief Narrative: Toni [name]  is a 62 y.o. female, w Hepatitis C, Depression, Migraines, Cervicalgia, who presents with c/o left groin pain since fall on Thursday.  Pt was evaluated in ER 8/2, and then again earlier on 8/3.  In ED,; CT left hip  IMPRESSION: 1. No evidence of acute fracture or dislocation at the left hip. Previously questioned fracture of the left superior pubic ramus demonstrates no definite CT correlate. Image quality is degraded by body habitus. 2. Left hip degenerative changes. No significant joint effusion. 3. No periarticular hematoma.  Wbc 6.2, Hgb 12.5, Plt 189. Na 140, K 3.9, Bun 14, Creatinine 0.74. Asked by ER to evaluate the patient for left groin pain   Assessment & Plan:   Principal Problem:   Left groin pain Active Problems:   Generalized anxiety disorder   1-Left groin, leg pain. Secondary to left superior and inferior pubic rami fracture.  CT hip negative for fracture.  MRI confirms the presence of nondisplaced acute fractures of the left superior and inferior pubic rami. Continue with pain management.  PT evaluation. Recommending H-H PT.  MRI lumbar spine; spondylosis.    2-Depression;  Continue with meds Resume dextroamphetamine. Patient will need to bring home supply.   3-Migraine; continue with Imitrex.   4-Multiples Ribs fracture:  There are mildly displaced fractures of the anterolateral aspects of the left eighth, ninth, and tenth ribs.  Incentive spirometry.  Pain management.   5-Fifth metatarsal fracture;  Will ask ortho to give rec for ortho shoes.    DVT prophylaxis: lovenox Code Status: full code.  Family Communication: care discussed with patient.  Disposition Plan: Patient with uncontrol pain with oral pain medications. She will be started on IV dilaudid. Monitor. Also working on  disposition.    Consultants:  none  Procedures:   none   Antimicrobials; none   Subjective: She is crying, she is having a lot pain, pubic area. She is also complaining of her chronic headaches.  She report she had  Fifth metatarsal left fracture. After she fell , walking the steps with moderate heel shoes.  She relates that she fell also , sled and hit her side, ribs with her night stand table on June 7.    Objective: Vitals:   08/08/17 0245 08/08/17 0350 08/08/17 0828 08/08/17 1227  BP:  115/73 114/67 118/64  Pulse:  61 72 86  Resp:  16 16 20   Temp:  (!) 97.5 F (36.4 C) 97.8 F (36.6 C)   TempSrc:  Oral Oral   SpO2:  99% 100% 98%  Weight: 94.2 kg (207 lb 10.8 oz)     Height:       No intake or output data in the 24 hours ending 08/08/17 1543 Filed Weights   08/05/17 2340 08/07/17 0340 08/08/17 0245  Weight: 95.3 kg (210 lb 1.6 oz) 95.3 kg (210 lb 1.6 oz) 94.2 kg (207 lb 10.8 oz)    Examination:  General exam: NAD Respiratory system: CTA Cardiovascular system: S 1, S 2 RRR Gastrointestinal system: BS present, soft ,NT Central nervous system: Alert,  Extremities: Unable to move left leg Skin; no rashes.    Data Reviewed: I have personally reviewed following labs and imaging studies  CBC: Recent Labs  Lab 08/05/17 1941 08/06/17 0541 08/08/17 1206  WBC 6.2 5.4 6.3  HGB 12.5 11.5* 12.3  HCT 41.3 37.6  39.3  MCV 91.6 89.5 89.5  PLT 189 187 206   Basic Metabolic Panel: Recent Labs  Lab 08/05/17 1941 08/06/17 0541  NA 140 140  K 3.9 3.7  CL 102 105  CO2 30 27  GLUCOSE 98 98  BUN 14 17  CREATININE 0.74 0.78  CALCIUM 9.0 8.7*   GFR: Estimated Creatinine Clearance: 78.5 mL/min (by C-G formula based on SCr of 0.78 mg/dL). Liver Function Tests: Recent Labs  Lab 08/06/17 0541  AST 19  ALT 20  ALKPHOS 68  BILITOT 0.4  PROT 6.3*  ALBUMIN 3.2*   No results for input(s): LIPASE, AMYLASE in the last 168 hours. No results for input(s):  AMMONIA in the last 168 hours. Coagulation Profile: No results for input(s): INR, PROTIME in the last 168 hours. Cardiac Enzymes: No results for input(s): CKTOTAL, CKMB, CKMBINDEX, TROPONINI in the last 168 hours. BNP (last 3 results) No results for input(s): PROBNP in the last 8760 hours. HbA1C: No results for input(s): HGBA1C in the last 72 hours. CBG: No results for input(s): GLUCAP in the last 168 hours. Lipid Profile: No results for input(s): CHOL, HDL, LDLCALC, TRIG, CHOLHDL, LDLDIRECT in the last 72 hours. Thyroid Function Tests: No results for input(s): TSH, T4TOTAL, FREET4, T3FREE, THYROIDAB in the last 72 hours. Anemia Panel: No results for input(s): VITAMINB12, FOLATE, FERRITIN, TIBC, IRON, RETICCTPCT in the last 72 hours. Sepsis Labs: No results for input(s): PROCALCITON, LATICACIDVEN in the last 168 hours.  No results found for this or any previous visit (from the past 240 hour(s)).       Radiology Studies: Dg Ribs Unilateral Left  Result Date: 08/07/2017 CLINICAL DATA:  Recent fall with pelvic fracture. Possible left ribcage injury. EXAM: LEFT RIBS - 2 VIEW COMPARISON:  Chest x-ray of January 26, 2016 FINDINGS: The lungs are adequately inflated. There is no focal infiltrate. There is no pleural effusion or pneumothorax. The heart is normal in size. The pulmonary vascularity is not engorged. There is minimal linear increased density projecting just above the left lateral costophrenic angle. There are fractures of the lateral aspects of the left eighth, ninth, and tenth ribs. IMPRESSION: There are mildly displaced fractures of the anterolateral aspects of the left eighth, ninth, and tenth ribs. There is no pleural effusion or pneumothorax but minimal subsegmental atelectasis just above the left lateral costophrenic angle is observed. Otherwise no acute cardiopulmonary abnormality. Electronically Signed   By: David  SwazilandJordan M.D.   On: 08/07/2017 13:46        Scheduled  Meds: . desvenlafaxine  50 mg Oral Daily  . enoxaparin (LOVENOX) injection  40 mg Subcutaneous Daily  . FLUoxetine  20 mg Oral Daily  . gabapentin  600 mg Oral TID  . levothyroxine  25 mcg Oral QAC breakfast  . [START ON 08/09/2017] loratadine  10 mg Oral Daily  . [START ON 08/09/2017] multivitamin with minerals  1 tablet Oral Daily  . polyethylene glycol  17 g Oral Daily  . sodium chloride flush  3 mL Intravenous Q12H   Continuous Infusions: . sodium chloride       LOS: 0 days    Time spent: 35 minutes.     Alba CoryBelkys A Demarrion Meiklejohn, MD [facility] Pager [phone]  If 7PM-7AM, please contact night-coverage [email or web address] Password [email or web address] 08/08/2017, 3:43 PM

## 2017-08-08 NOTE — Progress Notes (Signed)
Physical medicine rehab consult requested and chart reviewed.  Initial evaluation by therapies 08/06/2017 patient at a supervision level and recommendations are for discharge to home with home health therapies.  It is also highly unlikely that insurance will justify inpatient rehab services.  At this time recommendations are discharge  to home with home health therapies

## 2017-08-08 NOTE — Progress Notes (Signed)
Physical Therapy Treatment Patient Details Name: Toni MaxcyRhonda Lynn [name] MRN: [id number] DOB: 09/12/1955 Today's Date: 08/08/2017    History of Present Illness Pt is a 62 y.o. female, with PMH of hepatitis C, depression, migraines, cervicalgia, and 5th metatarsal fx 06-10-17. She presented with c/o left groin pain since fall on Thursday. [facility] but was discharged as x-rays were negative. She continued to struggle at home and [facility] CT was also negative and MRI was ordered. This showed left sup/inf pubic rami fxs with adductor strain and orthopedic surgery was consulted.    PT Comments    Patient is making progress toward PT goals. Pt tolerated increased gait distance this session with heavy reliance on bilat UE support. Pt demonstrates decreased safety awareness and very concerned about medications throughout session. Pt will need 24 hour supervision/assistance at home and will continue to benefit from further skilled PT services to maximize independence and safety with mobility.   Follow Up Recommendations  Home health PT;Supervision/Assistance - 24 hour     Equipment Recommendations  Rolling walker with 5" wheels;3in1 (PT)    Recommendations for Other Services       Precautions / Restrictions Precautions Precautions: Fall Restrictions Weight Bearing Restrictions: No    Mobility  Bed Mobility Overal bed mobility: Modified Independent Bed Mobility: Supine to Sit           General bed mobility comments: use of rail to get to EOB   Transfers Overall transfer level: Needs assistance Equipment used: Rolling walker (2 wheeled) Transfers: Sit to/from Stand Sit to Stand: Min guard         General transfer comment: cues for safe hand placement  Ambulation/Gait Ambulation/Gait assistance: Supervision;Min guard Gait Distance (Feet): 150 Feet Assistive device: Rolling walker (2 wheeled) Gait Pattern/deviations:  Step-through pattern;Decreased stride length Gait velocity: decreased   General Gait Details: cues for posture; heavy reliance on bilat UE support    Stairs             Wheelchair Mobility    Modified Rankin (Stroke Patients Only)       Balance Overall balance assessment: Needs assistance Sitting-balance support: No upper extremity supported;Feet supported Sitting balance-Leahy Scale: Good     Standing balance support: Bilateral upper extremity supported;During functional activity Standing balance-Leahy Scale: Fair Standing balance comment: pt able to statically stand for hand hygiene without UE support                            Cognition Arousal/Alertness: Awake/alert Behavior During Therapy: WFL for tasks assessed/performed Overall Cognitive Status: Within Functional Limits for tasks assessed                                 General Comments: pt loses train of thought easily and demonstrated decreased short term memory deficits during session and asking ex husband to write down information; Complicated family hx. Has joint custody with her ex-husband of her 565 y.o. grandson. Chronic pain      Exercises      General Comments General comments (skin integrity, edema, etc.): ex husband present during session      Pertinent Vitals/Pain Pain Assessment: Faces Faces Pain Scale: Hurts a little bit Pain Location: L groin with mobility Pain Descriptors / Indicators: Sore;Guarding    Home Living  Prior Function            PT Goals (current goals can now be found in the care plan section) Acute Rehab PT Goals PT Goal Formulation: With patient Time For Goal Achievement: 08/20/17 Potential to Achieve Goals: Good Progress towards PT goals: Progressing toward goals    Frequency    Min 3X/week      PT Plan Current plan remains appropriate    Co-evaluation              AM-PAC PT "6 Clicks" Daily  Activity  Outcome Measure  Difficulty turning over in bed (including adjusting bedclothes, sheets and blankets)?: A Little Difficulty moving from lying on back to sitting on the side of the bed? : A Little Difficulty sitting down on and standing up from a chair with arms (e.g., wheelchair, bedside commode, etc,.)?: A Little Help needed moving to and from a bed to chair (including a wheelchair)?: A Little Help needed walking in hospital room?: A Little Help needed climbing 3-5 steps with a railing? : A Lot 6 Click Score: 17    End of Session Equipment Utilized During Treatment: Gait belt Activity Tolerance: Patient tolerated treatment well Patient left: with call bell/phone within reach;in chair;with family/visitor present Nurse Communication: Mobility status PT Visit Diagnosis: Pain;Difficulty in walking, not elsewhere classified (R26.2);Other abnormalities of gait and mobility (R26.89) Pain - Right/Left: Left Pain - part of body: Hip     Time: 4098-1191 PT Time Calculation (min) (ACUTE ONLY): 40 min  Charges:  $Gait Training: 23-37 mins $Therapeutic Activity: 8-22 mins                     Erline Levine, PTA Pager: [phone]     Carolynne Edouard 08/08/2017, 3:21 PM

## 2017-08-09 DIAGNOSIS — R1032 Left lower quadrant pain: Secondary | ICD-10-CM | POA: Diagnosis not present

## 2017-08-09 NOTE — Progress Notes (Signed)
CSW met with patient to discuss bed offers. Patient asked about [facility]; referral from [place] has not been reviewed yet, they are still looking at the referral. Patient attempted to ask multiple questions, but was not clear in what she was asking so CSW was unable to provide information without clear questions. When patient clearly asked a question, CSW provided information. Patient asked about what information was sent in the referral, and CSW indicated that it was MD and PT notes sent in the evaluation. Patient asked about what the recommendations were that were being sent, and CSW told patient that the recommendations were still for home health services. CSW indicated that nurse report from overnight also indicated that she was able to get up and go to the bathroom multiple times without any physical assistance. Patient appeared frustrated about that being documented, and stated that she did need assistance because she had to lean up against the sink when she was washing her hands.   CSW asked about patient looking over the bed offers, and patient indicated that she still needed more time to research. CSW obtained patient's Medicare A card and made a copy for her so that it could get added to her chart, per her request. CSW provided card back to patient after making a copy.  CSW to continue to follow.  Laveda Abbe, [place] Clinical Social Worker [phone]

## 2017-08-09 NOTE — Progress Notes (Addendum)
Physical Therapy Treatment Patient Details Name: Toni [name] MRN: [id number] DOB: 12-25-1955 Today's Date: 08/09/2017    History of Present Illness Pt is a 62 y.o. female, with PMH of hepatitis C, depression, migraines, cervicalgia, and 5th metatarsal fx 06-10-17. She presented with c/o left groin pain since fall on Thursday. She went to [facility] but was discharged as x-rays were negative. She continued to struggle at home and family brought her to [facility] for another evaluation. CT was also negative and MRI was ordered. This showed left sup/inf pubic rami fxs with adductor strain and orthopedic surgery was consulted.    PT Comments    Patient regressing in therapy, but seems to be likely self limiting. Ambulating 20 feet with walker and min guard assist, with significantly decreased gait speed, gait abnormalities, and increased time/effort. Gait speed indicative of high fall risk. Very emotionally labile throughout, stating "I can't go home, I wont be able to do anything for myself or get anything done." Rest of session focused on lower extremity strengthening. Based on current assessment, updated d/c plan to SNF. Patient lives alone and states she is unable to care for herself. Will follow acutely to progress mobility and re-assess.    Follow Up Recommendations  SNF     Equipment Recommendations  Rolling walker with 5" wheels;3in1 (PT)    Recommendations for Other Services       Precautions / Restrictions Precautions Precautions: Fall Restrictions Weight Bearing Restrictions: No    Mobility  Bed Mobility Overal bed mobility: Needs Assistance Bed Mobility: Supine to Sit;Sit to Supine     Supine to sit: HOB elevated;Min assist(increased time required, use of rail) Sit to supine: Min assist;HOB elevated(assist to elevate BLE into bed)      Transfers Overall transfer level: Needs assistance Equipment used: Rolling walker (2 wheeled) Transfers: Sit to/from Stand Sit to  Stand: Min guard;From elevated surface         General transfer comment: cues for safe hand placement. transfer from edge of bed and toilet  Ambulation/Gait Ambulation/Gait assistance: Supervision;Min guard Gait Distance (Feet): 20 Feet Assistive device: Rolling walker (2 wheeled) Gait Pattern/deviations: Step-through pattern;Decreased weight shift to left;Decreased stride length Gait velocity: decreased Gait velocity interpretation: <1.31 ft/sec, indicative of household ambulator General Gait Details: patient with varying weightbearing through LLE, occasionally dragging but tending to pick up foot later in the sesion. heavy reliance through BUE's. Significantly slow and effortful gait, taking ~15 minutes to walk 20 feet. Cues for direction, segmental turning and walker management. when turning, pivoted on right foot.   Stairs             Wheelchair Mobility    Modified Rankin (Stroke Patients Only)       Balance Overall balance assessment: Needs assistance Sitting-balance support: No upper extremity supported;Feet supported Sitting balance-Leahy Scale: Good     Standing balance support: Bilateral upper extremity supported;During functional activity Standing balance-Leahy Scale: Fair Standing balance comment: pt able to statically stand without support                            Cognition Arousal/Alertness: Awake/alert Behavior During Therapy: WFL for tasks assessed/performed Overall Cognitive Status: Within Functional Limits for tasks assessed                                 General Comments: pt loses train of thought easily and  needs to be frequently redirected to task/conversation at hand. demonstrated decreased short term memory deficits during session and asking ex husband to write down information; Complicated family hx. Has joint custody with her ex-husband of her 415 y.o. grandson. Chronic pain      Exercises General Exercises -  Lower Extremity Long Arc Quad: 10 reps;Both;Seated Heel Slides: 5 reps;Both;Supine Hip Flexion/Marching: Seated;10 reps;Both    General Comments General comments (skin integrity, edema, etc.): ex husband present      Pertinent Vitals/Pain Pain Assessment: Faces Pain Score: 10-Worst pain ever Faces Pain Scale: Hurts even more Pain Location: headache (migrane occipital) Pain Descriptors / Indicators: Headache Pain Intervention(s): Monitored during session    Home Living Family/patient expects to be discharged to:: Private residence Living Arrangements: Other relatives(64 year old grandson who she is partial caregiver for) Available Help at Discharge: Family;Available PRN/intermittently Type of Home: Other(Comment)(townhome) Home Access: Level entry   Home Layout: Able to live on main level with bedroom/bathroom Home Equipment: Walker - 2 wheels      Prior Function Level of Independence: Independent          PT Goals (current goals can now be found in the care plan section) Acute Rehab PT Goals Patient Stated Goal: get rehab to get more independent    Frequency    Min 3X/week      PT Plan Discharge plan needs to be updated    Co-evaluation              AM-PAC PT "6 Clicks" Daily Activity  Outcome Measure  Difficulty turning over in bed (including adjusting bedclothes, sheets and blankets)?: A Little Difficulty moving from lying on back to sitting on the side of the bed? : Unable Difficulty sitting down on and standing up from a chair with arms (e.g., wheelchair, bedside commode, etc,.)?: A Little Help needed moving to and from a bed to chair (including a wheelchair)?: A Little Help needed walking in hospital room?: A Little Help needed climbing 3-5 steps with a railing? : A Lot 6 Click Score: 15    End of Session Equipment Utilized During Treatment: Gait belt Activity Tolerance: Patient limited by pain;Other (comment)(limited by lability) Patient left:  in bed;with call bell/phone within reach;with bed alarm set;with family/visitor present   PT Visit Diagnosis: Pain;Difficulty in walking, not elsewhere classified (R26.2);Other abnormalities of gait and mobility (R26.89) Pain - Right/Left: Left Pain - part of body: Hip     Time: [id number] PT Time Calculation (min) (ACUTE ONLY): 42 min  Charges:  $Gait Training: 8-22 mins $Therapeutic Exercise: 8-22 mins $Therapeutic Activity: 8-22 mins                    Laurina Bustlearoline Jakai Risse, PT, DPT Acute Rehabilitation Services  Pager: [phone]   Vanetta MuldersCarloine H Minah Axelrod 08/09/2017, 3:39 PM

## 2017-08-09 NOTE — Progress Notes (Signed)
PROGRESS NOTE  Joline MaxcyRhonda Lynn Broder-Dunlevy ZOX:[id number] DOB: 06/06/1955 DOA: 08/05/2017 PCP: Madelin HeadingsPanosh, Wanda K, MD  HPI/Recap of past 24 hours:  Continue to have significant pain , with even slight reposition in bed, she continue to require IV dilaudid for pain control  Assessment/Plan: Principal Problem:   Left groin pain Active Problems:   Generalized anxiety disorder   left superior and inferior pubic rami fracture.  -CT hip negative for fracture.  -MRI confirms the presence of nondisplaced acute fractures of the left superior and inferior pubic rami. -MRI lumbar spine; spondylosis.  -Continue with pain management.   Multiples Ribs fracture:  There are mildly displaced fractures of the anterolateral aspects of the left eighth, ninth, and tenth ribs.  Incentive spirometry.  Pain management.  Fifth metatarsal fracture ortho to give rec for ortho shoes   Depression;  Continue with meds Resume dextroamphetamine. Patient will need to bring home supply.   Migraine; continue with Imitrex.   FTT/fall PT eval  Code Status: full  Family Communication: patient   Disposition Plan: cir vs snf   Consultants:  cir  Procedures:  none  Antibiotics:  none   Objective: BP 112/67 (BP Location: Left Arm)   Pulse 75   Temp 97.8 F (36.6 C) (Oral)   Resp 18   Ht 5\' 3"  (1.6 m)   Wt 96.4 kg (212 lb 8.4 oz)   SpO2 97%   BMI 37.65 kg/m  No intake or output data in the 24 hours ending 08/09/17 2150 Filed Weights   08/07/17 0340 08/08/17 0245 08/09/17 0423  Weight: 95.3 kg (210 lb 1.6 oz) 94.2 kg (207 lb 10.8 oz) 96.4 kg (212 lb 8.4 oz)    Exam: Patient is examined daily including today on 08/09/2017, exams remain the same as of yesterday except that has changed    General:  NAD  Cardiovascular: RRR  Respiratory: CTABL  Abdomen: Soft/ND/NT, positive BS  Musculoskeletal: No Edema  Neuro: alert, oriented   Data Reviewed: Basic Metabolic Panel: Recent  Labs  Lab 08/05/17 1941 08/06/17 0541  NA 140 140  K 3.9 3.7  CL 102 105  CO2 30 27  GLUCOSE 98 98  BUN 14 17  CREATININE 0.74 0.78  CALCIUM 9.0 8.7*   Liver Function Tests: Recent Labs  Lab 08/06/17 0541  AST 19  ALT 20  ALKPHOS 68  BILITOT 0.4  PROT 6.3*  ALBUMIN 3.2*   No results for input(s): LIPASE, AMYLASE in the last 168 hours. No results for input(s): AMMONIA in the last 168 hours. CBC: Recent Labs  Lab 08/05/17 1941 08/06/17 0541 08/08/17 1206  WBC 6.2 5.4 6.3  HGB 12.5 11.5* 12.3  HCT 41.3 37.6 39.3  MCV 91.6 89.5 89.5  PLT 189 187 206   Cardiac Enzymes:   No results for input(s): CKTOTAL, CKMB, CKMBINDEX, TROPONINI in the last 168 hours. BNP (last 3 results) No results for input(s): BNP in the last 8760 hours.  ProBNP (last 3 results) No results for input(s): PROBNP in the last 8760 hours.  CBG: No results for input(s): GLUCAP in the last 168 hours.  No results found for this or any previous visit (from the past 240 hour(s)).   Studies: No results found.  Scheduled Meds: . desvenlafaxine  50 mg Oral Daily  . dextroamphetamine  30-45 mg Oral Daily  . enoxaparin (LOVENOX) injection  40 mg Subcutaneous Daily  . FLUoxetine  20 mg Oral Daily  . gabapentin  600 mg Oral TID  .  levothyroxine  25 mcg Oral QAC breakfast  . loratadine  10 mg Oral Daily  . multivitamin with minerals  1 tablet Oral Daily  . polyethylene glycol  17 g Oral Daily  . sodium chloride flush  3 mL Intravenous Q12H    Continuous Infusions: . sodium chloride       Time spent: I have personally reviewed and interpreted on  08/09/2017 daily labs,  imagings as discussed above under date review session and assessment and plans.  I reviewed all nursing notes, pharmacy notes,  vitals, pertinent old records  I have discussed plan of care as described above with RN , patient on 08/09/2017   Albertine Grates MD, PhD  [facility] Pager [phone]. If 7PM-7AM, please  contact night-coverage at [email or web address], password [facility] 08/09/2017, 9:50 PM  LOS: 0 days

## 2017-08-09 NOTE — Evaluation (Signed)
Occupational Therapy Evaluation Patient Details Name: Toni [name] MRN: [id number] DOB: 11/27/1955 Today's Date: 08/09/2017    History of Present Illness Pt is a 62 y.o. female, with PMH of hepatitis C, depression, migraines, cervicalgia, and 5th metatarsal fx 06-10-17. She presented with c/o left groin pain since fall on Thursday. She went to [facility] but was discharged as x-rays were negative. She continued to struggle at home and family brought her to [facility] for another evaluation. CT was also negative and MRI was ordered. This showed left sup/inf pubic rami fxs with adductor strain and orthopedic surgery was consulted.   Clinical Impression   PTA Pt reports being independent in ADL and mobility. Pt is currently min guard with RW for ambulation to bathroom. She is able to perform toilet transfers with assist from grab bars and peri care at min guard level. She is unable to access her LB for donning socks at this time and requires Mod A due to pain. She is able to maintain static standing at sink for grooming at min guard level for short periods of time but demonstrates decreased activity tolerance due to pain. 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 At this time she requires SNF level care post-acute to maximize safety and independence in ADL and functional transfers with a  Focus on education for energy conservation, balance training, and AE for LB ADL. OT will continue to follow acutely, next session to focus on AE for LB ADL.     Follow Up Recommendations  SNF    Equipment Recommendations  3 in 1 bedside commode;Other (comment)(defer to next venue)    Recommendations for Other Services       Precautions / Restrictions Precautions Precautions: Fall Restrictions Weight Bearing Restrictions: No      Mobility Bed Mobility Overal bed mobility: Needs Assistance Bed Mobility: Supine to Sit;Sit to Supine     Supine to sit: Min guard;HOB  elevated(increased time required, use of rail) Sit to supine: Min assist;HOB elevated(assist to elevate BLE into bed)      Transfers Overall transfer level: Needs assistance Equipment used: Rolling walker (2 wheeled) Transfers: Sit to/from Stand Sit to Stand: Min guard         General transfer comment: cues for safe hand placement    Balance Overall balance assessment: Needs assistance Sitting-balance support: No upper extremity supported;Feet supported Sitting balance-Leahy Scale: Good     Standing balance support: Bilateral upper extremity supported;During functional activity Standing balance-Leahy Scale: Fair Standing balance comment: pt able to statically stand without support                           ADL either performed or assessed with clinical judgement   ADL Overall ADL's : Needs assistance/impaired Eating/Feeding: Modified independent;Sitting   Grooming: Min guard;Standing Grooming Details (indicate cue type and reason): limited activity tolerance Upper Body Bathing: Sitting;Supervision/ safety   Lower Body Bathing: Minimal assistance;Sitting/lateral leans;With adaptive equipment Lower Body Bathing Details (indicate cue type and reason): will require long handle sponge to access LB for bathing Upper Body Dressing : Set up   Lower Body Dressing: Moderate assistance;Sit to/from stand Lower Body Dressing Details (indicate cue type and reason): unable to don socks at this time Toilet Transfer: Min guard;Ambulation;RW Toilet Transfer Details (indicate cue type and reason): to bathroom, heavy use of grab bars Toileting- Clothing Manipulation and Hygiene: Min guard;Sit to/from stand Toileting -  Clothing Manipulation Details (indicate cue type and reason): min guard for front peri care Tub/ Shower Transfer: Walk-in shower;Min guard;Ambulation;Rolling walker Tub/Shower Transfer Details (indicate cue type and reason): dependent on RW at this time Functional  mobility during ADLs: Min guard;Rolling walker(dependent on RW for mobility) General ADL Comments: able to static stand without DME, decreased activity tolerance     Vision Baseline Vision/History: Wears glasses Wears Glasses: Reading only Patient Visual Report: No change from baseline       Perception     Praxis      Pertinent Vitals/Pain Pain Assessment: 0-10 Pain Score: 10-Worst pain ever Pain Location: headache (migrane occipital) Pain Descriptors / Indicators: Headache Pain Intervention(s): Monitored during session;Repositioned     Hand Dominance Right   Extremity/Trunk Assessment Upper Extremity Assessment Upper Extremity Assessment: Overall WFL for tasks assessed   Lower Extremity Assessment Lower Extremity Assessment: Defer to PT evaluation   Cervical / Trunk Assessment Cervical / Trunk Assessment: Normal   Communication Communication Communication: No difficulties   Cognition Arousal/Alertness: Awake/alert Behavior During Therapy: WFL for tasks assessed/performed Overall Cognitive Status: Within Functional Limits for tasks assessed                                 General Comments: pt loses train of thought easily and demonstrated decreased short term memory deficits during session and asking ex husband to write down information; Complicated family hx. Has joint custody with her ex-husband of her 93 y.o. grandson. Chronic pain   General Comments  ex husband present during session    Exercises     Shoulder Instructions      Home Living Family/patient expects to be discharged to:: Private residence Living Arrangements: Other relatives(47 year old grandson who she is partial caregiver for) Available Help at Discharge: Family;Available PRN/intermittently Type of Home: Other(Comment)(townhome) Home Access: Level entry     Home Layout: Able to live on main level with bedroom/bathroom     Bathroom Shower/Tub: Tub only;Walk-in shower;Door    Bathroom Toilet: Handicapped height     Home Equipment: Environmental consultant - 2 wheels          Prior Functioning/Environment Level of Independence: Independent                 OT Problem List: Decreased activity tolerance;Impaired balance (sitting and/or standing);Decreased knowledge of use of DME or AE;Obesity;Pain      OT Treatment/Interventions: Self-care/ADL training;Energy conservation;DME and/or AE instruction;Therapeutic activities;Patient/family education;Balance training    OT Goals(Current goals can be found in the care plan section) Acute Rehab OT Goals Patient Stated Goal: get rehab to get more independent OT Goal Formulation: With patient Time For Goal Achievement: 08/23/17 Potential to Achieve Goals: Good ADL Goals Pt Will Perform Grooming: with modified independence;standing Pt Will Perform Upper Body Dressing: with modified independence;sitting Pt Will Perform Lower Body Dressing: with modified independence;sit to/from stand;with adaptive equipment Pt Will Transfer to Toilet: with modified independence;ambulating Pt Will Perform Toileting - Clothing Manipulation and hygiene: with modified independence;sit to/from stand Additional ADL Goal #1: Pt will recall 3 ways of conserving energy during ADL routine at independent level  OT Frequency: Min 2X/week   Barriers to D/C: Decreased caregiver support  Pt lives alone and is partial caregiver (custody) of 61 year old grandson       Co-evaluation              AM-PAC PT "6 Clicks" Daily Activity  Outcome Measure Help from another person eating meals?: None Help from another person taking care of personal grooming?: A Little Help from another person toileting, which includes using toliet, bedpan, or urinal?: A Little Help from another person bathing (including washing, rinsing, drying)?: A Lot Help from another person to put on and taking off regular upper body clothing?: A Little Help from another person to put  on and taking off regular lower body clothing?: A Lot 6 Click Score: 17   End of Session Equipment Utilized During Treatment: Rolling walker Nurse Communication: Mobility status;Patient requests pain meds  Activity Tolerance: Patient limited by pain Patient left: in bed;with call bell/phone within reach;with bed alarm set;with family/visitor present  OT Visit Diagnosis: Unsteadiness on feet (R26.81);Other abnormalities of gait and mobility (R26.89);Muscle weakness (generalized) (M62.81);Pain Pain - Right/Left: (central) Pain - part of body: (head (ache))                Time: 3664-40341316-1403 OT Time Calculation (min): 47 min Charges:  OT General Charges $OT Visit: 1 Visit OT Evaluation $OT Eval Moderate Complexity: 1 Mod OT Treatments $Self Care/Home Management : 8-22 mins $Therapeutic Activity: 8-22 mins  Sherryl MangesLaura Alayne Estrella OTR/L [phone]  Evern BioLaura J Lothar Prehn 08/09/2017, 2:25 PM

## 2017-08-09 NOTE — Progress Notes (Signed)
Patient ambulated to restroom with standby assistance with NT 4 times though out the shift.  Patient appears to be forgetful at times.  Patient stated several times to this RN that she has not received any type of medication for her pain since lunch time.  RN advised patient that she has received pain medication, RN advised patient the times & pain medication type that she has received

## 2017-08-10 ENCOUNTER — Other Ambulatory Visit: Payer: Self-pay

## 2017-08-10 DIAGNOSIS — R262 Difficulty in walking, not elsewhere classified: Secondary | ICD-10-CM | POA: Diagnosis not present

## 2017-08-10 DIAGNOSIS — Z9181 History of falling: Secondary | ICD-10-CM | POA: Diagnosis not present

## 2017-08-10 DIAGNOSIS — G250 Essential tremor: Secondary | ICD-10-CM | POA: Diagnosis not present

## 2017-08-10 DIAGNOSIS — S92355D Nondisplaced fracture of fifth metatarsal bone, left foot, subsequent encounter for fracture with routine healing: Secondary | ICD-10-CM | POA: Diagnosis not present

## 2017-08-10 DIAGNOSIS — R278 Other lack of coordination: Secondary | ICD-10-CM | POA: Diagnosis not present

## 2017-08-10 DIAGNOSIS — S2242XS Multiple fractures of ribs, left side, sequela: Secondary | ICD-10-CM | POA: Diagnosis not present

## 2017-08-10 DIAGNOSIS — S32512D Fracture of superior rim of left pubis, subsequent encounter for fracture with routine healing: Secondary | ICD-10-CM | POA: Diagnosis not present

## 2017-08-10 DIAGNOSIS — S92355S Nondisplaced fracture of fifth metatarsal bone, left foot, sequela: Secondary | ICD-10-CM | POA: Diagnosis not present

## 2017-08-10 DIAGNOSIS — M6281 Muscle weakness (generalized): Secondary | ICD-10-CM | POA: Diagnosis not present

## 2017-08-10 DIAGNOSIS — M542 Cervicalgia: Secondary | ICD-10-CM | POA: Diagnosis not present

## 2017-08-10 DIAGNOSIS — S2242XD Multiple fractures of ribs, left side, subsequent encounter for fracture with routine healing: Secondary | ICD-10-CM | POA: Diagnosis not present

## 2017-08-10 DIAGNOSIS — R1032 Left lower quadrant pain: Secondary | ICD-10-CM | POA: Diagnosis not present

## 2017-08-10 DIAGNOSIS — S32592D Other specified fracture of left pubis, subsequent encounter for fracture with routine healing: Secondary | ICD-10-CM | POA: Diagnosis not present

## 2017-08-10 DIAGNOSIS — G43909 Migraine, unspecified, not intractable, without status migrainosus: Secondary | ICD-10-CM | POA: Diagnosis not present

## 2017-08-10 DIAGNOSIS — R488 Other symbolic dysfunctions: Secondary | ICD-10-CM | POA: Diagnosis not present

## 2017-08-10 MED ORDER — POLYETHYLENE GLYCOL 3350 17 G PO PACK: 17.0000 g | PACK | Freq: Every day | ORAL | 0 refills | Status: DC

## 2017-08-10 MED ORDER — ALPRAZOLAM 1 MG PO TABS: 1.0000 mg | ORAL_TABLET | Freq: Four times a day (QID) | ORAL | 0 refills | Status: DC | PRN

## 2017-08-10 MED ORDER — OXYCODONE HCL 5 MG PO TABS: 5.0000 mg | ORAL_TABLET | Freq: Four times a day (QID) | ORAL | 0 refills | Status: DC | PRN

## 2017-08-10 NOTE — Clinical Social Work Placement (Signed)
Nurse to call report to [phone], Room 501    CLINICAL SOCIAL WORK PLACEMENT  NOTE  Date:  08/10/2017  Patient Details  Name: Toni [name] MRN: [id number] Date of Birth: 04/25/1955  Clinical Social Work is seeking post-discharge placement for this patient at the Skilled  Nursing Facility level of care (*CSW will initial, date and re-position this form in  chart as items are completed):  Yes   Patient/family provided with [facility] Clinical Social Work Department's list of facilities offering this level of care within the geographic area requested by the patient (or if unable, by the patient's family).  Yes   Patient/family informed of their freedom to choose among providers that offer the needed level of care, that participate in Medicare, Medicaid or managed care program needed by the patient, have an available bed and are willing to accept the patient.  Yes   Patient/family informed of [facility]'s ownership interest in [facility] and [facility], as well as of the fact that they are under no obligation to receive care at these facilities.  PASRR submitted to EDS on       PASRR number received on 08/10/17     Existing PASRR number confirmed on       FL2 transmitted to all facilities in geographic area requested by pt/family on       FL2 transmitted to all facilities within larger geographic area on       Patient informed that his/her managed care company has contracts with or will negotiate with certain facilities, including the following:        Yes   Patient/family informed of bed offers received.  Patient [facility]     Physician recommends and patient chooses bed at      Patient to be [facility] on 08/10/17.  Patient to be transferred to facility by PTAR     Patient family notified on 08/10/17 of transfer.  Name of family member notified:        PHYSICIAN        Additional Comment:    _______________________________________________ Baldemar LenisElizabeth M Rue Tinnel, LCSW 08/10/2017, 12:52 PM

## 2017-08-10 NOTE — Care Management Note (Signed)
Case Management Note  Patient Details  Name: Joline MaxcyRhonda Lynn Broder-Dunlevy MRN: [id number] Date of Birth: 10/01/1955  Subjective/Objective:                    Action/Plan: Pt [facility] today. CM signing off.   Expected Discharge Date:  08/10/17               Expected Discharge Plan:  Home w Home Health Services  In-House Referral:  Clinical Social Work  Discharge planning Services  CM Consult  Post Acute Care Choice:    Choice offered to:  Patient  DME Arranged:    DME Agency:     HH Arranged:    HH Agency:     Status of Service:  Completed, signed off  If discussed at [employer] Meetings, dates discussed:    Additional Comments:  Kermit BaloKelli F Leory Allinson, RN 08/10/2017, 1:16 PM

## 2017-08-10 NOTE — Plan of Care (Signed)
Ms. Toni [name] still reports discomfort in her lower back, left groin and "skull" which she believes is a migraine.  She is requesting her PRN pain medications at or before their due time.  The patient ambulates with a walker, bathed herself and dressed herself this morning.    We anticipate discharge to SNF or outpatient rehab facility--possibly as early as today.  In the meantime, nursing POC will continue to be pain management, assistance safely performing ADLs and psychosocial support.

## 2017-08-10 NOTE — Discharge Summary (Addendum)
Discharge Summary  Toni [name] [facility] HQI:[id number] DOB: 14-Jan-1955  PCP: Madelin Headings, MD  Admit date: 08/05/2017 Discharge date: 08/10/2017  Time spent: , more than 50% time spent on coordination of care.  Recommendations for Outpatient Follow-up:  1. F/u with SNF MD  for hospital discharge follow up, repeat cbc/bmp at follow up  Discharge Diagnoses:  Active Hospital Problems   Diagnosis Date Noted  . Left groin pain 08/05/2017  . Generalized anxiety disorder 10/08/2014    Resolved Hospital Problems  No resolved problems to display.    Discharge Condition: stable  Diet recommendation: regular diet  Filed Weights   08/08/17 0245 08/09/17 0423 08/10/17 0301  Weight: 94.2 kg 96.4 kg 96.3 kg    History of present illness: (per admitting MD Dr Selena Batten) [name]  is a 62 y.o. female, w Hepatitis C, Depression, Migraines, Cervicalgia, who presents with c/o left groin pain since fall on Thursday.  Pt was evaluated in ER 8/2, and then again earlier on 8/3.   In ED,  CT left hip  IMPRESSION: 1. No evidence of acute fracture or dislocation at the left hip. Previously questioned fracture of the left superior pubic ramus demonstrates no definite CT correlate. Image quality is degraded by body habitus. 2. Left hip degenerative changes. No significant joint effusion. 3. No periarticular hematoma.  Wbc 6.2, Hgb 12.5, Plt 189 Na 140, K 3.9, Bun 14, Creatinine 0.74  Asked by ER to evaluate the patient for left groin pain.   Hospital Course:  Principal Problem:   Left groin pain Active Problems:   Generalized anxiety disorder   left superior and inferior pubic rami fracture.  -CT hip negative for fracture.  -MRI confirms the presence of nondisplaced acute fractures of the left superior and inferior pubic rami. -MRI lumbar spine; spondylosis.  -Continue with pain management.   Multiples Ribs fracture: There are mildly displaced fractures of the  anterolateral aspects of the left eighth, ninth, and tenth ribs.  Incentive spirometry.  Pain management.  Fifth metatarsal fracture ortho to give rec for ortho shoes   Depression;  Continue with meds Resume dextroamphetamine. Patient will need to bring home supply.  Migraine; continue with Imitrex.   FTT/fall PT eval  Code Status: full  Family Communication: patient   Disposition Plan:  snf   Consultants:  cir  Procedures:  none  Antibiotics:  none   Discharge Exam: BP 122/74 (BP Location: Left Arm)   Pulse 68   Temp 98.3 F (36.8 C) (Oral)   Resp 15   Ht 5\' 3"  (1.6 m)   Wt 96.3 kg   SpO2 96%   BMI 37.61 kg/m   General: NAD Cardiovascular: RRR Respiratory: CTABL  Discharge Instructions You were cared for by a hospitalist during your hospital stay. If you have any questions about your discharge medications or the care you received while you were in the hospital after you are discharged, you can call the unit and asked to speak with the hospitalist on call if the hospitalist that took care of you is not available. Once you are discharged, your primary care physician will handle any further medical issues. Please note that NO REFILLS for any discharge medications will be authorized once you are discharged, as it is imperative that you return to your primary care physician (or establish a relationship with a primary care physician if you do not have one) for your aftercare needs so that they can reassess your need for medications and monitor your  lab values.  Discharge Instructions    Diet general   Complete by:  As directed    Increase activity slowly   Complete by:  As directed      Allergies as of 08/10/2017      Reactions   Codeine Nausea And Vomiting, Other (See Comments)   Shuts down GI tract      Medication List    STOP taking these medications   cyclobenzaprine 10 MG tablet Commonly known as:  FLEXERIL   eletriptan 40 MG  tablet Commonly known as:  RELPAX   HYDROcodone-ibuprofen 7.5-200 MG tablet Commonly known as:  VICOPROFEN     TAKE these medications   albuterol 108 (90 Base) MCG/ACT inhaler Commonly known as:  PROVENTIL HFA;VENTOLIN HFA Inhale 2 puffs into the lungs every 6 (six) hours as needed for wheezing or shortness of breath.   ALPRAZolam 1 MG tablet Commonly known as:  XANAX Take 1 tablet (1 mg total) by mouth 4 (four) times daily as needed for anxiety (shakes).   alum hydroxide-mag trisilicate 80-20 MG Chew chewable tablet Commonly known as:  GAVISCON Chew 2 tablets by mouth as needed for indigestion or heartburn.   CENTRUM SILVER ADULT 50+ Tabs Take 1 tablet by mouth daily.   cetirizine 10 MG tablet Commonly known as:  ZYRTEC Take 10 mg by mouth daily as needed for allergies.   desvenlafaxine 50 MG 24 hr tablet Commonly known as:  PRISTIQ Take 50 mg by mouth daily.   dextroamphetamine 15 MG 24 hr capsule Commonly known as:  DEXEDRINE SPANSULE Take 30-45 mg by mouth daily.   estradiol 0.1 MG/GM vaginal cream Commonly known as:  ESTRACE Place 1 Applicatorful vaginally daily as needed.   ESTROVEN + ENERGY MAX STRENGTH Tabs Take by mouth daily.   FLUoxetine 20 MG capsule Commonly known as:  PROZAC Take 1 capsule by mouth daily.   gabapentin 600 MG tablet Commonly known as:  NEURONTIN Take 600 mg by mouth 3 (three) times daily.   ketoconazole 2 % cream Commonly known as:  NIZORAL APPLY TO AFFECTED AREA TWICE A DAY   levothyroxine 25 MCG tablet Commonly known as:  SYNTHROID, LEVOTHROID Take 1 tablet (25 mcg total) by mouth daily before breakfast. Take on an empty stomach   methocarbamol 500 MG tablet Commonly known as:  ROBAXIN Take 1 tablet (500 mg total) by mouth every 6 (six) hours as needed for muscle spasms.   oxyCODONE 5 MG immediate release tablet Commonly known as:  Oxy IR/ROXICODONE Take 1-2 tablets (5-10 mg total) by mouth every 6 (six) hours as needed  for moderate pain.   polyethylene glycol packet Commonly known as:  MIRALAX / GLYCOLAX Take 17 g by mouth daily. Start taking on:  08/11/2017   SUMAtriptan 25 MG tablet Commonly known as:  IMITREX Take 1 tablet (25 mg total) by mouth as needed for migraine. May repeat in 2 hours if headache persists or recurs.            Durable Medical Equipment  (From admission, onward)         Start     Ordered   08/08/17 1125  For home use only DME 3 n 1  Once     08/08/17 1124   08/08/17 1125  For home use only DME Walker rolling  Once    Question:  Patient needs a walker to treat with the following condition  Answer:  Pubic ramus fracture (HCC)   08/08/17 1124  Allergies  Allergen Reactions  . Codeine Nausea And Vomiting and Other (See Comments)    Shuts down GI tract   Contact information for after-discharge care    Destination    [facility] Preferred SNF .   Service:  Skilled Nursing Contact information: [address] [place] [address] [phone]               The results of significant diagnostics from this hospitalization (including imaging, microbiology, ancillary and laboratory) are listed below for reference.    Significant Diagnostic Studies: Dg Ribs Unilateral Left  Result Date: 08/07/2017 CLINICAL DATA:  Recent fall with pelvic fracture. Possible left ribcage injury. EXAM: LEFT RIBS - 2 VIEW COMPARISON:  Chest x-ray of January 26, 2016 FINDINGS: The lungs are adequately inflated. There is no focal infiltrate. There is no pleural effusion or pneumothorax. The heart is normal in size. The pulmonary vascularity is not engorged. There is minimal linear increased density projecting just above the left lateral costophrenic angle. There are fractures of the lateral aspects of the left eighth, ninth, and tenth ribs. IMPRESSION: There are mildly displaced fractures of the anterolateral aspects of the left eighth, ninth, and  tenth ribs. There is no pleural effusion or pneumothorax but minimal subsegmental atelectasis just above the left lateral costophrenic angle is observed. Otherwise no acute cardiopulmonary abnormality. Electronically Signed   By: David  [place] M.D.   On: 08/07/2017 13:46   Mr Lumbar Spine Wo Contrast  Result Date: 08/06/2017 CLINICAL DATA:  Left groin and buttock pain since falling 3 days ago. Inability to ambulate. History of hepatitis and migraine headaches. EXAM: MRI LUMBAR SPINE WITHOUT CONTRAST TECHNIQUE: Multiplanar, multisequence MR imaging of the lumbar spine was performed. No intravenous contrast was administered. COMPARISON:  Lumbar spine radiographs 08/09/2016 and 04/10/2013 (no report). FINDINGS: Segmentation: Conventional anatomy assumed, with the last open disc space designated L5-S1.This is concordant with prior radiographs. Alignment: Moderate convex left scoliosis centered at L2-3. The lateral alignment is near anatomic. Vertebrae: No evidence of acute fracture or worrisome osseous lesion in the lumbar spine. There are scattered endplate degenerative changes. There are prominent facet degenerative changes inferiorly. The left L5 pars interarticularis is not well visualized and there may be a unilateral pars defect. The left 11th rib demonstrates marrow edema and surrounding soft tissue edema, best seen on the sagittal inversion recovery images (images 15 through 18/10). This is not included on the axial images, but likely represents a fracture. The visualized sacroiliac joints appear unremarkable. Conus medullaris: Extends to the L1 level and appears normal. Paraspinal and other soft tissues: No significant paraspinal findings. Disc levels: Sagittal images demonstrate mild disc bulging and endplate degenerative changes at T11-12 and T12-L1. No resulting spinal stenosis or nerve root encroachment. L1-2: Minimal disc bulging and facet hypertrophy. No spinal stenosis or nerve root encroachment.  L2-3: Mild disc bulging with moderate facet and ligamentous hypertrophy. Borderline spinal stenosis. No nerve root encroachment. L3-4: Mild disc bulging. Moderate facet and ligamentous hypertrophy. These factors contribute to mild spinal stenosis and mild narrowing of the right lateral recess. No foraminal compromise or definite nerve root encroachment. L4-5: Mild disc bulging with right foraminal annular rent. There is moderate facet and ligamentous hypertrophy. These factors contribute to mild spinal stenosis and mild narrowing of the lateral recesses. There is also mild narrowing of the superior left foramen without definite nerve root encroachment. L5-S1: Mild disc bulging with moderate to advanced bilateral facet hypertrophy. Mild left foraminal narrowing.  IMPRESSION: 1. Moderate convex left scoliosis. Possible unilateral left-sided pars defect at L5-S1. 2. No high-grade spinal stenosis or definite nerve root encroachment identified. There is mild left-sided foraminal narrowing at L4-5 and L5-S1. There is mild multifactorial spinal stenosis at L3-4 and L4-5. 3. Probable fracture of the left 11th rib. Plain film correlation recommended. Electronically Signed   By: Carey BullocksWilliam  Veazey M.D.   On: 08/06/2017 14:29   Ct Hip Left Wo Contrast  Result Date: 08/05/2017 CLINICAL DATA:  Left groin pain with limited weight-bearing since falling 2 days ago. EXAM: CT OF THE LEFT HIP WITHOUT CONTRAST TECHNIQUE: Multidetector CT imaging of the left hip was performed according to the standard protocol. Multiplanar CT image reconstructions were also generated. COMPARISON:  Left hip radiographs 08/04/2017. FINDINGS: Bones/Joint/Cartilage Examination is limited to the left hip and inferior left hemipelvis. Image quality is suboptimal, in part secondary to body habitus. No acute fracture or dislocation is demonstrated. There are mild left hip degenerative changes with osteophytes of the femoral neck and subchondral cyst formation  anteriorly in the acetabulum. No definite abnormality of the left superior pubic ramus as questioned on prior radiographs. No significant hip joint effusion. The sacroiliac joints are not imaged. Ligaments Not relevant for exam/indication. Muscles and Tendons Unremarkable. Soft tissues No focal hematoma or other fluid collection demonstrated. IMPRESSION: 1. No evidence of acute fracture or dislocation at the left hip. Previously questioned fracture of the left superior pubic ramus demonstrates no definite CT correlate. Image quality is degraded by body habitus. 2. Left hip degenerative changes.  No significant joint effusion. 3. No periarticular hematoma. Electronically Signed   By: Carey BullocksWilliam  Veazey M.D.   On: 08/05/2017 17:25   Mr Hip Left Wo Contrast  Result Date: 08/06/2017 CLINICAL DATA:  Left hip and groin pain.  Fracture suspected. EXAM: MR OF THE LEFT HIP WITHOUT CONTRAST TECHNIQUE: Multiplanar, multisequence MR imaging was performed. No intravenous contrast was administered. COMPARISON:  Radiographs 08/04/2017.  CT 08/05/2017. FINDINGS: Bones: There is prominent bone marrow edema medially in the left superior pubic ramus, corresponding with the originally questioned nondisplaced fracture. Fracture line is apparent on the coronal images. There is also a nondisplaced fracture of the left inferior pubic ramus. No evidence of proximal femur fracture or dislocation. Coronal images include the entire pelvis and demonstrate no right pelvic fracture or diastasis of the symphysis pubis or sacroiliac joints. Articular cartilage and labrum Articular cartilage: Mild left hip degenerative changes with subchondral cyst formation anteriorly in the acetabulum. Labrum: There is no gross labral tear or paralabral abnormality. Joint or bursal effusion Joint effusion: No significant hip joint effusion. Bursae: No focal periarticular fluid collection. Muscles and tendons Muscles and tendons: There is prominent edema throughout  the left hip adductor musculature adjacent to the pubic rami fractures. There is no focal hematoma. Edema extends into the perivesical fat, but there is no focal fluid collection or ascites to suggest bladder injury. There is mild gluteus tendinosis bilaterally. The common hamstring and iliopsoas tendons appear intact. Other findings Miscellaneous: The visualized internal pelvic contents otherwise appear unremarkable. IMPRESSION: 1. MRI confirms the presence of nondisplaced acute fractures of the left superior and inferior pubic rami. There is associated marrow and surrounding soft tissue edema. These fractures are occult on CT. 2. No evidence of proximal left femur fracture or dislocation. Mild left hip degenerative changes. Electronically Signed   By: Carey BullocksWilliam  Veazey M.D.   On: 08/06/2017 13:54   Dg Hip Unilat W Or Wo Pelvis 2-3 Views  Left  Result Date: 08/04/2017 CLINICAL DATA:  Felt 12 hours ago.  LEFT groin pain. EXAM: DG HIP (WITH OR WITHOUT PELVIS) 2-3V LEFT COMPARISON:  None. FINDINGS: Femoral heads are well formed and located. Faint linear lucency projects LEFT superior pubic ramus only on frontal radiograph. Hip joint spaces are intact. Mild LEFT femoral head spurring compatible with osteoarthrosis. Sacroiliac joints are symmetric. No destructive bony lesions. Included soft tissue planes are non-suspicious. IMPRESSION: Linear lucency superior pubic ramus seen on single view favored as artifact. Mild LEFT hip osteoarthrosis. Electronically Signed   By: Awilda Metro M.D.   On: 08/04/2017 02:42    Microbiology: No results found for this or any previous visit (from the past 240 hour(s)).   Labs: Basic Metabolic Panel: Recent Labs  Lab 08/05/17 1941 08/06/17 0541  NA 140 140  K 3.9 3.7  CL 102 105  CO2 30 27  GLUCOSE 98 98  BUN 14 17  CREATININE 0.74 0.78  CALCIUM 9.0 8.7*   Liver Function Tests: Recent Labs  Lab 08/06/17 0541  AST 19  ALT 20  ALKPHOS 68  BILITOT 0.4  PROT  6.3*  ALBUMIN 3.2*   No results for input(s): LIPASE, AMYLASE in the last 168 hours. No results for input(s): AMMONIA in the last 168 hours. CBC: Recent Labs  Lab 08/05/17 1941 08/06/17 0541 08/08/17 1206  WBC 6.2 5.4 6.3  HGB 12.5 11.5* 12.3  HCT 41.3 37.6 39.3  MCV 91.6 89.5 89.5  PLT 189 187 206   Cardiac Enzymes: No results for input(s): CKTOTAL, CKMB, CKMBINDEX, TROPONINI in the last 168 hours. BNP: BNP (last 3 results) No results for input(s): BNP in the last 8760 hours.  ProBNP (last 3 results) No results for input(s): PROBNP in the last 8760 hours.  CBG: No results for input(s): GLUCAP in the last 168 hours.     Signed:  Albertine Grates MD, PhD  [facility] 08/10/2017, 12:35 PM

## 2017-08-11 ENCOUNTER — Encounter: Payer: Self-pay | Admitting: Internal Medicine

## 2017-08-11 ENCOUNTER — Non-Acute Institutional Stay (SKILLED_NURSING_FACILITY): Payer: 59 | Admitting: Internal Medicine

## 2017-08-11 DIAGNOSIS — R1032 Left lower quadrant pain: Secondary | ICD-10-CM | POA: Diagnosis not present

## 2017-08-11 DIAGNOSIS — F321 Major depressive disorder, single episode, moderate: Secondary | ICD-10-CM

## 2017-08-11 DIAGNOSIS — S92355S Nondisplaced fracture of fifth metatarsal bone, left foot, sequela: Secondary | ICD-10-CM | POA: Diagnosis not present

## 2017-08-11 DIAGNOSIS — IMO0002 Reserved for concepts with insufficient information to code with codable children: Secondary | ICD-10-CM

## 2017-08-11 DIAGNOSIS — E038 Other specified hypothyroidism: Secondary | ICD-10-CM

## 2017-08-11 DIAGNOSIS — E039 Hypothyroidism, unspecified: Secondary | ICD-10-CM

## 2017-08-11 DIAGNOSIS — F411 Generalized anxiety disorder: Secondary | ICD-10-CM

## 2017-08-11 DIAGNOSIS — S2242XS Multiple fractures of ribs, left side, sequela: Secondary | ICD-10-CM

## 2017-08-11 DIAGNOSIS — S32592D Other specified fracture of left pubis, subsequent encounter for fracture with routine healing: Secondary | ICD-10-CM | POA: Diagnosis not present

## 2017-08-11 DIAGNOSIS — G894 Chronic pain syndrome: Secondary | ICD-10-CM

## 2017-08-11 DIAGNOSIS — G43709 Chronic migraine without aura, not intractable, without status migrainosus: Secondary | ICD-10-CM

## 2017-08-11 NOTE — Telephone Encounter (Signed)
FYI - please advise.  Thank you!

## 2017-08-11 NOTE — Progress Notes (Signed)
Location:  [facility] Room Number: [phone] Place of Service:  SNF [phone])  Randon Goldsmith. Lyn Hollingshead, MD  Patient Care Team: Madelin Headings, MD as PCP - General (Internal Medicine) Shirlean Kelly, MD as Consulting Physician (Neurosurgery) Jethro Bolus, MD as Consulting Physician (Urology) Milagros Evener, MD as Consulting Physician (Psychiatry) Van Clines, MD as Consulting Physician (Neurology)  Extended Emergency Contact Information Primary Emergency Contact: Derry Lory [place] Mobile Phone: [phone] Relation: Significant other     Allergies: Codeine  Chief Complaint  Patient presents with  . New Admit To SNF    Admit to [facility]    HPI: Patient is 62 y.o. female hepatitis C, treated, depression, migraines, cephalgia, who presented to [facility] ED on 8/2 complaining of left groin pain since a fall several days prior..  Patient was evaluated in the ED D on 8/2 where a CT of the left hip showed no fracture and patient was again awaited then admitted to the hospital on 8/3-8 after an MRI showed the presence of nondisplaced acute fracture of the left superior and inferior pubic rami, along with mildly displaced fractures of the anterior lateral aspects of the left eighth ninth and 10th ribs and fifth metatarsal fracture which patient said occurred in June for which she wears a special shoe.  Patient is seen by pain clinic for chronic pain in multiple areas.  Patient was stable and admitted to skilled nursing facility for OT/PT.  While at skilled nursing facility patient will be followed for anxiety treated with Xanax, depression treated with Prozac, and Dexedrine and hypothyroidism treated with replacement.  Past Medical History:  Diagnosis Date  . Acute hepatitis C without mention of hepatic coma(070.51)    No treatment  . Brachial neuritis or radiculitis NOS   . Depressive disorder, not elsewhere classified   .  Essential and other specified forms of tremor   . Headache(784.0)   . Memory loss   . Microscopic hematuria    seed dr Patsi Sears in past  . Migraine, unspecified, without mention of intractable migraine without mention of status migrainosus   . Other specified disorders of rotator cuff syndrome of shoulder and allied disorders   . Vulvar pain    rx with valium supp    Past Surgical History:  Procedure Laterality Date  . BREAST EXCISIONAL BIOPSY Right    No scar  . CESAREAN SECTION     x3  . SHOULDER ARTHROSCOPY W/ ROTATOR CUFF REPAIR Right 2004  . SHOULDER ARTHROSCOPY W/ ROTATOR CUFF REPAIR Left 2008    Allergies as of 08/11/2017      Reactions   Codeine Nausea And Vomiting, Other (See Comments)   Shuts down GI tract      Medication List        Accurate as of 08/11/17 12:52 PM. Always use your most recent med list.          albuterol 108 (90 Base) MCG/ACT inhaler Commonly known as:  PROVENTIL HFA;VENTOLIN HFA Inhale 2 puffs into the lungs every 6 (six) hours as needed for wheezing or shortness of breath.   ALPRAZolam 1 MG tablet Commonly known as:  XANAX Take 1 tablet (1 mg total) by mouth 4 (four) times daily as needed for anxiety (shakes).   alum hydroxide-mag trisilicate 80-20 MG Chew chewable tablet Commonly known as:  GAVISCON Chew 2 tablets by mouth as needed for indigestion or heartburn.   CENTRUM SILVER ADULT 50+  Tabs Take 1 tablet by mouth daily.   cetirizine 10 MG tablet Commonly known as:  ZYRTEC Take 10 mg by mouth daily as needed for allergies.   desvenlafaxine 50 MG 24 hr tablet Commonly known as:  PRISTIQ Take 50 mg by mouth daily.   dextroamphetamine 15 MG 24 hr capsule Commonly known as:  DEXEDRINE SPANSULE Take 30-45 mg by mouth daily.   estradiol 0.1 MG/GM vaginal cream Commonly known as:  ESTRACE Place 1 Applicatorful vaginally daily as needed.   ESTROVEN + ENERGY MAX STRENGTH Tabs Take by mouth daily.   FLUoxetine 20 MG  capsule Commonly known as:  PROZAC Take 1 capsule by mouth daily.   gabapentin 600 MG tablet Commonly known as:  NEURONTIN Take 600 mg by mouth 3 (three) times daily.   ketoconazole 2 % cream Commonly known as:  NIZORAL APPLY TO AFFECTED AREA TWICE A DAY   levothyroxine 25 MCG tablet Commonly known as:  SYNTHROID, LEVOTHROID Take 1 tablet (25 mcg total) by mouth daily before breakfast. Take on an empty stomach   methocarbamol 500 MG tablet Commonly known as:  ROBAXIN Take 1 tablet (500 mg total) by mouth every 6 (six) hours as needed for muscle spasms.   oxyCODONE 5 MG immediate release tablet Commonly known as:  Oxy IR/ROXICODONE Take 1-2 tablets (5-10 mg total) by mouth every 6 (six) hours as needed for moderate pain.   polyethylene glycol packet Commonly known as:  MIRALAX / GLYCOLAX Take 17 g by mouth daily.   SUMAtriptan 25 MG tablet Commonly known as:  IMITREX Take 1 tablet (25 mg total) by mouth as needed for migraine. May repeat in 2 hours if headache persists or recurs.       No orders of the defined types were placed in this encounter.   Immunization History  Administered Date(s) Administered  . Hep A / Hep B 12/15/2014, 01/16/2015, 06/22/2015  . Influenza,inj,Quad PF,6+ Mos 10/14/2014, 09/21/2015, 10/06/2016  . Td 07/19/2017    Social History   Tobacco Use  . Smoking status: Former Smoker    Types: Cigarettes    Last attempt to quit: 05/23/1978    Years since quitting: 39.2  . Smokeless tobacco: Never Used  Substance Use Topics  . Alcohol use: No    Alcohol/week: 0.0 standard drinks    Family history is   Family History  Problem Relation Age of Onset  . Headache Mother   . Stroke Father        father passed away june17  . Diabetes Maternal Grandmother   . Other Son        Mother is deceased Jun 03, 2011 father moved to the area 06-02-2012.      Review of Systems  DATA OBTAINED: from patient, nurse, medical record, family member GENERAL:  no  fevers, fatigue, appetite changes SKIN: No itching, or rash EYES: No eye pain, redness, discharge EARS: No earache, tinnitus, change in hearing NOSE: No congestion, drainage or bleeding  MOUTH/THROAT: No mouth or tooth pain, No sore throat RESPIRATORY: No cough, wheezing, SOB CARDIAC: No chest pain, palpitations, lower extremity edema  GI: No abdominal pain, No N/V/D or constipation, No heartburn or reflux  GU: No dysuria, frequency or urgency, or incontinence  MUSCULOSKELETAL: + unrelieved bone/joint pain NEUROLOGIC: No headache, dizziness or focal weakness PSYCHIATRIC: +anxiety or sadness   Vitals:   08/11/17 1218  BP: 105/67  Pulse: 84  Resp: 16  Temp: (!) 97.4 F (36.3 C)    SpO2 Readings from  Last 1 Encounters:  08/10/17 94%   Body mass index is 37.55 kg/m.     Physical Exam  GENERAL APPEARANCE: Alert, conversant,  No acute distress.  SKIN: No diaphoresis rash HEAD: Normocephalic, atraumatic  EYES: Conjunctiva/lids clear. Pupils round, reactive. EOMs intact.  EARS: External exam WNL, canals clear. Hearing grossly normal.  NOSE: No deformity or discharge.  MOUTH/THROAT: Lips w/o lesions  RESPIRATORY: Breathing is even, unlabored. Lung sounds are clear   CARDIOVASCULAR: Heart RRR no murmurs, rubs or gallops. No peripheral edema.   GASTROINTESTINAL: Abdomen is soft, non-tender, not distended w/ normal bowel sounds. GENITOURINARY: Bladder non tender, not distended  MUSCULOSKELETAL: No abnormal joints or musculature NEUROLOGIC:  Cranial nerves 2-12 grossly intact. Moves all extremities  PSYCHIATRIC: Mood and affect with large anxiety, no behavioral issues  Patient Active Problem List   Diagnosis Date Noted  . Left groin pain 08/05/2017  . Occipital neuralgia of right side 10/21/2016  . Chronic hepatitis C (HCC) 10/05/2015  . Chronic migraine 01/26/2015  . Persistent headaches 12/16/2014  . Subclinical hypothyroidism 12/16/2014  . Memory loss 10/08/2014  .  Depression 10/08/2014  . Generalized anxiety disorder 10/08/2014  . Tremor 10/08/2014  . Major depressive disorder, single episode, moderate (HCC) 08/27/2014  . Memory difficulties 08/27/2014  . Degenerative disc disease, cervical 08/27/2014  . Chronically on opiate therapy 08/27/2014      Labs reviewed: Basic Metabolic Panel:    Component Value Date/Time   NA 140 08/06/2017 0541   K 3.7 08/06/2017 0541   CL 105 08/06/2017 0541   CO2 27 08/06/2017 0541   GLUCOSE 98 08/06/2017 0541   BUN 17 08/06/2017 0541   CREATININE 0.78 08/06/2017 0541   CALCIUM 8.7 (L) 08/06/2017 0541   PROT 6.3 (L) 08/06/2017 0541   ALBUMIN 3.2 (L) 08/06/2017 0541   AST 19 08/06/2017 0541   ALT 20 08/06/2017 0541   ALKPHOS 68 08/06/2017 0541   BILITOT 0.4 08/06/2017 0541   GFRNONAA >60 08/06/2017 0541   GFRAA >60 08/06/2017 0541    Recent Labs    02/28/17 1139 08/05/17 1941 08/06/17 0541  NA 136 140 140  K 4.4 3.9 3.7  CL 98 102 105  CO2 32 30 27  GLUCOSE 90 98 98  BUN 26* 14 17  CREATININE 0.87 0.74 0.78  CALCIUM 10.0 9.0 8.7*   Liver Function Tests: Recent Labs    02/28/17 1139 08/06/17 0541  AST 21 19  ALT 16 20  ALKPHOS 74 68  BILITOT 0.4 0.4  PROT 7.4 6.3*  ALBUMIN 4.3 3.2*   No results for input(s): LIPASE, AMYLASE in the last 8760 hours. No results for input(s): AMMONIA in the last 8760 hours. CBC: Recent Labs    02/28/17 1139 08/05/17 1941 08/06/17 0541 08/08/17 1206  WBC 4.9 6.2 5.4 6.3  NEUTROABS 2.5  --   --   --   HGB 13.5 12.5 11.5* 12.3  HCT 41.3 41.3 37.6 39.3  MCV 86.2 91.6 89.5 89.5  PLT 229.0 189 187 206   Lipid Recent Labs    02/28/17 1139  CHOL 213*  HDL 46.90  LDLCALC 144*  TRIG 113.0    Cardiac Enzymes: No results for input(s): CKTOTAL, CKMB, CKMBINDEX, TROPONINI in the last 8760 hours. BNP: No results for input(s): BNP in the last 8760 hours. No results found for: [facility] Lab Results  Component Value Date   HGBA1C 5.8 02/28/2017    Lab Results  Component Value Date   TSH 2.57 07/14/2017  No results found for: VITAMINB12 No results found for: FOLATE No results found for: IRON, TIBC, FERRITIN  Imaging and Procedures obtained prior to SNF admission: Mr Lumbar Spine Wo Contrast  Result Date: 08/06/2017 CLINICAL DATA:  Left groin and buttock pain since falling 3 days ago. Inability to ambulate. History of hepatitis and migraine headaches. EXAM: MRI LUMBAR SPINE WITHOUT CONTRAST TECHNIQUE: Multiplanar, multisequence MR imaging of the lumbar spine was performed. No intravenous contrast was administered. COMPARISON:  Lumbar spine radiographs 08/09/2016 and 04/10/2013 (no report). FINDINGS: Segmentation: Conventional anatomy assumed, with the last open disc space designated L5-S1.This is concordant with prior radiographs. Alignment: Moderate convex left scoliosis centered at L2-3. The lateral alignment is near anatomic. Vertebrae: No evidence of acute fracture or worrisome osseous lesion in the lumbar spine. There are scattered endplate degenerative changes. There are prominent facet degenerative changes inferiorly. The left L5 pars interarticularis is not well visualized and there may be a unilateral pars defect. The left 11th rib demonstrates marrow edema and surrounding soft tissue edema, best seen on the sagittal inversion recovery images (images 15 through 18/10). This is not included on the axial images, but likely represents a fracture. The visualized sacroiliac joints appear unremarkable. Conus medullaris: Extends to the L1 level and appears normal. Paraspinal and other soft tissues: No significant paraspinal findings. Disc levels: Sagittal images demonstrate mild disc bulging and endplate degenerative changes at T11-12 and T12-L1. No resulting spinal stenosis or nerve root encroachment. L1-2: Minimal disc bulging and facet hypertrophy. No spinal stenosis or nerve root encroachment. L2-3: Mild disc bulging with moderate facet and  ligamentous hypertrophy. Borderline spinal stenosis. No nerve root encroachment. L3-4: Mild disc bulging. Moderate facet and ligamentous hypertrophy. These factors contribute to mild spinal stenosis and mild narrowing of the right lateral recess. No foraminal compromise or definite nerve root encroachment. L4-5: Mild disc bulging with right foraminal annular rent. There is moderate facet and ligamentous hypertrophy. These factors contribute to mild spinal stenosis and mild narrowing of the lateral recesses. There is also mild narrowing of the superior left foramen without definite nerve root encroachment. L5-S1: Mild disc bulging with moderate to advanced bilateral facet hypertrophy. Mild left foraminal narrowing. IMPRESSION: 1. Moderate convex left scoliosis. Possible unilateral left-sided pars defect at L5-S1. 2. No high-grade spinal stenosis or definite nerve root encroachment identified. There is mild left-sided foraminal narrowing at L4-5 and L5-S1. There is mild multifactorial spinal stenosis at L3-4 and L4-5. 3. Probable fracture of the left 11th rib. Plain film correlation recommended. Electronically Signed   By: Carey Bullocks M.D.   On: 08/06/2017 14:29   Ct Hip Left Wo Contrast  Result Date: 08/05/2017 CLINICAL DATA:  Left groin pain with limited weight-bearing since falling 2 days ago. EXAM: CT OF THE LEFT HIP WITHOUT CONTRAST TECHNIQUE: Multidetector CT imaging of the left hip was performed according to the standard protocol. Multiplanar CT image reconstructions were also generated. COMPARISON:  Left hip radiographs 08/04/2017. FINDINGS: Bones/Joint/Cartilage Examination is limited to the left hip and inferior left hemipelvis. Image quality is suboptimal, in part secondary to body habitus. No acute fracture or dislocation is demonstrated. There are mild left hip degenerative changes with osteophytes of the femoral neck and subchondral cyst formation anteriorly in the acetabulum. No definite  abnormality of the left superior pubic ramus as questioned on prior radiographs. No significant hip joint effusion. The sacroiliac joints are not imaged. Ligaments Not relevant for exam/indication. Muscles and Tendons Unremarkable. Soft tissues No focal hematoma or other fluid  collection demonstrated. IMPRESSION: 1. No evidence of acute fracture or dislocation at the left hip. Previously questioned fracture of the left superior pubic ramus demonstrates no definite CT correlate. Image quality is degraded by body habitus. 2. Left hip degenerative changes.  No significant joint effusion. 3. No periarticular hematoma. Electronically Signed   By: Carey Bullocks M.D.   On: 08/05/2017 17:25   Mr Hip Left Wo Contrast  Result Date: 08/06/2017 CLINICAL DATA:  Left hip and groin pain.  Fracture suspected. EXAM: MR OF THE LEFT HIP WITHOUT CONTRAST TECHNIQUE: Multiplanar, multisequence MR imaging was performed. No intravenous contrast was administered. COMPARISON:  Radiographs 08/04/2017.  CT 08/05/2017. FINDINGS: Bones: There is prominent bone marrow edema medially in the left superior pubic ramus, corresponding with the originally questioned nondisplaced fracture. Fracture line is apparent on the coronal images. There is also a nondisplaced fracture of the left inferior pubic ramus. No evidence of proximal femur fracture or dislocation. Coronal images include the entire pelvis and demonstrate no right pelvic fracture or diastasis of the symphysis pubis or sacroiliac joints. Articular cartilage and labrum Articular cartilage: Mild left hip degenerative changes with subchondral cyst formation anteriorly in the acetabulum. Labrum: There is no gross labral tear or paralabral abnormality. Joint or bursal effusion Joint effusion: No significant hip joint effusion. Bursae: No focal periarticular fluid collection. Muscles and tendons Muscles and tendons: There is prominent edema throughout the left hip adductor musculature  adjacent to the pubic rami fractures. There is no focal hematoma. Edema extends into the perivesical fat, but there is no focal fluid collection or ascites to suggest bladder injury. There is mild gluteus tendinosis bilaterally. The common hamstring and iliopsoas tendons appear intact. Other findings Miscellaneous: The visualized internal pelvic contents otherwise appear unremarkable. IMPRESSION: 1. MRI confirms the presence of nondisplaced acute fractures of the left superior and inferior pubic rami. There is associated marrow and surrounding soft tissue edema. These fractures are occult on CT. 2. No evidence of proximal left femur fracture or dislocation. Mild left hip degenerative changes. Electronically Signed   By: Carey Bullocks M.D.   On: 08/06/2017 13:54     Not all labs, radiology exams or other studies done during hospitalization come through on my EPIC note; however they are reviewed by me.    Assessment and Plan  Left superior and inferior pubic rami fracture-CT was negative for fracture, MRI showed presence of nondisplaced acute fracture of the left superior and inferior pubic rami SNF -admitted for OT/PT; continue with pain control with which patient is very interested; patient goes to pain clinic where she receives Vicoprofen 7.5 mg every 6 as needed; patient uses anywhere between 2 and 4 pills a day; since patient is in an acute pain scenario I suggested that we schedule her with oxycodone 10 mg every 6 hours scheduled along with 400 of Motrin every 6 scheduled in addition to Robaxin 500 mg every 6 scheduled in addition to patient's Xanax 1 mg to be gotten with her other meds on a every 6 hours scheduled basis per patient's ex-husband who is her good friend patient has fallen 17 times in the past year, not because she takes pain medicine he thinks but because she has been diagnosed with some organic brain disease that is probably progressing patient has had a change in her personality and  a decrease in her threshold for agitation  Multiple rib fractures-left 08/11/2008 which patient says occurred in June 2019 SNF -see pain regimen above  Left fifth  metatarsal fracture-also occurred in June; patient has past special shoe for it; still causes a great deal of pain SNF -see pain regimen as above  Anxiety SNF -off the charts; continue Xanax 1 mg every 6 which she will get along with her pain meds  Depression SNF -continue with Pristiq 50 mg daily, Prozac 20 mg daily and Dexedrine 15 mg 3 times daily, the last of which is to be brought in to skilled nursing facility by the patient  Hypothyroidism SNF -continue replacement at 25 mcg daily  Migraine headaches; patient to have 25 mg as needed as needed; may repeat in 2 hours if headache persists or recurs   Time spent greater than 45 minutes;> 50% of time with patient was spent reviewing records, labs, tests and studies, counseling and developing plan of care  Thurston Holenne D. Lyn HollingsheadAlexander, MD

## 2017-08-13 ENCOUNTER — Encounter: Payer: Self-pay | Admitting: Internal Medicine

## 2017-08-13 DIAGNOSIS — G8929 Other chronic pain: Secondary | ICD-10-CM | POA: Insufficient documentation

## 2017-08-13 DIAGNOSIS — S2249XA Multiple fractures of ribs, unspecified side, initial encounter for closed fracture: Secondary | ICD-10-CM | POA: Insufficient documentation

## 2017-08-13 DIAGNOSIS — S92353A Displaced fracture of fifth metatarsal bone, unspecified foot, initial encounter for closed fracture: Secondary | ICD-10-CM

## 2017-08-13 DIAGNOSIS — S32599A Other specified fracture of unspecified pubis, initial encounter for closed fracture: Secondary | ICD-10-CM

## 2017-08-13 HISTORY — DX: Multiple fractures of ribs, unspecified side, initial encounter for closed fracture: S22.49XA

## 2017-08-13 HISTORY — DX: Displaced fracture of fifth metatarsal bone, unspecified foot, initial encounter for closed fracture: S92.353A

## 2017-08-13 HISTORY — DX: Other specified fracture of unspecified pubis, initial encounter for closed fracture: S32.599A

## 2017-08-14 LAB — BASIC METABOLIC PANEL
BUN: 29 — AB (ref 4–21)
Creatinine: 0.8 (ref 0.5–1.1)
Glucose: 89
POTASSIUM: 4.8 (ref 3.4–5.3)
SODIUM: 141 (ref 137–147)

## 2017-08-14 LAB — CBC AND DIFFERENTIAL
HEMATOCRIT: 36 (ref 36–46)
HEMOGLOBIN: 11.4 — AB (ref 12.0–16.0)
Platelets: 238 (ref 150–399)
WBC: 5.6

## 2017-08-28 ENCOUNTER — Ambulatory Visit: Payer: 59 | Admitting: Internal Medicine

## 2017-08-29 ENCOUNTER — Encounter: Payer: Self-pay | Admitting: Internal Medicine

## 2017-08-29 ENCOUNTER — Non-Acute Institutional Stay (SKILLED_NURSING_FACILITY): Payer: 59 | Admitting: Internal Medicine

## 2017-08-29 DIAGNOSIS — S92355S Nondisplaced fracture of fifth metatarsal bone, left foot, sequela: Secondary | ICD-10-CM | POA: Diagnosis not present

## 2017-08-29 DIAGNOSIS — R1032 Left lower quadrant pain: Secondary | ICD-10-CM | POA: Diagnosis not present

## 2017-08-29 DIAGNOSIS — S2242XS Multiple fractures of ribs, left side, sequela: Secondary | ICD-10-CM | POA: Diagnosis not present

## 2017-08-29 DIAGNOSIS — G894 Chronic pain syndrome: Secondary | ICD-10-CM

## 2017-08-29 DIAGNOSIS — S32592D Other specified fracture of left pubis, subsequent encounter for fracture with routine healing: Secondary | ICD-10-CM

## 2017-08-29 NOTE — Progress Notes (Signed)
Location:  [facility] Room Number: [phone] Place of Service:  SNF [phone])  Randon Goldsmith. Lyn Hollingshead, MD  Patient Care Team: Madelin Headings, MD as PCP - General (Internal Medicine) Shirlean Kelly, MD as Consulting Physician (Neurosurgery) Jethro Bolus, MD as Consulting Physician (Urology) Milagros Evener, MD as Consulting Physician (Psychiatry) Karel Jarvis Lesle Chris, MD as Consulting Physician (Neurology)  Extended Emergency Contact Information Primary Emergency Contact: Derry Lory [place] Mobile Phone: [phone] Relation: Significant other  Allergies  Allergen Reactions  . Codeine Nausea And Vomiting and Other (See Comments)    Shuts down GI tract    Chief Complaint  Patient presents with  . Discharge Note    Discharge from [facility]    HPI:  62 y.o. female with hepatitis C, treated, depression, migraines, cephalgia, who presented to [facility] ED on 8/2 complaining of left groin pain since a fall several days prior.  Patient was evaluated in the ED where CT of the left hip showed no fracture but MRI showed the presence of a nondisplaced acute fracture of the left superior and inferior pubic rami along with mildly displaced fractures of the anterior lateral aspects of the left ninth and 10th ribs and a fifth metatarsal fracture which patient said occurred in June for which she wears a special shoe.  Patient also says the rib fractures are old.  Pain is seen by [facility] for chronic pain in multiple areas.  Patient was admitted to skilled nursing facility for OT/PT, and is now ready to be discharged home.    Past Medical History:  Diagnosis Date  . Acute hepatitis C without mention of hepatic coma(070.51)    No treatment  . Brachial neuritis or radiculitis NOS   . Depressive disorder, not elsewhere classified   . Essential and other specified forms of tremor   . Headache(784.0)   . Memory loss   . Microscopic hematuria      seed dr Patsi Sears in past  . Migraine, unspecified, without mention of intractable migraine without mention of status migrainosus   . Other specified disorders of rotator cuff syndrome of shoulder and allied disorders   . Vulvar pain    rx with valium supp    Past Surgical History:  Procedure Laterality Date  . BREAST EXCISIONAL BIOPSY Right    No scar  . CESAREAN SECTION     x3  . SHOULDER ARTHROSCOPY W/ ROTATOR CUFF REPAIR Right 2004  . SHOULDER ARTHROSCOPY W/ ROTATOR CUFF REPAIR Left 2008     reports that she quit smoking about 39 years ago. Her smoking use included cigarettes. She has never used smokeless tobacco. She reports that she does not drink alcohol or use drugs. Social History   Socioeconomic History  . Marital status: Divorced    Spouse name: Not on file  . Number of children: 3  . Years of education: Not on file  . Highest education level: Not on file  Occupational History  . Not on file  Social Needs  . Financial resource strain: Not on file  . Food insecurity:    Worry: Not on file    Inability: Not on file  . Transportation needs:    Medical: Not on file    Non-medical: Not on file  Tobacco Use  . Smoking status: Former Smoker    Types: Cigarettes    Last attempt to quit: 05/23/1978    Years since quitting: 39.3  . Smokeless tobacco:  Never Used  Substance and Sexual Activity  . Alcohol use: No    Alcohol/week: 0.0 standard drinks  . Drug use: No  . Sexual activity: Not on file  Lifestyle  . Physical activity:    Days per week: Not on file    Minutes per session: Not on file  . Stress: Not on file  Relationships  . Social connections:    Talks on phone: Not on file    Gets together: Not on file    Attends religious service: Not on file    Active member of club or organization: Not on file    Attends meetings of clubs or organizations: Not on file    Relationship status: Not on file  . Intimate partner violence:    Fear of current or ex  partner: Not on file    Emotionally abused: Not on file    Physically abused: Not on file    Forced sexual activity: Not on file  Other Topics Concern  . 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 On disability for depression and short-term memory problems. Dr. love.   She is gravida 3 para 3   Father passed away  07/07/2022   Pertinent  Health Maintenance Due  Topic Date Due  . INFLUENZA VACCINE  09/12/2017 (Originally 08/03/2017)  . MAMMOGRAM  05/12/2018  . PAP SMEAR  12/30/2018  . COLONOSCOPY  03/29/2026    Medications: Allergies as of 08/29/2017      Reactions   Codeine Nausea And Vomiting, Other (See Comments)   Shuts down GI tract      Medication List        Accurate as of 08/29/17 11:59 PM. Always use your most recent med list.          albuterol 108 (90 Base) MCG/ACT inhaler Commonly known as:  PROVENTIL HFA;VENTOLIN HFA Inhale 2 puffs into the lungs every 6 (six) hours as needed for wheezing or shortness of breath.   ALPRAZolam 1 MG tablet Commonly known as:  XANAX Take 1 tablet (1 mg total) by mouth 4 (four) times daily as needed for anxiety (shakes).   CENTRUM SILVER ADULT 50+ Tabs Take 1 tablet by mouth daily.   cetirizine 10 MG tablet Commonly known as:  ZYRTEC Take 10 mg by mouth daily as needed for allergies.   desvenlafaxine 50 MG 24 hr tablet Commonly known as:  PRISTIQ Take 50 mg by mouth daily.   dextroamphetamine 15 MG 24 hr capsule Commonly known as:  DEXEDRINE SPANSULE Take 30-45 mg by mouth daily. 2 po q am and 1 po q noon   estradiol 0.1 MG/GM vaginal cream Commonly known as:  ESTRACE Place 1 Applicatorful vaginally daily as needed.   ESTROVEN + ENERGY MAX STRENGTH Tabs Take by mouth daily.   FLUoxetine 20 MG capsule Commonly known as:  PROZAC Take 1 capsule by mouth daily.   gabapentin 600 MG  tablet Commonly known as:  NEURONTIN Take 600 mg by mouth. TAKE 2 TABS (1200 MG) PO TID FOR HEEL PAIN   GAVISCON 80-14.2 MG Chew Generic drug:  Alum Hydroxide-Mag Trisilicate Chew by mouth. CHEW 2 TABLETS BY MOUTH BEFORE EACH MEAL AT BEDTIME AS NEEDED FOR INDIGESTION OR HEARTBURN   ibuprofen 200 MG tablet Commonly known as:  ADVIL,MOTRIN Take 200 mg by  mouth every 6 (six) hours as needed. TAKE 2 TABLETS (400MG ) BY MOUTH EVERY 6 HOURS FOR PAIN   ketoconazole 2 % cream Commonly known as:  NIZORAL APPLY TO AFFECTED AREA TWICE A DAY   levothyroxine 25 MCG tablet Commonly known as:  SYNTHROID, LEVOTHROID Take 1 tablet (25 mcg total) by mouth daily before breakfast. Take on an empty stomach   methocarbamol 500 MG tablet Commonly known as:  ROBAXIN Take 1 tablet (500 mg total) by mouth every 6 (six) hours as needed for muscle spasms.   oxyCODONE 10 mg 12 hr tablet Commonly known as:  OXYCONTIN Take 10 mg by mouth. Give 1 tablet by mouth every 6 hours routinely for pain   polyethylene glycol packet Commonly known as:  MIRALAX / GLYCOLAX Take 17 g by mouth daily.   SUMAtriptan 25 MG tablet Commonly known as:  IMITREX Take 1 tablet (25 mg total) by mouth as needed for migraine. May repeat in 2 hours if headache persists or recurs.        Vitals:   08/29/17 1103  BP: 129/80  Pulse: 68  Resp: 20  Temp: (!) 97 F (36.1 C)  Weight: 212 lb (96.2 kg)  Height: 5\' 3"  (1.6 m)   Body mass index is 37.55 kg/m.  Physical Exam  GENERAL APPEARANCE: Alert, conversant. No acute distress.  HEENT: Unremarkable. RESPIRATORY: Breathing is even, unlabored. Lung sounds are clear   CARDIOVASCULAR: Heart RRR no murmurs, rubs or gallops. No peripheral edema.  GASTROINTESTINAL: Abdomen is soft, non-tender, not distended w/ normal bowel sounds.  NEUROLOGIC: Cranial nerves 2-12 grossly intact. Moves all extremities   Labs reviewed: Basic Metabolic Panel: Recent Labs    02/28/17 1139  08/05/17 1941 08/06/17 0541 08/14/17  NA 136 140 140 141  K 4.4 3.9 3.7 4.8  CL 98 102 105  --   CO2 32 30 27  --   GLUCOSE 90 98 98  --   BUN 26* 14 17 29*  CREATININE 0.87 0.74 0.78 0.8  CALCIUM 10.0 9.0 8.7*  --    No results found for: [facility] Liver Function Tests: Recent Labs    02/28/17 1139 08/06/17 0541  AST 21 19  ALT 16 20  ALKPHOS 74 68  BILITOT 0.4 0.4  PROT 7.4 6.3*  ALBUMIN 4.3 3.2*   No results for input(s): LIPASE, AMYLASE in the last 8760 hours. No results for input(s): AMMONIA in the last 8760 hours. CBC: Recent Labs    02/28/17 1139 08/05/17 1941 08/06/17 0541 08/08/17 1206 08/14/17  WBC 4.9 6.2 5.4 6.3 5.6  NEUTROABS 2.5  --   --   --   --   HGB 13.5 12.5 11.5* 12.3 11.4*  HCT 41.3 41.3 37.6 39.3 36  MCV 86.2 91.6 89.5 89.5  --   PLT 229.0 189 187 206 238   Lipid Recent Labs    02/28/17 1139  CHOL 213*  HDL 46.90  LDLCALC 144*  TRIG 113.0   Cardiac Enzymes: No results for input(s): CKTOTAL, CKMB, CKMBINDEX, TROPONINI in the last 8760 hours. BNP: No results for input(s): BNP in the last 8760 hours. CBG: No results for input(s): GLUCAP in the last 8760 hours.  Procedures and Imaging Studies During Stay: Dg Ribs Unilateral Left  Result Date: 08/07/2017 CLINICAL DATA:  Recent fall with pelvic fracture. Possible left ribcage injury. EXAM: LEFT RIBS - 2 VIEW COMPARISON:  Chest x-ray of January 26, 2016 FINDINGS: The lungs are adequately inflated. There is no focal infiltrate.  There is no pleural effusion or pneumothorax. The heart is normal in size. The pulmonary vascularity is not engorged. There is minimal linear increased density projecting just above the left lateral costophrenic angle. There are fractures of the lateral aspects of the left eighth, ninth, and tenth ribs. IMPRESSION: There are mildly displaced fractures of the anterolateral aspects of the left eighth, ninth, and tenth ribs. There is no pleural effusion or pneumothorax but  minimal subsegmental atelectasis just above the left lateral costophrenic angle is observed. Otherwise no acute cardiopulmonary abnormality. Electronically Signed   By: David  [place] M.D.   On: 08/07/2017 13:46   Mr Lumbar Spine Wo Contrast  Result Date: 08/06/2017 CLINICAL DATA:  Left groin and buttock pain since falling 3 days ago. Inability to ambulate. History of hepatitis and migraine headaches. EXAM: MRI LUMBAR SPINE WITHOUT CONTRAST TECHNIQUE: Multiplanar, multisequence MR imaging of the lumbar spine was performed. No intravenous contrast was administered. COMPARISON:  Lumbar spine radiographs 08/09/2016 and 04/10/2013 (no report). FINDINGS: Segmentation: Conventional anatomy assumed, with the last open disc space designated L5-S1.This is concordant with prior radiographs. Alignment: Moderate convex left scoliosis centered at L2-3. The lateral alignment is near anatomic. Vertebrae: No evidence of acute fracture or worrisome osseous lesion in the lumbar spine. There are scattered endplate degenerative changes. There are prominent facet degenerative changes inferiorly. The left L5 pars interarticularis is not well visualized and there may be a unilateral pars defect. The left 11th rib demonstrates marrow edema and surrounding soft tissue edema, best seen on the sagittal inversion recovery images (images 15 through 18/10). This is not included on the axial images, but likely represents a fracture. The visualized sacroiliac joints appear unremarkable. Conus medullaris: Extends to the L1 level and appears normal. Paraspinal and other soft tissues: No significant paraspinal findings. Disc levels: Sagittal images demonstrate mild disc bulging and endplate degenerative changes at T11-12 and T12-L1. No resulting spinal stenosis or nerve root encroachment. L1-2: Minimal disc bulging and facet hypertrophy. No spinal stenosis or nerve root encroachment. L2-3: Mild disc bulging with moderate facet and ligamentous  hypertrophy. Borderline spinal stenosis. No nerve root encroachment. L3-4: Mild disc bulging. Moderate facet and ligamentous hypertrophy. These factors contribute to mild spinal stenosis and mild narrowing of the right lateral recess. No foraminal compromise or definite nerve root encroachment. L4-5: Mild disc bulging with right foraminal annular rent. There is moderate facet and ligamentous hypertrophy. These factors contribute to mild spinal stenosis and mild narrowing of the lateral recesses. There is also mild narrowing of the superior left foramen without definite nerve root encroachment. L5-S1: Mild disc bulging with moderate to advanced bilateral facet hypertrophy. Mild left foraminal narrowing. IMPRESSION: 1. Moderate convex left scoliosis. Possible unilateral left-sided pars defect at L5-S1. 2. No high-grade spinal stenosis or definite nerve root encroachment identified. There is mild left-sided foraminal narrowing at L4-5 and L5-S1. There is mild multifactorial spinal stenosis at L3-4 and L4-5. 3. Probable fracture of the left 11th rib. Plain film correlation recommended. Electronically Signed   By: Carey Bullocks M.D.   On: 08/06/2017 14:29   Ct Hip Left Wo Contrast  Result Date: 08/05/2017 CLINICAL DATA:  Left groin pain with limited weight-bearing since falling 2 days ago. EXAM: CT OF THE LEFT HIP WITHOUT CONTRAST TECHNIQUE: Multidetector CT imaging of the left hip was performed according to the standard protocol. Multiplanar CT image reconstructions were also generated. COMPARISON:  Left hip radiographs 08/04/2017. FINDINGS: Bones/Joint/Cartilage Examination is limited to the left hip and inferior left  hemipelvis. Image quality is suboptimal, in part secondary to body habitus. No acute fracture or dislocation is demonstrated. There are mild left hip degenerative changes with osteophytes of the femoral neck and subchondral cyst formation anteriorly in the acetabulum. No definite abnormality of the  left superior pubic ramus as questioned on prior radiographs. No significant hip joint effusion. The sacroiliac joints are not imaged. Ligaments Not relevant for exam/indication. Muscles and Tendons Unremarkable. Soft tissues No focal hematoma or other fluid collection demonstrated. IMPRESSION: 1. No evidence of acute fracture or dislocation at the left hip. Previously questioned fracture of the left superior pubic ramus demonstrates no definite CT correlate. Image quality is degraded by body habitus. 2. Left hip degenerative changes.  No significant joint effusion. 3. No periarticular hematoma. Electronically Signed   By: Carey Bullocks M.D.   On: 08/05/2017 17:25   Mr Hip Left Wo Contrast  Result Date: 08/06/2017 CLINICAL DATA:  Left hip and groin pain.  Fracture suspected. EXAM: MR OF THE LEFT HIP WITHOUT CONTRAST TECHNIQUE: Multiplanar, multisequence MR imaging was performed. No intravenous contrast was administered. COMPARISON:  Radiographs 08/04/2017.  CT 08/05/2017. FINDINGS: Bones: There is prominent bone marrow edema medially in the left superior pubic ramus, corresponding with the originally questioned nondisplaced fracture. Fracture line is apparent on the coronal images. There is also a nondisplaced fracture of the left inferior pubic ramus. No evidence of proximal femur fracture or dislocation. Coronal images include the entire pelvis and demonstrate no right pelvic fracture or diastasis of the symphysis pubis or sacroiliac joints. Articular cartilage and labrum Articular cartilage: Mild left hip degenerative changes with subchondral cyst formation anteriorly in the acetabulum. Labrum: There is no gross labral tear or paralabral abnormality. Joint or bursal effusion Joint effusion: No significant hip joint effusion. Bursae: No focal periarticular fluid collection. Muscles and tendons Muscles and tendons: There is prominent edema throughout the left hip adductor musculature adjacent to the pubic rami  fractures. There is no focal hematoma. Edema extends into the perivesical fat, but there is no focal fluid collection or ascites to suggest bladder injury. There is mild gluteus tendinosis bilaterally. The common hamstring and iliopsoas tendons appear intact. Other findings Miscellaneous: The visualized internal pelvic contents otherwise appear unremarkable. IMPRESSION: 1. MRI confirms the presence of nondisplaced acute fractures of the left superior and inferior pubic rami. There is associated marrow and surrounding soft tissue edema. These fractures are occult on CT. 2. No evidence of proximal left femur fracture or dislocation. Mild left hip degenerative changes. Electronically Signed   By: Carey Bullocks M.D.   On: 08/06/2017 13:54   Dg Hip Unilat W Or Wo Pelvis 2-3 Views Left  Result Date: 08/04/2017 CLINICAL DATA:  Felt 12 hours ago.  LEFT groin pain. EXAM: DG HIP (WITH OR WITHOUT PELVIS) 2-3V LEFT COMPARISON:  None. FINDINGS: Femoral heads are well formed and located. Faint linear lucency projects LEFT superior pubic ramus only on frontal radiograph. Hip joint spaces are intact. Mild LEFT femoral head spurring compatible with osteoarthrosis. Sacroiliac joints are symmetric. No destructive bony lesions. Included soft tissue planes are non-suspicious. IMPRESSION: Linear lucency superior pubic ramus seen on single view favored as artifact. Mild LEFT hip osteoarthrosis. Electronically Signed   By: Awilda Metro M.D.   On: 08/04/2017 02:42    Assessment/Plan:   Closed fracture of multiple rami of left pubis with routine healing, subsequent encounter  Left groin pain  Closed fracture of multiple ribs of left side, sequela  Closed nondisplaced fracture  of fifth metatarsal bone of left foot, sequela  Chronic pain syndrome   Patient is being discharged with the following home health services:  OT/PT    Patient is being discharged with the following durable medical equipment:  Rolling  walker  Patient has been advised to f/u with their PCP in 1-2 weeks to bring them up to date on their rehab stay.  Social services at facility was responsible for arranging this appointment.  Pt was provided with a 30 day supply of prescriptions for medications and refills must be obtained from their PCP.  For controlled substances, a more limited supply may be provided adequate until PCP appointment only.  Medications have been reconciled.  Time spent > 30 min;> 50% of time with patient was spent reviewing records, labs, tests and studies, counseling and developing plan of care  Randon Goldsmithnne D. Lyn HollingsheadAlexander, MD

## 2017-09-01 ENCOUNTER — Other Ambulatory Visit: Payer: Self-pay | Admitting: Internal Medicine

## 2017-09-01 ENCOUNTER — Telehealth: Payer: Self-pay | Admitting: Internal Medicine

## 2017-09-01 DIAGNOSIS — S32512D Fracture of superior rim of left pubis, subsequent encounter for fracture with routine healing: Secondary | ICD-10-CM | POA: Diagnosis not present

## 2017-09-01 DIAGNOSIS — S32592D Other specified fracture of left pubis, subsequent encounter for fracture with routine healing: Secondary | ICD-10-CM | POA: Diagnosis not present

## 2017-09-01 DIAGNOSIS — M1612 Unilateral primary osteoarthritis, left hip: Secondary | ICD-10-CM | POA: Diagnosis not present

## 2017-09-01 DIAGNOSIS — Z9181 History of falling: Secondary | ICD-10-CM | POA: Diagnosis not present

## 2017-09-01 DIAGNOSIS — S92352D Displaced fracture of fifth metatarsal bone, left foot, subsequent encounter for fracture with routine healing: Secondary | ICD-10-CM | POA: Diagnosis not present

## 2017-09-01 DIAGNOSIS — G43909 Migraine, unspecified, not intractable, without status migrainosus: Secondary | ICD-10-CM | POA: Diagnosis not present

## 2017-09-01 DIAGNOSIS — M5090 Cervical disc disorder, unspecified, unspecified cervical region: Secondary | ICD-10-CM | POA: Diagnosis not present

## 2017-09-01 DIAGNOSIS — Z87891 Personal history of nicotine dependence: Secondary | ICD-10-CM | POA: Diagnosis not present

## 2017-09-01 DIAGNOSIS — S2242XD Multiple fractures of ribs, left side, subsequent encounter for fracture with routine healing: Secondary | ICD-10-CM | POA: Diagnosis not present

## 2017-09-01 DIAGNOSIS — E02 Subclinical iodine-deficiency hypothyroidism: Secondary | ICD-10-CM | POA: Diagnosis not present

## 2017-09-01 DIAGNOSIS — M48061 Spinal stenosis, lumbar region without neurogenic claudication: Secondary | ICD-10-CM | POA: Diagnosis not present

## 2017-09-01 DIAGNOSIS — F329 Major depressive disorder, single episode, unspecified: Secondary | ICD-10-CM | POA: Diagnosis not present

## 2017-09-01 NOTE — Telephone Encounter (Signed)
Copied from CRM [phone]#[id number]. Topic: General - Other >> Sep 01, 2017  4:48 PM Gerrianne ScalePayne, Oday Ridings L wrote: Reason for CRM: [phone] calling for verbal orders for PT for 1 week 1 and 2 week 6 due to a fall FX pelvic and want to know if Provider will sign orders

## 2017-09-02 ENCOUNTER — Encounter: Payer: Self-pay | Admitting: Internal Medicine

## 2017-09-05 DIAGNOSIS — M1612 Unilateral primary osteoarthritis, left hip: Secondary | ICD-10-CM | POA: Diagnosis not present

## 2017-09-05 DIAGNOSIS — G43909 Migraine, unspecified, not intractable, without status migrainosus: Secondary | ICD-10-CM | POA: Diagnosis not present

## 2017-09-05 DIAGNOSIS — S32592D Other specified fracture of left pubis, subsequent encounter for fracture with routine healing: Secondary | ICD-10-CM | POA: Diagnosis not present

## 2017-09-05 DIAGNOSIS — S2242XD Multiple fractures of ribs, left side, subsequent encounter for fracture with routine healing: Secondary | ICD-10-CM | POA: Diagnosis not present

## 2017-09-05 DIAGNOSIS — S32512D Fracture of superior rim of left pubis, subsequent encounter for fracture with routine healing: Secondary | ICD-10-CM | POA: Diagnosis not present

## 2017-09-05 DIAGNOSIS — S92352D Displaced fracture of fifth metatarsal bone, left foot, subsequent encounter for fracture with routine healing: Secondary | ICD-10-CM | POA: Diagnosis not present

## 2017-09-05 NOTE — Telephone Encounter (Signed)
Please advise Dr Panosh, thanks.   

## 2017-09-06 ENCOUNTER — Telehealth: Payer: Self-pay | Admitting: Family Medicine

## 2017-09-06 NOTE — Telephone Encounter (Signed)
Copied from CRM [id number]. Topic: Inquiry >> Sep 06, 2017 11:27 AM Alexander Bergeron B wrote: Reason for CRM: [facility] called for verbals for OT 2x/wk 3wks; pt bp 150/95 @ evaluation; pt was stressed, if pcp needs anything concerning this contact [facility] or pt Contact [phone]

## 2017-09-06 NOTE — Telephone Encounter (Signed)
Please advise Dr Panosh, thanks.   

## 2017-09-06 NOTE — Telephone Encounter (Signed)
Ortho or rehab/physical medicine  should manage her fractures     As well as any   pain control  Medications .  It appears that she has   fractures at multiple sites   Ribs   And previous foot fracture .( dr Shelle Iron)  I f she hasnt seen ortho  Rehab such as dr Holly Bodily all I advise the ortho team be involved  . To manage orders.

## 2017-09-06 NOTE — Telephone Encounter (Signed)
Melissa at [facility] aware of recommendations per Dr Fabian Sharp. She will pass this information on to the PT team. Nothing further needed.

## 2017-09-06 NOTE — Progress Notes (Signed)
Chief Complaint  Patient presents with  . Follow-up    D/c from rehab 08/31/17. Feels helpless, no real help around the house. Pt appears depressed  and anxious.     HPI: Toni [name] 62 y.o. come in forfu  Hops and rehab after fall and  Fractures see rehab discharge. Had a unusual fall was distracted lifting and had groin pain but after more than one visit to the emergency room was finally diagnosed as pubic rami fractures multiple areas.  Her left foot fracture had been healed and had had rib fractures during that time.  Family brought her here today she just got discharged from rehab last week has been taking oxycodone and was given enough for almost a month she has pain with activity but is able to get up with her walker move around somewhat there is some accommodations in her shower but some disruption when she came home by herself. PT OT was ordered. She is talking with her counselor over the phone. She has an appointment with pain management through neurosurgeon Dr. Ollen Bowl we believe September 19. She has a strong odor to her urine today but no UTI symptoms. In rehab her blood pressure was elevated could have been related to pain and stress.       ROS: See pertinent positives and negatives per HPI.  Past Medical History:  Diagnosis Date  . Acute hepatitis C without mention of hepatic coma(070.51)    No treatment  . Brachial neuritis or radiculitis NOS   . Depressive disorder, not elsewhere classified   . Essential and other specified forms of tremor   . Headache(784.0)   . Memory loss   . Microscopic hematuria    seed dr Patsi Sears in past  . Migraine, unspecified, without mention of intractable migraine without mention of status migrainosus   . Other specified disorders of rotator cuff syndrome of shoulder and allied disorders   . Vulvar pain    rx with valium supp    Family History  Problem Relation Age of Onset  . Headache Mother   . Stroke Father         father passed away june17  . Diabetes Maternal Grandmother   . Other Son        Mother is deceased June 10, 2011 father moved to the area 06/09/12.    Social History   Socioeconomic History  . Marital status: Divorced    Spouse name: Not on file  . Number of children: 3  . Years of education: Not on file  . Highest education level: Not on file  Occupational History  . Not on file  Social Needs  . Financial resource strain: Not on file  . Food insecurity:    Worry: Not on file    Inability: Not on file  . Transportation needs:    Medical: Not on file    Non-medical: Not on file  Tobacco Use  . Smoking status: Former Smoker    Types: Cigarettes    Last attempt to quit: 05/23/1978    Years since quitting: 39.3  . Smokeless tobacco: Never Used  Substance and Sexual Activity  . Alcohol use: No    Alcohol/week: 0.0 standard drinks  . Drug use: No  . Sexual activity: Not on file  Lifestyle  . Physical activity:    Days per week: Not on file    Minutes per session: Not on file  . Stress: Not on file  Relationships  . Social connections:  Talks on phone: Not on file    Gets together: Not on file    Attends religious service: Not on file    Active member of club or organization: Not on file    Attends meetings of clubs or organizations: Not on file    Relationship status: Not on file  Other Topics Concern  . 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 On disability for depression and short-term memory problems. Dr. love.   She is gravida 3 para 3   Father passed away  25-Jun-2022   Outpatient Medications Prior to Visit  Medication Sig Dispense Refill  . albuterol (PROVENTIL HFA;VENTOLIN HFA) 108 (90 Base) MCG/ACT inhaler Inhale 2 puffs into the lungs every 6 (six) hours as needed for wheezing or shortness of breath. 1 Inhaler 0  . ALPRAZolam (XANAX)  1 MG tablet Take 1 tablet (1 mg total) by mouth 4 (four) times daily as needed for anxiety (shakes). 5 tablet 0  . Alum Hydroxide-Mag Trisilicate (GAVISCON) 80-14.2 MG CHEW Chew by mouth. CHEW 2 TABLETS BY MOUTH BEFORE EACH MEAL AT BEDTIME AS NEEDED FOR INDIGESTION OR HEARTBURN    . cetirizine (ZYRTEC) 10 MG tablet Take 10 mg by mouth daily as needed for allergies.    Marland Kitchen desvenlafaxine (PRISTIQ) 50 MG 24 hr tablet Take 50 mg by mouth daily.    Marland Kitchen dextroamphetamine (DEXEDRINE SPANSULE) 15 MG 24 hr capsule Take 30-45 mg by mouth daily. 2 po q am and 1 po q noon    . estradiol (ESTRACE) 0.1 MG/GM vaginal cream Place 1 Applicatorful vaginally daily as needed. 42.5 g 1  . FLUoxetine (PROZAC) 20 MG capsule Take 1 capsule by mouth daily.  0  . gabapentin (NEURONTIN) 600 MG tablet Take 600 mg by mouth. TAKE 2 TABS (1200 MG) PO TID FOR HEEL PAIN    . ibuprofen (ADVIL,MOTRIN) 200 MG tablet Take 200 mg by mouth every 6 (six) hours as needed. TAKE 2 TABLETS (400MG ) BY MOUTH EVERY 6 HOURS FOR PAIN    . ketoconazole (NIZORAL) 2 % cream APPLY TO AFFECTED AREA TWICE A DAY 30 g 0  . levothyroxine (SYNTHROID, LEVOTHROID) 25 MCG tablet TAKE 1 TABLET (25 MCG TOTAL) BY MOUTH DAILY BEFORE BREAKFAST. TAKE ON AN EMPTY STOMACH 30 tablet 1  . methocarbamol (ROBAXIN) 500 MG tablet Take 1 tablet (500 mg total) by mouth every 6 (six) hours as needed for muscle spasms. 30 tablet 0  . Misc Natural Products (ESTROVEN + ENERGY MAX STRENGTH) TABS Take by mouth daily.    . Multiple Vitamins-Minerals (CENTRUM SILVER ADULT 50+) TABS Take 1 tablet by mouth daily.    Marland Kitchen oxyCODONE (OXYCONTIN) 10 mg 12 hr tablet Take 10 mg by mouth. Give 1 tablet by mouth every 6 hours routinely for pain    . polyethylene glycol (MIRALAX / GLYCOLAX) packet Take 17 g by mouth daily. 14 each 0  . SUMAtriptan (IMITREX) 25 MG tablet Take 1 tablet (25 mg total) by mouth as needed for migraine. May repeat in 2 hours if headache persists or recurs. 10 tablet 11    No facility-administered medications prior to visit.      EXAM:  BP 124/72 (BP Location: Right Arm, Patient Position: Sitting, Cuff Size: Normal)   Pulse (!) 105   Temp 97.7 F (36.5 C) (Oral)  There is no height or weight on file to calculate BMI.  GENERAL: vitals reviewed and listed above, alert, oriented, appears well hydrated and in emotional at sometimes.  But normal speech and interaction.  Some resolving bruising on her left upper arm. HEENT: atraumatic, conjunctiva  clear, no obvious abnormalities on inspection of external nose and ears NECK: no obvious masses on inspection palpation  LUNGS: clear to auscultation bilaterally, no wheezes, rales or rhonchi, good air movement CV: HRRR, no clubbing cyanosis or  peripheral edema nl cap refill  Able to arise with her walker and do some steps.  No significant tremor or shaking at this time. MS: moves all extremities  PSYCH: More anxious than usual but oriented and cooperative,  Lab Results  Component Value Date   WBC 5.6 08/14/2017   HGB 11.4 (A) 08/14/2017   HCT 36 08/14/2017   PLT 238 08/14/2017   GLUCOSE 98 08/06/2017   CHOL 213 (H) 02/28/2017   TRIG 113.0 02/28/2017   HDL 46.90 02/28/2017   LDLCALC 144 (H) 02/28/2017   ALT 20 08/06/2017   AST 19 08/06/2017   NA 141 08/14/2017   K 4.8 08/14/2017   CL 105 08/06/2017   CREATININE 0.8 08/14/2017   BUN 29 (A) 08/14/2017   CO2 27 08/06/2017   TSH 2.57 07/14/2017   HGBA1C 5.8 02/28/2017   BP Readings from Last 3 Encounters:  09/07/17 124/72  08/29/17 129/80  08/11/17 105/67   Wt Readings from Last 3 Encounters:  08/29/17 212 lb (96.2 kg)  08/11/17 212 lb (96.2 kg)  08/10/17 212 lb 4.9 oz (96.3 kg)     ASSESSMENT AND PLAN:  Discussed the following assessment and plan:  Closed fracture of multiple rami of left pubis with routine healing, subsequent encounter  Need for influenza vaccination - Plan: Flu Vaccine QUAD 6+ mos PF IM (Fluarix Quad  PF)  Generalized anxiety disorder and depressive reaction  - related to episode   Closed fracture of multiple ribs of left side, sequela  Medication management - pain manage ment via specialty care  ? dexa ?    Bone health to address in the future but at this time there is so many other issues to address and coordinate. Agree with proceeding  w orders and  cosigning OT/ PT orders.  Discussed home self care and asking for help from home health. Encouraged contact with her counselor Dr. Magdalen Spatz s office. Missed her grand child interactions  Pain management through specialist. Blood pressure respiratory seems to be stable today.  Flu shot advised. Given today  Total visit > 50% spent counseling and coordinating care as indicated in above note and in instructions to patient .   -Patient advised to return or notify health care team  if  new concerns arise.  Patient Instructions  Will  Sign  off on pt ot  Thereapy.    I dont  Manage  Narcotic pain management . Some  need help with that. Keep  Appt.   September 2019 .    You should probably been   On  building therapy  In future.   Flu shot    Today .   Keep with your  Counselor .     Plan rov in 2 months or as needed     Neta Mends. Maurica Omura M.D.

## 2017-09-07 ENCOUNTER — Encounter: Payer: Self-pay | Admitting: Internal Medicine

## 2017-09-07 ENCOUNTER — Ambulatory Visit: Payer: 59 | Admitting: Internal Medicine

## 2017-09-07 VITALS — BP 124/72 | HR 105 | Temp 97.7°F

## 2017-09-07 DIAGNOSIS — S32512D Fracture of superior rim of left pubis, subsequent encounter for fracture with routine healing: Secondary | ICD-10-CM | POA: Diagnosis not present

## 2017-09-07 DIAGNOSIS — G43909 Migraine, unspecified, not intractable, without status migrainosus: Secondary | ICD-10-CM | POA: Diagnosis not present

## 2017-09-07 DIAGNOSIS — S32592D Other specified fracture of left pubis, subsequent encounter for fracture with routine healing: Secondary | ICD-10-CM

## 2017-09-07 DIAGNOSIS — S2242XS Multiple fractures of ribs, left side, sequela: Secondary | ICD-10-CM | POA: Diagnosis not present

## 2017-09-07 DIAGNOSIS — F411 Generalized anxiety disorder: Secondary | ICD-10-CM

## 2017-09-07 DIAGNOSIS — Z23 Encounter for immunization: Secondary | ICD-10-CM

## 2017-09-07 DIAGNOSIS — S2242XD Multiple fractures of ribs, left side, subsequent encounter for fracture with routine healing: Secondary | ICD-10-CM | POA: Diagnosis not present

## 2017-09-07 DIAGNOSIS — S92352D Displaced fracture of fifth metatarsal bone, left foot, subsequent encounter for fracture with routine healing: Secondary | ICD-10-CM | POA: Diagnosis not present

## 2017-09-07 DIAGNOSIS — M1612 Unilateral primary osteoarthritis, left hip: Secondary | ICD-10-CM | POA: Diagnosis not present

## 2017-09-07 DIAGNOSIS — Z79899 Other long term (current) drug therapy: Secondary | ICD-10-CM

## 2017-09-07 NOTE — Patient Instructions (Addendum)
Will  Sign  off on pt ot  Thereapy.    I dont  Manage  Narcotic pain management . Some  need help with that. Keep  Appt.   September 2019 .    You should probably been   On  building therapy  In future.   Flu shot    Today .   Keep with your  Counselor .     Plan rov in 2 months or as needed

## 2017-09-08 DIAGNOSIS — S32592D Other specified fracture of left pubis, subsequent encounter for fracture with routine healing: Secondary | ICD-10-CM | POA: Diagnosis not present

## 2017-09-08 DIAGNOSIS — S2242XD Multiple fractures of ribs, left side, subsequent encounter for fracture with routine healing: Secondary | ICD-10-CM | POA: Diagnosis not present

## 2017-09-08 DIAGNOSIS — S92352D Displaced fracture of fifth metatarsal bone, left foot, subsequent encounter for fracture with routine healing: Secondary | ICD-10-CM | POA: Diagnosis not present

## 2017-09-08 DIAGNOSIS — S32512D Fracture of superior rim of left pubis, subsequent encounter for fracture with routine healing: Secondary | ICD-10-CM | POA: Diagnosis not present

## 2017-09-08 DIAGNOSIS — M1612 Unilateral primary osteoarthritis, left hip: Secondary | ICD-10-CM | POA: Diagnosis not present

## 2017-09-08 DIAGNOSIS — G43909 Migraine, unspecified, not intractable, without status migrainosus: Secondary | ICD-10-CM | POA: Diagnosis not present

## 2017-09-08 NOTE — Telephone Encounter (Signed)
Ok to proceed with orders  

## 2017-09-08 NOTE — Telephone Encounter (Signed)
Called and LM for the OT to call back concerning these orders.  Dr. Fabian Sharp is ok with giving verbal for these.

## 2017-09-12 DIAGNOSIS — G43909 Migraine, unspecified, not intractable, without status migrainosus: Secondary | ICD-10-CM | POA: Diagnosis not present

## 2017-09-12 DIAGNOSIS — S32512D Fracture of superior rim of left pubis, subsequent encounter for fracture with routine healing: Secondary | ICD-10-CM | POA: Diagnosis not present

## 2017-09-12 DIAGNOSIS — S92352D Displaced fracture of fifth metatarsal bone, left foot, subsequent encounter for fracture with routine healing: Secondary | ICD-10-CM | POA: Diagnosis not present

## 2017-09-12 DIAGNOSIS — S32592D Other specified fracture of left pubis, subsequent encounter for fracture with routine healing: Secondary | ICD-10-CM | POA: Diagnosis not present

## 2017-09-12 DIAGNOSIS — S2242XD Multiple fractures of ribs, left side, subsequent encounter for fracture with routine healing: Secondary | ICD-10-CM | POA: Diagnosis not present

## 2017-09-12 DIAGNOSIS — M1612 Unilateral primary osteoarthritis, left hip: Secondary | ICD-10-CM | POA: Diagnosis not present

## 2017-09-13 DIAGNOSIS — S2242XD Multiple fractures of ribs, left side, subsequent encounter for fracture with routine healing: Secondary | ICD-10-CM | POA: Diagnosis not present

## 2017-09-13 DIAGNOSIS — M1612 Unilateral primary osteoarthritis, left hip: Secondary | ICD-10-CM | POA: Diagnosis not present

## 2017-09-13 DIAGNOSIS — S92352D Displaced fracture of fifth metatarsal bone, left foot, subsequent encounter for fracture with routine healing: Secondary | ICD-10-CM | POA: Diagnosis not present

## 2017-09-13 DIAGNOSIS — G43909 Migraine, unspecified, not intractable, without status migrainosus: Secondary | ICD-10-CM | POA: Diagnosis not present

## 2017-09-13 DIAGNOSIS — S32592D Other specified fracture of left pubis, subsequent encounter for fracture with routine healing: Secondary | ICD-10-CM | POA: Diagnosis not present

## 2017-09-13 DIAGNOSIS — S32512D Fracture of superior rim of left pubis, subsequent encounter for fracture with routine healing: Secondary | ICD-10-CM | POA: Diagnosis not present

## 2017-09-14 DIAGNOSIS — S2242XD Multiple fractures of ribs, left side, subsequent encounter for fracture with routine healing: Secondary | ICD-10-CM | POA: Diagnosis not present

## 2017-09-14 DIAGNOSIS — S92352D Displaced fracture of fifth metatarsal bone, left foot, subsequent encounter for fracture with routine healing: Secondary | ICD-10-CM | POA: Diagnosis not present

## 2017-09-14 DIAGNOSIS — M1612 Unilateral primary osteoarthritis, left hip: Secondary | ICD-10-CM | POA: Diagnosis not present

## 2017-09-14 DIAGNOSIS — S32592D Other specified fracture of left pubis, subsequent encounter for fracture with routine healing: Secondary | ICD-10-CM | POA: Diagnosis not present

## 2017-09-14 DIAGNOSIS — S32512D Fracture of superior rim of left pubis, subsequent encounter for fracture with routine healing: Secondary | ICD-10-CM | POA: Diagnosis not present

## 2017-09-14 DIAGNOSIS — G43909 Migraine, unspecified, not intractable, without status migrainosus: Secondary | ICD-10-CM | POA: Diagnosis not present

## 2017-09-18 DIAGNOSIS — S32512D Fracture of superior rim of left pubis, subsequent encounter for fracture with routine healing: Secondary | ICD-10-CM | POA: Diagnosis not present

## 2017-09-18 DIAGNOSIS — S2242XD Multiple fractures of ribs, left side, subsequent encounter for fracture with routine healing: Secondary | ICD-10-CM | POA: Diagnosis not present

## 2017-09-18 DIAGNOSIS — S32592D Other specified fracture of left pubis, subsequent encounter for fracture with routine healing: Secondary | ICD-10-CM | POA: Diagnosis not present

## 2017-09-18 DIAGNOSIS — S92352D Displaced fracture of fifth metatarsal bone, left foot, subsequent encounter for fracture with routine healing: Secondary | ICD-10-CM | POA: Diagnosis not present

## 2017-09-18 DIAGNOSIS — G43909 Migraine, unspecified, not intractable, without status migrainosus: Secondary | ICD-10-CM | POA: Diagnosis not present

## 2017-09-18 DIAGNOSIS — M1612 Unilateral primary osteoarthritis, left hip: Secondary | ICD-10-CM | POA: Diagnosis not present

## 2017-09-21 ENCOUNTER — Telehealth: Payer: Self-pay | Admitting: Internal Medicine

## 2017-09-21 NOTE — Telephone Encounter (Signed)
Copied from CRM [id number]. Topic: General - Other >> Sep 21, 2017 12:11 PM Angela NevinWilliams, Candice N wrote: Pt called stating that she has changed her mind about getting a handicapped placard if Dr. Fabian SharpPanosh is still willing to complete the form. Pt states that she has having difficulty getting her grandson to and from bus stop and has already scheduled a follow up appointment with ortho. Please advise.

## 2017-09-22 NOTE — Telephone Encounter (Signed)
I will sign a temproary handicapped   Permit pleas prepare one for her  For me to sign

## 2017-09-22 NOTE — Telephone Encounter (Signed)
Please advise Dr Panosh, thanks.   

## 2017-09-25 NOTE — Telephone Encounter (Signed)
Placard filled out and is in red folder for sig.  Please advise Dr Fabian SharpPanosh, thanks.

## 2017-09-25 NOTE — Telephone Encounter (Signed)
Form placed upfront to be picked up.  

## 2017-09-25 NOTE — Telephone Encounter (Signed)
Placard complete.  Pt aware.  Nothing further needed.

## 2017-09-25 NOTE — Telephone Encounter (Signed)
done

## 2017-11-20 NOTE — Progress Notes (Deleted)
No chief complaint on file.   HPI: Toni [name] 62 y.o. come in for Chronic disease management  ROS: See pertinent positives and negatives per HPI.  Past Medical History:  Diagnosis Date  . Acute hepatitis C without mention of hepatic coma(070.51)    No treatment  . Brachial neuritis or radiculitis NOS   . Depressive disorder, not elsewhere classified   . Essential and other specified forms of tremor   . Headache(784.0)   . Memory loss   . Microscopic hematuria    seed dr Toni [name] in past  . Migraine, unspecified, without mention of intractable migraine without mention of status migrainosus   . Other specified disorders of rotator cuff syndrome of shoulder and allied disorders   . Vulvar pain    rx with valium supp    Family History  Problem Relation Age of Onset  . Headache Mother   . Stroke Father        father passed away june17  . Diabetes Maternal Grandmother   . Other Son        Mother is deceased 2013 father moved to the area 2014.    Social History   Socioeconomic History  . Marital status: Divorced    Spouse name: Not on file  . Number of children: 3  . Years of education: Not on file  . Highest education level: Not on file  Occupational History  . Not on file  Social Needs  . Financial resource strain: Not on file  . Food insecurity:    Worry: Not on file    Inability: Not on file  . Transportation needs:    Medical: Not on file    Non-medical: Not on file  Tobacco Use  . Smoking status: Former Smoker    Types: Cigarettes    Last attempt to quit: 05/23/1978    Years since quitting: 39.5  . Smokeless tobacco: Never Used  Substance and Sexual Activity  . Alcohol use: No    Alcohol/week: 0.0 standard drinks  . Drug use: No  . Sexual activity: Not on file  Lifestyle  . Physical activity:    Days per week: Not on file    Minutes per session: Not on file  . Stress: Not on file  Relationships  . Social connections:    Talks  on phone: Not on file    Gets together: Not on file    Attends religious service: Not on file    Active member of club or organization: Not on file    Attends meetings of clubs or organizations: Not on file    Relationship status: Not on file  Other Topics Concern  . Not on file  Social History Narrative   Does not sleep well at night   Lives alone   Has two cats   On Environmental managerDisability   College graduate previous worked for [employer]. On disability for depression and short-term memory problems. Dr. love.   She is gravida 3 para 3   Father passed away  June 17    Outpatient Medications Prior to Visit  Medication Sig Dispense Refill  . albuterol (PROVENTIL HFA;VENTOLIN HFA) 108 (90 Base) MCG/ACT inhaler Inhale 2 puffs into the lungs every 6 (six) hours as needed for wheezing or shortness of breath. 1 Inhaler 0  . ALPRAZolam (XANAX) 1 MG tablet Take 1 tablet (1 mg total) by mouth 4 (four) times daily as needed for anxiety (shakes). 5 tablet 0  . Alum Hydroxide-Mag  Trisilicate (GAVISCON) 80-14.2 MG CHEW Chew by mouth. CHEW 2 TABLETS BY MOUTH BEFORE EACH MEAL AT BEDTIME AS NEEDED FOR INDIGESTION OR HEARTBURN    . cetirizine (ZYRTEC) 10 MG tablet Take 10 mg by mouth daily as needed for allergies.    Marland Kitchen desvenlafaxine (PRISTIQ) 50 MG 24 hr tablet Take 50 mg by mouth daily.    Marland Kitchen dextroamphetamine (DEXEDRINE SPANSULE) 15 MG 24 hr capsule Take 30-45 mg by mouth daily. 2 po q am and 1 po q noon    . estradiol (ESTRACE) 0.1 MG/GM vaginal cream Place 1 Applicatorful vaginally daily as needed. 42.5 g 1  . FLUoxetine (PROZAC) 20 MG capsule Take 1 capsule by mouth daily.  0  . gabapentin (NEURONTIN) 600 MG tablet Take 600 mg by mouth. TAKE 2 TABS (1200 MG) PO TID FOR HEEL PAIN    . ibuprofen (ADVIL,MOTRIN) 200 MG tablet Take 200 mg by mouth every 6 (six) hours as needed. TAKE 2 TABLETS (400MG ) BY MOUTH EVERY 6 HOURS FOR PAIN    . ketoconazole (NIZORAL) 2 % cream APPLY TO AFFECTED AREA TWICE A DAY 30 g 0    . levothyroxine (SYNTHROID, LEVOTHROID) 25 MCG tablet TAKE 1 TABLET (25 MCG TOTAL) BY MOUTH DAILY BEFORE BREAKFAST. TAKE ON AN EMPTY STOMACH 30 tablet 1  . methocarbamol (ROBAXIN) 500 MG tablet Take 1 tablet (500 mg total) by mouth every 6 (six) hours as needed for muscle spasms. 30 tablet 0  . Misc Natural Products (ESTROVEN + ENERGY MAX STRENGTH) TABS Take by mouth daily.    . Multiple Vitamins-Minerals (CENTRUM SILVER ADULT 50+) TABS Take 1 tablet by mouth daily.    Marland Kitchen oxyCODONE (OXYCONTIN) 10 mg 12 hr tablet Take 10 mg by mouth. Give 1 tablet by mouth every 6 hours routinely for pain    . polyethylene glycol (MIRALAX / GLYCOLAX) packet Take 17 g by mouth daily. 14 each 0  . SUMAtriptan (IMITREX) 25 MG tablet Take 1 tablet (25 mg total) by mouth as needed for migraine. May repeat in 2 hours if headache persists or recurs. 10 tablet 11   No facility-administered medications prior to visit.      EXAM:  There were no vitals taken for this visit.  There is no height or weight on file to calculate BMI.  GENERAL: vitals reviewed and listed above, alert, oriented, appears well hydrated and in no acute distress HEENT: atraumatic, conjunctiva  clear, no obvious abnormalities on inspection of external nose and ears OP : no lesion edema or exudate  NECK: no obvious masses on inspection palpation  LUNGS: clear to auscultation bilaterally, no wheezes, rales or rhonchi, good air movement CV: HRRR, no clubbing cyanosis or  peripheral edema nl cap refill  MS: moves all extremities without noticeable focal  abnormality PSYCH: pleasant and cooperative, no obvious depression or anxiety Lab Results  Component Value Date   WBC 5.6 08/14/2017   HGB 11.4 (A) 08/14/2017   HCT 36 08/14/2017   PLT 238 08/14/2017   GLUCOSE 98 08/06/2017   CHOL 213 (H) 02/28/2017   TRIG 113.0 02/28/2017   HDL 46.90 02/28/2017   LDLCALC 144 (H) 02/28/2017   ALT 20 08/06/2017   AST 19 08/06/2017   NA 141 08/14/2017   K  4.8 08/14/2017   CL 105 08/06/2017   CREATININE 0.8 08/14/2017   BUN 29 (A) 08/14/2017   CO2 27 08/06/2017   TSH 2.57 07/14/2017   HGBA1C 5.8 02/28/2017   BP Readings from Last 3  Encounters:  09/07/17 124/72  08/29/17 129/80  08/11/17 105/67   Wt Readings from Last 3 Encounters:  08/29/17 212 lb (96.2 kg)  08/11/17 212 lb (96.2 kg)  08/10/17 212 lb 4.9 oz (96.3 kg)     ASSESSMENT AND PLAN:  Discussed the following assessment and plan:  No diagnosis found.  -Patient advised to return or notify health care team  if  new concerns arise.  There are no Patient Instructions on file for this visit.   Neta Mends. Panosh M.D.

## 2017-11-21 ENCOUNTER — Ambulatory Visit: Payer: 59 | Admitting: Internal Medicine

## 2017-12-05 ENCOUNTER — Encounter: Payer: Self-pay | Admitting: Internal Medicine

## 2017-12-05 ENCOUNTER — Ambulatory Visit: Payer: 59 | Admitting: Internal Medicine

## 2017-12-05 VITALS — BP 120/80 | HR 86 | Temp 97.7°F | Ht 63.0 in | Wt 196.2 lb

## 2017-12-05 DIAGNOSIS — Z87820 Personal history of traumatic brain injury: Secondary | ICD-10-CM

## 2017-12-05 DIAGNOSIS — B1920 Unspecified viral hepatitis C without hepatic coma: Secondary | ICD-10-CM

## 2017-12-05 DIAGNOSIS — R7989 Other specified abnormal findings of blood chemistry: Secondary | ICD-10-CM

## 2017-12-05 DIAGNOSIS — E785 Hyperlipidemia, unspecified: Secondary | ICD-10-CM

## 2017-12-05 DIAGNOSIS — M5481 Occipital neuralgia: Secondary | ICD-10-CM | POA: Diagnosis not present

## 2017-12-05 DIAGNOSIS — E038 Other specified hypothyroidism: Secondary | ICD-10-CM

## 2017-12-05 DIAGNOSIS — E039 Hypothyroidism, unspecified: Secondary | ICD-10-CM | POA: Diagnosis not present

## 2017-12-05 DIAGNOSIS — Z0000 Encounter for general adult medical examination without abnormal findings: Secondary | ICD-10-CM

## 2017-12-05 DIAGNOSIS — Z79899 Other long term (current) drug therapy: Secondary | ICD-10-CM | POA: Diagnosis not present

## 2017-12-05 DIAGNOSIS — B1710 Acute hepatitis C without hepatic coma: Secondary | ICD-10-CM | POA: Insufficient documentation

## 2017-12-05 DIAGNOSIS — R251 Tremor, unspecified: Secondary | ICD-10-CM

## 2017-12-05 HISTORY — DX: Unspecified viral hepatitis C without hepatic coma: B19.20

## 2017-12-05 NOTE — Patient Instructions (Addendum)
Will put  In orders pre  Physical  For fasting labs .  Suggest get back with neurology about those ?s . Will work  on correcting the hep C    Dx .   Glad you are doing better .

## 2017-12-05 NOTE — Progress Notes (Signed)
Chief Complaint  Patient presents with  . Follow-up    HPI: Toni [name] 62 y.o. come in for Chronic disease management    Eye surgery  Right and then to next dec 19th left .  Dr Charlotte Sanes .    Hep c is cured and not  Reported in chart  Ned sto be  Updated   MRI of foot.  Done for pain dr bean did we get this? Some damage ? Now independent  And doing ok   Filing for  Custody .  Of gc  27 years old  ( father on methadone and  Family issues)  Stressed  Overwhelmed  But stable  and trying to wean meds   Planning to decrease the medication  Used  And weaning.   neiurontin still with  neuropathy pain  Post pain occipital neuropathy .  Worse when stressed.  Dellis Filbert use of   And les of dexedrine.  But doing pretty well   Taking  Care of michael.    ? About need for fu mri head  Or not    To see dr Karel Jarvis  In  Feb 2020   ROS: See pertinent positives and negatives per HPI. No fall cp sob mood better  And feels pretty good   Past Medical History:  Diagnosis Date  . Brachial neuritis or radiculitis NOS   . Depressive disorder, not elsewhere classified   . Essential and other specified forms of tremor   . Headache(784.0)   . Hepatitis C infection 12/05/2017   Treated and now resolved and "cured " as of early 05-Jun-2016  And     . Memory loss   . Microscopic hematuria    seed dr Patsi Sears in past  . Migraine, unspecified, without mention of intractable migraine without mention of status migrainosus   . Other specified disorders of rotator cuff syndrome of shoulder and allied disorders   . Vulvar pain    rx with valium supp    Family History  Problem Relation Age of Onset  . Headache Mother   . Stroke Father        father passed away june17  . Diabetes Maternal Grandmother   . Other Son        Mother is deceased Jun 06, 2011 father moved to the area 2012/06/05.    Social History   Socioeconomic History  . Marital status: Divorced    Spouse name: Not on file  . Number of  children: 3  . Years of education: Not on file  . Highest education level: Not on file  Occupational History  . Not on file  Social Needs  . Financial resource strain: Not on file  . Food insecurity:    Worry: Not on file    Inability: Not on file  . Transportation needs:    Medical: Not on file    Non-medical: Not on file  Tobacco Use  . Smoking status: Former Smoker    Types: Cigarettes    Last attempt to quit: 05/23/1978    Years since quitting: 39.5  . Smokeless tobacco: Never Used  Substance and Sexual Activity  . Alcohol use: No    Alcohol/week: 0.0 standard drinks  . Drug use: No  . Sexual activity: Not on file  Lifestyle  . Physical activity:    Days per week: Not on file    Minutes per session: Not on file  . Stress: Not on file  Relationships  . Social  connections:    Talks on phone: Not on file    Gets together: Not on file    Attends religious service: Not on file    Active member of club or organization: Not on file    Attends meetings of clubs or organizations: Not on file    Relationship status: Not on file  Other Topics Concern  . Not on file  Social History Narrative   Does not sleep well at night   Lives alone   Has two cats   On Environmental managerDisability   College graduate previous worked for [employer]. On disability for depression and short-term memory problems. Dr. love.   She is gravida 3 para 3   Father passed away  June 17    Outpatient Medications Prior to Visit  Medication Sig Dispense Refill  . albuterol (PROVENTIL HFA;VENTOLIN HFA) 108 (90 Base) MCG/ACT inhaler Inhale 2 puffs into the lungs every 6 (six) hours as needed for wheezing or shortness of breath. 1 Inhaler 0  . ALPRAZolam (XANAX) 1 MG tablet Take 1 tablet (1 mg total) by mouth 4 (four) times daily as needed for anxiety (shakes). 5 tablet 0  . Alum Hydroxide-Mag Trisilicate (GAVISCON) 80-14.2 MG CHEW Chew by mouth. CHEW 2 TABLETS BY MOUTH BEFORE EACH MEAL AT BEDTIME AS NEEDED FOR  INDIGESTION OR HEARTBURN    . cetirizine (ZYRTEC) 10 MG tablet Take 10 mg by mouth daily as needed for allergies.    Marland Kitchen. desvenlafaxine (PRISTIQ) 50 MG 24 hr tablet Take 50 mg by mouth daily.    Marland Kitchen. dextroamphetamine (DEXEDRINE SPANSULE) 15 MG 24 hr capsule Take 30-45 mg by mouth daily. 2 po q am and 1 po q noon    . estradiol (ESTRACE) 0.1 MG/GM vaginal cream Place 1 Applicatorful vaginally daily as needed. 42.5 g 1  . FLUoxetine (PROZAC) 20 MG capsule Take 1 capsule by mouth daily.  0  . gabapentin (NEURONTIN) 600 MG tablet Take 600 mg by mouth. TAKE 2 TABS (1200 MG) PO TID FOR HEEL PAIN    . ibuprofen (ADVIL,MOTRIN) 200 MG tablet Take 200 mg by mouth every 6 (six) hours as needed. TAKE 2 TABLETS (400MG ) BY MOUTH EVERY 6 HOURS FOR PAIN    . ketoconazole (NIZORAL) 2 % cream APPLY TO AFFECTED AREA TWICE A DAY 30 g 0  . levothyroxine (SYNTHROID, LEVOTHROID) 25 MCG tablet TAKE 1 TABLET (25 MCG TOTAL) BY MOUTH DAILY BEFORE BREAKFAST. TAKE ON AN EMPTY STOMACH 30 tablet 1  . methocarbamol (ROBAXIN) 500 MG tablet Take 1 tablet (500 mg total) by mouth every 6 (six) hours as needed for muscle spasms. 30 tablet 0  . Misc Natural Products (ESTROVEN + ENERGY MAX STRENGTH) TABS Take by mouth daily.    . Multiple Vitamins-Minerals (CENTRUM SILVER ADULT 50+) TABS Take 1 tablet by mouth daily.    Marland Kitchen. oxyCODONE (OXYCONTIN) 10 mg 12 hr tablet Take 10 mg by mouth. Give 1 tablet by mouth every 6 hours routinely for pain    . polyethylene glycol (MIRALAX / GLYCOLAX) packet Take 17 g by mouth daily. 14 each 0  . SUMAtriptan (IMITREX) 25 MG tablet Take 1 tablet (25 mg total) by mouth as needed for migraine. May repeat in 2 hours if headache persists or recurs. 10 tablet 11   No facility-administered medications prior to visit.      EXAM:  BP 120/80   Pulse 86   Temp 97.7 F (36.5 C) (Oral)   Ht 5\' 3"  (1.6 m)  Wt 196 lb 3.2 oz (89 kg)   SpO2 97%   BMI 34.76 kg/m   Body mass index is 34.76 kg/m.  GENERAL:  vitals reviewed and listed above, alert, oriented, appears well hydrated and in no acute distress HEENT: atraumatic, conjunctiva  clear, no obvious abnormalities on inspection of external nose and ears NECK: no obvious masses on inspection palpation  LUNGS: clear to auscultation bilaterally, no wheezes, rales or rhonchi, good air movement CV: HRRR, no clubbing cyanosis or  peripheral edema nl cap refill  MS: moves all extremities without noticeable focal  abnormality PSYCH: pleasant and cooperative,   Verbal some  Mild tremor at times  no obvious depression or anxiety Lab Results  Component Value Date   WBC 5.6 08/14/2017   HGB 11.4 (A) 08/14/2017   HCT 36 08/14/2017   PLT 238 08/14/2017   GLUCOSE 98 08/06/2017   CHOL 213 (H) 02/28/2017   TRIG 113.0 02/28/2017   HDL 46.90 02/28/2017   LDLCALC 144 (H) 02/28/2017   ALT 20 08/06/2017   AST 19 08/06/2017   NA 141 08/14/2017   K 4.8 08/14/2017   CL 105 08/06/2017   CREATININE 0.8 08/14/2017   BUN 29 (A) 08/14/2017   CO2 27 08/06/2017   TSH 2.57 07/14/2017   HGBA1C 5.8 02/28/2017   BP Readings from Last 3 Encounters:  12/05/17 120/80  09/07/17 124/72  08/29/17 129/80    ASSESSMENT AND PLAN:  Discussed the following assessment and plan:  Occipital neuralgia of right side - Plan: Basic metabolic panel, CBC with Differential/Platelet, Hepatic function panel, Lipid panel, TSH, T4, free  Medication management - Plan: Basic metabolic panel, CBC with Differential/Platelet, Hepatic function panel, Lipid panel, TSH, T4, free  Subclinical hypothyroidism - Plan: Basic metabolic panel, CBC with Differential/Platelet, Hepatic function panel, Lipid panel, TSH, T4, free  Abnormal TSH - Plan: Basic metabolic panel, CBC with Differential/Platelet, Hepatic function panel, Lipid panel, TSH, T4, free  Visit for preventive health examination - Plan: Basic metabolic panel, CBC with Differential/Platelet, Hepatic function panel, Lipid panel, TSH,  T4, free  Hyperlipidemia, unspecified hyperlipidemia type - Plan: Basic metabolic panel, CBC with Differential/Platelet, Hepatic function panel, Lipid panel, TSH, T4, free  Tremor  History of recent traumatic injury of head - some sx  residulal  hope to improve She has been  rx and  Her hep c has resolved as of early 2018  Will attempt  To correct the EHR .  Total visit 30 mins > 50% spent counseling and coordinating care as indicated in above note and in instructions to patient .   -Patient advised to return or notify health care team  if  new concerns arise.  Patient Instructions  Will put  In orders pre  Physical  For fasting labs .  Suggest get back with neurology about those ?s . Will work  on correcting the hep C    Dx .   Glad you are doing better .          Neta Mends. Panosh M.D.

## 2017-12-09 ENCOUNTER — Encounter: Payer: Self-pay | Admitting: Internal Medicine

## 2017-12-19 ENCOUNTER — Other Ambulatory Visit: Payer: Self-pay | Admitting: Internal Medicine

## 2017-12-26 ENCOUNTER — Encounter: Payer: 59 | Admitting: Internal Medicine

## 2017-12-28 ENCOUNTER — Telehealth: Payer: Self-pay | Admitting: Neurology

## 2017-12-28 NOTE — Telephone Encounter (Signed)
Pt called to notify us that she underwent cataract surgery on last week. Would like to know if she should keep her appt?

## 2018-01-02 ENCOUNTER — Ambulatory Visit: Payer: 59 | Admitting: Neurology

## 2018-01-11 ENCOUNTER — Ambulatory Visit: Payer: 59 | Admitting: Neurology

## 2018-01-12 ENCOUNTER — Ambulatory Visit: Payer: 59 | Admitting: Neurology

## 2018-01-12 ENCOUNTER — Encounter: Payer: Self-pay | Admitting: Neurology

## 2018-01-12 ENCOUNTER — Other Ambulatory Visit: Payer: Self-pay

## 2018-01-12 ENCOUNTER — Ambulatory Visit (INDEPENDENT_AMBULATORY_CARE_PROVIDER_SITE_OTHER): Payer: 59 | Admitting: Neurology

## 2018-01-12 VITALS — BP 100/62 | HR 100 | Ht 63.0 in | Wt 197.0 lb

## 2018-01-12 VITALS — BP 100/62 | Ht 63.0 in | Wt 197.0 lb

## 2018-01-12 DIAGNOSIS — IMO0002 Reserved for concepts with insufficient information to code with codable children: Secondary | ICD-10-CM

## 2018-01-12 DIAGNOSIS — G43709 Chronic migraine without aura, not intractable, without status migrainosus: Secondary | ICD-10-CM

## 2018-01-12 MED ORDER — SUMATRIPTAN SUCCINATE 6 MG/0.5ML ~~LOC~~ SOAJ: SUBCUTANEOUS | 11 refills | Status: DC

## 2018-01-12 MED ORDER — SUMATRIPTAN SUCCINATE 25 MG PO TABS: 25.0000 mg | ORAL_TABLET | ORAL | 11 refills | Status: DC | PRN

## 2018-01-12 NOTE — Progress Notes (Deleted)
NEUROLOGY FOLLOW UP OFFICE NOTE  Toni [name] [id number]  DOB: May 30, 1955  HISTORY OF PRESENT ILLNESS: Toni [name] was seen in follow-up in the neurology clinic on 02/21/2017.  The patient was last seen 4 months ago for migraines. On her last visit, she was very emotional, verbose and difficult to redirect, concerned about pain and tenderness in her skull. She had a head CT done which she was very concerned about due to report of "enlarged anterior extra-axial CSF spaces could be secondary to prominent frontal atrophy versus chronic subdural effusions, focal hypodensity in the left frontal lobe white matter, suspected to represent chronic infarct, but new since 2014." We did an MRI brain with and without contrast to further evaluate these changes reported, I personally reviewed imaging which showed moderate generalized atrophy accounting for the prominence of CSF spaces, no acute infarct or hemorrhage, no focal lesion, no significant white matter disease seen. Prominent extra-axial CSF spaces are present without a discrete collection, no abnormal enhancement. She presents today stating she is in a better place. She continues to be tremulous and emotional. She is difficult to redirect and relates her difficulties with family issues, now with custody of her 65 year old grandson Casimiro Needle, living in a townhouse with her ex-husband and grandson. She states "I'm still really smart, my memory was perfect" when she was able to close on the townhouse and do all the paperwork. She states she has made alterations in her life and that she is doing good and knows she needs to make changes in her life, "I'm thrilled with the amount of intelligence I have." She is excited because she was reading the terms and conditions of her townhome and could see "this is not right" and has an appointment with the manager of the complex. She states her problem is "my starter," she cannot get her starter in the  morning, taking her medications in the morning and going back to sleep, then waking up later on. Her schedule is irregular, she does not know when to stop. She then needs a day or two to rest. She "cannot stop finding that 7-letter word, spending 2 hours looking for it." She denies any further falls. She reporets her skull was fine for a while, but recently got worse. It is bad today because she did not take her gabapentin. She states her Pain specialist increased the dose. She continues to see her psychiatrist Dr. [name] regularly.   HPI: This is a 63 yo LH woman with a history of migraines, treatment-resistant depression, anxiety, hepatitis C, who had initially presented with a presumed diagnosis of frontotemporal dementia. She had previously been seeing neurologist Dr. [name], then she saw Dr. [name] at [facility] one time, followed by 2 visits with Dr. Conrad Coldstream at [facility]. I will summarize her history for our records. She reports she was high-performance, doing multiple things at the same time, until 2011 when she started having more difficulties at work. She had been unable to do things she used to do. She started having problems sleeping. She denied any problems with her memory. She had lack of stamina, word-finding difficulties, and had been going through stress with her son at that time. She had 2 brain MRI studies done (05/08/09 and 02/24/12) which I personally reviewed, showing mild atrophy in the bilateral frontal lobes. She had a PET scan in 2011 reported as normal. She underwent Neuropsychological evaluation with Dr. [name] in 2011 which did not identify areas of cognitive impairment. Her current  level of emotional distress seems far higher than her reported longtime baseline of what sounds like mild depression and generalized anxiety. Recommendation was for continued psychological counseling and psychiatric services.  On review of Dr. Cornelious BryantLefkowitz's notes, her MOCA score was normal 28/30. He stated that she  does not appear to the the disinhibition, apathy, or language disorder on exam which would be typical of frontotemporal dementia. He did a PET scan which showed decreased metabolic activity in regions of atrophy in the anterior frontal lobes, which confounds comparison of metabolism patterns with normal age-matched individuals. No pattern of hypometabolic changes to suggest a specific type of dementia on this exam. On her last visit with him in 65/2015, his assessment was Presumed frontotemporal dementia, serial imaging demonstrated frontal atrophy bilaterally. PET was inconclusive due to pre-existing frontal atrophy. MOCA score stable at 28/30. He suggested repeating Neuropsychological testing. They discussed that SSRIs may be helpful for behavioral abnormalities. She had been taking Fetzima at that time, and was switched to Pristiq because she was reporting different side effects. She has been seeing Dr. Evelene CroonKaur for many years and reports trying different medications. Dr. Evelene CroonKaur had noted that she has cognitive impairment caused by frontotemporal dementia and depression. Depression has been treatment-resistant. She has extreme profound inability to sustain attention, process information, modulate information. Toni [name] reports that for the past few months, she had difficulty getting out of bed. She was tearful in the office today, stating she is "totally afraid to make commitments" and says "I have no purpose in life." She went from no desire to do anything, to extreme anxiety of having to do something. She asks about getting home therapy to help get her out of bed and go down to eat, otherwise she would stay in bed. Her fear is that she is getting worse and her children will see that this is how she is. She just wants to "give myself the best chance of getting my life back." She feels Pristiq does not seem to be working. She tried stimulants in the past but could not take them. She reports erratic sleep, she would  be sleep-deprived and get her days and nights confused, other times she would sleep for 2 straight days. She was started on a new medication one time and became so confused with hallucinations, she went to the wrong dentist office.   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 Results show that despite evidence she has mild bifrontal lobe atrophy on imaging, it was felt that she does NOT have frontotemporal dementia based on 2 evaluations that do not fit the typical pattern of frontotemporal dementia despite exhibiting consistent difficulty with complex planning/organizing. Hypotheses include that without serial imaging before 2011,"it is not possible if these changes coincided with her significant cognitive and behavioral changes or are long-standing. However, based on the probability of having "frontal symptoms" due to this finding, it is likely that the structural brain changes are related, to some extent, to her having significant change in functioning. After review of history, there are a few possibilities. She was diagnosed with hepatitis C for many decades and has not undergone treatment, is it possible that she has minimal hepatic encephalopathy? This could also be related to the patient's complaint of extreme fatigue and treatment resistant depression. Based on a very high level of personal stress in 2011, did the patient have  a "nervous breakdown" and her decompensation has been related solely to psychiatric stress specifically depression? There is literature to suggest atrophy in frontal brain areas secondary to depression. The diagnosis of FTD is unlikely." Recommendations included ongoing psychiatric evaluation/treatment, CBT, treatment for hepatitis C.  She has a history of migraines occurring 5  days a week, with pain behind her eyes or radiating from her neck, with uncomfortable pressure behind her eyes. Relpax helps if she takes it at the onset. If she wakes up with a migraine, she uses sumatriptan injections. She has chronic neck and back pain.    PAST MEDICAL HISTORY: Past Medical History:  Diagnosis Date  . Brachial neuritis or radiculitis NOS   . Depressive disorder, not elsewhere classified   . Essential and other specified forms of tremor   . Headache(784.0)   . Hepatitis C infection 12/05/2017   Treated and now resolved and "cured " as of early 2018  And     . Memory loss   . Microscopic hematuria    seed dr Patsi Sears in past  . Migraine, unspecified, without mention of intractable migraine without mention of status migrainosus   . Other specified disorders of rotator cuff syndrome of shoulder and allied disorders   . Vulvar pain    rx with valium supp    MEDICATIONS:  Outpatient Encounter Medications as of 01/12/2018  Medication Sig  . albuterol (PROVENTIL HFA;VENTOLIN HFA) 108 (90 Base) MCG/ACT inhaler Inhale 2 puffs into the lungs every 6 (six) hours as needed for wheezing or shortness of breath.  . ALPRAZolam (XANAX) 1 MG tablet Take 1 tablet (1 mg total) by mouth 4 (four) times daily as needed for anxiety (shakes).  . Alum Hydroxide-Mag Trisilicate (GAVISCON) 80-14.2 MG CHEW Chew by mouth. CHEW 2 TABLETS BY MOUTH BEFORE EACH MEAL AT BEDTIME AS NEEDED FOR INDIGESTION OR HEARTBURN  . cetirizine (ZYRTEC) 10 MG tablet Take 10 mg by mouth daily as needed for allergies.  Marland Kitchen desvenlafaxine (PRISTIQ) 50 MG 24 hr tablet Take 50 mg by mouth daily.  Marland Kitchen dextroamphetamine (DEXEDRINE SPANSULE) 15 MG 24 hr capsule Take 30-45 mg by mouth daily. 2 po q am and 1 po q noon  . estradiol (ESTRACE) 0.1 MG/GM vaginal cream Place 1 Applicatorful vaginally daily as needed.  Marland Kitchen FLUoxetine (PROZAC) 20 MG capsule Take 1 capsule by mouth daily.  Marland Kitchen gabapentin (NEURONTIN) 600 MG tablet Take 600  mg by mouth. TAKE 2 TABS (1200 MG) PO TID FOR HEEL PAIN  . ibuprofen (ADVIL,MOTRIN) 200 MG tablet Take 200 mg by mouth every 6 (six) hours as needed. TAKE 2 TABLETS (400MG ) BY MOUTH EVERY 6 HOURS FOR PAIN  . ketoconazole (NIZORAL) 2 % cream APPLY TO AFFECTED AREA TWICE A DAY  . levothyroxine (SYNTHROID, LEVOTHROID) 25 MCG tablet TAKE 1 TABLET (25 MCG TOTAL) BY MOUTH DAILY BEFORE BREAKFAST. TAKE ON AN EMPTY STOMACH  . methocarbamol (ROBAXIN) 500 MG tablet Take 1 tablet (500 mg total) by mouth every 6 (six) hours as needed for muscle spasms.  . Misc Natural Products (ESTROVEN + ENERGY MAX STRENGTH) TABS Take by mouth daily.  . Multiple Vitamins-Minerals (CENTRUM SILVER ADULT 50+) TABS Take 1 tablet by mouth daily.  Marland Kitchen oxyCODONE (OXYCONTIN) 10 mg 12 hr tablet Take 10 mg by mouth. Give 1 tablet by mouth every 6 hours routinely for pain  . polyethylene glycol (MIRALAX / GLYCOLAX) packet Take 17 g by mouth daily.  . SUMAtriptan (IMITREX) 25 MG tablet Take 1 tablet (25 mg total)  by mouth as needed for migraine. May repeat in 2 hours if headache persists or recurs.   No facility-administered encounter medications on file as of 01/12/2018.      ALLERGIES: Allergies  Allergen Reactions  . Codeine Nausea And Vomiting and Other (See Comments)    Shuts down GI tract    FAMILY HISTORY: Family History  Problem Relation Age of Onset  . Headache Mother   . Stroke Father        father passed away june17  . Diabetes Maternal Grandmother   . Other Son        Mother is deceased 2013 father moved to the area 2014.    SOCIAL HISTORY: Social History   Socioeconomic History  . Marital status: Divorced    Spouse name: Not on file  . Number of children: 3  . Years of education: Not on file  . Highest education level: Not on file  Occupational History  . Not on file  Social Needs  . Financial resource strain: Not on file  . Food insecurity:    Worry: Not on file    Inability: Not on file  .  Transportation needs:    Medical: Not on file    Non-medical: Not on file  Tobacco Use  . Smoking status: Former Smoker    Types: Cigarettes    Last attempt to quit: 05/23/1978    Years since quitting: 39.6  . Smokeless tobacco: Never Used  Substance and Sexual Activity  . Alcohol use: No    Alcohol/week: 0.0 standard drinks  . Drug use: No  . Sexual activity: Not on file  Lifestyle  . Physical activity:    Days per week: Not on file    Minutes per session: Not on file  . Stress: Not on file  Relationships  . Social connections:    Talks on phone: Not on file    Gets together: Not on file    Attends religious service: Not on file    Active member of club or organization: Not on file    Attends meetings of clubs or organizations: Not on file    Relationship status: Not on file  . Intimate partner violence:    Fear of current or ex partner: Not on file    Emotionally abused: Not on file    Physically abused: Not on file    Forced sexual activity: Not on file  Other Topics Concern  . Not on file  Social History Narrative   Does not sleep well at night   Lives alone   Has two cats   On Environmental managerDisability   College graduate previous worked for [employer]. On disability for depression and short-term memory problems. Dr. love.   She is gravida 3 para 3   Father passed away  June 17    REVIEW OF SYSTEMS: Constitutional: No fevers, chills, or sweats, generalized fatigue, change in appetite Eyes: No visual changes, double vision, eye pain Ear, nose and throat: No hearing loss, ear pain, nasal congestion, sore throat Cardiovascular: No chest pain, palpitations Respiratory:  No shortness of breath at rest or with exertion, wheezes GastrointestinaI: No nausea, vomiting, diarrhea, abdominal pain, fecal incontinence Genitourinary:  No dysuria, urinary retention or frequency Musculoskeletal:  + neck pain, back pain Integumentary: No rash, pruritus, skin lesions Neurological: as  above Psychiatric: + depression, insomnia, anxiety Endocrine: No palpitations, fatigue, diaphoresis, mood swings, change in appetite, change in weight, increased thirst Hematologic/Lymphatic:  No anemia, purpura, petechiae.  Allergic/Immunologic: no itchy/runny eyes, nasal congestion, recent allergic reactions, rashes  PHYSICAL EXAM: There were no vitals filed for this visit. General: tremulous, emotional, very verbose, difficult to redirect Head:  Normocephalic/atraumatic Neck: supple, + paraspinal tenderness, full range of motion Heart:  Regular rate and rhythm Lungs:  Clear to auscultation bilaterally Back: No paraspinal tenderness Skin/Extremities: No rash, no edema Neurological Exam: alert and oriented to person, place, and time. No aphasia or dysarthria. Fund of knowledge is appropriate.  Recent and remote memory are intact.  Attention and concentration are normal.    Able to name objects and repeat phrases. Cranial nerves: Pupils equal, round. Extraocular movements intact. No facial asymmetry. Motor: Moves all extremities symmetrically.  Gait narrow-based and steady  IMPRESSION: This is a 63 yo LH woman with a history of migraines, treatment-resistant depression, hepatitis C, who had initially presented for a diagnosis of presumed frontotemporal dementia. She has bilateral frontal atrophy on MRI, however PET scans have been inconclusive due to pre-existing atrophy. She underwent repeat Neuropsychological evaluation which did not indicate any evidence for frontotemporal dementia. It appears she has finally accepted that there is no evidence of FTD. She presents today reporting she is in a better place, she continues to be very verbose and difficult to redirect about her struggles with family issues. At the end of the visit, she states she called her insurance company and was told there are "no limits" to the Imitrex she can get. I discussed with her that we can only prescribe 10 a month,  otherwise patients would be more prone to medication overuse headaches. I discussed with her that she can seek a second opinion from a different neurologist if she wishes. She is also asking for a referral to a Neuro-ophthalmologist because her eyes and brain feel tired, I discussed seeing an ophthalmologist first, and if they recommend Neuro-ophtho, we can send referral. She agrees to continuing 10 Imitrex a month and will follow-up in 1 year.  Thank you for allowing me to participate in her care.  Please do not hesitate to call for any questions or concerns.  The duration of this appointment visit was 25 minutes of face-to-face time with the patient.  Greater than 50% of this time was spent in counseling, explanation of diagnosis, planning of further management, and coordination of care.   Patrcia Dolly, M.D.   CC: Dr. Fabian Sharp, Dr. [name]

## 2018-01-12 NOTE — Patient Instructions (Addendum)
Continue all your medications. Continue follow-up with Pain Management, Dr. Kaur, and your PCP. Follow-up in 1 year, call for any changes. 

## 2018-01-12 NOTE — Progress Notes (Deleted)
NEUROLOGY FOLLOW UP OFFICE NOTE  Toni [name] [id number]  DOB: 10/04/1955  HISTORY OF PRESENT ILLNESS: Toni [name] was seen in follow-up in the neurology clinic on 02/21/2017.  The patient was last seen 4 months ago for migraines. On her last visit, she was very emotional, verbose and difficult to redirect, concerned about pain and tenderness in her skull. She had a head CT done which she was very concerned about due to report of "enlarged anterior extra-axial CSF spaces could be secondary to prominent frontal atrophy versus chronic subdural effusions, focal hypodensity in the left frontal lobe white matter, suspected to represent chronic infarct, but new since 2014." We did an MRI brain with and without contrast to further evaluate these changes reported, I personally reviewed imaging which showed moderate generalized atrophy accounting for the prominence of CSF spaces, no acute infarct or hemorrhage, no focal lesion, no significant white matter disease seen. Prominent extra-axial CSF spaces are present without a discrete collection, no abnormal enhancement. She presents today stating she is in a better place. She continues to be tremulous and emotional. She is difficult to redirect and relates her difficulties with family issues, now with custody of her 1 year old grandson Toni [name], living in a townhouse with her ex-husband and grandson. She states "I'm still really smart, my memory was perfect" when she was able to close on the townhouse and do all the paperwork. She states she has made alterations in her life and that she is doing good and knows she needs to make changes in her life, "I'm thrilled with the amount of intelligence I have." She is excited because she was reading the terms and conditions of her townhome and could see "this is not right" and has an appointment with the manager of the complex. She states her problem is "my starter," she cannot get her starter in the  morning, taking her medications in the morning and going back to sleep, then waking up later on. Her schedule is irregular, she does not know when to stop. She then needs a day or two to rest. She "cannot stop finding that 7-letter word, spending 2 hours looking for it." She denies any further falls. She reporets her skull was fine for a while, but recently got worse. It is bad today because she did not take her gabapentin. She states her Pain specialist increased the dose. She continues to see her psychiatrist Dr. [name] regularly.   Aug 2019: left superior and inferior pubic rami fracture. -CT hip negative for fracture. -MRI confirms the presence of nondisplaced acute fractures of the left superior and inferior pubic rami. -MRI lumbar spine; spondylosis. -Continue with pain management. Lost of family issues, takes care of 5yo grandson Toni [name] Multiples Ribs fracture: There are mildly displaced fractures of the anterolateral aspects of the left eighth, ninth, and tenth ribs.  Incentive spirometry.  In past 1.5 weks, has been to court multiple times against her son;  Stress will cause skull pain, usually under control  Gabapentin 1 TID, neuropathy started going down her right side Had bilateral cataract sx done in Nov/Dec imitrex ; 3-4 days with barometric pressure changes, goes weeks without it; last was past week in between court  Hast to take morning meds for her "starter," then sets alarm for an hour later, which heps her Friend comes 2 mornings a week to help with Toni [name] to get him to bus I have been accused of having dementia, I don not have dementia, no missed  bills Only question she has for me is when she fell on skull Pain on right side when lying on side, screams in pain, only in bed, got new bed     HPI: This is a 63 yo LH woman with a history of migraines, treatment-resistant depression, anxiety, hepatitis C, who had initially presented with a presumed diagnosis of  frontotemporal dementia. She had previously been seeing neurologist Dr. [name], then she saw Dr. [name] at [facility] one time, followed by 2 visits with Dr. [name] at [facility]. I will summarize her history for our records. She reports she was high-performance, doing multiple things at the same time, until 2011 when she started having more difficulties at work. She had been unable to do things she used to do. She started having problems sleeping. She denied any problems with her memory. She had lack of stamina, word-finding difficulties, and had been going through stress with her son at that time. She had 2 brain MRI studies done (05/08/09 and 02/24/12) which I personally reviewed, showing mild atrophy in the bilateral frontal lobes. She had a PET scan in 2011 reported as normal. She underwent Neuropsychological evaluation with Dr. [name] in 2011 which did not identify areas of cognitive impairment. Her current level of emotional distress seems far higher than her reported longtime baseline of what sounds like mild depression and generalized anxiety. Recommendation was for continued psychological counseling and psychiatric services.  On review of Dr. [name] notes, her MOCA score was normal 28/30. He stated that she does not appear to the the disinhibition, apathy, or language disorder on exam which would be typical of frontotemporal dementia. He did a PET scan which showed decreased metabolic activity in regions of atrophy in the anterior frontal lobes, which confounds comparison of metabolism patterns with normal age-matched individuals. No pattern of hypometabolic changes to suggest a specific type of dementia on this exam. On her last visit with him in 10/2013, his assessment was Presumed frontotemporal dementia, serial imaging demonstrated frontal atrophy bilaterally. PET was inconclusive due to pre-existing frontal atrophy. MOCA score stable at 28/30. He suggested repeating Neuropsychological testing. They  discussed that SSRIs may be helpful for behavioral abnormalities. She had been taking Fetzima at that time, and was switched to Pristiq because she was reporting different side effects. She has been seeing Dr. [name] for many years and reports trying different medications. Dr. [name] had noted that she has cognitive impairment caused by frontotemporal dementia and depression. Depression has been treatment-resistant. She has extreme profound inability to sustain attention, process information, modulate information. [name] reports that for the past few months, she had difficulty getting out of bed. She was tearful in the office today, stating she is "totally afraid to make commitments" and says "I have no purpose in life." She went from no desire to do anything, to extreme anxiety of having to do something. She asks about getting home therapy to help get her out of bed and go down to eat, otherwise she would stay in bed. Her fear is that she is getting worse and her children will see that this is how she is. She just wants to "give myself the best chance of getting my life back." She feels Pristiq does not seem to be working. She tried stimulants in the past but could not take them. She reports erratic sleep, she would be sleep-deprived and get her days and nights confused, other times she would sleep for 2 straight days. She was started on a  new medication one time and became so confused with hallucinations, she went to the wrong dentist office.   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 Results show that despite evidence she has mild bifrontal lobe atrophy on imaging, it was felt that she does NOT have frontotemporal dementia based on 2 evaluations that do not fit the typical pattern of frontotemporal dementia  despite exhibiting consistent difficulty with complex planning/organizing. Hypotheses include that without serial imaging before 2011,"it is not possible if these changes coincided with her significant cognitive and behavioral changes or are long-standing. However, based on the probability of having "frontal symptoms" due to this finding, it is likely that the structural brain changes are related, to some extent, to her having significant change in functioning. After review of history, there are a few possibilities. She was diagnosed with hepatitis C for many decades and has not undergone treatment, is it possible that she has minimal hepatic encephalopathy? This could also be related to the patient's complaint of extreme fatigue and treatment resistant depression. Based on a very high level of personal stress in 2011, did the patient have a "nervous breakdown" and her decompensation has been related solely to psychiatric stress specifically depression? There is literature to suggest atrophy in frontal brain areas secondary to depression. The diagnosis of FTD is unlikely." Recommendations included ongoing psychiatric evaluation/treatment, CBT, treatment for hepatitis C.  She has a history of migraines occurring 5 days a week, with pain behind her eyes or radiating from her neck, with uncomfortable pressure behind her eyes. Relpax helps if she takes it at the onset. If she wakes up with a migraine, she uses sumatriptan injections. She has chronic neck and back pain.    PAST MEDICAL HISTORY: Past Medical History:  Diagnosis Date  . Brachial neuritis or radiculitis NOS   . Depressive disorder, not elsewhere classified   . Essential and other specified forms of tremor   . Headache(784.0)   . Hepatitis C infection 12/05/2017   Treated and now resolved and "cured " as of early 2018  And     . Memory loss   . Microscopic hematuria    [name] in past  . Migraine, unspecified, without mention of  intractable migraine without mention of status migrainosus   . Other specified disorders of rotator cuff syndrome of shoulder and allied disorders   . Vulvar pain    rx with valium supp    MEDICATIONS:  Outpatient Encounter Medications as of 01/12/2018  Medication Sig  . albuterol (PROVENTIL HFA;VENTOLIN HFA) 108 (90 Base) MCG/ACT inhaler Inhale 2 puffs into the lungs every 6 (six) hours as needed for wheezing or shortness of breath.  . ALPRAZolam (XANAX) 1 MG tablet Take 1 tablet (1 mg total) by mouth 4 (four) times daily as needed for anxiety (shakes).  . Alum Hydroxide-Mag Trisilicate (GAVISCON) 80-14.2 MG CHEW Chew by mouth. CHEW 2 TABLETS BY MOUTH BEFORE EACH MEAL AT BEDTIME AS NEEDED FOR INDIGESTION OR HEARTBURN  . cetirizine (ZYRTEC) 10 MG tablet Take 10 mg by mouth daily as needed for allergies.  Marland Kitchen. desvenlafaxine (PRISTIQ) 50 MG 24 hr tablet Take 50 mg by mouth daily.  Marland Kitchen. dextroamphetamine (DEXEDRINE SPANSULE) 15 MG 24 hr capsule Take 30-45 mg by mouth daily. 2 po q am and 1 po q noon  . estradiol (ESTRACE) 0.1 MG/GM vaginal cream Place 1  Applicatorful vaginally daily as needed.  Marland Kitchen FLUoxetine (PROZAC) 20 MG capsule Take 1 capsule by mouth daily.  Marland Kitchen gabapentin (NEURONTIN) 600 MG tablet Take 600 mg by mouth. TAKE 2 TABS (1200 MG) PO TID FOR HEEL PAIN  . ibuprofen (ADVIL,MOTRIN) 200 MG tablet Take 200 mg by mouth every 6 (six) hours as needed. TAKE 2 TABLETS (400MG ) BY MOUTH EVERY 6 HOURS FOR PAIN  . ketoconazole (NIZORAL) 2 % cream APPLY TO AFFECTED AREA TWICE A DAY  . levothyroxine (SYNTHROID, LEVOTHROID) 25 MCG tablet TAKE 1 TABLET (25 MCG TOTAL) BY MOUTH DAILY BEFORE BREAKFAST. TAKE ON AN EMPTY STOMACH  . methocarbamol (ROBAXIN) 500 MG tablet Take 1 tablet (500 mg total) by mouth every 6 (six) hours as needed for muscle spasms.  . Misc Natural Products (ESTROVEN + ENERGY MAX STRENGTH) TABS Take by mouth daily.  . Multiple Vitamins-Minerals (CENTRUM SILVER ADULT 50+) TABS Take 1 tablet  by mouth daily.  Marland Kitchen oxyCODONE (OXYCONTIN) 10 mg 12 hr tablet Take 10 mg by mouth. Give 1 tablet by mouth every 6 hours routinely for pain  . polyethylene glycol (MIRALAX / GLYCOLAX) packet Take 17 g by mouth daily.  . SUMAtriptan (IMITREX) 25 MG tablet Take 1 tablet (25 mg total) by mouth as needed for migraine. May repeat in 2 hours if headache persists or recurs.   No facility-administered encounter medications on file as of 01/12/2018.      ALLERGIES: Allergies  Allergen Reactions  . Codeine Nausea And Vomiting and Other (See Comments)    Shuts down GI tract    FAMILY HISTORY: Family History  Problem Relation Age of Onset  . Headache Mother   . Stroke Father        father passed away june17  . Diabetes Maternal Grandmother   . Other Son        Mother is deceased 05/16/11 father moved to the area 05-15-2012.    SOCIAL HISTORY: Social History   Socioeconomic History  . Marital status: Divorced    Spouse name: Not on file  . Number of children: 3  . Years of education: Not on file  . Highest education level: Not on file  Occupational History  . Not on file  Social Needs  . Financial resource strain: Not on file  . Food insecurity:    Worry: Not on file    Inability: Not on file  . Transportation needs:    Medical: Not on file    Non-medical: Not on file  Tobacco Use  . Smoking status: Former Smoker    Types: Cigarettes    Last attempt to quit: 05/23/1978    Years since quitting: 39.6  . Smokeless tobacco: Never Used  Substance and Sexual Activity  . Alcohol use: No    Alcohol/week: 0.0 standard drinks  . Drug use: No  . Sexual activity: Not on file  Lifestyle  . Physical activity:    Days per week: Not on file    Minutes per session: Not on file  . Stress: Not on file  Relationships  . Social connections:    Talks on phone: Not on file    Gets together: Not on file    Attends religious service: Not on file    Active member of club or organization: Not on file     Attends meetings of clubs or organizations: Not on file    Relationship status: Not on file  . Intimate partner violence:    Fear of current or  ex partner: Not on file    Emotionally abused: Not on file    Physically abused: Not on file    Forced sexual activity: Not on file  Other Topics Concern  . 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 On disability for depression and short-term memory problems. Dr. love.   She is gravida 3 para 3   Father passed away  07/18/2022   REVIEW OF SYSTEMS: Constitutional: No fevers, chills, or sweats, generalized fatigue, change in appetite Eyes: No visual changes, double vision, eye pain Ear, nose and throat: No hearing loss, ear pain, nasal congestion, sore throat Cardiovascular: No chest pain, palpitations Respiratory:  No shortness of breath at rest or with exertion, wheezes GastrointestinaI: No nausea, vomiting, diarrhea, abdominal pain, fecal incontinence Genitourinary:  No dysuria, urinary retention or frequency Musculoskeletal:  + neck pain, back pain Integumentary: No rash, pruritus, skin lesions Neurological: as above Psychiatric: + depression, insomnia, anxiety Endocrine: No palpitations, fatigue, diaphoresis, mood swings, change in appetite, change in weight, increased thirst Hematologic/Lymphatic:  No anemia, purpura, petechiae. Allergic/Immunologic: no itchy/runny eyes, nasal congestion, recent allergic reactions, rashes  PHYSICAL EXAM: There were no vitals filed for this visit. General: tremulous, emotional, very verbose, difficult to redirect Head:  Normocephalic/atraumatic Neck: supple, + paraspinal tenderness, full range of motion Heart:  Regular rate and rhythm Lungs:  Clear to auscultation bilaterally Back: No paraspinal tenderness Skin/Extremities: No rash, no edema Neurological Exam: alert and  oriented to person, place, and time. No aphasia or dysarthria. Fund of knowledge is appropriate.  Recent and remote memory are intact.  Attention and concentration are normal.    Able to name objects and repeat phrases. Cranial nerves: Pupils equal, round. Extraocular movements intact. No facial asymmetry. Motor: Moves all extremities symmetrically.  Gait narrow-based and steady  IMPRESSION: This is a 63 yo LH woman with a history of migraines, treatment-resistant depression, hepatitis C, who had initially presented for a diagnosis of presumed frontotemporal dementia. She has bilateral frontal atrophy on MRI, however PET scans have been inconclusive due to pre-existing atrophy. She underwent repeat Neuropsychological evaluation which did not indicate any evidence for frontotemporal dementia. It appears she has finally accepted that there is no evidence of FTD. She presents today reporting she is in a better place, she continues to be very verbose and difficult to redirect about her struggles with family issues. At the end of the visit, she states she called her insurance company and was told there are "no limits" to the Imitrex she can get. I discussed with her that we can only prescribe 10 a month, otherwise patients would be more prone to medication overuse headaches. I discussed with her that she can seek a second opinion from a different neurologist if she wishes. She is also asking for a referral to a Neuro-ophthalmologist because her eyes and brain feel tired, I discussed seeing an ophthalmologist first, and if they recommend Neuro-ophtho, we can send referral. She agrees to continuing 10 Imitrex a month and will follow-up in 1 year.  Thank you for allowing me to participate in her care.  Please do not hesitate to call for any questions or concerns.  The duration of this appointment visit was 25 minutes of face-to-face time with the patient.  Greater than 50% of this time was spent in counseling,  explanation of diagnosis, planning of further management, and coordination of care.   Patrcia DollyKaren Aquino, M.D.   CC: Dr. Fabian SharpPanosh, Dr. Evelene CroonKaur

## 2018-01-12 NOTE — Progress Notes (Signed)
NEUROLOGY FOLLOW UP OFFICE NOTE  Toni [name] [id number]  DOB: 1955-01-04  HISTORY OF PRESENT ILLNESS: Toni [name] was seen in follow-up in the neurology clinic on 01/12/2018.  The patient was last seen a year ago for migraines. She is again very verbose in the office, she is under a lot of stress from family issues and is trying to get custody of her 65 year old grandson, Toni [name]. She relates incidents over the past year with her family, she has been busy going to court multiple times the past 1.5 weeks against her son, which has been hard on her. She mostly has headaches with the skull pain occurring during times of stress and changes in barometric pressure, lasting 3-4 days, sometimes she would go weeks without migraines. She has prn oral Imitrex and has [place] injections for migraines upon awakening. She is asking about "the spaces widening" in her brain, her last MRI brain in 2018 showed moderate generalized atrophy accounting for prominence of CSF spaces, no acute changes. She usually takes her medications in the morning to get her "starter" going, then sets an alarm an hour later to start her day. She has a friend coming twice a week to help get Toni [name] on the bus.  She had a fall in August 2019 and was found to have a left superior and inferior pubic rami fracture. Since her fall, she would have pain on her right side, she would sometimes scream in pain when she is in bed and had to get a new bed. She continues to see Pain Management and her psychiatrist Toni [name] on a regular basis. She is on gabapentin 300mg  TID which helps with the neuropathy and pain going down her right side. She denies any dizziness, diplopia, focal numbness/tingling.  HPI: This is a 63 yo LH woman with a history of migraines, treatment-resistant depression, anxiety, hepatitis C, who had initially presented with a presumed diagnosis of frontotemporal dementia. She had previously been seeing neurologist Dr.  Sandria [name], then she [name], followed by 2 [name]. I will summarize her history for our records. She reports she was high-performance, doing multiple things at the same time, until 2011 when she started having more difficulties at work. She had been unable to do things she used to do. She started having problems sleeping. She denied any problems with her memory. She had lack of stamina, word-finding difficulties, and had been going through stress with her son at that time. She had 2 brain MRI studies done (05/08/09 and 02/24/12) which I personally reviewed, showing mild atrophy in the bilateral frontal lobes. She had a PET scan in 2011 reported as normal. She underwent Neuropsychological evaluation with Dr. Leonides [name] in 2011 which did not identify areas of cognitive impairment. Her current level of emotional distress seems far higher than her reported longtime baseline of what sounds like mild depression and generalized anxiety. Recommendation was for continued psychological counseling and psychiatric services.  On review of Dr. Cornelious [name]'s notes, her MOCA score was normal 28/30. He stated that she does not appear to the the disinhibition, apathy, or language disorder on exam which would be typical of frontotemporal dementia. He did a PET scan which showed decreased metabolic activity in regions of atrophy in the anterior frontal lobes, which confounds comparison of metabolism patterns with normal age-matched individuals. No pattern of hypometabolic changes to suggest a specific type of dementia on this exam. On her last visit  with him in 10/2013, his assessment was Presumed frontotemporal dementia, serial imaging demonstrated frontal atrophy bilaterally. PET was inconclusive due to pre-existing frontal atrophy. MOCA score stable at 28/30. He suggested repeating Neuropsychological testing. They discussed that SSRIs may be helpful for behavioral abnormalities. She had  been taking Fetzima at that time, and was switched to Pristiq because she was reporting different side effects. She has been seeing Toni [name] for many years and reports trying different medications. Toni [name] had noted that she has cognitive impairment caused by frontotemporal dementia and depression. Depression has been treatment-resistant. She has extreme profound inability to sustain attention, process information, modulate information. Toni [name] reports that for the past few months, she had difficulty getting out of bed. She was tearful in the office today, stating she is "totally afraid to make commitments" and says "I have no purpose in life." She went from no desire to do anything, to extreme anxiety of having to do something. She asks about getting home therapy to help get her out of bed and go down to eat, otherwise she would stay in bed. Her fear is that she is getting worse and her children will see that this is how she is. She just wants to "give myself the best chance of getting my life back." She feels Pristiq does not seem to be working. She tried stimulants in the past but could not take them. She reports erratic sleep, she would be sleep-deprived and get her days and nights confused, other times she would sleep for 2 straight days. She was started on a new medication one time and became so confused with hallucinations, she went to the wrong dentist office.   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 Results show that despite evidence she has mild bifrontal lobe atrophy on imaging, it was felt that she does NOT have frontotemporal dementia based on 2 evaluations that do not fit the typical pattern of frontotemporal dementia despite exhibiting consistent difficulty with complex  planning/organizing. Hypotheses include that without serial imaging before 2011,"it is not possible if these changes coincided with her significant cognitive and behavioral changes or are long-standing. However, based on the probability of having "frontal symptoms" due to this finding, it is likely that the structural brain changes are related, to some extent, to her having significant change in functioning. After review of history, there are a few possibilities. She was diagnosed with hepatitis C for many decades and has not undergone treatment, is it possible that she has minimal hepatic encephalopathy? This could also be related to the patient's complaint of extreme fatigue and treatment resistant depression. Based on a very high level of personal stress in 2011, did the patient have a "nervous breakdown" and her decompensation has been related solely to psychiatric stress specifically depression? There is literature to suggest atrophy in frontal brain areas secondary to depression. The diagnosis of FTD is unlikely." Recommendations included ongoing psychiatric evaluation/treatment, CBT, treatment for hepatitis C.  She has a history of migraines occurring 5 days a week, with pain behind her eyes or radiating from her neck, with uncomfortable pressure behind her eyes. Relpax helps if she takes it at the onset. If she wakes up with a migraine, she uses sumatriptan injections. She has chronic neck and back pain.    PAST MEDICAL HISTORY: Past Medical History:  Diagnosis Date  . Brachial neuritis  or radiculitis NOS   . Depressive disorder, not elsewhere classified   . Essential and other specified forms of tremor   . Headache(784.0)   . Hepatitis C infection 12/05/2017   Treated and now resolved and "cured " as of early 2018  And     . Memory loss   . Microscopic hematuria    seed dr Patsi Sears in past  . Migraine, unspecified, without mention of intractable migraine without mention of status  migrainosus   . Other specified disorders of rotator cuff syndrome of shoulder and allied disorders   . Vulvar pain    rx with valium supp    MEDICATIONS:  Outpatient Encounter Medications as of 01/12/2018  Medication Sig  . albuterol (PROVENTIL HFA;VENTOLIN HFA) 108 (90 Base) MCG/ACT inhaler Inhale 2 puffs into the lungs every 6 (six) hours as needed for wheezing or shortness of breath.  . ALPRAZolam (XANAX) 1 MG tablet Take 1 tablet (1 mg total) by mouth 4 (four) times daily as needed for anxiety (shakes).  . Alum Hydroxide-Mag Trisilicate (GAVISCON) 80-14.2 MG CHEW Chew by mouth. CHEW 2 TABLETS BY MOUTH BEFORE EACH MEAL AT BEDTIME AS NEEDED FOR INDIGESTION OR HEARTBURN  . cetirizine (ZYRTEC) 10 MG tablet Take 10 mg by mouth daily as needed for allergies.  Marland Kitchen desvenlafaxine (PRISTIQ) 50 MG 24 hr tablet Take 50 mg by mouth daily.  Marland Kitchen dextroamphetamine (DEXEDRINE SPANSULE) 15 MG 24 hr capsule Take 30-45 mg by mouth daily. 2 po q am and 1 po q noon  . estradiol (ESTRACE) 0.1 MG/GM vaginal cream Place 1 Applicatorful vaginally daily as needed.  Marland Kitchen FLUoxetine (PROZAC) 20 MG capsule Take 1 capsule by mouth daily.  Marland Kitchen gabapentin (NEURONTIN) 600 MG tablet Take 600 mg by mouth. TAKE 2 TABS (1200 MG) PO TID FOR HEEL PAIN  . ibuprofen (ADVIL,MOTRIN) 200 MG tablet Take 200 mg by mouth every 6 (six) hours as needed. TAKE 2 TABLETS (400MG ) BY MOUTH EVERY 6 HOURS FOR PAIN  . ketoconazole (NIZORAL) 2 % cream APPLY TO AFFECTED AREA TWICE A DAY  . levothyroxine (SYNTHROID, LEVOTHROID) 25 MCG tablet TAKE 1 TABLET (25 MCG TOTAL) BY MOUTH DAILY BEFORE BREAKFAST. TAKE ON AN EMPTY STOMACH  . methocarbamol (ROBAXIN) 500 MG tablet Take 1 tablet (500 mg total) by mouth every 6 (six) hours as needed for muscle spasms.  . Misc Natural Products (ESTROVEN + ENERGY MAX STRENGTH) TABS Take by mouth daily.  . Multiple Vitamins-Minerals (CENTRUM SILVER ADULT 50+) TABS Take 1 tablet by mouth daily.  Marland Kitchen oxyCODONE (OXYCONTIN) 10 mg  12 hr tablet Take 10 mg by mouth. Give 1 tablet by mouth every 6 hours routinely for pain  . polyethylene glycol (MIRALAX / GLYCOLAX) packet Take 17 g by mouth daily.  . SUMAtriptan (IMITREX) 25 MG tablet Take 1 tablet (25 mg total) by mouth as needed for migraine. May repeat in 2 hours if headache persists or recurs.  . SUMAtriptan 6 MG/0.5ML SOAJ Use 1 syringe as needed for migraines upon awakening. Do not use with imitrex tablet together.  . [DISCONTINUED] SUMAtriptan (IMITREX) 25 MG tablet Take 1 tablet (25 mg total) by mouth as needed for migraine. May repeat in 2 hours if headache persists or recurs.   No facility-administered encounter medications on file as of 01/12/2018.      ALLERGIES: Allergies  Allergen Reactions  . Codeine Nausea And Vomiting and Other (See Comments)    Shuts down GI tract    FAMILY HISTORY: Family History  Problem  Relation Age of Onset  . Headache Mother   . Stroke Father        father passed away june17  . Diabetes Maternal Grandmother   . Other Son        Mother is deceased 2011/05/02 father moved to the area 2012/05/01.    SOCIAL HISTORY: Social History   Socioeconomic History  . Marital status: Divorced    Spouse name: Not on file  . Number of children: 3  . Years of education: Not on file  . Highest education level: Not on file  Occupational History  . Not on file  Social Needs  . Financial resource strain: Not on file  . Food insecurity:    Worry: Not on file    Inability: Not on file  . Transportation needs:    Medical: Not on file    Non-medical: Not on file  Tobacco Use  . Smoking status: Former Smoker    Types: Cigarettes    Last attempt to quit: 05/23/1978    Years since quitting: 39.6  . Smokeless tobacco: Never Used  Substance and Sexual Activity  . Alcohol use: No    Alcohol/week: 0.0 standard drinks  . Drug use: No  . Sexual activity: Not on file  Lifestyle  . Physical activity:    Days per week: Not on file    Minutes per  session: Not on file  . Stress: Not on file  Relationships  . Social connections:    Talks on phone: Not on file    Gets together: Not on file    Attends religious service: Not on file    Active member of club or organization: Not on file    Attends meetings of clubs or organizations: Not on file    Relationship status: Not on file  . Intimate partner violence:    Fear of current or ex partner: Not on file    Emotionally abused: Not on file    Physically abused: Not on file    Forced sexual activity: Not on file  Other Topics Concern  . 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 On disability for depression and short-term memory problems. Dr. love.   She is gravida 3 para 3   Father passed away  06/26/22   REVIEW OF SYSTEMS: Constitutional: No fevers, chills, or sweats, generalized fatigue, change in appetite Eyes: No visual changes, double vision, eye pain Ear, nose and throat: No hearing loss, ear pain, nasal congestion, sore throat Cardiovascular: No chest pain, palpitations Respiratory:  No shortness of breath at rest or with exertion, wheezes GastrointestinaI: No nausea, vomiting, diarrhea, abdominal pain, fecal incontinence Genitourinary:  No dysuria, urinary retention or frequency Musculoskeletal:  + neck pain, back pain Integumentary: No rash, pruritus, skin lesions Neurological: as above Psychiatric: + depression, insomnia, anxiety Endocrine: No palpitations, fatigue, diaphoresis, mood swings, change in appetite, change in weight, increased thirst Hematologic/Lymphatic:  No anemia, purpura, petechiae. Allergic/Immunologic: no itchy/runny eyes, nasal congestion, recent allergic reactions, rashes  PHYSICAL EXAM: Vitals:   01/12/18 1421  BP: 100/62  SpO2: 97%   General: not in acute distress, again very verbose and difficult to redirect  during today's visit Head:  Normocephalic/atraumatic Neck: supple, no paraspinal tenderness, full range of motion Heart:  Regular rate and rhythm Lungs:  Clear to  auscultation bilaterally Back: No paraspinal tenderness Skin/Extremities: No rash, no edema Neurological Exam: alert and oriented to person, place, and time. No aphasia or dysarthria. Fund of knowledge is appropriate.  Recent and remote memory are intact.  Attention and concentration are normal.    Able to name objects and repeat phrases. Cranial nerves: Pupils equal, round. Extraocular movements intact. No facial asymmetry. Motor: 5/5 throughout, no pronator drift. Sensation intact to light touch. No incoordination on finger to nose testing. Gait: narrow-based and steady, mild difficulty with tandem walk. No ataxia.  IMPRESSION: This is a 63 yo LH woman with a history of migraines, treatment-resistant depression, hepatitis C, who had initially presented for a diagnosis of presumed frontotemporal dementia. She has bilateral frontal atrophy on MRI, however PET scans have been inconclusive due to pre-existing atrophy. She underwent repeat Neuropsychological evaluation which did not indicate any evidence for frontotemporal dementia. She has finally accepted the diagnosis and states today that she does not think she has dementia. She asked about her MRI brain, no indication to repeat study, her neurological exam is stable. She is on prn Imitrex for migraines and knows to minimize to 2-3 a week to avoid rebound headaches. She has prn [place] Imitrex for migraines upon awakening. Refills sent. She continues to deal with a lot of family stressors, continue follow-up with Psychiatry and Pain Management. Follow-up in 1 year, she knows to call for any changes.   Thank you for allowing me to participate in her care.  Please do not hesitate to call for any questions or concerns.  The duration of this appointment visit was 25 minutes of face-to-face time with  the patient.  Greater than 50% of this time was spent in counseling, explanation of diagnosis, planning of further management, and coordination of care.   Patrcia Dolly, M.D.   CC: Dr. Fabian Sharp, Dr. [name]

## 2018-01-15 ENCOUNTER — Encounter: Payer: Self-pay | Admitting: Neurology

## 2018-01-15 NOTE — Patient Instructions (Signed)
Continue all your medications. Continue follow-up with Pain Management, Dr. [name], and your PCP. Follow-up in 1 year, call for any changes.

## 2018-01-19 NOTE — Progress Notes (Signed)
There were 2 encounters opened for this visit, see other encounter.

## 2018-02-11 ENCOUNTER — Other Ambulatory Visit: Payer: Self-pay | Admitting: Internal Medicine

## 2018-02-21 ENCOUNTER — Ambulatory Visit: Payer: 59 | Admitting: Neurology

## 2018-02-23 ENCOUNTER — Telehealth: Payer: Self-pay | Admitting: Internal Medicine

## 2018-02-23 NOTE — Telephone Encounter (Unsigned)
Copied from CRM [id number]. Topic: Quick Communication - See Telephone Encounter >> Feb 23, 2018  2:36 PM Jens Som A wrote: CRM for notification. See Telephone encounter for: 02/23/18.  Patient is calling because she say Dr. Fabian Sharp in November Dr. Fabian Sharp advised her to have her blood done before her 03/05/18 Please advise [phone]

## 2018-02-23 NOTE — Telephone Encounter (Signed)
Call was transferred to me from the [facility] as pt is very upset about many things. She very much dislikes the call center and states she should have been advised this was going to happen. She says no one told her she would not be able to call our office directly any longer. She feels this should have been explained to her. She thought this change was very recent and when I attempted to advise her it had been well over a year she was argumentative about the time frame. She would not let me explain things to her and kept over speaking me and interrupting. She is upset and keeps telling me she is having to take care of her grandson and has lot of personal issues and doesn't have time for all this. She wants to have her labs done prior to her CPE and kept apologizing for not calling sooner, when I attempted to assure her it was not a problem she snapped at me and said how would I know as I knew nothing about her medical conditions or history. She seemed very agitated during the call. She repeated herself and her personal issues multiple times during the call. I was finally able to explain to her I would send a message to Dr. Fabian Sharp to let her know about her concerns.    Pt is requesting labs prior to CPE, I do see orders placed.  Dr. Fabian Sharp - Please advise if all orders have been placed. Thanks!

## 2018-02-23 NOTE — Telephone Encounter (Signed)
Orders were in the system and already placed   Please give her appt  Before her cpx

## 2018-02-27 NOTE — Telephone Encounter (Signed)
Spoke with pt and advised lab orders have been placed and she can come in for appt. Appt has been scheduled.

## 2018-02-28 ENCOUNTER — Telehealth: Payer: Self-pay | Admitting: Internal Medicine

## 2018-02-28 ENCOUNTER — Other Ambulatory Visit (INDEPENDENT_AMBULATORY_CARE_PROVIDER_SITE_OTHER): Payer: 59

## 2018-02-28 DIAGNOSIS — E039 Hypothyroidism, unspecified: Secondary | ICD-10-CM | POA: Diagnosis not present

## 2018-02-28 DIAGNOSIS — Z79899 Other long term (current) drug therapy: Secondary | ICD-10-CM | POA: Diagnosis not present

## 2018-02-28 DIAGNOSIS — R7989 Other specified abnormal findings of blood chemistry: Secondary | ICD-10-CM | POA: Diagnosis not present

## 2018-02-28 DIAGNOSIS — Z0000 Encounter for general adult medical examination without abnormal findings: Secondary | ICD-10-CM

## 2018-02-28 DIAGNOSIS — E038 Other specified hypothyroidism: Secondary | ICD-10-CM

## 2018-02-28 DIAGNOSIS — E785 Hyperlipidemia, unspecified: Secondary | ICD-10-CM

## 2018-02-28 DIAGNOSIS — M5481 Occipital neuralgia: Secondary | ICD-10-CM | POA: Diagnosis not present

## 2018-02-28 LAB — CBC WITH DIFFERENTIAL/PLATELET
Basophils Absolute: 0 10*3/uL (ref 0.0–0.1)
Basophils Relative: 0.6 % (ref 0.0–3.0)
EOS PCT: 1.9 % (ref 0.0–5.0)
Eosinophils Absolute: 0.1 10*3/uL (ref 0.0–0.7)
HEMATOCRIT: 41.1 % (ref 36.0–46.0)
HEMOGLOBIN: 13.6 g/dL (ref 12.0–15.0)
LYMPHS PCT: 31.9 % (ref 12.0–46.0)
Lymphs Abs: 1.6 10*3/uL (ref 0.7–4.0)
MCHC: 33 g/dL (ref 30.0–36.0)
MCV: 87.5 fl (ref 78.0–100.0)
MONO ABS: 0.5 10*3/uL (ref 0.1–1.0)
MONOS PCT: 9.2 % (ref 3.0–12.0)
Neutro Abs: 2.9 10*3/uL (ref 1.4–7.7)
Neutrophils Relative %: 56.4 % (ref 43.0–77.0)
Platelets: 221 10*3/uL (ref 150.0–400.0)
RBC: 4.69 Mil/uL (ref 3.87–5.11)
RDW: 13.6 % (ref 11.5–15.5)
WBC: 5.2 10*3/uL (ref 4.0–10.5)

## 2018-02-28 LAB — LIPID PANEL
CHOL/HDL RATIO: 4
Cholesterol: 204 mg/dL — ABNORMAL HIGH (ref 0–200)
HDL: 52.4 mg/dL (ref >39.00)
LDL Cholesterol: 139 mg/dL — ABNORMAL HIGH (ref 0–99)
NONHDL: 151.45
Triglycerides: 64 mg/dL (ref 0.0–149.0)
VLDL: 12.8 mg/dL (ref 0.0–40.0)

## 2018-02-28 LAB — BASIC METABOLIC PANEL
BUN: 26 mg/dL — AB (ref 6–23)
CO2: 31 meq/L (ref 19–32)
Calcium: 9.3 mg/dL (ref 8.4–10.5)
Chloride: 103 mEq/L (ref 96–112)
Creatinine, Ser: 0.85 mg/dL (ref 0.40–1.20)
GFR: 67.25 mL/min (ref >60.00)
GLUCOSE: 92 mg/dL (ref 70–99)
Potassium: 4.9 mEq/L (ref 3.5–5.1)
Sodium: 141 mEq/L (ref 135–145)

## 2018-02-28 LAB — HEPATIC FUNCTION PANEL
ALBUMIN: 4.4 g/dL (ref 3.5–5.2)
ALT: 15 U/L (ref 0–35)
AST: 19 U/L (ref 0–37)
Alkaline Phosphatase: 80 U/L (ref 39–117)
BILIRUBIN TOTAL: 0.3 mg/dL (ref 0.2–1.2)
Bilirubin, Direct: 0.1 mg/dL (ref 0.0–0.3)
Total Protein: 7.2 g/dL (ref 6.0–8.3)

## 2018-02-28 NOTE — Telephone Encounter (Signed)
Pt state that she has a ganglion cyst on her left foot and a fracture and she would like to know if she could get her handicap placard extended she thinks that it will run out sometime in March.  Pt is aware that she may need to fill out another form to be able to obtain this.

## 2018-03-01 LAB — TSH: TSH: 3.61 u[IU]/mL (ref 0.35–4.50)

## 2018-03-01 LAB — T4, FREE: Free T4: 0.7 ng/dL (ref 0.60–1.60)

## 2018-03-02 NOTE — Telephone Encounter (Signed)
We can advise  about this at your visit next week

## 2018-03-05 ENCOUNTER — Ambulatory Visit (INDEPENDENT_AMBULATORY_CARE_PROVIDER_SITE_OTHER): Payer: 59 | Admitting: Internal Medicine

## 2018-03-05 ENCOUNTER — Encounter: Payer: Self-pay | Admitting: Internal Medicine

## 2018-03-05 VITALS — BP 136/82 | HR 102 | Temp 98.7°F | Ht 61.75 in | Wt 197.0 lb

## 2018-03-05 DIAGNOSIS — Z0000 Encounter for general adult medical examination without abnormal findings: Secondary | ICD-10-CM | POA: Diagnosis not present

## 2018-03-05 DIAGNOSIS — E785 Hyperlipidemia, unspecified: Secondary | ICD-10-CM | POA: Diagnosis not present

## 2018-03-05 DIAGNOSIS — E038 Other specified hypothyroidism: Secondary | ICD-10-CM

## 2018-03-05 DIAGNOSIS — M79672 Pain in left foot: Secondary | ICD-10-CM

## 2018-03-05 DIAGNOSIS — Z79899 Other long term (current) drug therapy: Secondary | ICD-10-CM

## 2018-03-05 DIAGNOSIS — E039 Hypothyroidism, unspecified: Secondary | ICD-10-CM | POA: Diagnosis not present

## 2018-03-05 NOTE — Progress Notes (Signed)
Chief Complaint  Patient presents with  . Annual Exam    Pt would like a breast exam and would like to dicuss cyst on her foot     HPI: Patient  Toni [name]  63 y.o. comes in today for Preventive Health Care visit   takingh thyroid med  Not every day  Less than 50 % of time cause of external factors and  Has other meds  But doing ok  Foot left healed but has a  Small 3 x 6 mm ganglion cyst on bottom of ofoot  second web scace? On mri from fall 2019 dr Tonita Cong.  Still has pain when over does it and ocass swimmy headed after fall asks for renewal of handicapped  Placard cause of this . Overal  Doing better  Court hearings about custody of grand child ( ex husband etoh) son unstable  (?sud?) mom ion [place]  Followed by neurology  And psychiatry    Health Maintenance  Topic Date Due  . MAMMOGRAM  05/12/2018  . PAP SMEAR-Modifier  12/30/2018  . COLONOSCOPY  03/29/2026  . TETANUS/TDAP  07/20/2027  . INFLUENZA VACCINE  Completed  . Hepatitis C Screening  Completed  . HIV Screening  Completed   Health Maintenance Review LIFESTYLE:  Exercise:  Limited but getting better  Tobacco/ETS: no Alcohol:  no Sugar beverages:  little Sleep: off recnetly but 4-6 hours   Drug use: no HH of  going for cutrody of grandson cause   Father and      ROS: see hpi  GEN/ HEENT: No fever, significant weight changes sweats new  headaches vision problems hearing changes, CV/ PULM; No chest pain shortness of breath cough, syncope,edema  change in exercise tolerance. GI /GU: No adominal pain, vomiting, change in bowel habits. No blood in the stool. No significant GU symptoms. SKIN/HEME: ,no acute skin rashes suspicious lesions or bleeding. No lymphadenopathy, nodules, masses.  NEURO/ PSYCH:  No new sx memory and  Balance doing better  IMM/ Allergy: No unusual infections.  Allergy .   REST of 12 system review negative except as per HPI   Past Medical History:  Diagnosis Date  . Brachial  neuritis or radiculitis NOS   . Chronic hepatitis C ([place]) 10/05/2015  . Depressive disorder, not elsewhere classified   . Essential and other specified forms of tremor   . Fracture of 5th metatarsal 08/13/2017  . Fracture of multiple pubic rami ([place]) 08/13/2017  . Headache(784.0)   . Hepatitis C infection 12/05/2017   Treated and now resolved and "cured " as of early 2018  And     . Memory loss   . Microscopic hematuria    seed dr Gaynelle Arabian in past  . Migraine, unspecified, without mention of intractable migraine without mention of status migrainosus   . Multiple rib fractures 08/13/2017  . Other specified disorders of rotator cuff syndrome of shoulder and allied disorders   . Vulvar pain    rx with valium supp    Past Surgical History:  Procedure Laterality Date  . BREAST EXCISIONAL BIOPSY Right    No scar  . CESAREAN SECTION     x3  . SHOULDER ARTHROSCOPY W/ ROTATOR CUFF REPAIR Right 2004  . SHOULDER ARTHROSCOPY W/ ROTATOR CUFF REPAIR Left 2008    Family History  Problem Relation Age of Onset  . Headache Mother   . Stroke Father        father passed away 2022-07-18  . Diabetes Maternal  Grandmother   . Other Son        Mother is deceased 03/02/11 father moved to the area 2012-03-01.    Social History   Socioeconomic History  . Marital status: Divorced    Spouse name: Not on file  . Number of children: 3  . Years of education: Not on file  . Highest education level: Not on file  Occupational History  . Not on file  Social Needs  . Financial resource strain: Not on file  . Food insecurity:    Worry: Not on file    Inability: Not on file  . Transportation needs:    Medical: Not on file    Non-medical: Not on file  Tobacco Use  . Smoking status: Former Smoker    Types: Cigarettes    Last attempt to quit: 05/23/1978    Years since quitting: 39.8  . Smokeless tobacco: Never Used  Substance and Sexual Activity  . Alcohol use: No    Alcohol/week: 0.0 standard drinks  . Drug  use: No  . Sexual activity: Not on file  Lifestyle  . Physical activity:    Days per week: Not on file    Minutes per session: Not on file  . Stress: Not on file  Relationships  . Social connections:    Talks on phone: Not on file    Gets together: Not on file    Attends religious service: Not on file    Active member of club or organization: Not on file    Attends meetings of clubs or organizations: Not on file    Relationship status: Not on file  Other Topics Concern  . Not on file  Social History Narrative   Does not sleep well at night   Lives alone   Has two cats   On Manufacturing engineer previous worked for [employer] On disability for depression and short-term memory problems. Dr. love.   She is gravida 3 para 3   Father passed away  Jun 23, 2022    Outpatient Medications Prior to Visit  Medication Sig Dispense Refill  . albuterol (PROVENTIL HFA;VENTOLIN HFA) 108 (90 Base) MCG/ACT inhaler Inhale 2 puffs into the lungs every 6 (six) hours as needed for wheezing or shortness of breath. 1 Inhaler 0  . ALPRAZolam (XANAX) 1 MG tablet Take 1 tablet (1 mg total) by mouth 4 (four) times daily as needed for anxiety (shakes). 5 tablet 0  . Alum Hydroxide-Mag Trisilicate (GAVISCON) 11-91.4 MG CHEW Chew by mouth. CHEW 2 TABLETS BY MOUTH BEFORE EACH MEAL AT BEDTIME AS NEEDED FOR INDIGESTION OR HEARTBURN    . cetirizine (ZYRTEC) 10 MG tablet Take 10 mg by mouth daily as needed for allergies.    Marland Kitchen desvenlafaxine (PRISTIQ) 50 MG 24 hr tablet Take 50 mg by mouth daily.    Marland Kitchen dextroamphetamine (DEXEDRINE SPANSULE) 15 MG 24 hr capsule Take 30-45 mg by mouth daily. 2 po q am and 1 po q noon    . estradiol (ESTRACE) 0.1 MG/GM vaginal cream Place 1 Applicatorful vaginally daily as needed. 42.5 g 1  . FLUoxetine (PROZAC) 20 MG capsule Take 1 capsule by mouth daily.  0  . gabapentin (NEURONTIN) 600 MG tablet Take 600 mg by mouth. PO TID FOR HEEL PAIN    . ketoconazole (NIZORAL) 2 % cream  APPLY TO AFFECTED AREA TWICE A DAY 30 g 0  . levothyroxine (SYNTHROID, LEVOTHROID) 25 MCG tablet TAKE 1 TABLET (25 MCG TOTAL) BY  MOUTH DAILY BEFORE BREAKFAST. TAKE ON AN EMPTY STOMACH 30 tablet 1  . Misc Natural Products (ESTROVEN + ENERGY MAX STRENGTH) TABS Take by mouth daily.    . Multiple Vitamins-Minerals (CENTRUM SILVER ADULT 50+) TABS Take 1 tablet by mouth daily.    . SUMAtriptan (IMITREX) 25 MG tablet Take 1 tablet (25 mg total) by mouth as needed for migraine. May repeat in 2 hours if headache persists or recurs. 10 tablet 11  . SUMAtriptan 6 MG/0.5ML SOAJ Use 1 syringe as needed for migraines upon awakening. Do not use with imitrex tablet together. 5 Syringe 11  . HYDROcodone-ibuprofen (VICOPROFEN) 7.5-200 MG tablet     . ibuprofen (ADVIL,MOTRIN) 200 MG tablet Take 200 mg by mouth every 6 (six) hours as needed. TAKE 2 TABLETS (400MG) BY MOUTH EVERY 6 HOURS FOR PAIN    . polyethylene glycol (MIRALAX / GLYCOLAX) packet Take 17 g by mouth daily. (Patient not taking: Reported on 03/05/2018) 14 each 0  . methocarbamol (ROBAXIN) 500 MG tablet Take 1 tablet (500 mg total) by mouth every 6 (six) hours as needed for muscle spasms. (Patient not taking: Reported on 03/05/2018) 30 tablet 0  . oxyCODONE (OXYCONTIN) 10 mg 12 hr tablet Take 10 mg by mouth. Give 1 tablet by mouth every 6 hours routinely for pain     No facility-administered medications prior to visit.      EXAM:  BP 136/82 (BP Location: Right Arm, Patient Position: Sitting, Cuff Size: Large)   Pulse (!) 102   Temp 98.7 F (37.1 C) (Oral)   Ht 5' 1.75" (1.568 m)   Wt 197 lb (89.4 kg)   BMI 36.32 kg/m   Body mass index is 36.32 kg/m. Wt Readings from Last 3 Encounters:  03/05/18 197 lb (89.4 kg)  01/12/18 197 lb (89.4 kg)  01/12/18 197 lb (89.4 kg)    Physical Exam: Vital signs reviewed NAT:FTDD is a well-developed well-nourished alert cooperative    who appearsr stated age in no acute distress.  HEENT: normocephalic  atraumatic , Eyes: PERRL EOM's full, conjunctiva clear, Nares: paten,t no deformity discharge or tenderness., Ears: no deformity EAC's clear TMs with normal landmarks. Mouth: clear OP, no lesions, edema.  Moist mucous membranes. Dentition in adequate repair. NECK: supple without masses, thyromegaly or bruits. CHEST/PULM:  Clear to auscultation and percussion breath sounds equal no wheeze , rales or rhonchi. No chest wall deformities or tenderness. Breast: normal by inspection . No dimpling, discharge, masses, tenderness or discharge . CV: PMI is nondisplaced, S1 S2 no gallops, murmurs, rubs. Peripheral pulses are full without delay.No JVD.  ABDOMEN: Bowel sounds normal nontender  No guard or rebound, no hepato splenomegal no CVA tenderness.  Extremtities:  No clubbing cyanosis or edema, no acute joint swelling or redness no focal atrophy mild edema  right foot  Resolving bruises   No point tenderness  Left  No ulcer and pulses are normal  NEURO:  Oriented x3, cranial nerves 3-12 appear to be intact, no obvious focal weakness mil dintermittent  Tremor  SKIN: No acute rashes normal turgor, color, no bruising or petechiae. PSYCH: Oriented, good eye contact, no obvious depression  More  Confident some anxiety but not depressed appearing .  Talkative   LN: no cervical axillary  adenopathy  Lab Results  Component Value Date   WBC 5.2 02/28/2018   HGB 13.6 02/28/2018   HCT 41.1 02/28/2018   PLT 221.0 02/28/2018   GLUCOSE 92 02/28/2018   CHOL 204 (H) 02/28/2018  TRIG 64.0 02/28/2018   HDL 52.40 02/28/2018   LDLCALC 139 (H) 02/28/2018   ALT 15 02/28/2018   AST 19 02/28/2018   NA 141 02/28/2018   K 4.9 02/28/2018   CL 103 02/28/2018   CREATININE 0.85 02/28/2018   BUN 26 (H) 02/28/2018   CO2 31 02/28/2018   TSH 3.61 02/28/2018   HGBA1C 5.8 02/28/2017    BP Readings from Last 3 Encounters:  03/05/18 136/82  01/12/18 100/62  01/12/18 100/62    Lab results reviewed with patient    ASSESSMENT AND PLAN:  Discussed the following assessment and plan:  Visit for preventive health examination  Medication management - Plan: TSH  Subclinical hypothyroidism - taking intermittently although tsh in ULN range now   will recheckn in 3 mos   will give trial of daily low dose - Plan: TSH  Hyperlipidemia, unspecified hyperlipidemia type - lsi  Foot pain, left - sp fracture  dc from ortho  some pain withover use and has small ganglion expect improvement Address bone health at future visit     Signed a temp handicapped  For 6 months  As she is improving  Patient Care Team: Stephanie Mcglone, Standley Brooking, MD as PCP - General (Internal Medicine) Jovita Gamma, MD as Consulting Physician (Neurosurgery) Carolan Clines, MD (Inactive) as Consulting Physician (Urology) Chucky May, MD as Consulting Physician (Psychiatry) Cameron Sprang, MD as Consulting Physician (Neurology) Patient Instructions  Your labs are good enough  cholesterol  Continue attention  To  lifestyle intervention healthy eating and exercise .  Ok to  Have another  handicapped .  Sticker.     Try taking the thyroid medication every day even if  Cant take it without food.    Recheck tsh in  4-6 months and then ROV .     Preventive Care 40-64 Years, Female Preventive care refers to lifestyle choices and visits with your health care provider that can promote health and wellness. What does preventive care include?   A yearly physical exam. This is also called an annual well check.  Dental exams once or twice a year.  Routine eye exams. Ask your health care provider how often you should have your eyes checked.  Personal lifestyle choices, including: ? Daily care of your teeth and gums. ? Regular physical activity. ? Eating a healthy diet. ? Avoiding tobacco and drug use. ? Limiting alcohol use. ? Practicing safe sex. ? Taking low-dose aspirin daily starting at age 44. ? Taking vitamin and mineral  supplements as recommended by your health care provider. What happens during an annual well check? The services and screenings done by your health care provider during your annual well check will depend on your age, overall health, lifestyle risk factors, and family history of disease. Counseling Your health care provider may ask you questions about your:  Alcohol use.  Tobacco use.  Drug use.  Emotional well-being.  Home and relationship well-being.  Sexual activity.  Eating habits.  Work and work Statistician.  Method of birth control.  Menstrual cycle.  Pregnancy history. Screening You may have the following tests or measurements:  Height, weight, and BMI.  Blood pressure.  Lipid and cholesterol levels. These may be checked every 5 years, or more frequently if you are over 6 years old.  Skin check.  Lung cancer screening. You may have this screening every year starting at age 46 if you have a 30-pack-year history of smoking and currently smoke or have quit within the  past 15 years.  Colorectal cancer screening. All adults should have this screening starting at age 40 and continuing until age 61. Your health care provider may recommend screening at age 25. You will have tests every 1-10 years, depending on your results and the type of screening test. People at increased risk should start screening at an earlier age. Screening tests may include: ? Guaiac-based fecal occult blood testing. ? Fecal immunochemical test (FIT). ? Stool DNA test. ? Virtual colonoscopy. ? Sigmoidoscopy. During this test, a flexible tube with a tiny camera (sigmoidoscope) is used to examine your rectum and lower colon. The sigmoidoscope is inserted through your anus into your rectum and lower colon. ? Colonoscopy. During this test, a long, thin, flexible tube with a tiny camera (colonoscope) is used to examine your entire colon and rectum.  Hepatitis C blood test.  Hepatitis B blood  test.  Sexually transmitted disease (STD) testing.  Diabetes screening. This is done by checking your blood sugar (glucose) after you have not eaten for a while (fasting). You may have this done every 1-3 years.  Mammogram. This may be done every 1-2 years. Talk to your health care provider about when you should start having regular mammograms. This may depend on whether you have a family history of breast cancer.  BRCA-related cancer screening. This may be done if you have a family history of breast, ovarian, tubal, or peritoneal cancers.  Pelvic exam and Pap test. This may be done every 3 years starting at age 56. Starting at age 17, this may be done every 5 years if you have a Pap test in combination with an HPV test.  Bone density scan. This is done to screen for osteoporosis. You may have this scan if you are at high risk for osteoporosis. Discuss your test results, treatment options, and if necessary, the need for more tests with your health care provider. Vaccines Your health care provider may recommend certain vaccines, such as:  Influenza vaccine. This is recommended every year.  Tetanus, diphtheria, and acellular pertussis (Tdap, Td) vaccine. You may need a Td booster every 10 years.  Varicella vaccine. You may need this if you have not been vaccinated.  Zoster vaccine. You may need this after age 66.  Measles, mumps, and rubella (MMR) vaccine. You may need at least one dose of MMR if you were born in 1957 or later. You may also need a second dose.  Pneumococcal 13-valent conjugate (PCV13) vaccine. You may need this if you have certain conditions and were not previously vaccinated.  Pneumococcal polysaccharide (PPSV23) vaccine. You may need one or two doses if you smoke cigarettes or if you have certain conditions.  Meningococcal vaccine. You may need this if you have certain conditions.  Hepatitis A vaccine. You may need this if you have certain conditions or if you travel  or work in places where you may be exposed to hepatitis A.  Hepatitis B vaccine. You may need this if you have certain conditions or if you travel or work in places where you may be exposed to hepatitis B.  Haemophilus influenzae type b (Hib) vaccine. You may need this if you have certain conditions. Talk to your health care provider about which screenings and vaccines you need and how often you need them. This information is not intended to replace advice given to you by your health care provider. Make sure you discuss any questions you have with your health care provider. Document Released: 01/16/2015 Document Revised:  02/09/2017 Document Reviewed: 10/21/2014 Elsevier Interactive Patient Education  2019 [place] [employer]. Arriyana Rodell M.D.

## 2018-03-05 NOTE — Patient Instructions (Addendum)
Your labs are good enough  cholesterol  Continue attention  To  lifestyle intervention healthy eating and exercise .  Ok to  Have another  handicapped .  Sticker.     Try taking the thyroid medication every day even if  Cant take it without food.    Recheck tsh in  4-6 months and then ROV .     Preventive Care 40-64 Years, Female Preventive care refers to lifestyle choices and visits with your health care provider that can promote health and wellness. What does preventive care include?   A yearly physical exam. This is also called an annual well check.  Dental exams once or twice a year.  Routine eye exams. Ask your health care provider how often you should have your eyes checked.  Personal lifestyle choices, including: ? Daily care of your teeth and gums. ? Regular physical activity. ? Eating a healthy diet. ? Avoiding tobacco and drug use. ? Limiting alcohol use. ? Practicing safe sex. ? Taking low-dose aspirin daily starting at age 28. ? Taking vitamin and mineral supplements as recommended by your health care provider. What happens during an annual well check? The services and screenings done by your health care provider during your annual well check will depend on your age, overall health, lifestyle risk factors, and family history of disease. Counseling Your health care provider may ask you questions about your:  Alcohol use.  Tobacco use.  Drug use.  Emotional well-being.  Home and relationship well-being.  Sexual activity.  Eating habits.  Work and work Statistician.  Method of birth control.  Menstrual cycle.  Pregnancy history. Screening You may have the following tests or measurements:  Height, weight, and BMI.  Blood pressure.  Lipid and cholesterol levels. These may be checked every 5 years, or more frequently if you are over 23 years old.  Skin check.  Lung cancer screening. You may have this screening every year starting at age 42 if  you have a 30-pack-year history of smoking and currently smoke or have quit within the past 15 years.  Colorectal cancer screening. All adults should have this screening starting at age 72 and continuing until age 11. Your health care provider may recommend screening at age 14. You will have tests every 1-10 years, depending on your results and the type of screening test. People at increased risk should start screening at an earlier age. Screening tests may include: ? Guaiac-based fecal occult blood testing. ? Fecal immunochemical test (FIT). ? Stool DNA test. ? Virtual colonoscopy. ? Sigmoidoscopy. During this test, a flexible tube with a tiny camera (sigmoidoscope) is used to examine your rectum and lower colon. The sigmoidoscope is inserted through your anus into your rectum and lower colon. ? Colonoscopy. During this test, a long, thin, flexible tube with a tiny camera (colonoscope) is used to examine your entire colon and rectum.  Hepatitis C blood test.  Hepatitis B blood test.  Sexually transmitted disease (STD) testing.  Diabetes screening. This is done by checking your blood sugar (glucose) after you have not eaten for a while (fasting). You may have this done every 1-3 years.  Mammogram. This may be done every 1-2 years. Talk to your health care provider about when you should start having regular mammograms. This may depend on whether you have a family history of breast cancer.  BRCA-related cancer screening. This may be done if you have a family history of breast, ovarian, tubal, or peritoneal cancers.  Pelvic exam  and Pap test. This may be done every 3 years starting at age 25. Starting at age 4, this may be done every 5 years if you have a Pap test in combination with an HPV test.  Bone density scan. This is done to screen for osteoporosis. You may have this scan if you are at high risk for osteoporosis. Discuss your test results, treatment options, and if necessary, the need  for more tests with your health care provider. Vaccines Your health care provider may recommend certain vaccines, such as:  Influenza vaccine. This is recommended every year.  Tetanus, diphtheria, and acellular pertussis (Tdap, Td) vaccine. You may need a Td booster every 10 years.  Varicella vaccine. You may need this if you have not been vaccinated.  Zoster vaccine. You may need this after age 41.  Measles, mumps, and rubella (MMR) vaccine. You may need at least one dose of MMR if you were born in 1957 or later. You may also need a second dose.  Pneumococcal 13-valent conjugate (PCV13) vaccine. You may need this if you have certain conditions and were not previously vaccinated.  Pneumococcal polysaccharide (PPSV23) vaccine. You may need one or two doses if you smoke cigarettes or if you have certain conditions.  Meningococcal vaccine. You may need this if you have certain conditions.  Hepatitis A vaccine. You may need this if you have certain conditions or if you travel or work in places where you may be exposed to hepatitis A.  Hepatitis B vaccine. You may need this if you have certain conditions or if you travel or work in places where you may be exposed to hepatitis B.  Haemophilus influenzae type b (Hib) vaccine. You may need this if you have certain conditions. Talk to your health care provider about which screenings and vaccines you need and how often you need them. This information is not intended to replace advice given to you by your health care provider. Make sure you discuss any questions you have with your health care provider. Document Released: 01/16/2015 Document Revised: 02/09/2017 Document Reviewed: 10/21/2014 Elsevier Interactive Patient Education  2019 [employer].

## 2018-04-08 ENCOUNTER — Other Ambulatory Visit: Payer: Self-pay | Admitting: Internal Medicine

## 2018-04-09 ENCOUNTER — Ambulatory Visit: Payer: Self-pay

## 2018-04-09 ENCOUNTER — Encounter: Payer: Self-pay | Admitting: Internal Medicine

## 2018-04-09 ENCOUNTER — Ambulatory Visit (INDEPENDENT_AMBULATORY_CARE_PROVIDER_SITE_OTHER): Payer: 59 | Admitting: Internal Medicine

## 2018-04-09 ENCOUNTER — Other Ambulatory Visit: Payer: Self-pay

## 2018-04-09 DIAGNOSIS — R6889 Other general symptoms and signs: Secondary | ICD-10-CM | POA: Diagnosis not present

## 2018-04-09 DIAGNOSIS — R0600 Dyspnea, unspecified: Secondary | ICD-10-CM

## 2018-04-09 DIAGNOSIS — F411 Generalized anxiety disorder: Secondary | ICD-10-CM | POA: Diagnosis not present

## 2018-04-09 NOTE — Progress Notes (Signed)
Virtual Visit via Video Note  I connected with@ on 04/09/18 at  1:15 PM EDT by a video enabled telemedicine application and verified that I am speaking with the correct person using two identifiers. Location patient: home Location provider:work or home office Persons participating in the virtual visit: patient, provider  [place] national recommendations  regarding COVID 19 pandemic   video visit is advised over in office visit for this patient.  Discussed the limitations of evaluation and management by telemedicine and  availability of in person appointments. The patient expressed understanding and agreed to proceed.   HPI: [name]  Concern about  resp sx   See triage note/Onset with fatigue malaise but no temp since  April 3   Hone helper had similar illness and sick with cough for over 2 days getting better   .  Having som sob when she talks a lot   But fatigue cough not severe  clear mucous a bit  No hemoptysis   Has great anxiety cause of court case delayed and if has to go to hospital  care taking of of grand child.    No vomiting idarrhea  takign in fluids ok  Denies wheezing    Did nto take any of her  Alprazolam today  Says " I cant go tot ed " bad experience and responsibilities     04/09/18 12:03 PM  Note    Patient called and says she's been having SOB, chills, cough, no fever all since Thursday or Friday. She says she thinks she's been exposed to her support person who was instructed to self isolate for a cough and as soon as she was feeling better, they were around each other. She says this young lady helps with her grandson who lives there and they all have close contact with each other every day. She says the person was not tested for COVID-19, but was assumed she had it. As I was talking to the patient, I could hear her stopping and catching her breath in between sentences. I advised the patient to go to the ED due to her SOB. She says she's not SOB, but  her breath runs out when she's talking and would like to know if she can just get tested. I advised no testing is being done in the offices at this time and the patient's with breathing difficulties are being advised to go to the ED. She says she doesn't want to go there and be at risk of being exposed to all those germs and she has to think about her grandson and her health. She says she would like to get an inhaler. I asked her to hold while I call the office, I called and spoke to [place], [facility] who transferred me to [place], [place]. [place] asked to speak to the patient, I connected them both successfully.        ROS: See pertinent positives and negatives per HPI.  Past Medical History:  Diagnosis Date  . Brachial neuritis or radiculitis NOS   . Chronic hepatitis C (HCC) 10/05/2015  . Depressive disorder, not elsewhere classified   . Essential and other specified forms of tremor   . Fracture of 5th metatarsal 08/13/2017  . Fracture of multiple pubic rami (HCC) 08/13/2017  . Headache(784.0)   . Hepatitis C infection 12/05/2017   Treated and now resolved and "cured " as of early 2018  And     . Memory loss   . Microscopic hematuria  [name] in past  . Migraine, unspecified, without mention of intractable migraine without mention of status migrainosus   . Multiple rib fractures 08/13/2017  . Other specified disorders of rotator cuff syndrome of shoulder and allied disorders   . Vulvar pain    rx with valium supp    Past Surgical History:  Procedure Laterality Date  . BREAST EXCISIONAL BIOPSY Right    No scar  . CESAREAN SECTION     x3  . SHOULDER ARTHROSCOPY W/ ROTATOR CUFF REPAIR Right 2004  . SHOULDER ARTHROSCOPY W/ ROTATOR CUFF REPAIR Left 2008    Family History  Problem Relation Age of Onset  . Headache Mother   . Stroke Father        father passed away june17  . Diabetes Maternal Grandmother   . Other Son        Mother is deceased 2013 father moved to the area  2014.    SOCIAL HX:    Current Outpatient Medications:  .  albuterol (PROVENTIL HFA;VENTOLIN HFA) 108 (90 Base) MCG/ACT inhaler, Inhale 2 puffs into the lungs every 6 (six) hours as needed for wheezing or shortness of breath., Disp: 1 Inhaler, Rfl: 0 .  ALPRAZolam (XANAX) 1 MG tablet, Take 1 tablet (1 mg total) by mouth 4 (four) times daily as needed for anxiety (shakes)., Disp: 5 tablet, Rfl: 0 .  Alum Hydroxide-Mag Trisilicate (GAVISCON) 80-14.2 MG CHEW, Chew by mouth. CHEW 2 TABLETS BY MOUTH BEFORE EACH MEAL AT BEDTIME AS NEEDED FOR INDIGESTION OR HEARTBURN, Disp: , Rfl:  .  cetirizine (ZYRTEC) 10 MG tablet, Take 10 mg by mouth daily as needed for allergies., Disp: , Rfl:  .  desvenlafaxine (PRISTIQ) 50 MG 24 hr tablet, Take 50 mg by mouth daily., Disp: , Rfl:  .  dextroamphetamine (DEXEDRINE SPANSULE) 15 MG 24 hr capsule, Take 30-45 mg by mouth daily. 2 po q am and 1 po q noon, Disp: , Rfl:  .  estradiol (ESTRACE) 0.1 MG/GM vaginal cream, Place 1 Applicatorful vaginally daily as needed., Disp: 42.5 g, Rfl: 1 .  FLUoxetine (PROZAC) 20 MG capsule, Take 1 capsule by mouth daily., Disp: , Rfl: 0 .  gabapentin (NEURONTIN) 600 MG tablet, Take 600 mg by mouth. PO TID FOR HEEL PAIN, Disp: , Rfl:  .  HYDROcodone-ibuprofen (VICOPROFEN) 7.5-200 MG tablet, , Disp: , Rfl:  .  ibuprofen (ADVIL,MOTRIN) 200 MG tablet, Take 200 mg by mouth every 6 (six) hours as needed. TAKE 2 TABLETS (400MG ) BY MOUTH EVERY 6 HOURS FOR PAIN, Disp: , Rfl:  .  ketoconazole (NIZORAL) 2 % cream, APPLY TO AFFECTED AREA TWICE A DAY, Disp: 30 g, Rfl: 0 .  levothyroxine (SYNTHROID, LEVOTHROID) 25 MCG tablet, TAKE 1 TABLET (25 MCG TOTAL) BY MOUTH DAILY BEFORE BREAKFAST. TAKE ON AN EMPTY STOMACH, Disp: 30 tablet, Rfl: 1 .  Misc Natural Products (ESTROVEN + ENERGY MAX STRENGTH) TABS, Take by mouth daily., Disp: , Rfl:  .  Multiple Vitamins-Minerals (CENTRUM SILVER ADULT 50+) TABS, Take 1 tablet by mouth daily., Disp: , Rfl:  .   polyethylene glycol (MIRALAX / GLYCOLAX) packet, Take 17 g by mouth daily. (Patient not taking: Reported on 03/05/2018), Disp: 14 each, Rfl: 0 .  SUMAtriptan (IMITREX) 25 MG tablet, Take 1 tablet (25 mg total) by mouth as needed for migraine. May repeat in 2 hours if headache persists or recurs., Disp: 10 tablet, Rfl: 11 .  SUMAtriptan 6 MG/0.5ML SOAJ, Use 1 syringe as needed for migraines upon  awakening. Do not use with imitrex tablet together., Disp: 5 Syringe, Rfl: 11  EXAM:  VITALS per patient if applicable:  bp 117/80 pulse 93   resp seems unlabored during rest  Nl color   GENERAL: alert, oriented, appears well and in no acute distress  Tired appearing but no resp distress until talking a good bit  But noted that when she went to get her BP machined her speech and no dyspnea noted    bp was 117/80 second read and pulse 93    HEENT: atraumatic, conjunttiva clear, no obvious abnormalities on inspection of external nose and ears  NECK: normal movements of the head and neck  LUNGS: on inspection no signs of respiratory distress, breathing rate appears normal, no obvious gross SOB, gasping or wheezing see above  After talking a lot somedeep breathing but no flaring  Or grunting etc   CV: no obvious cyanosis  MS: moves all visible extremities without noticeable abnormality  PSYCH/NEURO: pleasant and cooperative, nanxiety  But , speech and thought processing normal for her  ASSESSMENT AND PLAN:  Discussed the following assessment and plan:  Dyspnea, unspecified type - virtual visit reassuring  may have some resp cause  but also anxiety close observation indicated pt "cant go to the ED" counseld for alarm sx and followup call  Flu-like symptoms - covid19  epidemic in community presumptions discussed   Generalized anxiety disorder and depressive reaction   Appears safet to watch nd wait at home with hydration support and to ge to ed with alarm sx getting worse  Can take the alpraz if needed  for secondary anxiety .   Expectant management and discussion of plan and treatment with patient with opportunity to ask questions and all were answered. The patient agreed with the plan and demonstrated an understanding of the instructions.   The patient was advised to call back or seek an in-person evaluation if worsening having concerns    or if the condition fails to improve as anticipated. Total visit > 50% spent counseling and coordinating care as indicated in above note and in instructions to patient .   Berniece Andreas, MD

## 2018-04-09 NOTE — Telephone Encounter (Signed)
Patient called and says she's been having SOB, chills, cough, no fever all since Thursday or Friday. She says she thinks she's been exposed to her support person who was instructed to self isolate for a cough and as soon as she was feeling better, they were around each other. She says this young lady helps with her grandson who lives there and they all have close contact with each other every day. She says the person was not tested for COVID-19, but was assumed she had it. As I was talking to the patient, I could hear her stopping and catching her breath in between sentences. I advised the patient to go to the ED due to her SOB. She says she's not SOB, but her breath runs out when she's talking and would like to know if she can just get tested. I advised no testing is being done in the offices at this time and the patient's with breathing difficulties are being advised to go to the ED. She says she doesn't want to go there and be at risk of being exposed to all those germs and she has to think about her grandson and her health. She says she would like to get an inhaler. I asked her to hold while I call the office, I called and spoke to [place], [facility] who transferred me to [place], [place]. [place] asked to speak to the patient, I connected them both successfully.    Reason for Disposition . [1] Difficulty breathing occurs AND [2] within 14 days of COVID-19 EXPOSURE (Close Contact)  Answer Assessment - Initial Assessment Questions 1. CLOSE CONTACT: "Who is the person with the confirmed or suspected COVID-19 infection that you were exposed to?"     Suspected from only support network 2. PLACE of CONTACT: "Where were you when you were exposed to COVID-19?" (e.g., home, school, medical waiting room; which city?)      Last week contact, the person comes back and forth with grandson 3. TYPE of CONTACT: "How much contact was there?" (e.g., sitting next to, live in same house, work in same office, same building)     Much  contact, hugging, riding in the car together 4. DURATION of CONTACT: "How long were you in contact with the COVID-19 patient?" (e.g., a few seconds, passed by person, a few minutes, live with the patient)     Riding in the car, coming to my house to get my grandson, he spends the night with her. 5. DATE of CONTACT: "When did you have contact with a COVID-19 patient?" (e.g., how many days ago)     30 minutes ago she was here.  6. TRAVEL: "Have you traveled out of the country recently?" If so, "When and where?"     * Also ask about out-of-state travel, since the CDC has identified some high risk cities for community spread in the [place].     * Note: Travel becomes less relevant if there is widespread community transmission where the patient lives.     No 7. COMMUNITY SPREAD: "Are there lots of cases or COVID-19 (community spread) where you live?" (See public health department website, if unsure)   * MAJOR community spread: high number of cases; numbers of cases are increasing; many people hospitalized.   * MINOR community spread: low number of cases; not increasing; few or no people hospitalized    No 8. SYMPTOMS: "Do you have any symptoms?" (e.g., fever, cough, breathing difficulty)     SOB, fatigue, chills, no fever,  cough, migraine h/a yesterday 9. PREGNANCY OR POSTPARTUM: "Is there any chance you are pregnant?" "When was your last menstrual period?" "Did you deliver in the last 2 weeks?"     No 10. HIGH RISK: "Do you have any heart or lung problems? Do you have a weak immune system?" (e.g., CHF, COPD, asthma, HIV positive, chemotherapy, renal failure, diabetes mellitus, sickle cell anemia)     No  Protocols used: CORONAVIRUS (COVID-19) EXPOSURE-A-AH

## 2018-04-11 NOTE — Progress Notes (Addendum)
Spoke with Pt states she is less fatigue and getting better but still not 100%. Pt states she is still "running out of steam"   Thanks for checking on her     Progress as expected    Please check on her  Middle next week . . thanks

## 2018-06-05 ENCOUNTER — Other Ambulatory Visit: Payer: Self-pay | Admitting: Internal Medicine

## 2018-07-30 ENCOUNTER — Ambulatory Visit: Payer: 59 | Admitting: Neurology

## 2018-07-30 ENCOUNTER — Encounter

## 2018-08-04 ENCOUNTER — Other Ambulatory Visit: Payer: Self-pay | Admitting: Internal Medicine

## 2018-09-05 ENCOUNTER — Other Ambulatory Visit: Payer: 59

## 2018-09-12 ENCOUNTER — Ambulatory Visit: Payer: 59 | Admitting: Internal Medicine

## 2018-09-20 ENCOUNTER — Other Ambulatory Visit: Payer: 59

## 2018-09-20 ENCOUNTER — Other Ambulatory Visit: Payer: Self-pay

## 2018-09-20 ENCOUNTER — Other Ambulatory Visit (INDEPENDENT_AMBULATORY_CARE_PROVIDER_SITE_OTHER): Payer: Medicare Other

## 2018-09-20 DIAGNOSIS — Z79899 Other long term (current) drug therapy: Secondary | ICD-10-CM

## 2018-09-20 DIAGNOSIS — E038 Other specified hypothyroidism: Secondary | ICD-10-CM

## 2018-09-20 DIAGNOSIS — E039 Hypothyroidism, unspecified: Secondary | ICD-10-CM

## 2018-09-20 LAB — TSH: TSH: 1.91 u[IU]/mL (ref 0.35–4.50)

## 2018-09-24 ENCOUNTER — Encounter: Payer: Self-pay | Admitting: Internal Medicine

## 2018-09-24 ENCOUNTER — Ambulatory Visit (INDEPENDENT_AMBULATORY_CARE_PROVIDER_SITE_OTHER): Payer: Medicare Other | Admitting: Internal Medicine

## 2018-09-24 ENCOUNTER — Other Ambulatory Visit: Payer: Self-pay

## 2018-09-24 DIAGNOSIS — Z79899 Other long term (current) drug therapy: Secondary | ICD-10-CM | POA: Diagnosis not present

## 2018-09-24 DIAGNOSIS — F439 Reaction to severe stress, unspecified: Secondary | ICD-10-CM

## 2018-09-24 DIAGNOSIS — F411 Generalized anxiety disorder: Secondary | ICD-10-CM

## 2018-09-24 DIAGNOSIS — E785 Hyperlipidemia, unspecified: Secondary | ICD-10-CM

## 2018-09-24 DIAGNOSIS — R413 Other amnesia: Secondary | ICD-10-CM

## 2018-09-24 DIAGNOSIS — E039 Hypothyroidism, unspecified: Secondary | ICD-10-CM

## 2018-09-24 DIAGNOSIS — E038 Other specified hypothyroidism: Secondary | ICD-10-CM

## 2018-09-24 DIAGNOSIS — R030 Elevated blood-pressure reading, without diagnosis of hypertension: Secondary | ICD-10-CM

## 2018-09-24 MED ORDER — LEVOTHYROXINE SODIUM 25 MCG PO TABS: 25.0000 ug | ORAL_TABLET | Freq: Every day | ORAL | 2 refills | Status: DC

## 2018-09-24 MED ORDER — KETOCONAZOLE 2 % EX CREA: TOPICAL_CREAM | CUTANEOUS | 2 refills | Status: DC

## 2018-09-24 NOTE — Patient Instructions (Signed)
   Thyroid is in range  And continue

## 2018-09-24 NOTE — Progress Notes (Signed)
Chief Complaint  Patient presents with  . Follow-up  . Medication Management  . Hypothyroidism    HPI: Toni [name] 63 y.o. come in for Chronic disease management  Forgot  In person visit but  [place] with doxy. Video visit    Pt aware of televisits . And visits and limitations of such   Thyroid :  Talking daily did get lab done  Having a hard time with still caretaking  Her 63 yo grand child adhd    Not a lot o relief and some in family disappoints .     Never turned in handicapped  Temp form but still could use some  lmimtations  Since her pelvic fracture had walker at home  No falling now but sometimes hrd to walk far.   Bp up some at times  Under stress  145/94 and one reading 160 03/28/94     Now a silver sneakers member and may go .   Would lneed refill nizoral for  Flaring intertrigo  Seems to help    ROS: See pertinent positives and negatives per HPI.  Past Medical History:  Diagnosis Date  . Brachial neuritis or radiculitis NOS   . Chronic hepatitis C ([place]) 10/05/2015  . Depressive disorder, not elsewhere classified   . Essential and other specified forms of tremor   . Fracture of 5th metatarsal 08/13/2017  . Fracture of multiple pubic rami ([place]) 08/13/2017  . Headache(784.0)   . Hepatitis C infection 12/05/2017   Treated and now resolved and "cured " as of early 03-28-2016  And     . Memory loss   . Microscopic hematuria    seed dr Gaynelle Arabian in past  . Migraine, unspecified, without mention of intractable migraine without mention of status migrainosus   . Multiple rib fractures 08/13/2017  . Other specified disorders of rotator cuff syndrome of shoulder and allied disorders   . Vulvar pain    rx with valium supp    Family History  Problem Relation Age of Onset  . Headache Mother   . Stroke Father        father passed away July 20, 2022  . Diabetes Maternal Grandmother   . Other Son        Mother is deceased March 29, 2011 father moved to the area 03-28-2012.    Social  History   Socioeconomic History  . Marital status: Divorced    Spouse name: Not on file  . Number of children: 3  . Years of education: Not on file  . Highest education level: Not on file  Occupational History  . Not on file  Social Needs  . Financial resource strain: Not on file  . Food insecurity    Worry: Not on file    Inability: Not on file  . Transportation needs    Medical: Not on file    Non-medical: Not on file  Tobacco Use  . Smoking status: Former Smoker    Types: Cigarettes    Quit date: 05/23/1978    Years since quitting: 40.3  . Smokeless tobacco: Never Used  Substance and Sexual Activity  . Alcohol use: No    Alcohol/week: 0.0 standard drinks  . Drug use: No  . Sexual activity: Not on file  Lifestyle  . Physical activity    Days per week: Not on file    Minutes per session: Not on file  . Stress: Not on file  Relationships  . Social Herbalist on  phone: Not on file    Gets together: Not on file    Attends religious service: Not on file    Active member of club or organization: Not on file    Attends meetings of clubs or organizations: Not on file    Relationship status: Not on file  Other Topics Concern  . Not on file  Social History Narrative   Does not sleep well at night   Lives alone   Has two cats   On Manufacturing engineer previous worked for [employer] On disability for depression and short-term memory problems. Dr. love.   She is gravida 3 para 3   Father passed away  07-15-2022    Outpatient Medications Prior to Visit  Medication Sig Dispense Refill  . albuterol (PROVENTIL HFA;VENTOLIN HFA) 108 (90 Base) MCG/ACT inhaler Inhale 2 puffs into the lungs every 6 (six) hours as needed for wheezing or shortness of breath. 1 Inhaler 0  . ALPRAZolam (XANAX) 1 MG tablet Take 1 tablet (1 mg total) by mouth 4 (four) times daily as needed for anxiety (shakes). 5 tablet 0  . Alum Hydroxide-Mag Trisilicate (GAVISCON) 10-25.8 MG  CHEW Chew by mouth. CHEW 2 TABLETS BY MOUTH BEFORE EACH MEAL AT BEDTIME AS NEEDED FOR INDIGESTION OR HEARTBURN    . cetirizine (ZYRTEC) 10 MG tablet Take 10 mg by mouth daily as needed for allergies.    Marland Kitchen desvenlafaxine (PRISTIQ) 50 MG 24 hr tablet Take 50 mg by mouth daily.    Marland Kitchen dextroamphetamine (DEXEDRINE SPANSULE) 15 MG 24 hr capsule Take 30-45 mg by mouth daily. 2 po q am and 1 po q noon    . estradiol (ESTRACE) 0.1 MG/GM vaginal cream Place 1 Applicatorful vaginally daily as needed. 42.5 g 1  . FLUoxetine (PROZAC) 20 MG capsule Take 1 capsule by mouth daily.  0  . gabapentin (NEURONTIN) 600 MG tablet Take 600 mg by mouth. PO TID FOR HEEL PAIN    . HYDROcodone-ibuprofen (VICOPROFEN) 7.5-200 MG tablet     . ibuprofen (ADVIL,MOTRIN) 200 MG tablet Take 200 mg by mouth every 6 (six) hours as needed. TAKE 2 TABLETS (400MG) BY MOUTH EVERY 6 HOURS FOR PAIN    . Misc Natural Products (ESTROVEN + ENERGY MAX STRENGTH) TABS Take by mouth daily.    . Multiple Vitamins-Minerals (CENTRUM SILVER ADULT 50+) TABS Take 1 tablet by mouth daily.    . polyethylene glycol (MIRALAX / GLYCOLAX) packet Take 17 g by mouth daily. (Patient not taking: Reported on 03/05/2018) 14 each 0  . SUMAtriptan (IMITREX) 25 MG tablet Take 1 tablet (25 mg total) by mouth as needed for migraine. May repeat in 2 hours if headache persists or recurs. 10 tablet 11  . SUMAtriptan 6 MG/0.5ML SOAJ Use 1 syringe as needed for migraines upon awakening. Do not use with imitrex tablet together. 5 Syringe 11  . ketoconazole (NIZORAL) 2 % cream APPLY TO AFFECTED AREA TWICE A DAY 30 g 0  . levothyroxine (SYNTHROID) 25 MCG tablet TAKE 1 TABLET (25 MCG TOTAL) BY MOUTH DAILY BEFORE BREAKFAST. TAKE ON AN EMPTY STOMACH 30 tablet 1   No facility-administered medications prior to visit.      EXAM:  There were no vitals taken for this visit.  There is no height or weight on file to calculate BMI.  GENERAL: vitals reviewed and listed above, alert,  oriented, appears well hydrated and in no acute distress emotional at times  Talking about her living situation  and ca retaking of her grandchild  HEENT: atraumatic, conjunctiva  clear, no obvious abnormalities on inspection of external nose and ears  NECK: no obvious masses on inspection palpation  LUNGS: clear to auscultation bilaterally, no wheezes, rales or rhonchi, good air movement CV: HRRR, no clubbing cyanosis or  peripheral edema nl cap refill   PSYCH: pleasant and cooperative,  Unsettled at times but cognition  Intact  Lab Results  Component Value Date   WBC 5.2 02/28/2018   HGB 13.6 02/28/2018   HCT 41.1 02/28/2018   PLT 221.0 02/28/2018   GLUCOSE 92 02/28/2018   CHOL 204 (H) 02/28/2018   TRIG 64.0 02/28/2018   HDL 52.40 02/28/2018   LDLCALC 139 (H) 02/28/2018   ALT 15 02/28/2018   AST 19 02/28/2018   NA 141 02/28/2018   K 4.9 02/28/2018   CL 103 02/28/2018   CREATININE 0.85 02/28/2018   BUN 26 (H) 02/28/2018   CO2 31 02/28/2018   TSH 1.91 09/20/2018   HGBA1C 5.8 02/28/2017   BP Readings from Last 3 Encounters:  03/05/18 136/82  01/12/18 100/62  01/12/18 100/62   Wt Readings from Last 3 Encounters:  03/05/18 197 lb (89.4 kg)  01/12/18 197 lb (89.4 kg)  01/12/18 197 lb (89.4 kg)    ASSESSMENT AND PLAN:  Discussed the following assessment and plan:  Subclinical hypothyroidism  Medication management  Generalized anxiety disorder and depressive reaction   Elevated blood pressure reading  Stress Elevated bp to monitor and if stays up get back with [place]   Otherwise cpx and labs in  Early March  Or thereabouts .  -Patient advised to return or notify health care team  if  new concerns arise. Total visit 14mns > 50% spent counseling and coordinating care as indicated in above note and in instructions to patient .  Plan on refills   And meds and plan  still sees counselor and  contating ok to do a renewal of handicapped  Placard  Never got the last one sent in  but could use this  Patient Instructions    Thyroid is in range  And continue     WGuthrieK. Kasem Mozer M.D.

## 2018-12-14 ENCOUNTER — Telehealth: Payer: Medicare Other | Admitting: Adult Health

## 2019-01-08 ENCOUNTER — Ambulatory Visit: Payer: Self-pay | Admitting: *Deleted

## 2019-01-08 NOTE — Telephone Encounter (Signed)
Pt has been made virtual appt 

## 2019-01-08 NOTE — Telephone Encounter (Signed)
Pt called stating that her 153/96 pulse 105 01/07/19 at 1200; it was 144/85 pulse 86 at 1758 after taking xanax and neurontin; she is having intermittent chest pain/tightness for about 1 week; she stopped her dextroamphetamine 2 days ago; this med "turns her brain on"; she is under a great deal of stress because her son is an addict, and she recently took custody oh her 66 year old grand son; recommendations made per nurse triage protocol; she verbalized understanding, but would like to see her PCP; she sees Dr Fabian Sharp, [facility]; pt transferred to [facility] for final disposition.   Reason for Disposition . [1] Systolic BP  >= 160 OR Diastolic >= 100 AND [2] cardiac or neurologic symptoms (e.g., chest pain, difficulty breathing, unsteady gait, blurred vision)  Answer Assessment - Initial Assessment Questions 1. BLOOD PRESSURE: "What is the blood pressure?" "Did you take at least two measurements 5 minutes apart?"     153/96 pulse 105 and 144/85 pulse 86 2. ONSET: "When did you take your blood pressure?"    01/07/19 3. HOW: "How did you obtain the blood pressure?" (e.g., visiting nurse, automatic home BP monitor)     Home cuff 4. HISTORY: "Do you have a history of high blood pressure?"      5. MEDICATIONS: "Are you taking any medications for blood pressure?" "Have you missed any doses recently?"     no 6. OTHER SYMPTOMS: "Do you have any symptoms?" (e.g., headache, chest pain, blurred vision, difficulty breathing, weakness)    Intermittent chest pain/tightness 7. PREGNANCY: "Is there any chance you are pregnant?" "When was your last menstrual period?"  Protocols used: HIGH BLOOD PRESSURE-A-AH

## 2019-01-10 ENCOUNTER — Telehealth (INDEPENDENT_AMBULATORY_CARE_PROVIDER_SITE_OTHER): Payer: Medicare Other | Admitting: Internal Medicine

## 2019-01-10 ENCOUNTER — Other Ambulatory Visit: Payer: Self-pay

## 2019-01-10 ENCOUNTER — Encounter: Payer: Self-pay | Admitting: Internal Medicine

## 2019-01-10 DIAGNOSIS — R0789 Other chest pain: Secondary | ICD-10-CM

## 2019-01-10 DIAGNOSIS — R030 Elevated blood-pressure reading, without diagnosis of hypertension: Secondary | ICD-10-CM | POA: Diagnosis not present

## 2019-01-10 DIAGNOSIS — Z79899 Other long term (current) drug therapy: Secondary | ICD-10-CM | POA: Diagnosis not present

## 2019-01-10 DIAGNOSIS — F439 Reaction to severe stress, unspecified: Secondary | ICD-10-CM

## 2019-01-10 DIAGNOSIS — F411 Generalized anxiety disorder: Secondary | ICD-10-CM

## 2019-01-10 MED ORDER — AMLODIPINE BESYLATE 2.5 MG PO TABS: 2.5000 mg | ORAL_TABLET | Freq: Every day | ORAL | 3 refills | Status: DC

## 2019-01-10 NOTE — Progress Notes (Signed)
Virtual Visit via Video Note  I connected with@ on 01/10/19 at  9:00 AM EST by a video enabled telemedicine application and verified that I am speaking with the correct person using two identifiers. Location patient: home Location provider: home office Persons participating in the virtual visit: patient, provider  [place] national recommendations  regarding COVID 19 pandemic   video visit is advised over in office visit for this patient.  Patient aware  of the limitations of evaluation and management by telemedicine and  availability of in person appointments. and agreed to proceed.   HPI: Toni [name] presents for video visit   See messages  For problem with new onset chest tightness after some  Stress full situations  Aggravated. she originally felt must be due to stress because of situation at home. But this was new and was concerened    Declined ed visit    She was finally able to get [name] EKG and vital signs done yesterday. EKG>   was read as normal rate 109 at onepoint  Pulse ox 95  And BP was 148/83 this helped relieve some of her anxiety.  It appears she did not have associated symptoms such as new shortness of breath cough fever syncope she does have radiation of right arm numbness but that is not new. She stopped her amphetamine for a few days especially her morning dose just in case.  Her blood pressures are tending to run in the 140s occasionally 150 at baseline.  And go up with stress.  She does not have a history of established active underlying cardio  disease at this time but has risk Has a follow-up with neurology soon.  ROS: See pertinent positives and negatives per HPI. Gets neck pain when bends over . Taking care of grand son  Her son is [name] addict  unreliable Past Medical History:  Diagnosis Date  . Brachial neuritis or radiculitis NOS   . Chronic hepatitis C ([place]) 10/05/2015  . Depressive disorder, not elsewhere classified   . Essential and other specified  forms of tremor   . Fracture of 5th metatarsal 08/13/2017  . Fracture of multiple pubic rami ([place]) 08/13/2017  . Headache(784.0)   . Hepatitis C infection 12/05/2017   Treated and now resolved and "cured " as of early 05/05/2016  And     . Memory loss   . Microscopic hematuria    seed dr Gaynelle Arabian in past  . Migraine, unspecified, without mention of intractable migraine without mention of status migrainosus   . Multiple rib fractures 08/13/2017  . Other specified disorders of rotator cuff syndrome of shoulder and allied disorders   . Vulvar pain    rx with valium supp    Past Surgical History:  Procedure Laterality Date  . BREAST EXCISIONAL BIOPSY Right    No scar  . CESAREAN SECTION     x3  . SHOULDER ARTHROSCOPY W/ ROTATOR CUFF REPAIR Right May 06, 2002  . SHOULDER ARTHROSCOPY W/ ROTATOR CUFF REPAIR Left May 06, 2006    Family History  Problem Relation Age of Onset  . Headache Mother   . Stroke Father        father passed away Jul 09, 2022  . Diabetes Maternal Grandmother   . Other Son        Mother is deceased 2011/05/06 father moved to the area 05/05/2012.        Current Outpatient Medications:  .  albuterol (PROVENTIL HFA;VENTOLIN HFA) 108 (90 Base) MCG/ACT inhaler, Inhale 2 puffs into the  lungs every 6 (six) hours as needed for wheezing or shortness of breath., Disp: 1 Inhaler, Rfl: 0 .  ALPRAZolam (XANAX) 1 MG tablet, Take 1 tablet (1 mg total) by mouth 4 (four) times daily as needed for anxiety (shakes)., Disp: 5 tablet, Rfl: 0 .  Alum Hydroxide-Mag Trisilicate (GAVISCON) 80-14.2 MG CHEW, Chew by mouth. CHEW 2 TABLETS BY MOUTH BEFORE EACH MEAL AT BEDTIME AS NEEDED FOR INDIGESTION OR HEARTBURN, Disp: , Rfl:  .  amLODipine (NORVASC) 2.5 MG tablet, Take 1 tablet (2.5 mg total) by mouth daily., Disp: 30 tablet, Rfl: 3 .  cetirizine (ZYRTEC) 10 MG tablet, Take 10 mg by mouth daily as needed for allergies., Disp: , Rfl:  .  desvenlafaxine (PRISTIQ) 50 MG 24 hr tablet, Take 50 mg by mouth daily., Disp: , Rfl:   .  dextroamphetamine (DEXEDRINE SPANSULE) 15 MG 24 hr capsule, Take 30-45 mg by mouth daily. 2 po q am and 1 po q noon, Disp: , Rfl:  .  estradiol (ESTRACE) 0.1 MG/GM vaginal cream, Place 1 Applicatorful vaginally daily as needed., Disp: 42.5 g, Rfl: 1 .  FLUoxetine (PROZAC) 20 MG capsule, Take 1 capsule by mouth daily., Disp: , Rfl: 0 .  gabapentin (NEURONTIN) 600 MG tablet, Take 600 mg by mouth. PO TID FOR HEEL PAIN, Disp: , Rfl:  .  HYDROcodone-ibuprofen (VICOPROFEN) 7.5-200 MG tablet, , Disp: , Rfl:  .  ibuprofen (ADVIL,MOTRIN) 200 MG tablet, Take 200 mg by mouth every 6 (six) hours as needed. TAKE 2 TABLETS (400MG ) BY MOUTH EVERY 6 HOURS FOR PAIN, Disp: , Rfl:  .  ketoconazole (NIZORAL) 2 % cream, APPLY TO AFFECTED AREA TWICE A DAY, Disp: 30 g, Rfl: 2 .  levothyroxine (SYNTHROID) 25 MCG tablet, Take 1 tablet (25 mcg total) by mouth daily before breakfast. Take on [name] empty stomach, Disp: 90 tablet, Rfl: 2 .  Misc Natural Products (ESTROVEN + ENERGY MAX STRENGTH) TABS, Take by mouth daily., Disp: , Rfl:  .  Multiple Vitamins-Minerals (CENTRUM SILVER ADULT 50+) TABS, Take 1 tablet by mouth daily., Disp: , Rfl:  .  polyethylene glycol (MIRALAX / GLYCOLAX) packet, Take 17 g by mouth daily. (Patient not taking: Reported on 03/05/2018), Disp: 14 each, Rfl: 0 .  SUMAtriptan (IMITREX) 25 MG tablet, Take 1 tablet (25 mg total) by mouth as needed for migraine. May repeat in 2 hours if headache persists or recurs., Disp: 10 tablet, Rfl: 11 .  SUMAtriptan 6 MG/0.5ML SOAJ, Use 1 syringe as needed for migraines upon awakening. Do not use with imitrex tablet together., Disp: 5 Syringe, Rfl: 11  EXAM: BP Readings from Last 3 Encounters:  03/05/18 136/82  01/12/18 100/62  01/12/18 100/62    VITALS per patient if applicable:  See  Above hx reported info  GENERAL: alert, oriented, appears well and in no acute distress  HEENT: atraumatic, conjunttiva clear, no obvious abnormalities on inspection of external  nose and ears  NECK: normal movements of the head and neck  LUNGS: on inspection no signs of respiratory distress, breathing rate appears normal, no obvious gross SOB, gasping or wheezing  CV: no obvious cyanosis   PSYCH/NEURO: alert nd cooperative,  Anxious  But nl baseline for her  And  Speech nl   For her  Lab Results  Component Value Date   WBC 5.2 02/28/2018   HGB 13.6 02/28/2018   HCT 41.1 02/28/2018   PLT 221.0 02/28/2018   GLUCOSE 92 02/28/2018   CHOL 204 (H) 02/28/2018  TRIG 64.0 02/28/2018   HDL 52.40 02/28/2018   LDLCALC 139 (H) 02/28/2018   ALT 15 02/28/2018   AST 19 02/28/2018   NA 141 02/28/2018   K 4.9 02/28/2018   CL 103 02/28/2018   CREATININE 0.85 02/28/2018   BUN 26 (H) 02/28/2018   CO2 31 02/28/2018   TSH 1.91 09/20/2018   HGBA1C 5.8 02/28/2017    ASSESSMENT AND PLAN:  Discussed the following assessment and plan:    ICD-10-CM   1. Chest tightness  R07.89 Ambulatory referral to Cardiology  2. Elevated blood pressure reading  R03.0 Ambulatory referral to Cardiology  3. Medication management  Z79.899 Ambulatory referral to Cardiology  4. Stress  F43.9   5. Generalized anxiety disorder and depressive reaction   F41.1    So she has many reasons to have chest tightness from anxiety however this is a new situation and her baseline blood pressure has been on the high side. Discussed adding medication for hypertension at this time we will add 2.5 mg of amlodipine once a day. Also agreed with limiting her amphetamines as possible she uses this to "wake up her brain".  She is followed by neurology. Discussed and she is aware of alarm symptoms to seek immediate care. I think we would be best served if we get a cardiology consult as to opinion  As to the etiology of the  chest tightness since she is on a number of meds and  The stresses will not go away any time soon  Counseled.   Expectant management and discussion of plan and treatment with opportunity to  ask questions and all were answered. The patient agreed with the plan and demonstrated [name] understanding of the instructions.  labs in system  Advised to call back or seek [name] in-person evaluation if worsening  or having  further concerns . In interim  Return for after cardiology eval and   fu meds .  bp etc    Outside external source  DATA REVIEWED:   ekg  Info  :  Total time on date  of service including record review ordering and plan of care:  45 minutes  To include  Counseling  Medication  Referral   Berniece Andreas, MD

## 2019-01-14 ENCOUNTER — Encounter: Payer: Self-pay | Admitting: Neurology

## 2019-01-14 ENCOUNTER — Telehealth (INDEPENDENT_AMBULATORY_CARE_PROVIDER_SITE_OTHER): Payer: Medicare Other | Admitting: Neurology

## 2019-01-14 ENCOUNTER — Other Ambulatory Visit: Payer: Self-pay

## 2019-01-14 VITALS — Ht 62.0 in | Wt 197.0 lb

## 2019-01-14 DIAGNOSIS — R42 Dizziness and giddiness: Secondary | ICD-10-CM | POA: Diagnosis not present

## 2019-01-14 DIAGNOSIS — IMO0002 Reserved for concepts with insufficient information to code with codable children: Secondary | ICD-10-CM

## 2019-01-14 DIAGNOSIS — G43709 Chronic migraine without aura, not intractable, without status migrainosus: Secondary | ICD-10-CM

## 2019-01-14 NOTE — Progress Notes (Signed)
Virtual Visit via Video Note The purpose of this virtual visit is to provide medical care while limiting exposure to the novel coronavirus.    Consent was obtained for video visit:  Yes.   Answered questions that patient had about telehealth interaction:  Yes.   I discussed the limitations, risks, security and privacy concerns of performing an evaluation and management service by telemedicine. I also discussed with the patient that there may be a patient responsible charge related to this service. The patient expressed understanding and agreed to proceed.  Pt location: Home Physician Location: office Name of referring provider:  Panosh, Neta Mends, MD I connected with Toni [name] at patients initiation/request on 01/14/2019 at  4:00 PM EST by video enabled telemedicine application and verified that I am speaking with the correct person using two identifiers. Pt MRN:  [id number] Pt DOB:  [birth year] Video Participants:  Toni [name]   History of Present Illness:  The patient was seen as a virtual video visit on 01/14/2019. She was last seen a year ago for migraines. She is again very verbose, needing redirection. She continues to have significant stress now that she has permanent custody of her grandson Toni [name]. She states she has no support. She reports headaches are not as much as before, but sometimes can occur daily for a week a couple times a year, depending on barometric pressure. Sumatriptan injections have helped. She is on gabapentin 300mg  TID for neuropathy. She is hyperfocused on her skull, stating that she had scans in 2011 and 2018, then after a fall in 2018, "there were changes in my skull." Her MRI brain in 2018 showed moderate generalized atrophy accounting for prominence of CSF spaces, no acute changes. She states it could be the stress. She gets so dizzy, she states her medications are so conflicting, something she takes for her brain is not good for her  body. Her balance is off, she signed up for [employer] to help. She has noticed she drops things easily. She states "there is nothing wrong with my memory or intelligence." She was able to keep up with all the documents required for custody. She continues to see Psychiatry and psychotherapy, no recent medication changes made.    HPI: This is a 64 yo LH woman with a history of migraines, treatment-resistant depression, anxiety, hepatitis C, who had initially presented with a presumed diagnosis of frontotemporal dementia. She had previously been seeing neurologist Toni [name], then she saw Toni [name] at [facility] one time, followed by 2 visits with Toni [name] at [name]. I will summarize her history for our records. She reports she was high-performance, doing multiple things at the same time, until 2011 when she started having more difficulties at work. She had been unable to do things she used to do. She started having problems sleeping. She denied any problems with her memory. She had lack of stamina, word-finding difficulties, and had been going through stress with her son at that time. She had 2 brain MRI studies done (05/08/09 and 02/24/12) which I personally reviewed, showing mild atrophy in the bilateral frontal lobes. She had a PET scan in 2011 reported as normal. She underwent Neuropsychological evaluation with Toni [name] in 2011 which did not identify areas of cognitive impairment. Her current level of emotional distress seems far higher than her reported longtime baseline of what sounds like mild depression and generalized anxiety. Recommendation was for continued psychological counseling and psychiatric services.  On review of Toni [name]  notes, her MOCA score was normal 28/30. He stated that she does not appear to the the disinhibition, apathy, or language disorder on exam which would be typical of frontotemporal dementia. He did a PET scan which showed decreased metabolic activity in regions of  atrophy in the anterior frontal lobes, which confounds comparison of metabolism patterns with normal age-matched individuals. No pattern of hypometabolic changes to suggest a specific type of dementia on this exam. On her last visit with him in 10/[name], his assessment was Presumed frontotemporal dementia, serial imaging demonstrated frontal atrophy bilaterally. PET was inconclusive due to pre-existing frontal atrophy. MOCA score stable at 28/30. He suggested repeating Neuropsychological testing. They discussed that SSRIs may be helpful for behavioral abnormalities. She had been taking Fetzima at that time, and was switched to Pristiq because she was reporting different side effects. She has been seeing Dr. Evelene [name] for many years and reports trying different medications. Dr. Evelene [name] had noted that she has cognitive impairment caused by frontotemporal dementia and depression. Depression has been treatment-resistant. She has extreme profound inability to sustain attention, process information, modulate information. Ms. Toni [name] reports that for the past few months, she had difficulty getting out of bed. She was tearful in the office today, stating she is "totally afraid to make commitments" and says "I have no purpose in life." She went from no desire to do anything, to extreme anxiety of having to do something. She asks about getting home therapy to help get her out of bed and go down to eat, otherwise she would stay in bed. Her fear is that she is getting worse and her children will see that this is how she is. She just wants to "give myself the best chance of getting my life back." She feels Pristiq does not seem to be working. She tried stimulants in the past but could not take them. She reports erratic sleep, she would be sleep-deprived and get her days and nights confused, other times she would sleep for 2 straight days. She was started on a new medication one time and became so confused with hallucinations, she went to  the wrong dentist office.   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 Results show that despite evidence she has mild bifrontal lobe atrophy on imaging, it was felt that she does NOT have frontotemporal dementia based on 2 evaluations that do not fit the typical pattern of frontotemporal dementia despite exhibiting consistent difficulty with complex planning/organizing. Hypotheses include that without serial imaging before 2011,"it is not possible if these changes coincided with her significant cognitive and behavioral changes or are long-standing. However, based on the probability of having "frontal symptoms" due to this finding, it is likely that the structural brain changes are related, to some extent, to her having significant change in functioning. After review of history, there are a few possibilities. She was diagnosed with hepatitis C for many decades and has not undergone treatment, is it possible that she has minimal hepatic encephalopathy? This could also be related to the patient's complaint of extreme fatigue and treatment resistant depression. Based on a very high level of personal stress in 2011, did the patient have a "nervous breakdown" and her decompensation has been related solely to psychiatric stress specifically depression? There is literature to suggest atrophy in frontal brain areas secondary to depression. The diagnosis of FTD is unlikely." Recommendations included  ongoing psychiatric evaluation/treatment, CBT, treatment for hepatitis C.  She has a history of migraines occurring 5 days a week, with pain behind her eyes or radiating from her neck, with uncomfortable pressure behind her eyes. Relpax helps if she takes it at the onset. If she wakes up with a migraine, she uses sumatriptan  injections. She has chronic neck and back pain.     Current Outpatient Medications on File Prior to Visit  Medication Sig Dispense Refill  . albuterol (PROVENTIL HFA;VENTOLIN HFA) 108 (90 Base) MCG/ACT inhaler Inhale 2 puffs into the lungs every 6 (six) hours as needed for wheezing or shortness of breath. 1 Inhaler 0  . ALPRAZolam (XANAX) 1 MG tablet Take 1 tablet (1 mg total) by mouth 4 (four) times daily as needed for anxiety (shakes). 5 tablet 0  . Alum Hydroxide-Mag Trisilicate (GAVISCON) 80-14.2 MG CHEW Chew by mouth. CHEW 2 TABLETS BY MOUTH BEFORE EACH MEAL AT BEDTIME AS NEEDED FOR INDIGESTION OR HEARTBURN    . amLODipine (NORVASC) 2.5 MG tablet Take 1 tablet (2.5 mg total) by mouth daily. 30 tablet 3  . cetirizine (ZYRTEC) 10 MG tablet Take 10 mg by mouth daily as needed for allergies.    Marland Kitchen desvenlafaxine (PRISTIQ) 50 MG 24 hr tablet Take 50 mg by mouth daily.    Marland Kitchen dextroamphetamine (DEXEDRINE SPANSULE) 15 MG 24 hr capsule Take 30-45 mg by mouth daily. 2 po q am and 1 po q noon    . FLUoxetine (PROZAC) 20 MG capsule Take 1 capsule by mouth daily.  0  . gabapentin (NEURONTIN) 600 MG tablet Take 600 mg by mouth. PO TID FOR HEEL PAIN    . HYDROcodone-ibuprofen (VICOPROFEN) 7.5-200 MG tablet     . ibuprofen (ADVIL,MOTRIN) 200 MG tablet Take 200 mg by mouth every 6 (six) hours as needed. TAKE 2 TABLETS (400MG ) BY MOUTH EVERY 6 HOURS FOR PAIN    . ketoconazole (NIZORAL) 2 % cream APPLY TO AFFECTED AREA TWICE A DAY 30 g 2  . levothyroxine (SYNTHROID) 25 MCG tablet Take 1 tablet (25 mcg total) by mouth daily before breakfast. Take on an empty stomach 90 tablet 2  . Misc Natural Products (ESTROVEN + ENERGY MAX STRENGTH) TABS Take by mouth daily.    . Multiple Vitamins-Minerals (CENTRUM SILVER ADULT 50+) TABS Take 1 tablet by mouth daily.    . SUMAtriptan (IMITREX) 25 MG tablet Take 1 tablet (25 mg total) by mouth as needed for migraine. May repeat in 2 hours if headache persists or recurs. 10  tablet 11   No current facility-administered medications on file prior to visit.     Observations/Objective:   Vitals:   01/14/19 0904  Weight: 197 lb (89.4 kg)  Height: 5\' 2"  (1.575 m)   GEN:  The patient appears stated age and is in NAD. Anxious, tearful, verbose needing repeated redirection.  Neurological examination: Patient is awake, alert, oriented x 3. No aphasia or dysarthria. Intact fluency and comprehension. Remote and recent memory intact. Able to name and repeat. Cranial nerves: Extraocular movements intact with no nystagmus. No facial asymmetry. Motor: moves all extremities symmetrically, at least anti-gravity x 4.    Assessment and Plan:   This is a 64 yo LH woman with a history of migraines, treatment-resistant depression, hepatitis C, who had initially presented for a diagnosis of presumed frontotemporal dementia. She has bilateral frontal atrophy on MRI, however PET scans have been inconclusive due to pre-existing atrophy. She underwent repeat Neuropsychological evaluation which did not  indicate any evidence for frontotemporal dementia. She is reporting dizziness and continues to be hyperfocused on her skull. She was reassured about prior scans that did not show any skull defects. MRI brain without contrast will be ordered for dizziness, rule out underlying structural abnormality. She has occasional migraines with good response to sumatriptan. She takes the tablets for rescue, using the injections for migraines upon awakening. She is again verbose and difficult to redirect, more anxious and tearful, follow-up with Behavioral Health strongly recommended. Follow-up in 1 year, she knows to call for any changes.    Follow Up Instructions:   -I discussed the assessment and treatment plan with the patient. The patient was provided an opportunity to ask questions and all were answered. The patient agreed with the plan and demonstrated an understanding of the instructions.   The  patient was advised to call back or seek an in-person evaluation if the symptoms worsen or if the condition fails to improve as anticipated.    Cameron Sprang, MD

## 2019-01-21 ENCOUNTER — Other Ambulatory Visit: Payer: Self-pay | Admitting: Neurology

## 2019-01-21 ENCOUNTER — Telehealth: Payer: Self-pay | Admitting: Neurology

## 2019-01-21 MED ORDER — SUMATRIPTAN SUCCINATE 25 MG PO TABS: 25.0000 mg | ORAL_TABLET | ORAL | 11 refills | Status: DC | PRN

## 2019-01-21 NOTE — Telephone Encounter (Signed)
Pt believes that Dr. Karel Jarvis told her at her last visit that she would in fact order an MRI. She says that she has spaces in her skull, her skull hurts.

## 2019-01-21 NOTE — Telephone Encounter (Signed)
Patient left msg with after hours about waiting on her MRI to be set up. She hasn't heard anything from anyone in regards to scheduling. Thanks!

## 2019-01-21 NOTE — Telephone Encounter (Signed)
Called and it rang and rang, unable to LVM sending mychart message

## 2019-01-21 NOTE — Telephone Encounter (Signed)
We had discussed doing an MRI brain without contrast, ok to order, thanks

## 2019-01-21 NOTE — Telephone Encounter (Signed)
Patient called in regarding her Tremors are becoming Worse. She would like to speak with someone regarding having an MRI and that her "Skull hurts really bad". Please Call. Thank you

## 2019-01-22 ENCOUNTER — Other Ambulatory Visit: Payer: Self-pay

## 2019-01-22 ENCOUNTER — Telehealth: Payer: Self-pay | Admitting: Neurology

## 2019-01-22 DIAGNOSIS — G43709 Chronic migraine without aura, not intractable, without status migrainosus: Secondary | ICD-10-CM

## 2019-01-22 DIAGNOSIS — IMO0002 Reserved for concepts with insufficient information to code with codable children: Secondary | ICD-10-CM

## 2019-01-22 NOTE — Telephone Encounter (Signed)
This has been addressed in prior telephone encounters. MRI has been scheduled per Karel Jarvis. Pt is aware.

## 2019-01-22 NOTE — Telephone Encounter (Signed)
MRI ordered. Pt made aware via mychart

## 2019-01-22 NOTE — Telephone Encounter (Signed)
Patient was wanting you to look at the CT Scan she had in October from Dr. Fabian Sharp and then give her call and see if she still needs to get the MRI. Thanks!

## 2019-01-22 NOTE — Telephone Encounter (Signed)
Dr. Karel Jarvis,  Do you still wish for pt to have MRI?

## 2019-01-22 NOTE — Telephone Encounter (Signed)
Is she talking about the CT scan in October 2018? Because after that one, I ordered an MRI brain in November 2018.

## 2019-01-23 ENCOUNTER — Telehealth: Payer: Self-pay | Admitting: Neurology

## 2019-01-23 NOTE — Telephone Encounter (Signed)
Pt wants to make note that she feels she does not have migraines. She states that she has skull pain, occipital neuropathy, tremors, balance problems, white matter seen on imaging, and abnormal spacing seen on imaging as well.  Pt also states that her Hep C has been cured since 2018 and that her depression and anxiety are under control.  Pt requests that MRI be with contrast.  Per Dr. Karel Jarvis MRI brain changed to w/wo contrast.

## 2019-01-23 NOTE — Telephone Encounter (Signed)
Patient called needing to speak with the nurse regarding mis diagnosis Information? She said she feels that she may be have been having mimi strokes and over looking diagnosis? She said this call is not related to her migraines.  Please Call. Thank you

## 2019-01-25 ENCOUNTER — Other Ambulatory Visit: Payer: Medicare Other

## 2019-01-28 ENCOUNTER — Ambulatory Visit: Payer: Medicare Other | Admitting: Internal Medicine

## 2019-01-31 ENCOUNTER — Other Ambulatory Visit: Payer: Self-pay

## 2019-01-31 ENCOUNTER — Encounter: Payer: Self-pay | Admitting: Internal Medicine

## 2019-01-31 ENCOUNTER — Ambulatory Visit: Payer: Medicare Other | Admitting: Internal Medicine

## 2019-01-31 VITALS — BP 122/75 | HR 89 | Temp 97.5°F | Ht 64.0 in | Wt 195.4 lb

## 2019-01-31 DIAGNOSIS — R072 Precordial pain: Secondary | ICD-10-CM

## 2019-01-31 DIAGNOSIS — F411 Generalized anxiety disorder: Secondary | ICD-10-CM

## 2019-01-31 DIAGNOSIS — R519 Headache, unspecified: Secondary | ICD-10-CM

## 2019-01-31 DIAGNOSIS — G894 Chronic pain syndrome: Secondary | ICD-10-CM | POA: Diagnosis not present

## 2019-01-31 NOTE — Progress Notes (Signed)
Cardiology Office Note:    Date:  01/31/2019   ID:  Toni [name], DOB 1955/12/28, MRN [id number]  PCP:  Madelin Headings, MD  Cardiologist:  No primary care provider on file.  Electrophysiologist:  None   Referring MD: Madelin Headings, MD   Chief Complaint: chest pain  History of Present Illness:    Toni [name] is a 64 y.o. female with a hx of Hepatitis C, Depression, Migraines, Cervicalgia who presents today for evaluation of atypical chest pain.  She tells me she leads a very stressful life and has custody of her 75-year-old grandson.  She tells me her life is a "stress bomb" she follows with psychiatry and feels her depression is well controlled but continues to struggle with anxiety.  Of note she has been on disability since 2011.  She takes a stimulant daily to "wake up her brain".  She is experiencing a rapid heart rate and feels her blood pressure comes up.  She feels short of breath while speaking.  She describes her chest pain is substernal chest stabbing it does not improve with change in position and can last approximately 45 minutes.  Generally goes away on its own since she cannot find a comfortable position.  She takes Xanax with some improvement in chest pain.  She has dyspnea at rest and with exertion.  Denies PND orthopnea, denies leg swelling.  Denies syncope or presyncope.  No significant dizziness or lightheadedness.  Family history of cardiovascular disease with her father having an MI at age 82 and passing from a stroke 2 weeks after that.  Father MI 13, stroke 2 weeks later.   Past Medical History:  Diagnosis Date  . Brachial neuritis or radiculitis NOS   . Chronic hepatitis C (HCC) 10/05/2015  . Depressive disorder, not elsewhere classified   . Essential and other specified forms of tremor   . Fracture of 5th metatarsal 08/13/2017  . Fracture of multiple pubic rami (HCC) 08/13/2017  . Headache(784.0)   . Hepatitis C infection 12/05/2017    Treated and now resolved and "cured " as of early 2018  And     . Memory loss   . Microscopic hematuria    seed dr Patsi Sears in past  . Migraine, unspecified, without mention of intractable migraine without mention of status migrainosus   . Multiple rib fractures 08/13/2017  . Other specified disorders of rotator cuff syndrome of shoulder and allied disorders   . Vulvar pain    rx with valium supp    Past Surgical History:  Procedure Laterality Date  . BREAST EXCISIONAL BIOPSY Right    No scar  . CESAREAN SECTION     x3  . SHOULDER ARTHROSCOPY W/ ROTATOR CUFF REPAIR Right 2004  . SHOULDER ARTHROSCOPY W/ ROTATOR CUFF REPAIR Left 2008    Current Medications: Current Meds  Medication Sig  . albuterol (PROVENTIL HFA;VENTOLIN HFA) 108 (90 Base) MCG/ACT inhaler Inhale 2 puffs into the lungs every 6 (six) hours as needed for wheezing or shortness of breath.  . ALPRAZolam (XANAX) 1 MG tablet Take 1 tablet (1 mg total) by mouth 4 (four) times daily as needed for anxiety (shakes).  . Alum Hydroxide-Mag Trisilicate (GAVISCON) 80-14.2 MG CHEW Chew by mouth. CHEW 2 TABLETS BY MOUTH BEFORE EACH MEAL AT BEDTIME AS NEEDED FOR INDIGESTION OR HEARTBURN  . amLODipine (NORVASC) 2.5 MG tablet Take 1 tablet (2.5 mg total) by mouth daily.  . cetirizine (ZYRTEC) 10 MG tablet Take 10 mg  by mouth daily as needed for allergies.  Marland Kitchen desvenlafaxine (PRISTIQ) 50 MG 24 hr tablet Take 50 mg by mouth daily.  Marland Kitchen dextroamphetamine (DEXEDRINE SPANSULE) 15 MG 24 hr capsule Take 30-45 mg by mouth daily. 2 po q am and 1 po q noon  . FLUoxetine (PROZAC) 20 MG capsule Take 1 capsule by mouth daily.  Marland Kitchen gabapentin (NEURONTIN) 600 MG tablet Take 600 mg by mouth. PO TID FOR HEEL PAIN  . HYDROcodone-ibuprofen (VICOPROFEN) 7.5-200 MG tablet   . ketoconazole (NIZORAL) 2 % cream APPLY TO AFFECTED AREA TWICE A DAY  . levothyroxine (SYNTHROID) 25 MCG tablet Take 1 tablet (25 mcg total) by mouth daily before breakfast. Take on an  empty stomach  . Misc Natural Products (ESTROVEN + ENERGY MAX STRENGTH) TABS Take by mouth daily.  . Multiple Vitamins-Minerals (CENTRUM SILVER ADULT 50+) TABS Take 1 tablet by mouth daily.  . SUMAtriptan (IMITREX) 25 MG tablet Take 1 tablet (25 mg total) by mouth as needed for migraine. May repeat in 2 hours if headache persists or recurs.  . SUMAtriptan 6 MG/0.5ML SOAJ Use 1 syringe as needed for migraines upon awakening. Do not use with imitrex tablet together.     Allergies:   Codeine   Social History   Socioeconomic History  . Marital status: Divorced    Spouse name: Not on file  . Number of children: 3  . Years of education: Not on file  . Highest education level: Not on file  Occupational History  . Not on file  Tobacco Use  . Smoking status: Former Smoker    Types: Cigarettes    Quit date: 05/23/1978    Years since quitting: 40.7  . Smokeless tobacco: Never Used  Substance and Sexual Activity  . Alcohol use: No    Alcohol/week: 0.0 standard drinks  . Drug use: No  . Sexual activity: Not on file  Other Topics Concern  . 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 On disability for depression and short-term memory problems. Dr. love.   She is gravida 3 para 3   Father passed away  06/29/2022  Social Determinants of Health   Financial Resource Strain:   . Difficulty of Paying Living Expenses: Not on file  Food Insecurity:   . Worried About Programme researcher, broadcasting/film/video in the Last Year: Not on file  . Ran Out of Food in the Last Year: Not on file  Transportation Needs:   . Lack of Transportation (Medical): Not on file  . Lack of Transportation (Non-Medical): Not on file  Physical Activity:   . Days of Exercise per Week: Not on file  . Minutes of Exercise per Session: Not on file  Stress:   . Feeling of Stress : Not on file  Social Connections:    . Frequency of Communication with Friends and Family: Not on file  . Frequency of Social Gatherings with Friends and Family: Not on file  . Attends Religious Services: Not on file  . Active Member of Clubs or Organizations: Not on file  . Attends Banker Meetings: Not on file  . Marital Status: Not on file     Family History: The patient's family history includes Diabetes in her maternal grandmother; Headache in her mother; Other in her son; Stroke in her father.  ROS:   Please see the history of present illness.    All other systems reviewed and are negative.  EKGs/Labs/Other Studies Reviewed:    The following studies were reviewed today:  EKG:  NSR, RAE  Recent Labs: 02/28/2018: ALT 15; BUN 26; Creatinine, Ser 0.85; Hemoglobin 13.6; Platelets 221.0; Potassium 4.9; Sodium 141 09/20/2018: TSH 1.91  Recent Lipid Panel    Component Value Date/Time   CHOL 204 (H) 02/28/2018 0814   TRIG 64.0 02/28/2018 0814   HDL 52.40 02/28/2018 0814   CHOLHDL 4 02/28/2018 0814   VLDL 12.8 02/28/2018 0814   LDLCALC 139 (H) 02/28/2018 0814    Physical Exam:    VS:  BP 122/75   Pulse 89   Temp (!) 97.5 F (36.4 C)   Ht 5\' 4"  (1.626 m)   Wt 195 lb 6.4 oz (88.6 kg)   SpO2 99%   BMI 33.54 kg/m     Wt Readings from Last 5 Encounters:  01/31/19 195 lb 6.4 oz (88.6 kg)  01/14/19 197 lb (89.4 kg)  03/05/18 197 lb (89.4 kg)  01/12/18 197 lb (89.4 kg)  01/12/18 197 lb (89.4 kg)     Constitutional: Anxious, tearful Eyes: sclera non-icteric, normal conjunctiva and lids ENMT: normal dentition, moist mucous membranes Cardiovascular: regular rhythm, normal rate, no murmurs. S1 and S2 normal. Radial pulses normal bilaterally. No jugular venous distention.  Respiratory: clear to auscultation bilaterally, speaks comfortably in full sentences. GI : normal bowel sounds, soft and nontender. No distention.   MSK: extremities warm, well perfused. No edema.  NEURO: grossly nonfocal  exam, moves all extremities. PSYCH: alert and oriented x 3, anxious, tearful ASSESSMENT:    1. Precordial pain   2. Chronic pain syndrome   3. Persistent headaches   4. Generalized anxiety disorder    PLAN:    Chest pain-she describes a substernal chest stabbing sensation with some typical and atypical features.  She is very concerned about her heart and wants to ensure that these episodes of anxiety with chest discomfort are not a sign of ischemia.  She has risk factors of hyperlipidemia and obesity.  With that in mind we should rule out ischemia with a Lexiscan Myoview.  Ideally we would put her on a treadmill to stress her which she says she can do, however due to COVID-19 restrictions we are unable to perform this test in conjunction with the D SPECT camera which will be optimal for her imaging given body habitus.  Dyspnea-for her dyspnea we should perform an echocardiogram.  Her chest pain is sharp and stabbing as well, it would be optimal to evaluate for any pericardial issues we see on echo.  In addition we will evaluate diastolic function to continue to assess her dyspnea.  Hyperlipidemia-not currently on lipid-lowering therapy, this will need to be discussed pending results of stress testing.  She will likely need high intensity statin therapy.  Hypertension-on amlodipine 2.5 mg daily, blood pressure well controlled today.  Mood disorder-continues to see psychiatry for depression and anxiety, encourage routine follow-up given evidence of significant anxiety on exam today.   Total time of encounter: 60 minutes total time of encounter, including 40 minutes spent in face-to-face patient care. This time includes coordination of care and counseling regarding Chest pain. Remainder of non-face-to-face time involved reviewing chart documents/testing relevant to the patient encounter and documentation in the medical record.  Cherlynn Kaiser, MD [facility]   Medication  Adjustments/Labs and Tests Ordered: Current medicines  are reviewed at length with the patient today.  Concerns regarding medicines are outlined above.  Orders Placed This Encounter  Procedures  . Myocardial Perfusion Imaging  . EKG 12-Lead  . ECHOCARDIOGRAM COMPLETE   No orders of the defined types were placed in this encounter.   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  As part of our continuing mission to provide you with exceptional heart care, we have created designated Provider Care Teams.  These Care Teams include your primary Cardiologist (physician) and Advanced Practice Providers (APPs -  Physician Assistants and Nurse Practitioners) who all work together to provide you with the care you need, when you need it.  Your next appointment: 2 to 3 weeks   The format for your next appointment:  Office   Provider:  Joni Reining DNP

## 2019-01-31 NOTE — Patient Instructions (Signed)
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  As part of our continuing mission to provide you with exceptional heart care, we have created designated Provider Care Teams.  These Care Teams include your primary Cardiologist (physician) and Advanced Practice Providers (APPs -  Physician Assistants and Nurse Practitioners) who all work together to provide you with the care you need, when you need it.  Your next appointment: 2 to 3 weeks   The format for your next appointment:  Office   Provider:  Joni Reining DNP

## 2019-02-11 ENCOUNTER — Telehealth: Payer: Self-pay | Admitting: Neurology

## 2019-02-11 NOTE — Telephone Encounter (Signed)
Patient states that she needs to talk to someone about some mistakes in her DX and dates, her AVS  Is not correct. She also wants to know how she is going to get her MRI results. She also states that her DX disappeared and 2 years ago it was there   Her hep c is cured and you could look at the test and see that   she is having pain in skull  Her depression is under control and not the problem  MRI has changed from 2018  she fell on 07-06-16 and hit the back of her head   She is dropping things and her gait is off  " Skull can't get it to stop"   Please call her at [phone] if you cant get her on the cell phone number [phone]

## 2019-02-11 NOTE — Telephone Encounter (Signed)
Spoke with pt, stated that her skull is her issue and its killing her, she stated that her MRI is now scheduled at [facility] imaging, she stated that there AVS from Jan was not right that dates where not right and that DX and HX needed to be changed, pt stated that her summery she read was the worse summery she has read,   Stated that her Hep C was cured in 2018 Her Depression is not her problem. Her problem is her Skull hurting her. Stated her DX of occipital neuropathy isn't on her chart anymore. Pt stated that she was a change in medication, she knows medication, and that she is her own advocate. She stated that her FDT was both on the left slide not bilateral and would like that to be changed in her chart.   Pt asking who will follow up after her MRI? Pt advised we would call her after we get the results.    Pt informed that I would let Dr. Karel Jarvis know everything she had talked about.

## 2019-02-12 ENCOUNTER — Telehealth (HOSPITAL_COMMUNITY): Payer: Self-pay

## 2019-02-12 NOTE — Telephone Encounter (Signed)
Instructions left on the patient's answering machine. Asked to call back with any questions. S.Antoria Lanza EMTP 

## 2019-02-13 NOTE — Telephone Encounter (Signed)
Pls let patient know that we received her request about changing her record and it will go through the appropriate process that [facility] has set in place for review. Thank you

## 2019-02-13 NOTE — Telephone Encounter (Signed)
I called left voice mail for patient to call office and ask for me so that I can go over the process for addressing her concerns about her medical records.

## 2019-02-14 ENCOUNTER — Encounter: Payer: Self-pay | Admitting: Neurology

## 2019-02-14 ENCOUNTER — Ambulatory Visit (HOSPITAL_COMMUNITY): Payer: Medicare Other | Attending: Cardiology

## 2019-02-14 ENCOUNTER — Other Ambulatory Visit: Payer: Self-pay

## 2019-02-14 ENCOUNTER — Ambulatory Visit (HOSPITAL_BASED_OUTPATIENT_CLINIC_OR_DEPARTMENT_OTHER): Payer: Medicare Other

## 2019-02-14 VITALS — Ht 64.0 in | Wt 195.0 lb

## 2019-02-14 DIAGNOSIS — R072 Precordial pain: Secondary | ICD-10-CM

## 2019-02-14 LAB — MYOCARDIAL PERFUSION IMAGING
LV dias vol: 61 mL (ref 46–106)
LV sys vol: 14 mL
Peak HR: 96 {beats}/min
Rest HR: 72 {beats}/min
SDS: 4
SRS: 0
SSS: 4
TID: 1.15

## 2019-02-14 MED ORDER — TECHNETIUM TC 99M TETROFOSMIN IV KIT
32.7000 | PACK | Freq: Once | INTRAVENOUS | Status: AC | PRN
  Administered 2019-02-14: 32.7 via INTRAVENOUS
  Filled 2019-02-14: qty 33

## 2019-02-14 MED ORDER — TECHNETIUM TC 99M TETROFOSMIN IV KIT
11.0000 | PACK | Freq: Once | INTRAVENOUS | Status: AC | PRN
  Administered 2019-02-14: 11 via INTRAVENOUS
  Filled 2019-02-14: qty 11

## 2019-02-14 MED ORDER — REGADENOSON 0.4 MG/5ML IV SOLN
0.4000 mg | Freq: Once | INTRAVENOUS | Status: AC
  Administered 2019-02-14: 0.4 mg via INTRAVENOUS

## 2019-02-15 ENCOUNTER — Encounter: Payer: Self-pay | Admitting: Neurology

## 2019-02-15 NOTE — Telephone Encounter (Signed)
Called and spoke to patient to let her know that [facility] HIM department will mail her a Request to Amend her medical record form to complete and send back in. She verbalized understanding. I informed her that [facility] HIM department will handle her process for the amendment from this point forward. Patient verbalized understanding. Patient verbalized that she needs for Dr. Karel Jarvis to follow up with her regarding her test results from her most recent testing.

## 2019-02-15 NOTE — Telephone Encounter (Signed)
Thanks for the update   And   Blood pressure reading   Is very good  !. Will follow     FYI  Sometimes  Injections around the  Head and neck  Help  certain types of head pain but not sure  If that is for you. ( not my expertise)   Naval architect

## 2019-02-18 NOTE — Telephone Encounter (Signed)
Patient called today and asked to speak with me. She wanted to follow up from the request to amend her medical record. She explained in detail why she was being verbose about her skull pain and her medical history that has lead up her asking for an amendment. I allowed the patient to share her concerns. She stated multiple times that she wanted to Dr. Karel Jarvis to be her neurologists. Although she expressed concerns on why Dr. Karel Jarvis was "missing" or not documenting all of her history in her medical chart. I informed patient that Dr. Karel Jarvis has discharged her as a patient. She asked why and I informed her is was due to the loss of patient-provider relationship. Patient verbalized how much she hated to hear that but understood. I informed her that Dr. Karel Jarvis will follow-up with her on the MRI per the patients request. Patient verbalized understanding and thanked me for allowing her call and share her concerns.

## 2019-02-19 ENCOUNTER — Telehealth: Payer: Self-pay | Admitting: Neurology

## 2019-02-19 NOTE — Progress Notes (Signed)
Cardiology Office Note   Date:  02/20/2019   ID:  Toni [name], DOB 03/19/1955, MRN [id number]  PCP:  Burnis Medin, MD  Cardiologist:  Dr. Margaretann Loveless  CC: Follow Up   History of Present Illness: Toni [name] is a 64 y.o. female who presents for ongoing assessment and management of atypical chest pain, HTN, HLwith hx of Hepatitis C, Depression, Migraines, Cervicaligia. She admitted to being under a lot of stress, is followed by psychiatry for anxiety and depression.   She was last seen by Dr. Margaretann Loveless on 01/31/2019 and described her chest pain as substernal, stabbing, lasting up to 45 minutes.She takes a Xanax with some improvement in pain. She has a strong hx of father having an MI at age 48, and CVA two weeks later causing death.   An echocardiogram was ordered, and NM stress test for diagnostic/prognostic purposes.   Echo revealed normal LV function with Grade 1 diastolic dysfunction, EF of 60-65%. No significant valvular abnormalities. NM Stress test was was normal and did not indicate perfusion deficits. Found to be low risk.  She comes today with several questions concerning her test results that she was unable to read them clearly when she pulled them up on her phone.  I have reviewed each one of her tests and answered her questions.  I have given her reassurance that her heart function is doing well and she does not have any indication that she is having ischemic changes corresponding to her symptoms.  Past Medical History:  Diagnosis Date  . Brachial neuritis or radiculitis NOS   . Chronic hepatitis C ([place]) 10/05/2015  . Depressive disorder, not elsewhere classified   . Essential and other specified forms of tremor   . Fracture of 5th metatarsal 08/13/2017  . Fracture of multiple pubic rami ([place]) 08/13/2017  . Headache(784.0)   . Hepatitis C infection 12/05/2017   Treated and now resolved and "cured " as of early 2018  And     . Memory loss   .  Microscopic hematuria    seed dr Gaynelle Arabian in past  . Migraine, unspecified, without mention of intractable migraine without mention of status migrainosus   . Multiple rib fractures 08/13/2017  . Other specified disorders of rotator cuff syndrome of shoulder and allied disorders   . Vulvar pain    rx with valium supp    Past Surgical History:  Procedure Laterality Date  . BREAST EXCISIONAL BIOPSY Right    No scar  . CESAREAN SECTION     x3  . SHOULDER ARTHROSCOPY W/ ROTATOR CUFF REPAIR Right 2004  . SHOULDER ARTHROSCOPY W/ ROTATOR CUFF REPAIR Left 2008     Current Outpatient Medications  Medication Sig Dispense Refill  . albuterol (PROVENTIL HFA;VENTOLIN HFA) 108 (90 Base) MCG/ACT inhaler Inhale 2 puffs into the lungs every 6 (six) hours as needed for wheezing or shortness of breath. 1 Inhaler 0  . ALPRAZolam (XANAX) 1 MG tablet Take 1 tablet (1 mg total) by mouth 4 (four) times daily as needed for anxiety (shakes). 5 tablet 0  . Alum Hydroxide-Mag Trisilicate (GAVISCON) 07-37.1 MG CHEW Chew by mouth. CHEW 2 TABLETS BY MOUTH BEFORE EACH MEAL AT BEDTIME AS NEEDED FOR INDIGESTION OR HEARTBURN    . amLODipine (NORVASC) 2.5 MG tablet Take 1 tablet (2.5 mg total) by mouth daily. 30 tablet 3  . cetirizine (ZYRTEC) 10 MG tablet Take 10 mg by mouth daily as needed for allergies.    Marland Kitchen desvenlafaxine (PRISTIQ) 50  MG 24 hr tablet Take 50 mg by mouth daily.    Marland Kitchen dextroamphetamine (DEXEDRINE SPANSULE) 15 MG 24 hr capsule Take 30-45 mg by mouth daily. 2 po q am and 1 po q noon    . FLUoxetine (PROZAC) 20 MG capsule Take 1 capsule by mouth daily.  0  . gabapentin (NEURONTIN) 600 MG tablet Take 600 mg by mouth. PO TID FOR HEEL PAIN    . HYDROcodone-ibuprofen (VICOPROFEN) 7.5-200 MG tablet     . ketoconazole (NIZORAL) 2 % cream APPLY TO AFFECTED AREA TWICE A DAY 30 g 2  . levothyroxine (SYNTHROID) 25 MCG tablet Take 1 tablet (25 mcg total) by mouth daily before breakfast. Take on an empty stomach 90  tablet 2  . Misc Natural Products (ESTROVEN + ENERGY MAX STRENGTH) TABS Take by mouth daily.    . Multiple Vitamins-Minerals (CENTRUM SILVER ADULT 50+) TABS Take 1 tablet by mouth daily.    . SUMAtriptan (IMITREX) 25 MG tablet Take 1 tablet (25 mg total) by mouth as needed for migraine. May repeat in 2 hours if headache persists or recurs. 10 tablet 11  . SUMAtriptan 6 MG/0.5ML SOAJ Use 1 syringe as needed for migraines upon awakening. Do not use with imitrex tablet together. 0.5 mL 6   No current facility-administered medications for this visit.    Allergies:   Codeine    Social History:  The patient  reports that she quit smoking about 40 years ago. Her smoking use included cigarettes. She has never used smokeless tobacco. She reports that she does not drink alcohol or use drugs.   Family History:  The patient's family history includes Diabetes in her maternal grandmother; Headache in her mother; Other in her son; Stroke in her father.    ROS: All other systems are reviewed and negative. Unless otherwise mentioned in H&P    PHYSICAL EXAM: VS:  BP 114/80   Pulse 90   Ht 5\' 4"  (1.626 m)   Wt 195 lb 9.6 oz (88.7 kg)   SpO2 96%   BMI 33.57 kg/m  , BMI Body mass index is 33.57 kg/m. GEN: Well nourished, well developed, in no acute distress HEENT: normal Neck: no JVD, carotid bruits, or masses Cardiac: RRR; no murmurs, rubs, or gallops,no edema  Respiratory:  Clear to auscultation bilaterally, normal work of breathing GI: soft, nontender, nondistended, + BS MS: no deformity or atrophy Skin: warm and dry, no rash Neuro:  Strength and sensation are intact Psych: euthymic mood, full affect   EKG: Not completed this office visit.  Recent Labs: 02/28/2018: ALT 15; BUN 26; Creatinine, Ser 0.85; Hemoglobin 13.6; Platelets 221.0; Potassium 4.9; Sodium 141 09/20/2018: TSH 1.91    Lipid Panel    Component Value Date/Time   CHOL 204 (H) 02/28/2018 0814   TRIG 64.0 02/28/2018 0814    HDL 52.40 02/28/2018 0814   CHOLHDL 4 02/28/2018 0814   VLDL 12.8 02/28/2018 0814   LDLCALC 139 (H) 02/28/2018 0814      Wt Readings from Last 3 Encounters:  02/20/19 195 lb 9.6 oz (88.7 kg)  February 28, 2019 195 lb (88.5 kg)  01/31/19 195 lb 6.4 oz (88.6 kg)      Other studies Reviewed: NM Stress Test 02/28/2019    Nuclear stress EF: 77%. No wall motion abnormalities  There was no ST segment deviation noted during stress.  This is a low risk study.  The study is normal. No perfusion defects identified. No ischemia, no infarct.   Echocardiogram 02-28-19  1. Normal LV systolic function; grade 1 diastolic dysfunction; trace AI.  2. Left ventricular ejection fraction, by estimation, is 60 to 65%. The  left ventricle has normal function. The left ventrical has no regional  wall motion abnormalities. Left ventricular diastolic parameters are  consistent with Grade I diastolic  dysfunction (impaired relaxation).  3. Right ventricular systolic function is normal. The right ventricular  size is normal. There is normal pulmonary artery systolic pressure.  4. The mitral valve is normal in structure and function. trivial mitral  valve regurgitation. No evidence of mitral stenosis.  5. The aortic valve is tricuspid. Aortic valve regurgitation is trivial .  Mild aortic valve sclerosis is present, with no evidence of aortic valve  stenosis.  6. The inferior vena cava is normal in size with greater than 50%  respiratory variability, suggesting right atrial pressure of 3 mmHg.    ASSESSMENT AND PLAN:  1.  Noncardiac chest pain: Stress test and echocardiogram are reassuring.  No indication for more invasive testing at this time.  She will return to her primary care for ongoing evaluation and management.  We will see her as needed if symptoms return or worsen.    Current medicines are reviewed at length with the patient today.    Labs/ tests ordered today include: None  Bettey Mare. Liborio Nixon, ANP, AACC   02/20/2019 2:12 PM    [facility] [address] Office [phone] Fax [phone]  Notice: This dictation was prepared with Dragon dictation along with smaller phrase technology. Any transcriptional errors that result from this process are unintentional and may not be corrected upon review.

## 2019-02-19 NOTE — Telephone Encounter (Signed)
Patient dismissed from [facility] by Patrcia Dolly MD, effective 02/15/19. Dismissal Letter sent out by 1st class mail. [employer]

## 2019-02-20 ENCOUNTER — Other Ambulatory Visit: Payer: Self-pay

## 2019-02-20 ENCOUNTER — Encounter: Payer: Self-pay | Admitting: Adult Health

## 2019-02-20 ENCOUNTER — Ambulatory Visit (INDEPENDENT_AMBULATORY_CARE_PROVIDER_SITE_OTHER): Payer: Medicare Other | Admitting: Adult Health

## 2019-02-20 VITALS — BP 114/80 | HR 90 | Ht 64.0 in | Wt 195.6 lb

## 2019-02-20 DIAGNOSIS — R0789 Other chest pain: Secondary | ICD-10-CM | POA: Diagnosis not present

## 2019-02-20 DIAGNOSIS — R072 Precordial pain: Secondary | ICD-10-CM | POA: Diagnosis not present

## 2019-02-20 NOTE — Patient Instructions (Signed)
Medication Instructions:  Continue current medications  *If you need a refill on your cardiac medications before your next appointment, please call your pharmacy*  Lab Work: None Ordered  Testing/Procedures: None Ordered  Follow-Up: At [facility], you and your health needs are our priority.  As part of our continuing mission to provide you with exceptional heart care, we have created designated Provider Care Teams.  These Care Teams include your primary Cardiologist (physician) and Advanced Practice Providers (APPs -  Physician Assistants and Nurse Practitioners) who all work together to provide you with the care you need, when you need it.  Your next appointment:   As Needed   

## 2019-02-26 ENCOUNTER — Ambulatory Visit
Admission: RE | Admit: 2019-02-26 | Discharge: 2019-02-26 | Disposition: A | Discharge status: Home / Self Care | Payer: Medicare Other | Source: Ambulatory Visit | Attending: Neurology | Admitting: Neurology

## 2019-02-26 ENCOUNTER — Other Ambulatory Visit: Payer: Self-pay

## 2019-02-26 DIAGNOSIS — IMO0002 Reserved for concepts with insufficient information to code with codable children: Secondary | ICD-10-CM

## 2019-02-26 DIAGNOSIS — G43709 Chronic migraine without aura, not intractable, without status migrainosus: Secondary | ICD-10-CM

## 2019-02-26 MED ORDER — GADOBENATE DIMEGLUMINE 529 MG/ML IV SOLN
18.0000 mL | Freq: Once | INTRAVENOUS | Status: AC | PRN
  Administered 2019-02-26: 18 mL via INTRAVENOUS

## 2019-02-27 ENCOUNTER — Telehealth: Payer: Self-pay

## 2019-02-27 NOTE — Telephone Encounter (Signed)
Pt called and informed that MRI shows no change from her brain scan in 2018. There is no evidence of tumor, stroke, or bleed. It still shows the prominent spaces outside the brain and they have not changed. It does show that there are severe arthritis changes in her upper neck and suggests cervicogenic cause of headache, meaning headaches are due to the arthritis changes in her neck. It would be good to let her Pain specialist know.  Pt asked if we can fax copy of results to Dr Newell [name] so he can have it for her appointment tomorrow morning,

## 2019-03-04 ENCOUNTER — Telehealth: Payer: Self-pay | Admitting: Neurology

## 2019-03-04 NOTE — Telephone Encounter (Signed)
Hey there. We've received a new referral on patient for balance problems. She's seen Dr. [name] in the past (2014), but is requesting to switch her care to Dr. Anne Hahn. She has also seen Dr. Karel Jarvis, but has been dismissed from their practice. Would you both be ok with the switch?

## 2019-03-06 NOTE — Telephone Encounter (Signed)
Thank you Dr. Anne Hahn.

## 2019-04-15 ENCOUNTER — Other Ambulatory Visit: Payer: Self-pay | Admitting: Internal Medicine

## 2019-04-15 DIAGNOSIS — Z1231 Encounter for screening mammogram for malignant neoplasm of breast: Secondary | ICD-10-CM

## 2019-04-18 ENCOUNTER — Encounter: Payer: Self-pay | Admitting: Neurology

## 2019-04-18 ENCOUNTER — Ambulatory Visit: Payer: Medicare Other | Admitting: Neurology

## 2019-04-18 ENCOUNTER — Other Ambulatory Visit: Payer: Self-pay

## 2019-04-18 VITALS — BP 124/81 | HR 83 | Temp 97.3°F | Ht 63.0 in | Wt 195.0 lb

## 2019-04-18 DIAGNOSIS — Z5181 Encounter for therapeutic drug level monitoring: Secondary | ICD-10-CM

## 2019-04-18 DIAGNOSIS — R519 Headache, unspecified: Secondary | ICD-10-CM | POA: Diagnosis not present

## 2019-04-18 DIAGNOSIS — R251 Tremor, unspecified: Secondary | ICD-10-CM | POA: Diagnosis not present

## 2019-04-18 DIAGNOSIS — G4486 Cervicogenic headache: Secondary | ICD-10-CM

## 2019-04-18 DIAGNOSIS — E538 Deficiency of other specified B group vitamins: Secondary | ICD-10-CM

## 2019-04-18 DIAGNOSIS — R413 Other amnesia: Secondary | ICD-10-CM | POA: Diagnosis not present

## 2019-04-18 DIAGNOSIS — R202 Paresthesia of skin: Secondary | ICD-10-CM

## 2019-04-18 DIAGNOSIS — G43709 Chronic migraine without aura, not intractable, without status migrainosus: Secondary | ICD-10-CM | POA: Diagnosis not present

## 2019-04-18 DIAGNOSIS — IMO0002 Reserved for concepts with insufficient information to code with codable children: Secondary | ICD-10-CM

## 2019-04-18 NOTE — Progress Notes (Signed)
Reason for visit: Gait instability, paresthesias  Referring physician: Dr. Jenell Milliner Toni [name] is a 64 y.o. female  History of present illness:  Ms. Toni [name] is a 64 year old left-handed white female with a history of migraine headaches and some mild cognitive issues that have been evaluated in the past.  The patient has been seen initially through this office by Dr. Erling Cruz, subsequently she was seen by Dr. Rexene Alberts, then by Dr. Delice Lesch from [facility], then by Dr. Thana Ates at [facility].  The patient recently was discharged from the practice of Dr. Delice Lesch. The patient is sent here for a new issue.  The patient was evaluated years ago beginning in 2011 for possible frontotemporal dementia, she has had neuropsychological evaluations done in 2011 and again in 2016 that did not indicate a true dementia, a PET scan procedure done at [facility] was unremarkable, again not consistent with dementia.  The patient has a history of anxiety and depression, she is followed by Dr. Toy Care for this.  The patient believes that her depression is under good control currently.  The patient lives with a 13-year-old grandson, she is raising him, there are no other adults in the house.  The patient has chronic pain issues that involve the neck with cervicogenic headaches that are worse on the right.  The patient also has a history of migraine headaches since she was a teenager.  The migraine is oftentimes brought on by not eating, and may begin behind the eyes occasionally associated with nausea and vomiting.  The patient does report photophobia and phonophobia.  She denies any vision changes or cognitive changes.  The headaches may occur 3-4 times a month, for at least 2 weeks of the year she may have headaches that last 5 days or more.  She takes oral or injectable Imitrex for the headache.  She also has a different type of headache that comes up from the neck associated with tenderness at the base of the skull,  particularly on the right.  The patient does have some neck and shoulder tightness and discomfort.  She indicates that over the last 3 months that she has begun having intermittent paresthesias that involve the right side of the body including the arm, body, and leg that comes and goes, excluding the face.  Occasionally she may have some numbness of the left foot.  She indicates that the paresthesias usually begin in the morning and worsen as the day goes on.  She does not have any definite weakness with this.  At times, she may not have any paresthesias.  She denies issues controlling the bowels or the bladder.  The patient was sent to this office initially for evaluation of a gait disturbance, but the patient claims that this is not a concern that she has.  The patient is on alprazolam 1 mg 4 times daily, she takes gabapentin 600 mg 3 times daily, and she takes hydrocodone if needed for her chronic pain.  In the past, she has been seen by Dr. Nelva Bush and she has gotten epidural steroid injections, but she has not had any injections recently.  The patient reports a prior history of a left foot fracture and pelvic fractures, she had a fall in 2018.  Stress may also increase her neck pain and headache issues.  She believes that there may be a parallel between the neck and shoulder and headache problems and the paresthesias in the extremities.  She indicates that she is dropping things from her  right hand frequently.  She comes to this office for an evaluation.  The patient also reports some tremors that involve the right upper extremity only.  Past Medical History:  Diagnosis Date  . Brachial neuritis or radiculitis NOS   . Chronic hepatitis C (HCC) 10/05/2015  . Depressive disorder, not elsewhere classified   . Essential and other specified forms of tremor   . Fracture of 5th metatarsal 08/13/2017  . Fracture of multiple pubic rami (HCC) 08/13/2017  . Headache(784.0)   . Hepatitis C infection 12/05/2017    Treated and now resolved and "cured " as of early May 14, 2016  And     . Memory loss   . Microscopic hematuria    seed dr Patsi Sears in past  . Migraine, unspecified, without mention of intractable migraine without mention of status migrainosus   . Multiple rib fractures 08/13/2017  . Other specified disorders of rotator cuff syndrome of shoulder and allied disorders   . Vulvar pain    rx with valium supp    Past Surgical History:  Procedure Laterality Date  . BREAST EXCISIONAL BIOPSY Right    No scar  . CESAREAN SECTION     x3  . SHOULDER ARTHROSCOPY W/ ROTATOR CUFF REPAIR Right May 15, 2002  . SHOULDER ARTHROSCOPY W/ ROTATOR CUFF REPAIR Left 05/15/06    Family History  Problem Relation Age of Onset  . Headache Mother   . Stroke Father        father passed away june17  . Diabetes Maternal Grandmother   . Other Son        Mother is deceased 2011/05/15 father moved to the area May 14, 2012.    Social history:  reports that she quit smoking about 40 years ago. Her smoking use included cigarettes. She has never used smokeless tobacco. She reports that she does not drink alcohol or use drugs.  Medications:  Prior to Admission medications   Medication Sig Start Date End Date Taking? Authorizing Provider  albuterol (PROVENTIL HFA;VENTOLIN HFA) 108 (90 Base) MCG/ACT inhaler Inhale 2 puffs into the lungs every 6 (six) hours as needed for wheezing or shortness of breath. 07/04/16  Yes Gordy Savers, MD  ALPRAZolam Prudy Feeler) 1 MG tablet Take 1 tablet (1 mg total) by mouth 4 (four) times daily as needed for anxiety (shakes). 08/10/17  Yes Albertine Grates, MD  Alum Hydroxide-Mag Trisilicate (GAVISCON) 80-14.2 MG CHEW Chew by mouth. CHEW 2 TABLETS BY MOUTH BEFORE EACH MEAL AT BEDTIME AS NEEDED FOR INDIGESTION OR HEARTBURN   Yes [provider]  amLODipine (NORVASC) 2.5 MG tablet Take 1 tablet (2.5 mg total) by mouth daily. 01/10/19  Yes Panosh, Neta Mends, MD  cetirizine (ZYRTEC) 10 MG tablet Take 10 mg by mouth daily as  needed for allergies.   Yes [provider]  desvenlafaxine (PRISTIQ) 50 MG 24 hr tablet Take 50 mg by mouth daily.   Yes [provider]  dextroamphetamine (DEXEDRINE SPANSULE) 15 MG 24 hr capsule Take 30-45 mg by mouth daily. 2 po q am and 1 po q noon   Yes Milagros Evener, MD  FLUoxetine (PROZAC) 20 MG capsule Take 1 capsule by mouth daily. 10/09/14  Yes [provider]  gabapentin (NEURONTIN) 600 MG tablet Take 600 mg by mouth. PO TID FOR HEEL PAIN   Yes [provider]  HYDROcodone-ibuprofen (VICOPROFEN) 7.5-200 MG tablet every 6 (six) hours as needed.  03/03/18  Yes [provider]  ketoconazole (NIZORAL) 2 % cream APPLY TO AFFECTED AREA TWICE  A DAY Patient taking differently: as needed. APPLY TO AFFECTED AREA TWICE A DAY 09/24/18  Yes Panosh, Neta Mends, MD  levothyroxine (SYNTHROID) 25 MCG tablet Take 1 tablet (25 mcg total) by mouth daily before breakfast. Take on an empty stomach 09/24/18  Yes Panosh, Neta Mends, MD  Misc Natural Products (ESTROVEN + ENERGY MAX STRENGTH) TABS Take by mouth daily.   Yes [provider]  Multiple Vitamins-Minerals (CENTRUM SILVER ADULT 50+) TABS Take 1 tablet by mouth daily.   Yes [provider]  SUMAtriptan (IMITREX) 25 MG tablet Take 1 tablet (25 mg total) by mouth as needed for migraine. May repeat in 2 hours if headache persists or recurs. 01/21/19  Yes Van Clines, MD  SUMAtriptan 6 MG/0.5ML SOAJ Use 1 syringe as needed for migraines upon awakening. Do not use with imitrex tablet together. 01/21/19  Yes Van Clines, MD      Allergies  Allergen Reactions  . Codeine Nausea And Vomiting and Other (See Comments)    Shuts down GI tract    ROS:  Out of a complete 14 system review of symptoms, the patient complains only of the following symptoms, and all other reviewed systems are negative.  Paresthesias Neck pain, headache Migraine Depression, anxiety  Blood pressure 124/81, pulse 83,  temperature (!) 97.3 F (36.3 C), height 5\' 3"  (1.6 m), weight 195 lb (88.5 kg).  Physical Exam  General: The patient is alert and cooperative at the time of the examination.  Eyes: Pupils are equal, round, and reactive to light. Discs are flat bilaterally.  Good venous pulsations are seen.  Neck: The neck is supple, no carotid bruits are noted.  Respiratory: The respiratory examination is clear.  Cardiovascular: The cardiovascular examination reveals a regular rate and rhythm, no obvious murmurs or rubs are noted.   Neuromuscular: The patient does have some limitation of movement of the cervical spine with 15 to 20 degrees of restriction of movement with lateral rotation bilaterally.  Skin: Extremities are without significant edema.  Neurologic Exam  Mental status: The patient is alert and oriented x 3 at the time of the examination. The patient has apparent normal recent and remote memory, with an apparently normal attention span and concentration ability.  Cranial nerves: Facial symmetry is present. There is good sensation of the face to pinprick and soft touch bilaterally. The strength of the facial muscles and the muscles to head turning and shoulder shrug are normal bilaterally. Speech is well enunciated, no aphasia or dysarthria is noted. Extraocular movements are full. Visual fields are full. The tongue is midline, and the patient has symmetric elevation of the soft palate. No obvious hearing deficits are noted.  Motor: The motor testing reveals 5 over 5 strength of all 4 extremities. Good symmetric motor tone is noted throughout.  Sensory: Sensory testing is intact to pinprick, soft touch, vibration sensation, and position sense on all 4 extremities, with exception of some decreased vibration sensation on the right foot as compared to the left. No evidence of extinction is noted.  Coordination: Cerebellar testing reveals good finger-nose-finger and heel-to-shin bilaterally.  No  significant tremors were noted.  Gait and station: Gait is slightly wide-based. Tandem gait is minimally unsteady. Romberg is negative. No drift is seen.  Reflexes: Deep tendon reflexes are symmetric and normal bilaterally. Toes are downgoing bilaterally.   MRI brain 02/26/19:  IMPRESSION: 1. Severe facet osteoarthritis on the right at C1-2 with inflammatory features. There is also advanced facet osteoarthritis on  the left at C2-3. Consider cervicogenic cause of headache. 2. Stable intracranial imaging with no specific cause for headache.  * MRI scan images were reviewed online. I agree with the written report.    Assessment/Plan:  1.  Migraine headache  2.  Cervical spondylosis, cervicogenic headache  3.  Intermittent paresthesias, right side and left foot  4.  History of hepatitis C, treated  5.  Mild gait instability  6.  Prior evaluation for frontotemporal dementia, dementia excluded  7.  Anxiety and depression, followed through psychiatry  The patient reports some intermittent paresthesias, this could potentially be related to the cervical spondylosis.  The patient does have cervicogenic headache.  The patient be sent for physical therapy working on the neck and shoulders, she will undergo blood work today.  She will have nerve conduction studies on both legs and the right arm and EMG on the right arm.  She will follow-up otherwise in 6 months.  Marlan Palau MD 04/18/2019 9:08 AM  [facility] [address] [place], [place] [address]  Phone [phone] Fax [phone]

## 2019-04-20 ENCOUNTER — Telehealth: Payer: Self-pay | Admitting: Neurology

## 2019-04-20 DIAGNOSIS — Z5181 Encounter for therapeutic drug level monitoring: Secondary | ICD-10-CM

## 2019-04-20 LAB — ANGIOTENSIN CONVERTING ENZYME: Angio Convert Enzyme: 121 U/L — ABNORMAL HIGH (ref 14–82)

## 2019-04-20 LAB — COPPER, SERUM: Copper: 173 ug/dL — ABNORMAL HIGH (ref 80–158)

## 2019-04-20 LAB — HIV ANTIBODY (ROUTINE TESTING W REFLEX): HIV Screen 4th Generation wRfx: NONREACTIVE

## 2019-04-20 LAB — ANA W/REFLEX: Anti Nuclear Antibody (ANA): NEGATIVE

## 2019-04-20 LAB — RPR: RPR Ser Ql: NONREACTIVE

## 2019-04-20 LAB — SEDIMENTATION RATE: Sed Rate: 12 mm/hr (ref 0–40)

## 2019-04-20 LAB — VITAMIN B12: Vitamin B-12: 569 pg/mL (ref 232–1245)

## 2019-04-20 LAB — B. BURGDORFI ANTIBODIES: Lyme IgG/IgM Ab: <0.91 {ISR} (ref 0.00–0.90)

## 2019-04-20 NOTE — Telephone Encounter (Signed)
I called the patient.  The blood work was unremarkable except for elevations in the copper level and angiotensin-converting enzyme level.  The patient does have an inhaler which could elevate angiotensin-converting enzyme level for she has asthma.  However, the patient has a history of hepatitis C which was treated, if she had cirrhosis from this infection this could elevate both the copper level and the angiotensin-converting enzyme level.  The patient however has a history of psychiatric disease, she has been evaluated for frontal lobe dementia and has some executive function problems, and she has recent report of tremor.  For this reason, I will will check a ceruloplasmin level, and if this is low we may consider genetic testing for Wilson's disease.  She will come in to the office next week to recheck liver profile and get a ceruloplasmin level.

## 2019-04-22 ENCOUNTER — Other Ambulatory Visit (INDEPENDENT_AMBULATORY_CARE_PROVIDER_SITE_OTHER): Payer: Self-pay

## 2019-04-22 DIAGNOSIS — Z0289 Encounter for other administrative examinations: Secondary | ICD-10-CM

## 2019-04-22 NOTE — Addendum Note (Signed)
Addended by: Tamera Stands D on: 04/22/2019 11:57 AM   Modules accepted: Orders

## 2019-04-23 ENCOUNTER — Other Ambulatory Visit: Payer: Self-pay

## 2019-04-23 ENCOUNTER — Encounter: Payer: Self-pay | Admitting: Internal Medicine

## 2019-04-23 ENCOUNTER — Telehealth (INDEPENDENT_AMBULATORY_CARE_PROVIDER_SITE_OTHER): Payer: Medicare Other | Admitting: Internal Medicine

## 2019-04-23 VITALS — Temp 98.6°F | Ht 63.0 in | Wt 195.0 lb

## 2019-04-23 DIAGNOSIS — R899 Unspecified abnormal finding in specimens from other organs, systems and tissues: Secondary | ICD-10-CM | POA: Diagnosis not present

## 2019-04-23 DIAGNOSIS — R7879 Finding of abnormal level of heavy metals in blood: Secondary | ICD-10-CM

## 2019-04-23 DIAGNOSIS — F411 Generalized anxiety disorder: Secondary | ICD-10-CM

## 2019-04-23 DIAGNOSIS — R7989 Other specified abnormal findings of blood chemistry: Secondary | ICD-10-CM | POA: Diagnosis not present

## 2019-04-23 DIAGNOSIS — R202 Paresthesia of skin: Secondary | ICD-10-CM

## 2019-04-23 DIAGNOSIS — G894 Chronic pain syndrome: Secondary | ICD-10-CM

## 2019-04-23 LAB — COMPREHENSIVE METABOLIC PANEL
ALT: 19 IU/L (ref 0–32)
AST: 26 IU/L (ref 0–40)
Albumin/Globulin Ratio: 1.9 (ref 1.2–2.2)
Albumin: 4.9 g/dL — ABNORMAL HIGH (ref 3.8–4.8)
Alkaline Phosphatase: 98 IU/L (ref 39–117)
BUN/Creatinine Ratio: 36 — ABNORMAL HIGH (ref 12–28)
BUN: 33 mg/dL — ABNORMAL HIGH (ref 8–27)
Bilirubin Total: 0.2 mg/dL (ref 0.0–1.2)
CO2: 25 mmol/L (ref 20–29)
Calcium: 9.8 mg/dL (ref 8.7–10.3)
Chloride: 104 mmol/L (ref 96–106)
Creatinine, Ser: 0.92 mg/dL (ref 0.57–1.00)
GFR calc Af Amer: 76 mL/min/{1.73_m2} (ref >59)
GFR calc non Af Amer: 66 mL/min/{1.73_m2} (ref >59)
Globulin, Total: 2.6 g/dL (ref 1.5–4.5)
Glucose: 89 mg/dL (ref 65–99)
Potassium: 4.8 mmol/L (ref 3.5–5.2)
Sodium: 144 mmol/L (ref 134–144)
Total Protein: 7.5 g/dL (ref 6.0–8.5)

## 2019-04-23 LAB — CERULOPLASMIN: Ceruloplasmin: 35 mg/dL (ref 19.0–39.0)
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
## 2019-04-24 ENCOUNTER — Telehealth: Payer: Self-pay

## 2019-04-24 ENCOUNTER — Ambulatory Visit: Payer: Medicare Other | Admitting: Internal Medicine

## 2019-04-24 NOTE — Telephone Encounter (Signed)
Left vm for patient to call back about lab work results. ------ 

## 2019-04-24 NOTE — Telephone Encounter (Signed)
PT was sent mychart message about labs by Dr. Anne Hahn.

## 2019-04-26 NOTE — Telephone Encounter (Signed)
Pt has returned the call to [place], [place] please call

## 2019-04-29 NOTE — Telephone Encounter (Signed)
Dr. Anne Hahn has sent pt a mychart message about her labs that was the reason for the call.

## 2019-04-30 NOTE — Telephone Encounter (Signed)
As possible  Get [place] records of the  Urgent visit  megan you can sen her a message  On my chart.

## 2019-05-01 ENCOUNTER — Ambulatory Visit: Payer: Medicare Other

## 2019-05-08 ENCOUNTER — Ambulatory Visit: Payer: Medicare Other | Admitting: Internal Medicine

## 2019-05-09 ENCOUNTER — Other Ambulatory Visit: Payer: Self-pay | Admitting: Internal Medicine

## 2019-05-15 ENCOUNTER — Other Ambulatory Visit: Payer: Self-pay

## 2019-05-15 ENCOUNTER — Ambulatory Visit (INDEPENDENT_AMBULATORY_CARE_PROVIDER_SITE_OTHER): Payer: Medicare Other | Admitting: Internal Medicine

## 2019-05-15 ENCOUNTER — Encounter: Payer: Self-pay | Admitting: Internal Medicine

## 2019-05-15 ENCOUNTER — Encounter: Payer: Medicare Other | Admitting: Neurology

## 2019-05-15 VITALS — BP 130/78 | HR 85 | Temp 97.2°F | Ht 61.5 in | Wt 194.4 lb

## 2019-05-15 DIAGNOSIS — E785 Hyperlipidemia, unspecified: Secondary | ICD-10-CM | POA: Diagnosis not present

## 2019-05-15 DIAGNOSIS — Z8619 Personal history of other infectious and parasitic diseases: Secondary | ICD-10-CM | POA: Diagnosis not present

## 2019-05-15 DIAGNOSIS — M8580 Other specified disorders of bone density and structure, unspecified site: Secondary | ICD-10-CM

## 2019-05-15 DIAGNOSIS — Z0000 Encounter for general adult medical examination without abnormal findings: Secondary | ICD-10-CM

## 2019-05-15 DIAGNOSIS — E039 Hypothyroidism, unspecified: Secondary | ICD-10-CM | POA: Diagnosis not present

## 2019-05-15 DIAGNOSIS — Z79899 Other long term (current) drug therapy: Secondary | ICD-10-CM | POA: Diagnosis not present

## 2019-05-15 DIAGNOSIS — E038 Other specified hypothyroidism: Secondary | ICD-10-CM

## 2019-05-15 DIAGNOSIS — M5030 Other cervical disc degeneration, unspecified cervical region: Secondary | ICD-10-CM

## 2019-05-15 DIAGNOSIS — Z23 Encounter for immunization: Secondary | ICD-10-CM

## 2019-05-15 DIAGNOSIS — E2839 Other primary ovarian failure: Secondary | ICD-10-CM
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
## 2019-05-15 NOTE — Patient Instructions (Addendum)
Get  Fasting lab when convenient .  prevnar 13 today  Pneumovax next year.  Flu vaccine in  The fall   make appt for dexa at [facility] center with your mammogram . Last pap was normal and neg  high risk HPV  Can repeat in    1-2 years.   Plan  rov depending on lab results and or 6 months    Health Maintenance, Female Adopting a healthy lifestyle and getting preventive care are important in promoting health and wellness. Ask your health care provider about:  The right schedule for you to have regular tests and exams.  Things you can do on your own to prevent diseases and keep yourself healthy. What should I know about diet, weight, and exercise? Eat a healthy diet   Eat a diet that includes plenty of vegetables, fruits, low-fat dairy products, and lean protein.  Do not eat a lot of foods that are high in solid fats, added sugars, or sodium. Maintain a healthy weight Body mass index (BMI) is used to identify weight problems. It estimates body fat based on height and weight. Your health care provider can help determine your BMI and help you achieve or maintain a healthy weight. Get regular exercise Get regular exercise. This is one of the most important things you can do for your health. Most adults should:  Exercise for at least 150 minutes each week. The exercise should increase your heart rate and make you sweat (moderate-intensity exercise).  Do strengthening exercises at least twice a week. This is in addition to the moderate-intensity exercise.  Spend less time sitting. Even light physical activity can be beneficial. Watch cholesterol and blood lipids Have your blood tested for lipids and cholesterol at 66 years of age, then have this test every 5 years. Have your cholesterol levels checked more often if:  Your lipid or cholesterol levels are high.  You are older than 66 years of age.  You are at high risk for heart disease. What should I know about cancer  screening? Depending on your health history and family history, you may need to have cancer screening at various ages. This may include screening for:  Breast cancer.  Cervical cancer.  Colorectal cancer.  Skin cancer.  Lung cancer. What should I know about heart disease, diabetes, and high blood pressure? Blood pressure and heart disease  High blood pressure causes heart disease and increases the risk of stroke. This is more likely to develop in people who have high blood pressure readings, are of African descent, or are overweight.  Have your blood pressure checked: ? Every 3-5 years if you are 23-37 years of age. ? Every year if you are 58 years old or older. Diabetes Have regular diabetes screenings. This checks your fasting blood sugar level. Have the screening done:  Once every three years after age 64 if you are at a normal weight and have a low risk for diabetes.  More often and at a younger age if you are overweight or have a high risk for diabetes. What should I know about preventing infection? Hepatitis B If you have a higher risk for hepatitis B, you should be screened for this virus. Talk with your health care provider to find out if you are at risk for hepatitis B infection. Hepatitis C Testing is recommended for:  Everyone born from [birth year] through 1967.  Anyone with known risk factors for hepatitis C. Sexually transmitted infections (STIs)  Get screened for STIs, including  gonorrhea and chlamydia, if: ? You are sexually active and are younger than 66 years of age. ? You are older than 66 years of age and your health care provider tells you that you are at risk for this type of infection. ? Your sexual activity has changed since you were last screened, and you are at increased risk for chlamydia or gonorrhea. Ask your health care provider if you are at risk.  Ask your health care provider about whether you are at high risk for HIV. Your health care provider may  recommend a prescription medicine to help prevent HIV infection. If you choose to take medicine to prevent HIV, you should first get tested for HIV. You should then be tested every 3 months for as long as you are taking the medicine. Pregnancy  If you are about to stop having your period (premenopausal) and you may become pregnant, seek counseling before you get pregnant.  Take 400 to 800 micrograms (mcg) of folic acid every day if you become pregnant.  Ask for birth control (contraception) if you want to prevent pregnancy. Osteoporosis and menopause Osteoporosis is a disease in which the bones lose minerals and strength with aging. This can result in bone fractures. If you are 86 years old or older, or if you are at risk for osteoporosis and fractures, ask your health care provider if you should:  Be screened for bone loss.  Take a calcium or vitamin D supplement to lower your risk of fractures.  Be given hormone replacement therapy (HRT) to treat symptoms of menopause. Follow these instructions at home: Lifestyle  Do not use any products that contain nicotine or tobacco, such as cigarettes, e-cigarettes, and chewing tobacco. If you need help quitting, ask your health care provider.  Do not use street drugs.  Do not share needles.  Ask your health care provider for help if you need support or information about quitting drugs. Alcohol use  Do not drink alcohol if: ? Your health care provider tells you not to drink. ? You are pregnant, may be pregnant, or are planning to become pregnant.  If you drink alcohol: ? Limit how much you use to 0-1 drink a day. ? Limit intake if you are breastfeeding.  Be aware of how much alcohol is in your drink. In the [place] one drink equals one 12 oz bottle of beer (355 mL), one 5 oz glass of wine (148 mL), or one 1 oz glass of hard liquor (44 mL). General instructions  Schedule regular health, dental, and eye exams.  Stay current with your  vaccines.  Tell your health care provider if: ? You often feel depressed. ? You have ever been abused or do not feel safe at home. Summary  Adopting a healthy lifestyle and getting preventive care are important in promoting health and wellness.  Follow your health care provider's instructions about healthy diet, exercising, and getting tested or screened for diseases.  Follow your health care provider's instructions on monitoring your cholesterol and blood pressure. This information is not intended to replace advice given to you by your health care provider. Make sure you discuss any questions you have with your health care provider. Document Revised: 12/13/2017 Document Reviewed: 12/13/2017 Elsevier Patient Education  2020 [employer].

## 2019-05-30 ENCOUNTER — Other Ambulatory Visit: Payer: Self-pay

## 2019-05-31 ENCOUNTER — Other Ambulatory Visit (INDEPENDENT_AMBULATORY_CARE_PROVIDER_SITE_OTHER): Payer: Medicare Other

## 2019-05-31 ENCOUNTER — Other Ambulatory Visit: Payer: Medicare Other

## 2019-05-31 DIAGNOSIS — Z8619 Personal history of other infectious and parasitic diseases: Secondary | ICD-10-CM

## 2019-05-31 DIAGNOSIS — Z79899 Other long term (current) drug therapy: Secondary | ICD-10-CM

## 2019-05-31 DIAGNOSIS — E785 Hyperlipidemia, unspecified: Secondary | ICD-10-CM | POA: Diagnosis not present

## 2019-05-31 DIAGNOSIS — E038 Other specified hypothyroidism: Secondary | ICD-10-CM

## 2019-05-31 DIAGNOSIS — E039 Hypothyroidism, unspecified: Secondary | ICD-10-CM | POA: Diagnosis not present

## 2019-05-31 DIAGNOSIS — M5030 Other cervical disc degeneration, unspecified cervical region: Secondary | ICD-10-CM

## 2019-05-31 LAB — TSH: TSH: 4.16 u[IU]/mL (ref 0.35–4.50)

## 2019-05-31 LAB — HEPATIC FUNCTION PANEL
ALT: 22 U/L (ref 0–35)
AST: 30 U/L (ref 0–37)
Albumin: 4.2 g/dL (ref 3.5–5.2)
Alkaline Phosphatase: 71 U/L (ref 39–117)
Bilirubin, Direct: 0.1 mg/dL (ref 0.0–0.3)
Total Bilirubin: 0.3 mg/dL (ref 0.2–1.2)
Total Protein: 6.8 g/dL (ref 6.0–8.3)

## 2019-05-31 LAB — CBC WITH DIFFERENTIAL/PLATELET
Basophils Absolute: 0 10*3/uL (ref 0.0–0.1)
Basophils Relative: 0.6 % (ref 0.0–3.0)
Eosinophils Absolute: 0.1 10*3/uL (ref 0.0–0.7)
Eosinophils Relative: 2.5 % (ref 0.0–5.0)
HCT: 38.6 % (ref 36.0–46.0)
Hemoglobin: 12.7 g/dL (ref 12.0–15.0)
Lymphocytes Relative: 40.5 % (ref 12.0–46.0)
Lymphs Abs: 2.1 10*3/uL (ref 0.7–4.0)
MCHC: 32.8 g/dL (ref 30.0–36.0)
MCV: 87.6 fl (ref 78.0–100.0)
Monocytes Absolute: 0.5 10*3/uL (ref 0.1–1.0)
Monocytes Relative: 9.8 % (ref 3.0–12.0)
Neutro Abs: 2.4 10*3/uL (ref 1.4–7.7)
Neutrophils Relative %: 46.6 % (ref 43.0–77.0)
Platelets: 250 10*3/uL (ref 150.0–400.0)
RBC: 4.41 Mil/uL (ref 3.87–5.11)
RDW: 13.8 % (ref 11.5–15.5)
WBC: 5.1 10*3/uL (ref 4.0–10.5)

## 2019-05-31 LAB — LIPID PANEL
Cholesterol: 228 mg/dL — ABNORMAL HIGH (ref 0–200)
HDL: 46.2 mg/dL (ref >39.00)
LDL Cholesterol: 164 mg/dL — ABNORMAL HIGH (ref 0–99)
NonHDL: 182.18
Total CHOL/HDL Ratio: 5
Triglycerides: 90 mg/dL (ref 0.0–149.0)
VLDL: 18 mg/dL (ref 0.0–40.0)

## 2019-06-04 LAB — HEPATITIS C RNA QUANTITATIVE
HCV Quantitative Log: <1.18 Log IU/mL
HCV RNA, PCR, QN: <15 IU/mL

## 2019-06-10 ENCOUNTER — Ambulatory Visit: Payer: Medicare Other | Admitting: Neurology

## 2019-06-10 ENCOUNTER — Ambulatory Visit (INDEPENDENT_AMBULATORY_CARE_PROVIDER_SITE_OTHER): Payer: Medicare Other | Admitting: Neurology

## 2019-06-10 ENCOUNTER — Encounter: Payer: Self-pay | Admitting: Neurology

## 2019-06-10 DIAGNOSIS — M542 Cervicalgia: Secondary | ICD-10-CM | POA: Diagnosis not present

## 2019-06-10 DIAGNOSIS — R251 Tremor, unspecified: Secondary | ICD-10-CM

## 2019-06-10 DIAGNOSIS — R202 Paresthesia of skin: Secondary | ICD-10-CM

## 2019-06-10 NOTE — Procedures (Signed)
     HISTORY:  Samyrah Bruster is a 64 year old patient with a history of intermittent paresthesias affecting the right arm and right leg.  The right arm symptoms are usually worse in the morning.  The patient is being evaluated for a possible neuropathy or a radiculopathy.  NERVE CONDUCTION STUDIES:  Nerve conduction studies were performed on both upper extremities.  The distal motor latencies and motor amplitudes for the median nerves bilaterally and for the right ulnar nerve were normal with normal nerve conduction velocities seen for these nerves.  The sensory latencies for the median nerves were prolonged on the right and normal on the left and normal for the right ulnar nerve.  Right ulnar F-wave latency was normal.  Nerve conduction studies were performed on both lower extremities. The distal motor latencies and motor amplitudes for the peroneal and posterior tibial nerves were within normal limits. The nerve conduction velocities for these nerves were also normal. The sensory latencies for the peroneal and sural nerves were within normal limits. The F wave latencies for the posterior tibial nerves were within normal limits.   EMG STUDIES:  EMG study was performed on the right upper extremity:  The first dorsal interosseous muscle reveals 2 to 4 K units with full recruitment. No fibrillations or positive waves were noted. The abductor pollicis brevis muscle reveals 2 to 4 K units with full recruitment. No fibrillations or positive waves were noted. The extensor indicis proprius muscle reveals 1 to 3 K units with full recruitment. No fibrillations or positive waves were noted. The pronator teres muscle reveals 2 to 3 K units with full recruitment. No fibrillations or positive waves were noted. The biceps muscle reveals 1 to 2 K units with full recruitment. No fibrillations or positive waves were noted. The triceps muscle reveals 2 to 4 K units with full recruitment. No  fibrillations or positive waves were noted. The anterior deltoid muscle reveals 2 to 3 K units with full recruitment. No fibrillations or positive waves were noted. The cervical paraspinal muscles were tested at 2 levels. No abnormalities of insertional activity were seen at either level tested. There was good relaxation.   IMPRESSION:  Nerve conduction studies done on both upper extremities and on both lower extremities were within normal limits with exception of a mild prolongation of the right median sensory latency.  The patient may have a borderline right carpal tunnel syndrome.  EMG evaluation of the right upper extremity was unremarkable, there is no evidence of an overlying cervical radiculopathy.  Marlan Palau MD 06/10/2019 3:33 PM  [facility] [address] [place], [place] [address]  Phone [phone] Fax [phone]

## 2019-06-10 NOTE — Progress Notes (Signed)
Please refer to EMG and nerve conduction procedure note.  

## 2019-06-10 NOTE — Progress Notes (Addendum)
The patient comes in for EMG and nerve conduction study evaluation today.  The nerve conductions show only a mild prolongation of the right median sensory latency, the patient will use a wrist splint at night to see if this alleviates some of her arm symptoms.  She noted that some of the numbness improved when she went off of Adderall when she had "walking pneumonia".  She has not yet started physical therapy because of the pneumonia, I will reorder the therapy.  No evidence of neuropathy was seen, EMG of the right arm was normal.     MNC    Nerve / Sites Muscle Latency Ref. Amplitude Ref. Rel Amp Segments Distance Velocity Ref. Area    ms ms mV mV %  cm m/s m/s mVms  R Median - APB     Wrist APB 3.9 ?4.4 7.0 ?4.0 100 Wrist - APB 7   23.7     Upper arm APB 7.6  6.8  96.5 Upper arm - Wrist 21 57 ?49 23.0  L Median - APB     Wrist APB 2.9 ?4.4 6.3 ?4.0 100 Wrist - APB 7   23.0     Upper arm APB 6.5  6.1  98.2 Upper arm - Wrist 21 58 ?49 22.6  R Ulnar - ADM     Wrist ADM 2.2 ?3.3 7.7 ?6.0 100 Wrist - ADM 7   27.9     B.Elbow ADM 4.9  6.9  90.4 B.Elbow - Wrist 18 67 ?49 26.3     A.Elbow ADM 6.5  6.4  93 A.Elbow - B.Elbow 10 62 ?49 25.2         A.Elbow - Wrist      R Peroneal - EDB     Ankle EDB 3.5 ?6.5 6.2 ?2.0 100 Ankle - EDB 9   16.4     Fib head EDB 8.9  5.6  89.9 Fib head - Ankle 28 52 ?44 15.4     Pop fossa EDB 10.9  5.4  97.6 Pop fossa - Fib head 10 52 ?44 15.1         Pop fossa - Ankle      L Peroneal - EDB     Ankle EDB 4.0 ?6.5 6.6 ?2.0 100 Ankle - EDB 9   19.2     Fib head EDB 9.4  5.5  84.1 Fib head - Ankle 28 51 ?44 18.5     Pop fossa EDB 11.3  5.8  106 Pop fossa - Fib head 10 55 ?44 19.1         Pop fossa - Ankle      R Tibial - AH     Ankle AH 3.5 ?5.8 7.4 ?4.0 100 Ankle - AH 9   20.1     Pop fossa AH 11.1  7.4  99.9 Pop fossa - Ankle 35 46 ?41 18.5  L Tibial - AH     Ankle AH 3.3 ?5.8 8.4 ?4.0 100 Ankle - AH 9   15.9     Pop fossa AH 10.1  6.8  80.5 Pop fossa - Ankle  34 50 ?41 15.7                   SNC    Nerve / Sites Rec. Site Peak Lat Ref.  Amp Ref. Segments Distance    ms ms V V  cm  R Sural - Ankle (Calf)     Calf Ankle 3.5 ?4.4 8 ?6  Calf - Ankle 14  L Sural - Ankle (Calf)     Calf Ankle 3.7 ?4.4 6 ?6 Calf - Ankle 14  R Superficial peroneal - Ankle     Lat leg Ankle 3.8 ?4.4 7 ?6 Lat leg - Ankle 14  L Superficial peroneal - Ankle     Lat leg Ankle 3.6 ?4.4 5 ?6 Lat leg - Ankle 14  R Median - Orthodromic (Dig II, Mid palm)     Dig II Wrist 3.7 ?3.4 10 ?10 Dig II - Wrist 13  L Median - Orthodromic (Dig II, Mid palm)     Dig II Wrist 2.8 ?3.4 10 ?10 Dig II - Wrist 13  R Ulnar - Orthodromic, (Dig V, Mid palm)     Dig V Wrist 2.4 ?3.1 6 ?5 Dig V - Wrist 11                   F  Wave    Nerve F Lat Ref.   ms ms  R Tibial - AH 47.7 ?56.0  L Tibial - AH 47.0 ?56.0  R Ulnar - ADM 24.8 ?32.0

## 2019-06-13 NOTE — Progress Notes (Signed)
Results are good except cholesterol level is up for you  ( Ive seen this a lot in  many patients in the pandemic year!)   Attention toe lifestyle intervention healthy eating and activity.  And we can repeat   in 6- 12 months   The 10-year ASCVD risk score Denman George DC Montez Hageman., et al., 2013) is: 6.6%   Values used to calculate the score:     Age: 64 years     Sex: Female     Is Non-Hispanic African American: No     Diabetic: No     Tobacco smoker: No     Systolic Blood Pressure: 130 mmHg     Is BP treated: No     HDL Cholesterol: 46.2 mg/dL     Total Cholesterol: 228 mg/dL

## 2019-06-14 ENCOUNTER — Ambulatory Visit: Payer: Medicare Other

## 2019-06-18 ENCOUNTER — Ambulatory Visit: Payer: Medicare Other | Attending: Neurology

## 2019-06-18 ENCOUNTER — Other Ambulatory Visit: Payer: Self-pay

## 2019-06-18 DIAGNOSIS — R519 Headache, unspecified: Secondary | ICD-10-CM | POA: Insufficient documentation

## 2019-06-18 DIAGNOSIS — M5412 Radiculopathy, cervical region: Secondary | ICD-10-CM | POA: Insufficient documentation

## 2019-06-18 DIAGNOSIS — M542 Cervicalgia: Secondary | ICD-10-CM | POA: Diagnosis not present

## 2019-06-18 DIAGNOSIS — G4486 Cervicogenic headache: Secondary | ICD-10-CM

## 2019-06-18 NOTE — Therapy (Signed)
[facility] [address] [place], [place], [address] Phone: [phone]   Fax:  [phone]  Physical Therapy Evaluation  Patient Details  Name: Toni [name] MRN: [id number] Date of Birth: 09-Dec-1955 Referring Provider (PT): Dr. Stephanie Acre   Encounter Date: 06/18/2019   PT End of Session - 06/18/19 1917    Visit Number 1    Number of Visits 17    Date for PT Re-Evaluation 08/13/19    Authorization Type [employer] medicare 10 visit medicare guidelines    Progress Note Due on Visit 10    PT Start Time 1015    PT Stop Time 1100    PT Time Calculation (min) 45 min    Activity Tolerance Patient tolerated treatment well    Behavior During Therapy [facility] for tasks assessed/performed           Past Medical History:  Diagnosis Date  . Brachial neuritis or radiculitis NOS   . Chronic hepatitis C (HCC) 10/05/2015  . Depressive disorder, not elsewhere classified   . Essential and other specified forms of tremor   . Fracture of 5th metatarsal 08/13/2017  . Fracture of multiple pubic rami (HCC) 08/13/2017  . Headache(784.0)   . Hepatitis C infection 12/05/2017   Treated and now resolved and "cured " as of early 2018  And     . Memory loss   . Microscopic hematuria    seed dr Patsi Sears in past  . Migraine, unspecified, without mention of intractable migraine without mention of status migrainosus   . Multiple rib fractures 08/13/2017  . Other specified disorders of rotator cuff syndrome of shoulder and allied disorders   . Vulvar pain    rx with valium supp    Past Surgical History:  Procedure Laterality Date  . BREAST EXCISIONAL BIOPSY Right    No scar  . CESAREAN SECTION     x3  . SHOULDER ARTHROSCOPY W/ ROTATOR CUFF REPAIR Right 2004  . SHOULDER ARTHROSCOPY W/ ROTATOR CUFF REPAIR Left 2008    There were no vitals filed for this visit.    Subjective Assessment - 06/18/19 1903    Subjective Hx of  migraine headaches since she was teenager. Her migraine symptoms are often brought on by not eating and she feels pain behind the eyes with nausea and vomiting.  Fall in 07/2016 exacernated her neck pain. She fell and hit back of her head and has increased pain since then. Pt also reports photophobia and phonophobia with migraines. Pt reports migraines may happen 3-4x.month and twice a year, headaches will linger for 5 or more days straight. Chronic neck pain/shoulder pain with cervicogenic headaches that is worst at night. Pt reports intermittent numbness in R hand which is worst in the morning. Pt reports she has lot of stress in her life Pt reports stress and anxiety also triggers in her hands.    Pertinent History brachial neuritis, migraines, anxiety, tremors    Limitations Sitting;Reading;Lifting;Standing;Walking;House hold activities;Other (comment)    How long can you sit comfortably? 10 min without readjusting    How long can you stand comfortably? 10 min but pain increases    How long can you walk comfortably? pt reports jolting pain in neck with stepping    Currently in Pain? Yes    Pain Score 10-Worst pain ever    Pain Location Neck    Pain Orientation Right;Left    Pain Descriptors / Indicators Aching;Throbbing;Tightness;Radiating    Pain Type Chronic pain  Pain Onset More than a month ago    Pain Frequency Constant    Aggravating Factors  reading, phone use, prolonged sitting, standing, walking    Pain Relieving Factors position changes              [facility] PT Assessment - 06/18/19 1906      Assessment   Medical Diagnosis Cervicalgia, cervical radiculopathy    Referring Provider (PT) Dr. Stephanie Acre    Onset Date/Surgical Date 06/10/19    Hand Dominance Left    Prior Therapy none      Precautions   Precautions None      Restrictions   Weight Bearing Restrictions No      Balance Screen   Has the patient fallen in the past 6 months No      Home Environment    Living Environment Private residence    Living Arrangements Children   7 yr old grandchild   Type of Home House    Home Layout Two level    Alternate Level Stairs-Number of Steps 10      Prior Function   Level of Independence Independent      Cognition   Overall Cognitive Status Within Functional Limits for tasks assessed    Attention Focused    Focused Attention Appears intact    Memory Appears intact    Awareness Appears intact    Problem Solving Appears intact      ROM / Strength   AROM / PROM / Strength AROM;Strength      AROM   AROM Assessment Site Cervical    Cervical Flexion 35 pain    Cervical Extension 50 pain    Cervical - Right Rotation 45 pain    Cervical - Left Rotation 45 pain      Strength   Strength Assessment Site Hand    Right/Left hand Right;Left    Right Hand Gross Grasp Functional    Right Hand Grip (lbs) 42.8    Left Hand Grip (lbs) 43.4                      Objective measurements completed on examination: See above findings.                 PT Short Term Goals - 06/18/19 1910      PT SHORT TERM GOAL #1   Title Patient will report <8/10 pain with functional activities to improve overall function at home    Baseline 10/10 (eval)    Time 4    Period Weeks    Status New    Target Date 07/16/19      PT SHORT TERM GOAL #2   Title Patient will dmeo 50% compliance with HEP to improve self management of her symptoms    Baseline not doing any exercises    Time 4    Period Weeks    Status New    Target Date 07/16/19      PT SHORT TERM GOAL #3   Title Patient will demo improved cervical rotation to 55 deg bil to improve ability to turn head without significant pain with driving    Baseline 45 deg rotation bil with pain of 9/10 at end range of motion (eval)    Time 4    Period Weeks    Status New    Target Date 07/16/19             PT Long Term Goals - 06/18/19 1911  PT LONG TERM GOAL #1   Title Patient  will report pain decrease to 6 or better out of 10 with functional activities to improve overall function    Baseline 10/10    Time 8    Period Weeks    Status New    Target Date 08/13/19      PT LONG TERM GOAL #2   Title Patient will demo improved grip strength to 50lbs bil hands to improve functional grip strength with ADLs    Baseline ~43lbs bil    Time 8    Period Weeks    Status New    Target Date 08/13/19      PT LONG TERM GOAL #3   Title Pt will be 100% compliant with HEP to self manage her symptoms      PT LONG TERM GOAL #4   Title NDI goal    Baseline TBD    Time 8    Period Weeks    Status New    Target Date 08/13/19                  Plan - 06/18/19 1100    Clinical Impression Statement Patient is a 64 yo female who was seen today for physical therapy evaluation and treatment for chronic neck pain with cervical radiculopathy in R UE. Patient demonstrates decreased cervical AROM, increased soft tissue tightness, decreased grip strength and pain. These impairments are limiting patient from driving, lifting, carrying, taking care of her 41 yr old grandson. Patient will benefit from skilled PT to improve cervical AROM, decrease pain and improve overall function    Personal Factors and Comorbidities Age;Past/Current Experience;Time since onset of injury/illness/exacerbation;Comorbidity 2    Comorbidities brachial neuritis, migraines, anxiety, tremors    Examination-Activity Limitations Caring for Others;Carry;Lift;Sleep;Stand    Examination-Participation Restrictions Cleaning;Community Activity;Driving;Laundry;Meal Prep;Shop;Yard Work    Stability/Clinical Decision Making Evolving/Moderate complexity    Clinical Decision Making Moderate    Rehab Potential Good    PT Frequency 2x / week    PT Duration 8 weeks    PT Treatment/Interventions ADLs/Self Care Home Management;Moist Heat;Therapeutic activities;Therapeutic exercise;Neuromuscular re-education;Patient/family  education;Manual techniques;Passive range of motion;Joint Manipulations    PT Next Visit Plan Give NDI    PT Home Exercise Plan Access Code: [email or web address]: [email or web address]: 06/15/2021Prepared by: Gwenyth Bouillon PatelExercisesSupine Chin Tuck - 1 x daily - 7 x weekly - 1 sets - 10 repsSupine Deep Neck Flexor Training - Repetitions - 1 x daily - 7 x weekly - 1 sets - 10 reps           Patient will benefit from skilled therapeutic intervention in order to improve the following deficits and impairments:  Decreased activity tolerance, Decreased endurance, Decreased range of motion, Decreased strength, Pain, Impaired flexibility, Impaired tone, Postural dysfunction, Impaired sensation  Visit Diagnosis: Cervicalgia  Cervicogenic headache  Radiculopathy, cervical region     Problem List Patient Active Problem List   Diagnosis Date Noted  . Chronic pain 08/13/2017  . Left groin pain 08/05/2017  . Occipital neuralgia of right side 10/21/2016  . Chronic migraine 01/26/2015  . Cervicogenic headache 12/16/2014  . Subclinical hypothyroidism 12/16/2014  . Memory loss 10/08/2014  . Depression 10/08/2014  . Generalized anxiety disorder 10/08/2014  . Tremor 10/08/2014  . Major depressive disorder, single episode, moderate ([place]) 08/27/2014  . Memory difficulties 08/27/2014  . Degenerative disc disease, cervical 08/27/2014  . Chronically on opiate therapy 08/27/2014    Kerrie Pleasure, PT 06/18/2019, 7:20 PM  [facility] [address] [place], [place], [address] Phone: [phone]   Fax:  [phone]  Name: Toni [name] MRN: [id number] Date of Birth: 09-03-1955

## 2019-06-21 ENCOUNTER — Ambulatory Visit: Payer: Medicare Other

## 2019-06-24 ENCOUNTER — Other Ambulatory Visit: Payer: Self-pay

## 2019-06-24 ENCOUNTER — Ambulatory Visit: Payer: Medicare Other

## 2019-06-24 DIAGNOSIS — M542 Cervicalgia: Secondary | ICD-10-CM

## 2019-06-24 DIAGNOSIS — M5412 Radiculopathy, cervical region: Secondary | ICD-10-CM

## 2019-06-24 DIAGNOSIS — G4486 Cervicogenic headache: Secondary | ICD-10-CM

## 2019-06-24 NOTE — Therapy (Signed)
[facility] [address] [place], [place], [address] Phone: [phone]   Fax:  [phone]  Physical Therapy Treatment  Patient Details  Name: Toni [name] MRN: [id number] Date of Birth: 1955/12/27 Referring Provider (PT): Dr. Stephanie Acre   Encounter Date: 06/24/2019    Past Medical History:  Diagnosis Date  . Brachial neuritis or radiculitis NOS   . Chronic hepatitis C (HCC) 10/05/2015  . Depressive disorder, not elsewhere classified   . Essential and other specified forms of tremor   . Fracture of 5th metatarsal 08/13/2017  . Fracture of multiple pubic rami (HCC) 08/13/2017  . Headache(784.0)   . Hepatitis C infection 12/05/2017   Treated and now resolved and "cured " as of early 2018  And     . Memory loss   . Microscopic hematuria    seed dr Patsi Sears in past  . Migraine, unspecified, without mention of intractable migraine without mention of status migrainosus   . Multiple rib fractures 08/13/2017  . Other specified disorders of rotator cuff syndrome of shoulder and allied disorders   . Vulvar pain    rx with valium supp    Past Surgical History:  Procedure Laterality Date  . BREAST EXCISIONAL BIOPSY Right    No scar  . CESAREAN SECTION     x3  . SHOULDER ARTHROSCOPY W/ ROTATOR CUFF REPAIR Right 2004  . SHOULDER ARTHROSCOPY W/ ROTATOR CUFF REPAIR Left 2008    There were no vitals filed for this visit.   Subjective Assessment - 06/24/19 1459    Subjective Pt reports she felt better after last time. Housework, bending forward to pick things up, it exaggerates her pain.    Pertinent History brachial neuritis, migraines, anxiety, tremors    Limitations Sitting;Lifting;Standing;Walking;House hold activities;Other (comment)    How long can you sit comfortably? 10 min without readjusting    How long can you stand comfortably? 10 min but pain increases    How long can you walk comfortably?  pt reports jolting pain in neck with stepping    Currently in Pain? Yes    Pain Location Neck    Pain Orientation Right;Left    Pain Descriptors / Indicators Aching;Tightness;Spasm;Heaviness    Pain Type Chronic pain    Pain Onset More than a month ago    Pain Frequency Constant              Treatment STM To bil cervical paraspinalis, scalenes, upper trap, suboccipital region Grade I-II upper and loewr cervical lateral glides bil  Supine chin tuck: 20 x 5" holds Supine chin tuck with head lift: 2 x 10  Current HEP: chin tuck, chin tuck with head lift.                         PT Short Term Goals - 06/18/19 1910      PT SHORT TERM GOAL #1   Title Patient will report <8/10 pain with functional activities to improve overall function at home    Baseline 10/10 (eval)    Time 4    Period Weeks    Status New    Target Date 07/16/19      PT SHORT TERM GOAL #2   Title Patient will dmeo 50% compliance with HEP to improve self management of her symptoms    Baseline not doing any exercises    Time 4    Period Weeks    Status New  Target Date 07/16/19      PT SHORT TERM GOAL #3   Title Patient will demo improved cervical rotation to 55 deg bil to improve ability to turn head without significant pain with driving    Baseline 45 deg rotation bil with pain of 9/10 at end range of motion (eval)    Time 4    Period Weeks    Status New    Target Date 07/16/19             PT Long Term Goals - 06/18/19 1911      PT LONG TERM GOAL #1   Title Patient will report pain decrease to 6 or better out of 10 with functional activities to improve overall function    Baseline 10/10    Time 8    Period Weeks    Status New    Target Date 08/13/19      PT LONG TERM GOAL #2   Title Patient will demo improved grip strength to 50lbs bil hands to improve functional grip strength with ADLs    Baseline ~43lbs bil    Time 8    Period Weeks    Status New    Target  Date 08/13/19      PT LONG TERM GOAL #3   Title Pt will be 100% compliant with HEP to self manage her symptoms      PT LONG TERM GOAL #4   Title NDI goal    Baseline TBD    Time 8    Period Weeks    Status New    Target Date 08/13/19                  Patient will benefit from skilled therapeutic intervention in order to improve the following deficits and impairments:     Visit Diagnosis: Cervicalgia  Cervicogenic headache  Radiculopathy, cervical region     Problem List Patient Active Problem List   Diagnosis Date Noted  . Chronic pain 08/13/2017  . Left groin pain 08/05/2017  . Occipital neuralgia of right side 10/21/2016  . Chronic migraine 01/26/2015  . Cervicogenic headache 12/16/2014  . Subclinical hypothyroidism 12/16/2014  . Memory loss 10/08/2014  . Depression 10/08/2014  . Generalized anxiety disorder 10/08/2014  . Tremor 10/08/2014  . Major depressive disorder, single episode, moderate ([place]) 08/27/2014  . Memory difficulties 08/27/2014  . Degenerative disc disease, cervical 08/27/2014  . Chronically on opiate therapy 08/27/2014    Kerrie Pleasure 06/24/2019, 3:00 PM  [place] [address] [place], [place], [address] Phone: [phone]   Fax:  [phone]  Name: Berneita Sanagustin MRN: [id number] Date of Birth: 01-01-1956

## 2019-06-27 ENCOUNTER — Ambulatory Visit: Payer: Medicare Other

## 2019-06-27 ENCOUNTER — Other Ambulatory Visit: Payer: Self-pay

## 2019-06-27 DIAGNOSIS — M5412 Radiculopathy, cervical region: Secondary | ICD-10-CM

## 2019-06-27 DIAGNOSIS — M542 Cervicalgia: Secondary | ICD-10-CM

## 2019-06-27 DIAGNOSIS — G4486 Cervicogenic headache: Secondary | ICD-10-CM

## 2019-06-27 NOTE — Therapy (Signed)
[facility] [address] [place], [place], [address] Phone: [phone]   Fax:  [phone]  Physical Therapy Treatment  Patient Details  Name: Toni [name] MRN: [id number] Date of Birth: 01-Mar-1955 Referring Provider (PT): Dr. Stephanie Acre   Encounter Date: 06/27/2019   PT End of Session - 06/27/19 1252    Visit Number 3    Number of Visits 17    Date for PT Re-Evaluation 08/13/19    Authorization Type UHC medicare 10 visit medicare guidelines    Progress Note Due on Visit 10    PT Start Time 1155    PT Stop Time 1235    PT Time Calculation (min) 40 min    Activity Tolerance Patient tolerated treatment well    Behavior During Therapy [facility] for tasks assessed/performed           Past Medical History:  Diagnosis Date  . Brachial neuritis or radiculitis NOS   . Chronic hepatitis C (HCC) 10/05/2015  . Depressive disorder, not elsewhere classified   . Essential and other specified forms of tremor   . Fracture of 5th metatarsal 08/13/2017  . Fracture of multiple pubic rami (HCC) 08/13/2017  . Headache(784.0)   . Hepatitis C infection 12/05/2017   Treated and now resolved and "cured " as of early 2018  And     . Memory loss   . Microscopic hematuria    seed dr Patsi Sears in past  . Migraine, unspecified, without mention of intractable migraine without mention of status migrainosus   . Multiple rib fractures 08/13/2017  . Other specified disorders of rotator cuff syndrome of shoulder and allied disorders   . Vulvar pain    rx with valium supp    Past Surgical History:  Procedure Laterality Date  . BREAST EXCISIONAL BIOPSY Right    No scar  . CESAREAN SECTION     x3  . SHOULDER ARTHROSCOPY W/ ROTATOR CUFF REPAIR Right 2004  . SHOULDER ARTHROSCOPY W/ ROTATOR CUFF REPAIR Left 2008    There were no vitals filed for this visit.   Subjective Assessment - 06/27/19 1248    Subjective Pt reports  after last session, she was very debilitated. She had hard time functinoing at home due to increased pain. She had to call her son to help out with her grandson.    Pertinent History brachial neuritis, migraines, anxiety, tremors    Limitations Sitting;Lifting;Standing;Walking;House hold activities;Other (comment)    How long can you sit comfortably? 10 min without readjusting    How long can you stand comfortably? 10 min but pain increases    How long can you walk comfortably? pt reports jolting pain in neck with stepping                Manual therapy: Very light soft tissue mobilization to bil middle and posterior scalenes, and upper trap  TherEx: Worked on supine chin tuck and bil cervical rotations: 5-10 reps, cues for relaxing accessory muscles, cues on controlling the motion to assure pain doesn't increase from baseline. Patient has poor awareness of accessory muscle tensing and needs tactile and verbal cues to improve relaxation.  Current HEP: chin tuck, chin tuck with head lift 06/27/2019 : work on supine chin tuck and cervical rotations- working on controlling range to prevent increase in pain, working on 15 min a time, few times throughout the day.  PT Education - 06/27/19 1252    Education Details See Plan for details            PT Short Term Goals - 06/18/19 1910      PT SHORT TERM GOAL #1   Title Patient will report <8/10 pain with functional activities to improve overall function at home    Baseline 10/10 (eval)    Time 4    Period Weeks    Status New    Target Date 07/16/19      PT SHORT TERM GOAL #2   Title Patient will dmeo 50% compliance with HEP to improve self management of her symptoms    Baseline not doing any exercises    Time 4    Period Weeks    Status New    Target Date 07/16/19      PT SHORT TERM GOAL #3   Title Patient will demo improved cervical rotation to 55 deg bil to improve ability to turn head  without significant pain with driving    Baseline 45 deg rotation bil with pain of 9/10 at end range of motion (eval)    Time 4    Period Weeks    Status New    Target Date 07/16/19             PT Long Term Goals - 06/18/19 1911      PT LONG TERM GOAL #1   Title Patient will report pain decrease to 6 or better out of 10 with functional activities to improve overall function    Baseline 10/10    Time 8    Period Weeks    Status New    Target Date 08/13/19      PT LONG TERM GOAL #2   Title Patient will demo improved grip strength to 50lbs bil hands to improve functional grip strength with ADLs    Baseline ~43lbs bil    Time 8    Period Weeks    Status New    Target Date 08/13/19      PT LONG TERM GOAL #3   Title Pt will be 100% compliant with HEP to self manage her symptoms      PT LONG TERM GOAL #4   Title NDI goal    Baseline TBD    Time 8    Period Weeks    Status New    Target Date 08/13/19                 Plan - 06/27/19 1249    Clinical Impression Statement Today's session was focused on pain neuroscience education. We decreased the manual therapy intensity by decreasing pressure and decreasing time spent during the session. Pt was educated on working on cervical ROM in small range that doesn't increase her baseline pain. She was educated on performing ROM without tensing accessory muscles during motion. Pt was educated on performing movements for 15 min at a time thorughout the day to help decrease hypersensitive nerovous system. Patient reported no signifcant change in her pain but she reported that she was not feeling as anxious and jittery at end of the session from pain as she came in.    Personal Factors and Comorbidities Age;Past/Current Experience;Time since onset of injury/illness/exacerbation;Comorbidity 2    Comorbidities brachial neuritis, migraines, anxiety, tremors    Examination-Activity Limitations Caring for Others;Carry;Lift;Sleep;Stand     Examination-Participation Restrictions Cleaning;Community Activity;Driving;Laundry;Meal Prep;Shop;Yard Work    Stability/Clinical Decision Making Evolving/Moderate complexity    Rehab  Potential Good    PT Frequency 2x / week    PT Duration 8 weeks    PT Treatment/Interventions ADLs/Self Care Home Management;Moist Heat;Therapeutic activities;Therapeutic exercise;Neuromuscular re-education;Patient/family education;Manual techniques;Passive range of motion;Joint Manipulations    PT Next Visit Plan Light soft tissue only, limit manual therapy to 15 min, work on pain management strategies and pain neuroscience education    PT Home Exercise Plan Access Code: [email or web address]: [email or web address]: 06/15/2021Prepared by: Gwenyth Bouillon PatelExercisesSupine Chin Tuck - 1 x daily - 7 x weekly - 1 sets - 10 repsSupine Deep Neck Flexor Training - Repetitions - 1 x daily - 7 x weekly - 1 sets - 10 reps           Patient will benefit from skilled therapeutic intervention in order to improve the following deficits and impairments:  Decreased activity tolerance, Decreased endurance, Decreased range of motion, Decreased strength, Pain, Impaired flexibility, Impaired tone, Postural dysfunction, Impaired sensation  Visit Diagnosis: Cervicalgia  Cervicogenic headache  Radiculopathy, cervical region     Problem List Patient Active Problem List   Diagnosis Date Noted  . Chronic pain 08/13/2017  . Left groin pain 08/05/2017  . Occipital neuralgia of right side 10/21/2016  . Chronic migraine 01/26/2015  . Cervicogenic headache 12/16/2014  . Subclinical hypothyroidism 12/16/2014  . Memory loss 10/08/2014  . Depression 10/08/2014  . Generalized anxiety disorder 10/08/2014  . Tremor 10/08/2014  . Major depressive disorder, single episode, moderate ([place]) 08/27/2014  . Memory difficulties 08/27/2014  . Degenerative disc disease, cervical 08/27/2014  . Chronically on opiate therapy 08/27/2014     Kerrie Pleasure 06/27/2019, 12:53 PM  [place] [address] [place], [place], [address] Phone: [phone]   Fax:  [phone]  Name: Toni [name] MRN: [id number] Date of Birth: 02/10/1955

## 2019-07-01 ENCOUNTER — Ambulatory Visit: Payer: Medicare Other

## 2019-07-04 ENCOUNTER — Ambulatory Visit: Payer: Medicare Other | Attending: Neurology

## 2019-07-04 ENCOUNTER — Other Ambulatory Visit: Payer: Self-pay

## 2019-07-04 DIAGNOSIS — M5412 Radiculopathy, cervical region: Secondary | ICD-10-CM | POA: Diagnosis present

## 2019-07-04 DIAGNOSIS — R519 Headache, unspecified: Secondary | ICD-10-CM | POA: Diagnosis present

## 2019-07-04 DIAGNOSIS — M542 Cervicalgia: Secondary | ICD-10-CM | POA: Diagnosis present

## 2019-07-04 DIAGNOSIS — G4486 Cervicogenic headache: Secondary | ICD-10-CM

## 2019-07-04 NOTE — Therapy (Signed)
[facility] [address] [place], [place], [address] Phone: [phone]   Fax:  [phone]  Physical Therapy Treatment  Patient Details  Name: Toni [name] MRN: [id number] Date of Birth: [birth year] Referring Provider (PT): Dr. Stephanie Acre   Encounter Date: 07/04/2019   PT End of Session - 07/04/19 1306    Visit Number 4    Number of Visits 17    Date for PT Re-Evaluation 08/13/19    Authorization Type UHC medicare 10 visit medicare guidelines    Progress Note Due on Visit 10    PT Start Time 1100    PT Stop Time 1150    PT Time Calculation (min) 50 min    Activity Tolerance Patient tolerated treatment well    Behavior During Therapy [facility] for tasks assessed/performed           Past Medical History:  Diagnosis Date   Brachial neuritis or radiculitis NOS    Chronic hepatitis C (HCC) 10/05/2015   Depressive disorder, not elsewhere classified    Essential and other specified forms of tremor    Fracture of 5th metatarsal 08/13/2017   Fracture of multiple pubic rami (HCC) 08/13/2017   Headache(784.0)    Hepatitis C infection 12/05/2017   Treated and now resolved and "cured " as of early 2018  And      Memory loss    Microscopic hematuria    seed dr Patsi Sears in past   Migraine, unspecified, without mention of intractable migraine without mention of status migrainosus    Multiple rib fractures 08/13/2017   Other specified disorders of rotator cuff syndrome of shoulder and allied disorders    Vulvar pain    rx with valium supp    Past Surgical History:  Procedure Laterality Date   BREAST EXCISIONAL BIOPSY Right    No scar   CESAREAN SECTION     x3   SHOULDER ARTHROSCOPY W/ ROTATOR CUFF REPAIR Right 2004   SHOULDER ARTHROSCOPY W/ ROTATOR CUFF REPAIR Left 2008    There were no vitals filed for this visit.   Subjective Assessment - 07/04/19 1300    Subjective Pt reports  today her pain isn't too bad but on Monday and yesterday, she had more soreness. Pt reports that she is trying to be cognicent ofbeing relaxing her muscles throughout the day.    Pertinent History brachial neuritis, migraines, anxiety, tremors    Limitations Sitting;Lifting;Standing;Walking;House hold activities;Other (comment)    How long can you sit comfortably? 10 min without readjusting    How long can you stand comfortably? 10 min but pain increases    How long can you walk comfortably? pt reports jolting pain in neck with stepping    Currently in Pain? Yes    Pain Score 2     Pain Location Neck                   Manual therapy: Very light soft tissue mobilization to bil middle and posterior scalenes, and upper trap Applied vibration over R suboccipital region for 5' with small hand helt vibrator to desensitize tissue sensitivity as patient reported increased pain with light touch over R posterior skull.  TherEx: Worked on supine chin tuck and bil cervical rotations:20 reps, seated rotations: 10x cues for relaxing accessory muscles, cues on controlling the motion to assure pain doesn't increase from baseline. Patient has poor awareness of accessory muscle tensing and needs tactile and verbal cues to improve relaxation.  Current HEP: chin tuck, chin tuck with head lift 06/27/2019 : work on supine chin tuck and cervical rotations- working on controlling range to prevent increase in pain, working on 15 min a time, few times throughout the day.                     PT Short Term Goals - 06/18/19 1910      PT SHORT TERM GOAL #1   Title Patient will report <8/10 pain with functional activities to improve overall function at home    Baseline 10/10 (eval)    Time 4    Period Weeks    Status New    Target Date 07/16/19      PT SHORT TERM GOAL #2   Title Patient will dmeo 50% compliance with HEP to improve self management of her symptoms    Baseline not doing any  exercises    Time 4    Period Weeks    Status New    Target Date 07/16/19      PT SHORT TERM GOAL #3   Title Patient will demo improved cervical rotation to 55 deg bil to improve ability to turn head without significant pain with driving    Baseline 45 deg rotation bil with pain of 9/10 at end range of motion (eval)    Time 4    Period Weeks    Status New    Target Date 07/16/19             PT Long Term Goals - 06/18/19 1911      PT LONG TERM GOAL #1   Title Patient will report pain decrease to 6 or better out of 10 with functional activities to improve overall function    Baseline 10/10    Time 8    Period Weeks    Status New    Target Date 08/13/19      PT LONG TERM GOAL #2   Title Patient will demo improved grip strength to 50lbs bil hands to improve functional grip strength with ADLs    Baseline ~43lbs bil    Time 8    Period Weeks    Status New    Target Date 08/13/19      PT LONG TERM GOAL #3   Title Pt will be 100% compliant with HEP to self manage her symptoms      PT LONG TERM GOAL #4   Title NDI goal    Baseline TBD    Time 8    Period Weeks    Status New    Target Date 08/13/19                 Plan - 07/04/19 1301    Clinical Impression Statement Today's session was focused on continued manual therapy to improve joint mobility and reduce soft tissue restrictions to improve ROM and pain. Patient was educated throughout the session to continue to modify her activities throughout the day to limit flare ups. Pt was educated that when she feels better, she shouldn't do activities that her moderate outside her daily norms to prevent flare ups of her pain. Pt tends to be very guarded with her movement and very focused on "alignment", "degeneration of her spine" with movement. Pt was educated on working on range of movements that doesn't creat any extra pain from her baseline to start disassociating movement from pain.    Personal Factors and  Comorbidities Age;Past/Current Experience;Time since onset of  injury/illness/exacerbation;Comorbidity 2    Comorbidities brachial neuritis, migraines, anxiety, tremors    Examination-Activity Limitations Caring for Others;Carry;Lift;Sleep;Stand    Examination-Participation Restrictions Cleaning;Community Activity;Driving;Laundry;Meal Prep;Shop;Yard Work    Stability/Clinical Decision Making Evolving/Moderate complexity    Rehab Potential Good    PT Frequency 2x / week    PT Duration 8 weeks    PT Treatment/Interventions ADLs/Self Care Home Management;Moist Heat;Therapeutic activities;Therapeutic exercise;Neuromuscular re-education;Patient/family education;Manual techniques;Passive range of motion;Joint Manipulations    PT Next Visit Plan Light soft tissue only, limit manual therapy to 15 min, work on pain management strategies and pain neuroscience education    PT Home Exercise Plan Access Code: [email or web address]: [email or web address]: 06/15/2021Prepared by: Theone Murdoch PatelExercisesSupine Chin Tuck - 1 x daily - 7 x weekly - 1 sets - 10 repsSupine Deep Neck Flexor Training - Repetitions - 1 x daily - 7 x weekly - 1 sets - 10 reps           Patient will benefit from skilled therapeutic intervention in order to improve the following deficits and impairments:  Decreased activity tolerance, Decreased endurance, Decreased range of motion, Decreased strength, Pain, Impaired flexibility, Impaired tone, Postural dysfunction, Impaired sensation  Visit Diagnosis: Cervicalgia  Cervicogenic headache  Radiculopathy, cervical region     Problem List Patient Active Problem List   Diagnosis Date Noted   Chronic pain 08/13/2017   Left groin pain 08/05/2017   Occipital neuralgia of right side 10/21/2016   Chronic migraine 01/26/2015   Cervicogenic headache 12/16/2014   Subclinical hypothyroidism 12/16/2014   Memory loss 10/08/2014   Depression 10/08/2014   Generalized anxiety  disorder 10/08/2014   Tremor 10/08/2014   Major depressive disorder, single episode, moderate (HCC) 08/27/2014   Memory difficulties 08/27/2014   Degenerative disc disease, cervical 08/27/2014   Chronically on opiate therapy 08/27/2014    Ileana Ladd 07/04/2019, 1:07 PM  [facility] [address] [place], [place], [address] Phone: [phone]   Fax:  [phone]  Name: Toni [name] MRN: [id number] Date of Birth: [birth year]

## 2019-07-11 ENCOUNTER — Other Ambulatory Visit: Payer: Self-pay

## 2019-07-11 ENCOUNTER — Ambulatory Visit: Payer: Medicare Other

## 2019-07-11 DIAGNOSIS — M5412 Radiculopathy, cervical region: Secondary | ICD-10-CM

## 2019-07-11 DIAGNOSIS — M542 Cervicalgia: Secondary | ICD-10-CM

## 2019-07-11 DIAGNOSIS — G4486 Cervicogenic headache: Secondary | ICD-10-CM

## 2019-07-11 NOTE — Therapy (Signed)
[facility] [address] [place], [place], [address] Phone: [phone]   Fax:  [phone]  Physical Therapy Treatment  Patient Details  Name: Nonnie Pickney MRN: [id number] Date of Birth: [birth year] Referring Provider (PT): Dr. Stephanie Acre   Encounter Date: 07/11/2019   PT End of Session - 07/11/19 1302    Visit Number 5    Number of Visits 17    Date for PT Re-Evaluation 08/13/19    Authorization Type U[employer] medicare 10 visit medicare guidelines    Progress Note Due on Visit 10    PT Start Time 1200    PT Stop Time 1300   last 15 min for Hot pack   PT Time Calculation (min) 60 min    Activity Tolerance Patient tolerated treatment well    Behavior During Therapy [facility] for tasks assessed/performed           Past Medical History:  Diagnosis Date  . Brachial neuritis or radiculitis NOS   . Chronic hepatitis C (HCC) 10/05/2015  . Depressive disorder, not elsewhere classified   . Essential and other specified forms of tremor   . Fracture of 5th metatarsal 08/13/2017  . Fracture of multiple pubic rami (HCC) 08/13/2017  . Headache(784.0)   . Hepatitis C infection 12/05/2017   Treated and now resolved and "cured " as of early 2018  And     . Memory loss   . Microscopic hematuria    seed dr Patsi Sears in past  . Migraine, unspecified, without mention of intractable migraine without mention of status migrainosus   . Multiple rib fractures 08/13/2017  . Other specified disorders of rotator cuff syndrome of shoulder and allied disorders   . Vulvar pain    rx with valium supp    Past Surgical History:  Procedure Laterality Date  . BREAST EXCISIONAL BIOPSY Right    No scar  . CESAREAN SECTION     x3  . SHOULDER ARTHROSCOPY W/ ROTATOR CUFF REPAIR Right 2004  . SHOULDER ARTHROSCOPY W/ ROTATOR CUFF REPAIR Left 2008    There were no vitals filed for this visit.   Subjective Assessment - 07/11/19 1257     Subjective Pt reports that i am in lot of pain today. I have not taken my pain meds before I came today. I want to get a MRI because my last one was too long ago. "I know my facet joints are messed up"    Pertinent History brachial neuritis, migraines, anxiety, tremors    Limitations Sitting;Lifting;Standing;Walking;House hold activities;Other (comment)    How long can you sit comfortably? 10 min without readjusting    How long can you stand comfortably? 10 min but pain increases    How long can you walk comfortably? pt reports jolting pain in neck with stepping                  Manual therapy: Very light soft tissue mobilization to bil middle and posterior scalenes, and upper trap, suboccipital region, levator scapule  TherEx: Supine slow paced cervical rotations: 30x Supine chin tuck with rotations: 20x R and L Supine chin tuck with lateral flexion: 10x  Hot pack to c-spine 15' in sitting position. Hot pack in pillow case, pt educated to take hot pack off if it feels too hot.  Current HEP: chin tuck, chin tuck with head lift 06/27/2019 : work on supine chin tuck and cervical rotations- working on controlling range to prevent increase in pain,  working on 15 min a time, few times throughout the day.                       PT Short Term Goals - 06/18/19 1910      PT SHORT TERM GOAL #1   Title Patient will report <8/10 pain with functional activities to improve overall function at home    Baseline 10/10 (eval)    Time 4    Period Weeks    Status New    Target Date 07/16/19      PT SHORT TERM GOAL #2   Title Patient will dmeo 50% compliance with HEP to improve self management of her symptoms    Baseline not doing any exercises    Time 4    Period Weeks    Status New    Target Date 07/16/19      PT SHORT TERM GOAL #3   Title Patient will demo improved cervical rotation to 55 deg bil to improve ability to turn head without significant pain with driving     Baseline 45 deg rotation bil with pain of 9/10 at end range of motion (eval)    Time 4    Period Weeks    Status New    Target Date 07/16/19             PT Long Term Goals - 06/18/19 1911      PT LONG TERM GOAL #1   Title Patient will report pain decrease to 6 or better out of 10 with functional activities to improve overall function    Baseline 10/10    Time 8    Period Weeks    Status New    Target Date 08/13/19      PT LONG TERM GOAL #2   Title Patient will demo improved grip strength to 50lbs bil hands to improve functional grip strength with ADLs    Baseline ~43lbs bil    Time 8    Period Weeks    Status New    Target Date 08/13/19      PT LONG TERM GOAL #3   Title Pt will be 100% compliant with HEP to self manage her symptoms      PT LONG TERM GOAL #4   Title NDI goal    Baseline TBD    Time 8    Period Weeks    Status New    Target Date 08/13/19                 Plan - 07/11/19 1258    Clinical Impression Statement In today's session we continued to focuse on chronic pain management by providing education of how not to be focused so much on MRI report or her impairments but instead focusing on how to manage her pain by how she can manager her pain by modifying her ADLs where she can still do her house chores but without increasing her pain. We emphasized importance of exercises even if they don't seem to help with short term pain improvement, they are helping her preserve motion and slowing regression. Patient educated on not over emphaszinging if her head is straight or not but instead trying to find more comfortable positions of her body which causes lesser pain and frequenty chaning position. Pt educated to continue to perform chin tucks when she turns her head or when she bends down and lifts something as it is helping her stabilize her C-spine and reducing  crepitus in her neck.    Personal Factors and Comorbidities Age;Past/Current Experience;Time since  onset of injury/illness/exacerbation;Comorbidity 2    Comorbidities brachial neuritis, migraines, anxiety, tremors    Examination-Activity Limitations Caring for Others;Carry;Lift;Sleep;Stand    Examination-Participation Restrictions Cleaning;Community Activity;Driving;Laundry;Meal Prep;Shop;Yard Work    Stability/Clinical Decision Making Evolving/Moderate complexity    Rehab Potential Good    PT Frequency 2x / week    PT Duration 8 weeks    PT Treatment/Interventions ADLs/Self Care Home Management;Moist Heat;Therapeutic activities;Therapeutic exercise;Neuromuscular re-education;Patient/family education;Manual techniques;Passive range of motion;Joint Manipulations    PT Next Visit Plan Light soft tissue only, limit manual therapy to 15 min, work on pain management strategies and pain neuroscience education    PT Home Exercise Plan Access Code: [email or web address]: [email or web address]: 06/15/2021Prepared by: Theone Murdoch PatelExercisesSupine Chin Tuck - 1 x daily - 7 x weekly - 1 sets - 10 repsSupine Deep Neck Flexor Training - Repetitions - 1 x daily - 7 x weekly - 1 sets - 10 reps           Patient will benefit from skilled therapeutic intervention in order to improve the following deficits and impairments:  Decreased activity tolerance, Decreased endurance, Decreased range of motion, Decreased strength, Pain, Impaired flexibility, Impaired tone, Postural dysfunction, Impaired sensation  Visit Diagnosis: Cervicalgia  Cervicogenic headache  Radiculopathy, cervical region     Problem List Patient Active Problem List   Diagnosis Date Noted  . Chronic pain 08/13/2017  . Left groin pain 08/05/2017  . Occipital neuralgia of right side 10/21/2016  . Chronic migraine 01/26/2015  . Cervicogenic headache 12/16/2014  . Subclinical hypothyroidism 12/16/2014  . Memory loss 10/08/2014  . Depression 10/08/2014  . Generalized anxiety disorder 10/08/2014  . Tremor 10/08/2014  . Major  depressive disorder, single episode, moderate (HCC) 08/27/2014  . Memory difficulties 08/27/2014  . Degenerative disc disease, cervical 08/27/2014  . Chronically on opiate therapy 08/27/2014    Ileana Ladd 07/11/2019, 1:09 PM  [facility] [address] [place], [place], [address] Phone: [phone]   Fax:  [phone]  Name: Shelanda Duvall MRN: [id number] Date of Birth: Oct 20, 1955

## 2019-07-15 ENCOUNTER — Ambulatory Visit: Payer: Medicare Other

## 2019-07-16 ENCOUNTER — Other Ambulatory Visit: Payer: Self-pay | Admitting: Internal Medicine

## 2019-07-19 ENCOUNTER — Other Ambulatory Visit: Payer: Self-pay

## 2019-07-19 ENCOUNTER — Ambulatory Visit: Payer: Medicare Other

## 2019-07-19 DIAGNOSIS — M542 Cervicalgia: Secondary | ICD-10-CM | POA: Diagnosis not present

## 2019-07-19 DIAGNOSIS — G4486 Cervicogenic headache: Secondary | ICD-10-CM

## 2019-07-19 DIAGNOSIS — M5412 Radiculopathy, cervical region: Secondary | ICD-10-CM

## 2019-07-19 NOTE — Therapy (Signed)
[facility] [address] [place], [place], [address] Phone: [phone]   Fax:  [phone]  Physical Therapy Treatment  Patient Details  Name: Toni [name] MRN: [id number] Date of Birth: Sep 06, 1955 Referring Provider (PT): Dr. Stephanie Acre   Encounter Date: 07/19/2019   PT End of Session - 07/19/19 1203    Visit Number 6    Number of Visits 17    Date for PT Re-Evaluation 08/13/19    Authorization Type UHC medicare 10 visit medicare guidelines    Progress Note Due on Visit 10    PT Start Time 1115    PT Stop Time 1145    PT Time Calculation (min) 30 min    Activity Tolerance Patient tolerated treatment well    Behavior During Therapy [facility] for tasks assessed/performed           Past Medical History:  Diagnosis Date  . Brachial neuritis or radiculitis NOS   . Chronic hepatitis C (HCC) 10/05/2015  . Depressive disorder, not elsewhere classified   . Essential and other specified forms of tremor   . Fracture of 5th metatarsal 08/13/2017  . Fracture of multiple pubic rami (HCC) 08/13/2017  . Headache(784.0)   . Hepatitis C infection 12/05/2017   Treated and now resolved and "cured " as of early 2018  And     . Memory loss   . Microscopic hematuria    seed dr Patsi Sears in past  . Migraine, unspecified, without mention of intractable migraine without mention of status migrainosus   . Multiple rib fractures 08/13/2017  . Other specified disorders of rotator cuff syndrome of shoulder and allied disorders   . Vulvar pain    rx with valium supp    Past Surgical History:  Procedure Laterality Date  . BREAST EXCISIONAL BIOPSY Right    No scar  . CESAREAN SECTION     x3  . SHOULDER ARTHROSCOPY W/ ROTATOR CUFF REPAIR Right 2004  . SHOULDER ARTHROSCOPY W/ ROTATOR CUFF REPAIR Left 2008    There were no vitals filed for this visit.   Subjective Assessment - 07/19/19 1200    Subjective Pt reports she  was on vacation. Her pain is worst right now, she had take 2 pain pills today instead of 1. She had to drag suitcases around which didn't help her pain.    Pertinent History brachial neuritis, migraines, anxiety, tremors    Limitations Sitting;Lifting;Standing;Walking;House hold activities;Other (comment)    How long can you sit comfortably? 10 min without readjusting    How long can you stand comfortably? 10 min but pain increases    How long can you walk comfortably? pt reports jolting pain in neck with stepping    Currently in Pain? Yes    Pain Score 7     Pain Location Neck                 Manual therapy: Very light soft tissue mobilization to bil middle and posterior scalenes, and upper trap, suboccipital region, levator scapule Passive cervical traction: light load only: 10 sec holds: 10x Passive cervical Rotations in supine: 20x R and L   Current HEP: chin tuck, chin tuck with head lift 06/27/2019 : work on supine chin tuck and cervical rotations- working on controlling range to prevent increase in pain, working on 15 min a time, few times throughout the day.  PT Short Term Goals - 06/18/19 1910      PT SHORT TERM GOAL #1   Title Patient will report <8/10 pain with functional activities to improve overall function at home    Baseline 10/10 (eval)    Time 4    Period Weeks    Status New    Target Date 07/16/19      PT SHORT TERM GOAL #2   Title Patient will dmeo 50% compliance with HEP to improve self management of her symptoms    Baseline not doing any exercises    Time 4    Period Weeks    Status New    Target Date 07/16/19      PT SHORT TERM GOAL #3   Title Patient will demo improved cervical rotation to 55 deg bil to improve ability to turn head without significant pain with driving    Baseline 45 deg rotation bil with pain of 9/10 at end range of motion (eval)    Time 4    Period Weeks    Status New     Target Date 07/16/19             PT Long Term Goals - 06/18/19 1911      PT LONG TERM GOAL #1   Title Patient will report pain decrease to 6 or better out of 10 with functional activities to improve overall function    Baseline 10/10    Time 8    Period Weeks    Status New    Target Date 08/13/19      PT LONG TERM GOAL #2   Title Patient will demo improved grip strength to 50lbs bil hands to improve functional grip strength with ADLs    Baseline ~43lbs bil    Time 8    Period Weeks    Status New    Target Date 08/13/19      PT LONG TERM GOAL #3   Title Pt will be 100% compliant with HEP to self manage her symptoms      PT LONG TERM GOAL #4   Title NDI goal    Baseline TBD    Time 8    Period Weeks    Status New    Target Date 08/13/19                 Plan - 07/19/19 1202    Clinical Impression Statement Today's session was focused on continued pain neuroscience education regarding tissue sensitivity and how to manage it with Active movements with exercises that doesn't increase her baseline pain and practicing this movement in supine, sitting, standing positions throughout the day. We worked on continued manual therapy to improve soft issue restrictions to reduce pain. Patient reporte soreness from manual therapy at end of the session. Pt didnt prefer hot or cold pack post session.    Personal Factors and Comorbidities Age;Past/Current Experience;Time since onset of injury/illness/exacerbation;Comorbidity 2    Comorbidities brachial neuritis, migraines, anxiety, tremors    Examination-Activity Limitations Caring for Others;Carry;Lift;Sleep;Stand    Examination-Participation Restrictions Cleaning;Community Activity;Driving;Laundry;Meal Prep;Shop;Yard Work    Stability/Clinical Decision Making Evolving/Moderate complexity    Rehab Potential Good    PT Frequency 2x / week    PT Duration 8 weeks    PT Treatment/Interventions ADLs/Self Care Home Management;Moist  Heat;Therapeutic activities;Therapeutic exercise;Neuromuscular re-education;Patient/family education;Manual techniques;Passive range of motion;Joint Manipulations    PT Next Visit Plan Light soft tissue only, limit manual therapy to 15 min, work on pain  management strategies and pain neuroscience education    PT Home Exercise Plan Access Code: [email or web address]: [email or web address]: 06/15/2021Prepared by: Theone Murdoch PatelExercisesSupine Chin Tuck - 1 x daily - 7 x weekly - 1 sets - 10 repsSupine Deep Neck Flexor Training - Repetitions - 1 x daily - 7 x weekly - 1 sets - 10 reps           Patient will benefit from skilled therapeutic intervention in order to improve the following deficits and impairments:  Decreased activity tolerance, Decreased endurance, Decreased range of motion, Decreased strength, Pain, Impaired flexibility, Impaired tone, Postural dysfunction, Impaired sensation  Visit Diagnosis: Cervicalgia  Cervicogenic headache  Radiculopathy, cervical region     Problem List Patient Active Problem List   Diagnosis Date Noted  . Chronic pain 08/13/2017  . Left groin pain 08/05/2017  . Occipital neuralgia of right side 10/21/2016  . Chronic migraine 01/26/2015  . Cervicogenic headache 12/16/2014  . Subclinical hypothyroidism 12/16/2014  . Memory loss 10/08/2014  . Depression 10/08/2014  . Generalized anxiety disorder 10/08/2014  . Tremor 10/08/2014  . Major depressive disorder, single episode, moderate (HCC) 08/27/2014  . Memory difficulties 08/27/2014  . Degenerative disc disease, cervical 08/27/2014  . Chronically on opiate therapy 08/27/2014    Ileana Ladd 07/19/2019, 12:05 PM  [facility] [address] [place], [place], [address] Phone: [phone]   Fax:  [phone]  Name: Toni [name] MRN: [id number] Date of Birth: [birth year]

## 2019-07-23 ENCOUNTER — Ambulatory Visit: Payer: Medicare Other

## 2019-07-23 ENCOUNTER — Other Ambulatory Visit: Payer: Self-pay

## 2019-07-23 DIAGNOSIS — M542 Cervicalgia: Secondary | ICD-10-CM

## 2019-07-23 DIAGNOSIS — M5412 Radiculopathy, cervical region: Secondary | ICD-10-CM

## 2019-07-23 DIAGNOSIS — G4486 Cervicogenic headache: Secondary | ICD-10-CM

## 2019-07-23 NOTE — Therapy (Signed)
[facility] [address] [place], [place], [address] Phone: [phone]   Fax:  [phone]  Physical Therapy Treatment  Patient Details  Name: Stephnie Parlier MRN: [id number] Date of Birth: 05/03/1955 Referring Provider (PT): Dr. Stephanie Acre   Encounter Date: 07/23/2019   PT End of Session - 07/23/19 1430    Visit Number 7    Number of Visits 17    Date for PT Re-Evaluation 08/13/19    Authorization Type UHC medicare 10 visit medicare guidelines    Progress Note Due on Visit 10    PT Start Time 1330    PT Stop Time 1415    PT Time Calculation (min) 45 min    Activity Tolerance Patient tolerated treatment well    Behavior During Therapy [facility] for tasks assessed/performed           Past Medical History:  Diagnosis Date  . Brachial neuritis or radiculitis NOS   . Chronic hepatitis C (HCC) 10/05/2015  . Depressive disorder, not elsewhere classified   . Essential and other specified forms of tremor   . Fracture of 5th metatarsal 08/13/2017  . Fracture of multiple pubic rami (HCC) 08/13/2017  . Headache(784.0)   . Hepatitis C infection 12/05/2017   Treated and now resolved and "cured " as of early 2018  And     . Memory loss   . Microscopic hematuria    seed dr Patsi Sears in past  . Migraine, unspecified, without mention of intractable migraine without mention of status migrainosus   . Multiple rib fractures 08/13/2017  . Other specified disorders of rotator cuff syndrome of shoulder and allied disorders   . Vulvar pain    rx with valium supp    Past Surgical History:  Procedure Laterality Date  . BREAST EXCISIONAL BIOPSY Right    No scar  . CESAREAN SECTION     x3  . SHOULDER ARTHROSCOPY W/ ROTATOR CUFF REPAIR Right 2004  . SHOULDER ARTHROSCOPY W/ ROTATOR CUFF REPAIR Left 2008    There were no vitals filed for this visit.   Subjective Assessment - 07/23/19 1429    Subjective Pt reports I  can lift my head little better. I have made an appt to see neurosurgeon to get neck evaluated.    Pertinent History brachial neuritis, migraines, anxiety, tremors    Limitations Sitting;Lifting;Standing;Walking;House hold activities;Other (comment)    How long can you sit comfortably? 10 min without readjusting    How long can you stand comfortably? 10 min but pain increases    How long can you walk comfortably? pt reports jolting pain in neck with stepping                   Manual therapy: Very light soft tissue mobilization to bil middle and posterior scalenes, and upper trap, suboccipital region, levator scapule Movement with mobiliation: unilateral PA mob grade I with contralateral cervical rotations: 10x R and L  TherEx Supine chin tuck with head lift: AA 10x Supine isometric cervical rotations: 10x Supine cervical rotations: 10x  Hot pack to c-spine for 10' post session   Current HEP: chin tuck, chin tuck with head lift 06/27/2019 : work on supine chin tuck and cervical rotations- working on controlling range to prevent increase in pain, working on 15 min a time, few times throughout the day.  PT Short Term Goals - 06/18/19 1910      PT SHORT TERM GOAL #1   Title Patient will report <8/10 pain with functional activities to improve overall function at home    Baseline 10/10 (eval)    Time 4    Period Weeks    Status New    Target Date 07/16/19      PT SHORT TERM GOAL #2   Title Patient will dmeo 50% compliance with HEP to improve self management of her symptoms    Baseline not doing any exercises    Time 4    Period Weeks    Status New    Target Date 07/16/19      PT SHORT TERM GOAL #3   Title Patient will demo improved cervical rotation to 55 deg bil to improve ability to turn head without significant pain with driving    Baseline 45 deg rotation bil with pain of 9/10 at end range of motion (eval)    Time 4     Period Weeks    Status New    Target Date 07/16/19             PT Long Term Goals - 06/18/19 1911      PT LONG TERM GOAL #1   Title Patient will report pain decrease to 6 or better out of 10 with functional activities to improve overall function    Baseline 10/10    Time 8    Period Weeks    Status New    Target Date 08/13/19      PT LONG TERM GOAL #2   Title Patient will demo improved grip strength to 50lbs bil hands to improve functional grip strength with ADLs    Baseline ~43lbs bil    Time 8    Period Weeks    Status New    Target Date 08/13/19      PT LONG TERM GOAL #3   Title Pt will be 100% compliant with HEP to self manage her symptoms      PT LONG TERM GOAL #4   Title NDI goal    Baseline TBD    Time 8    Period Weeks    Status New    Target Date 08/13/19                  Patient will benefit from skilled therapeutic intervention in order to improve the following deficits and impairments:     Visit Diagnosis: Cervicalgia  Cervicogenic headache  Radiculopathy, cervical region     Problem List Patient Active Problem List   Diagnosis Date Noted  . Chronic pain 08/13/2017  . Left groin pain 08/05/2017  . Occipital neuralgia of right side 10/21/2016  . Chronic migraine 01/26/2015  . Cervicogenic headache 12/16/2014  . Subclinical hypothyroidism 12/16/2014  . Memory loss 10/08/2014  . Depression 10/08/2014  . Generalized anxiety disorder 10/08/2014  . Tremor 10/08/2014  . Major depressive disorder, single episode, moderate (HCC) 08/27/2014  . Memory difficulties 08/27/2014  . Degenerative disc disease, cervical 08/27/2014  . Chronically on opiate therapy 08/27/2014    Ileana Ladd 07/23/2019, 2:36 PM  [facility] [address] [place], [place], [address] Phone: [phone]   Fax:  [phone]  Name: Jeffery Gammell MRN: [id number] Date of Birth:  [birth year]

## 2019-07-25 ENCOUNTER — Ambulatory Visit: Payer: Medicare Other

## 2019-07-30 ENCOUNTER — Ambulatory Visit
Admission: RE | Admit: 2019-07-30 | Discharge: 2019-07-30 | Disposition: A | Discharge status: Home / Self Care | Payer: Medicare Other | Source: Ambulatory Visit | Attending: Internal Medicine | Admitting: Internal Medicine

## 2019-07-30 ENCOUNTER — Other Ambulatory Visit: Payer: Self-pay

## 2019-07-30 ENCOUNTER — Ambulatory Visit: Payer: Medicare Other

## 2019-07-30 DIAGNOSIS — G4486 Cervicogenic headache: Secondary | ICD-10-CM

## 2019-07-30 DIAGNOSIS — M8580 Other specified disorders of bone density and structure, unspecified site: Secondary | ICD-10-CM

## 2019-07-30 DIAGNOSIS — M542 Cervicalgia: Secondary | ICD-10-CM

## 2019-07-30 DIAGNOSIS — M5412 Radiculopathy, cervical region: Secondary | ICD-10-CM

## 2019-07-30 DIAGNOSIS — Z1231 Encounter for screening mammogram for malignant neoplasm of breast: Secondary | ICD-10-CM

## 2019-07-30 DIAGNOSIS — E2839 Other primary ovarian failure: Secondary | ICD-10-CM

## 2019-07-30 NOTE — Therapy (Signed)
[facility] [address] [place], [place], [address] Phone: [phone]   Fax:  [phone]  Physical Therapy Therapy Recertification  Patient Details  Name: Toni [name] MRN: [id number] Date of Birth: 1955-06-18 Referring Provider (PT): Dr. Margette Fast   Encounter Date: 07/30/2019   PT End of Session - 07/30/19 1209    Visit Number 8    Number of Visits 17    Date for PT Re-Evaluation 08/13/19    Authorization Type [employer] medicare 10 visit medicare guidelines    Progress Note Due on Visit 10    PT Start Time 1020    PT Stop Time 1105    PT Time Calculation (min) 45 min    Activity Tolerance Patient tolerated treatment well    Behavior During Therapy [facility] for tasks assessed/performed           Past Medical History:  Diagnosis Date  . Brachial neuritis or radiculitis NOS   . Chronic hepatitis C ([place]) 10/05/2015  . Depressive disorder, not elsewhere classified   . Essential and other specified forms of tremor   . Fracture of 5th metatarsal 08/13/2017  . Fracture of multiple pubic rami ([place]) 08/13/2017  . Headache(784.0)   . Hepatitis C infection 12/05/2017   Treated and now resolved and "cured " as of early 2018  And     . Memory loss   . Microscopic hematuria    seed dr Gaynelle Arabian in past  . Migraine, unspecified, without mention of intractable migraine without mention of status migrainosus   . Multiple rib fractures 08/13/2017  . Other specified disorders of rotator cuff syndrome of shoulder and allied disorders   . Vulvar pain    rx with valium supp    Past Surgical History:  Procedure Laterality Date  . BREAST EXCISIONAL BIOPSY Right    No scar  . CESAREAN SECTION     x3  . SHOULDER ARTHROSCOPY W/ ROTATOR CUFF REPAIR Right 2004  . SHOULDER ARTHROSCOPY W/ ROTATOR CUFF REPAIR Left 2008    There were no vitals filed for this visit.   Subjective Assessment - 07/30/19 1103    Subjective  Pt reports I can lift my head little better. I have made an appt to see neurosurgeon to get neck evaluated.    Pertinent History brachial neuritis, migraines, anxiety, tremors    Limitations Sitting;Lifting;Standing;Walking;House hold activities;Other (comment)    How long can you sit comfortably? 10 min without readjusting    How long can you stand comfortably? 10 min but pain increases    How long can you walk comfortably? pt reports jolting pain in neck with stepping              [facility] PT Assessment - 07/30/19 1056      AROM   Cervical Flexion 50    Cervical Extension 60    Cervical - Right Rotation 50    Cervical - Left Rotation 50                     Manual therapy: Very light soft tissue mobilization to bil middle and posterior scalenes, and upper trap, suboccipital region, levator scapule   TherEx Supine chin tuck with head lift:  10x Supine isometric cervical rotations: 10x 10 sec holds, pain with isometric R rotation Supine cervical rotations: 20x in 25 deg rotation  Hot pack to c-spine for 10' post session   Current HEP: chin tuck, chin tuck with head lift  06/27/2019 : work on supine chin tuck and cervical rotations- working on controlling range to prevent increase in pain, working on 15 min a time, few times throughout the day.                             PT Short Term Goals - 07/30/19 1109      PT SHORT TERM GOAL #1   Title Patient will report <8/10 pain with functional activities to improve overall function at home    Baseline 10/10 (eval); pain fluctuates 7-9/10 (07/30/19)    Time 4    Period Weeks    Status Partially Met    Target Date 07/16/19      PT SHORT TERM GOAL #2   Title Patient will dmeo 50% compliance with HEP to improve self management of her symptoms    Baseline not doing any exercises; 25% compliance with exercises (07/30/19)    Time 4    Period Weeks    Status Partially Met    Target Date 07/16/19       PT SHORT TERM GOAL #3   Title Patient will demo improved cervical rotation to 55 deg bil to improve ability to turn head without significant pain with driving    Baseline 45 deg rotation bil with pain of 9/10 at end range of motion (eval); 50 deg rotation bil with pain of 9/10 at end range    Time 4    Period Weeks    Status Partially Met    Target Date 07/16/19             PT Long Term Goals - 06/18/19 1911      PT LONG TERM GOAL #1   Title Patient will report pain decrease to 6 or better out of 10 with functional activities to improve overall function    Baseline 10/10    Time 8    Period Weeks    Status New    Target Date 08/13/19      PT LONG TERM GOAL #2   Title Patient will demo improved grip strength to 50lbs bil hands to improve functional grip strength with ADLs    Baseline ~43lbs bil    Time 8    Period Weeks    Status New    Target Date 08/13/19      PT LONG TERM GOAL #3   Title Pt will be 100% compliant with HEP to self manage her symptoms      PT LONG TERM GOAL #4   Title NDI goal    Baseline TBD    Time 8    Period Weeks    Status New    Target Date 08/13/19                 Plan - 07/30/19 1111    Clinical Impression Statement Patient has been seen for total of 8 sessions for chronic neck pain. Patient has demonstrated significant improvement in her cervical AROM since her evaluation but she still has significant level of pain, 9/10, at end ranges of motion. Patient demonstrates excessive guarding and muscle spasms in her neck. Patient reports that she still has constant pain that ranges from 7-9/10. Patient has fair+ compliance with exercise program and is improving. Patient may benefit from trial of dry needling to address soft tissue tightness and pain to improve overall function. Patient is making slow and gradual progress towards her functional gaols.  Personal Factors and Comorbidities Age;Past/Current Experience;Time since onset of  injury/illness/exacerbation;Comorbidity 2    Comorbidities brachial neuritis, migraines, anxiety, tremors    Examination-Activity Limitations Caring for Others;Carry;Lift;Sleep;Stand    Examination-Participation Restrictions Cleaning;Community Activity;Driving;Laundry;Meal Prep;Shop;Yard Work    Stability/Clinical Decision Making Evolving/Moderate complexity    Clinical Decision Making Moderate    Rehab Potential Good    PT Frequency 2x / week    PT Duration 8 weeks    PT Treatment/Interventions ADLs/Self Care Home Management;Moist Heat;Therapeutic activities;Therapeutic exercise;Neuromuscular re-education;Patient/family education;Manual techniques;Passive range of motion;Joint Manipulations;Other (comment)   Dry needling          Patient will benefit from skilled therapeutic intervention in order to improve the following deficits and impairments:  Decreased activity tolerance, Decreased endurance, Decreased range of motion, Decreased strength, Pain, Impaired flexibility, Impaired tone, Postural dysfunction, Impaired sensation  Visit Diagnosis: Cervicalgia  Cervicogenic headache  Radiculopathy, cervical region     Problem List Patient Active Problem List   Diagnosis Date Noted  . Chronic pain 08/13/2017  . Left groin pain 08/05/2017  . Occipital neuralgia of right side 10/21/2016  . Chronic migraine 01/26/2015  . Cervicogenic headache 12/16/2014  . Subclinical hypothyroidism 12/16/2014  . Memory loss 10/08/2014  . Depression 10/08/2014  . Generalized anxiety disorder 10/08/2014  . Tremor 10/08/2014  . Major depressive disorder, single episode, moderate ([place]) 08/27/2014  . Memory difficulties 08/27/2014  . Degenerative disc disease, cervical 08/27/2014  . Chronically on opiate therapy 08/27/2014    Kerrie Pleasure 07/30/2019, 12:10 PM  [facility] [address] [place], [place], [address] Phone: [phone]    Fax:  [phone]  Name: Toni [name] MRN: [id number] Date of Birth: [birth year]

## 2019-08-01 NOTE — Progress Notes (Signed)
Bone density shows osteopenia  .   Advise  weight bearing exercise as much possible  and adequat vit d and calicum in diet .   See report.  Recheck dexa in 3 years

## 2019-08-12 ENCOUNTER — Ambulatory Visit: Payer: Medicare Other

## 2019-08-14 ENCOUNTER — Ambulatory Visit: Payer: Medicare Other

## 2019-08-15 ENCOUNTER — Ambulatory Visit: Payer: Medicare Other | Admitting: Physical Therapy

## 2019-08-16 ENCOUNTER — Ambulatory Visit: Payer: Medicare Other

## 2019-08-19 ENCOUNTER — Ambulatory Visit: Payer: Medicare Other

## 2019-08-22 ENCOUNTER — Ambulatory Visit: Payer: Medicare Other

## 2019-08-23 ENCOUNTER — Other Ambulatory Visit: Payer: Self-pay | Admitting: Neurosurgery

## 2019-08-23 DIAGNOSIS — M5412 Radiculopathy, cervical region: Secondary | ICD-10-CM

## 2019-08-26 ENCOUNTER — Ambulatory Visit: Payer: Medicare Other | Admitting: Physical Therapy

## 2019-08-27 ENCOUNTER — Ambulatory Visit: Payer: Medicare Other

## 2019-08-30 ENCOUNTER — Other Ambulatory Visit: Payer: Self-pay | Admitting: Internal Medicine

## 2019-08-30 ENCOUNTER — Ambulatory Visit: Payer: Medicare Other | Admitting: Physical Therapy

## 2019-08-30 ENCOUNTER — Ambulatory Visit: Payer: Medicare Other

## 2019-09-02 ENCOUNTER — Ambulatory Visit: Payer: Medicare Other

## 2019-09-05 IMAGING — CT CT HIP*L* W/O CM
2 of 3 series · 16 of 46 positions shown, 18 images · non-contrast
Comparison: Left hip radiographs 08/04/2017.

CLINICAL DATA: Left groin pain with limited weight-bearing since
falling 2 days ago.

EXAM:
CT OF THE LEFT HIP WITHOUT CONTRAST
TECHNIQUE: Multidetector CT imaging of the left hip was performed according to
the standard protocol. Multiplanar CT image reconstructions were
also generated.

[Series 4: lower ext 1.5 st · axial · 0.37mm/px · z∈[+788,+918]mm · 13 of 101 slices shown, 15 images]
[im 7/101  soft-tissue]
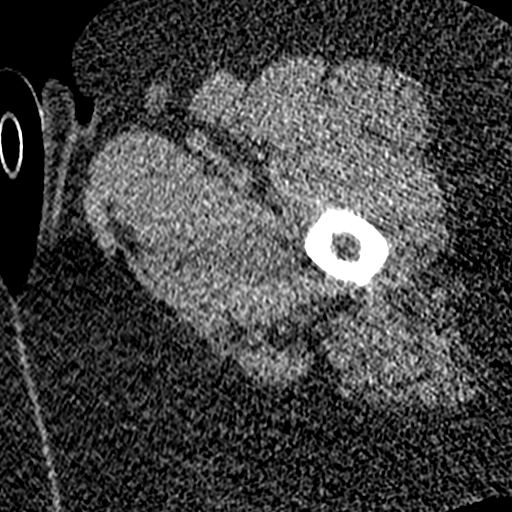
[im 7/101  bone]
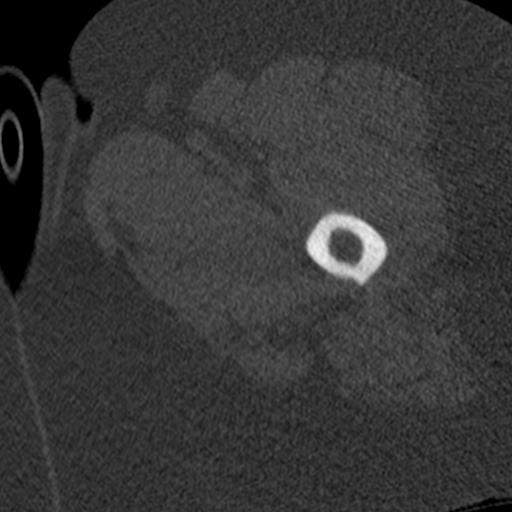
[im 13/101  soft-tissue]
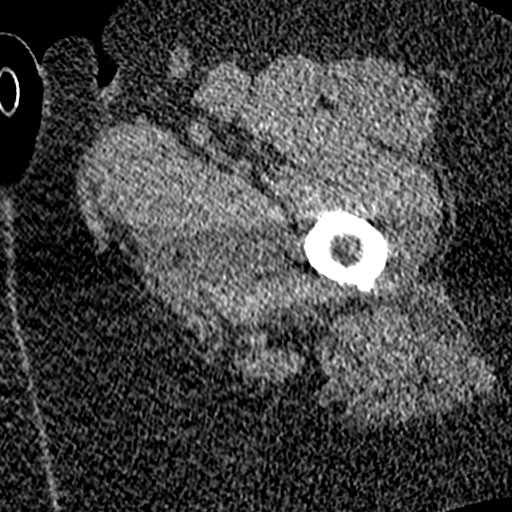
[im 20/101  soft-tissue]
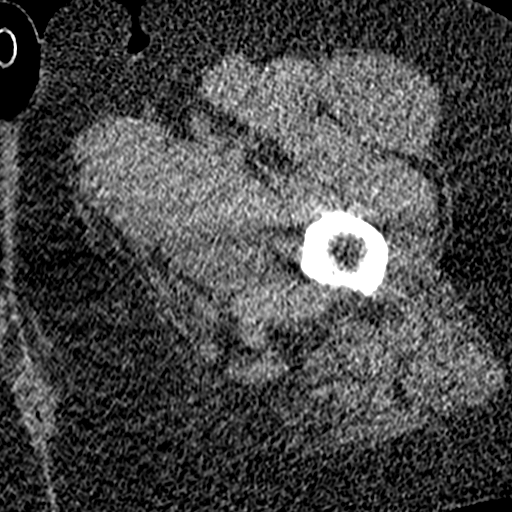
[im 30/101  soft-tissue]
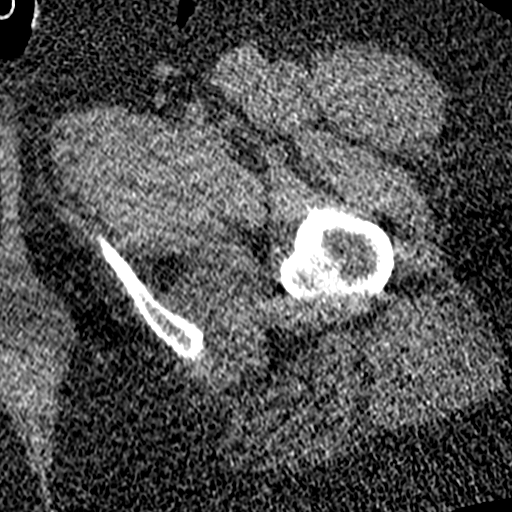
[im 36/101  soft-tissue]
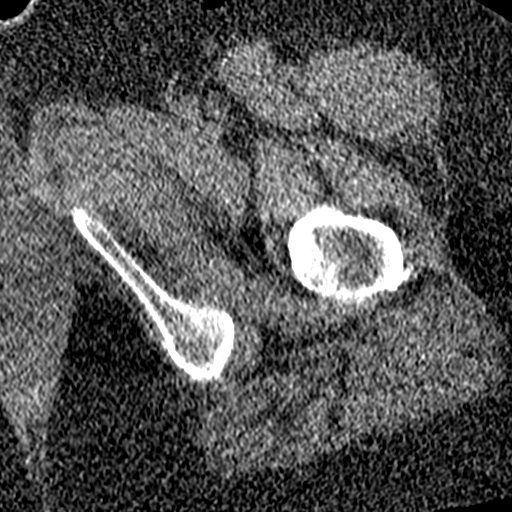
[im 42/101  soft-tissue]
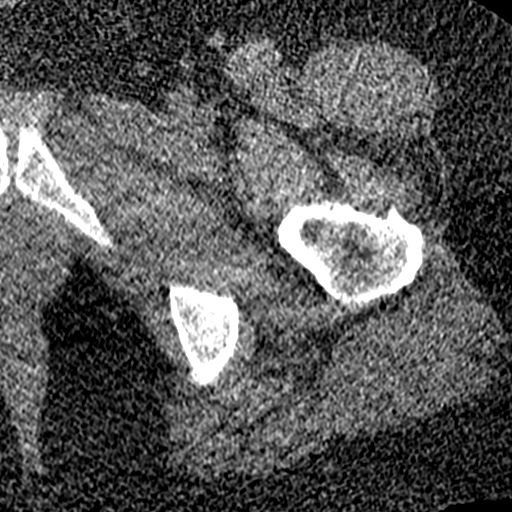
[im 52/101  soft-tissue]
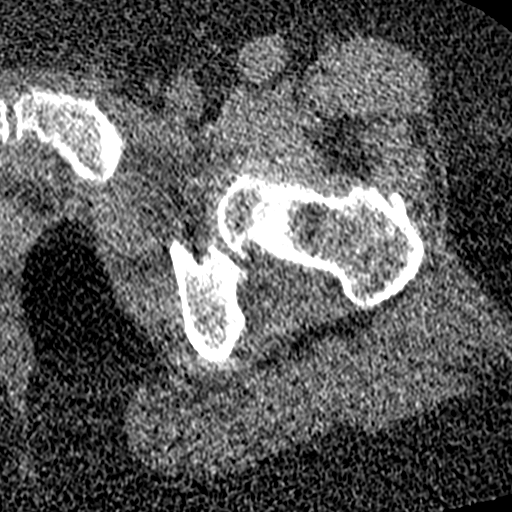
[im 59/101  soft-tissue]
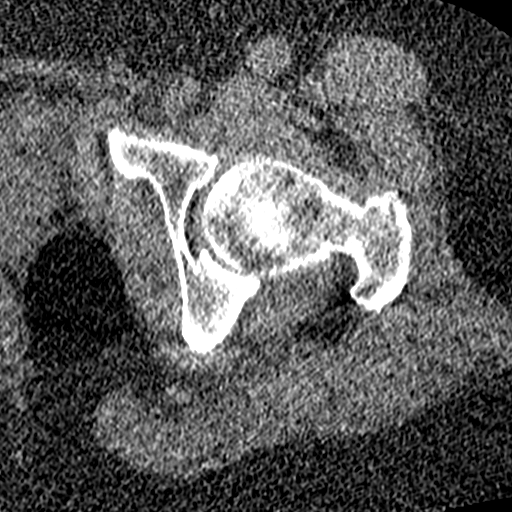
[im 65/101  soft-tissue]
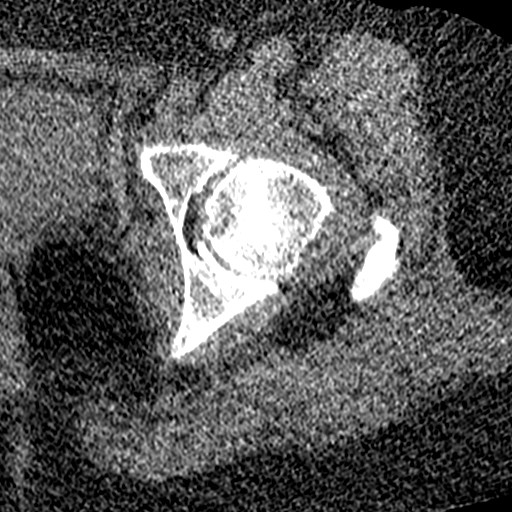
[im 65/101  bone]
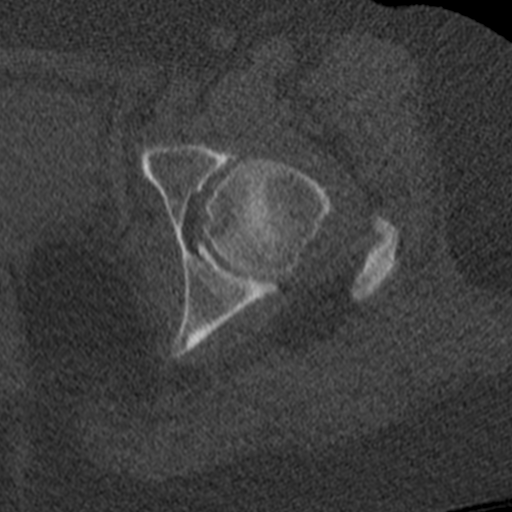
[im 71/101  soft-tissue]
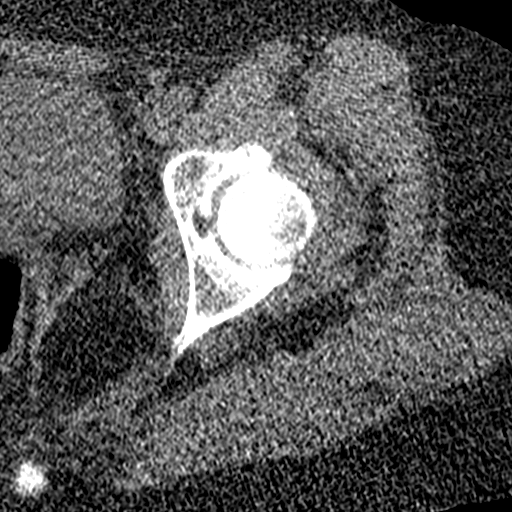
[im 81/101  soft-tissue]
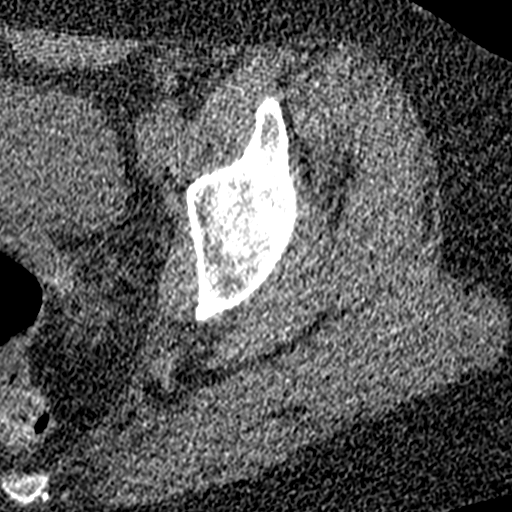
[im 88/101  soft-tissue]
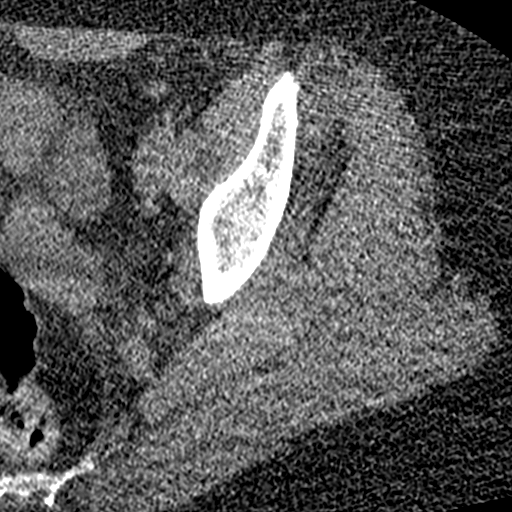
[im 94/101  soft-tissue]
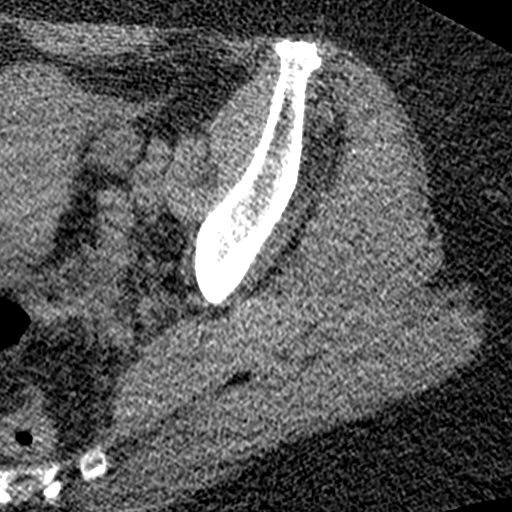

[Series 10: lower ext cor st · coronal · 0.29mm/px · 3 of 156 slices shown]
[im 52/156  soft-tissue]
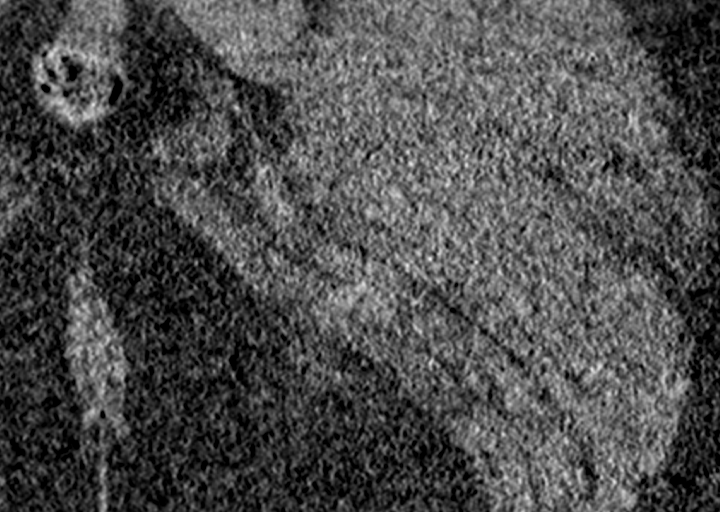
[im 69/156  soft-tissue]
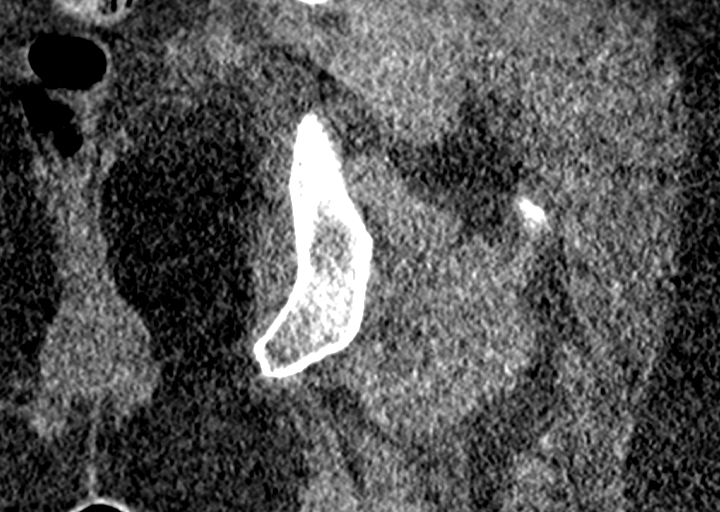
[im 87/156  soft-tissue]
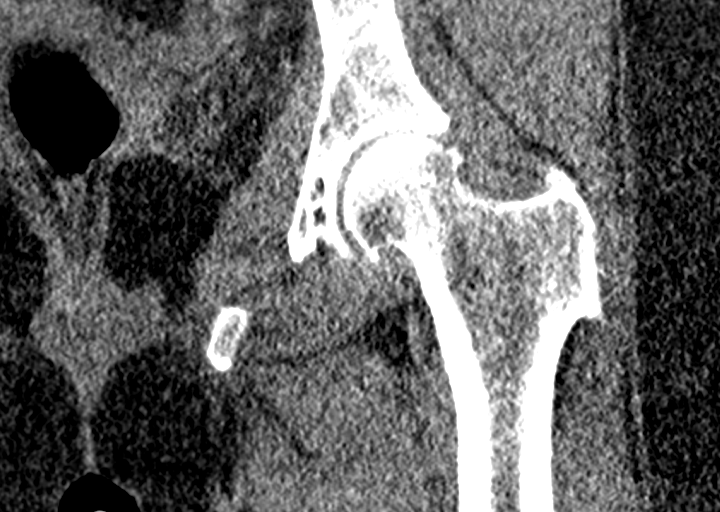

[16 of 46 positions shown; findings below may reference images not displayed]

FINDINGS: Bones/Joint/Cartilage

Examination is limited to the left hip and inferior left hemipelvis.
Image quality is suboptimal, in part secondary to body habitus. No
acute fracture or dislocation is demonstrated. There are mild left
hip degenerative changes with osteophytes of the femoral neck and
subchondral cyst formation anteriorly in the acetabulum. No definite
abnormality of the left superior pubic ramus as questioned on prior
radiographs. No significant hip joint effusion. The sacroiliac
joints are not imaged.

Ligaments

Not relevant for exam/indication.

Muscles and Tendons

Unremarkable.

Soft tissues

No focal hematoma or other fluid collection demonstrated.
IMPRESSION: 1. No evidence of acute fracture or dislocation at the left hip.
Previously questioned fracture of the left superior pubic ramus
demonstrates no definite CT correlate. Image quality is degraded by
body habitus.
2. Left hip degenerative changes.  No significant joint effusion.
3. No periarticular hematoma.

## 2019-09-17 ENCOUNTER — Other Ambulatory Visit: Payer: Medicare Other

## 2019-09-17 ENCOUNTER — Other Ambulatory Visit: Payer: Self-pay

## 2019-09-17 ENCOUNTER — Ambulatory Visit
Admission: RE | Admit: 2019-09-17 | Discharge: 2019-09-17 | Disposition: A | Discharge status: Home / Self Care | Payer: Medicare Other | Source: Ambulatory Visit | Attending: Neurosurgery | Admitting: Neurosurgery

## 2019-09-17 DIAGNOSIS — M5412 Radiculopathy, cervical region: Secondary | ICD-10-CM

## 2019-09-20 ENCOUNTER — Other Ambulatory Visit: Payer: Medicare Other

## 2019-11-14 ENCOUNTER — Ambulatory Visit: Payer: Medicare Other | Admitting: Neurology

## 2019-11-18 ENCOUNTER — Telehealth: Payer: Self-pay | Admitting: Internal Medicine

## 2019-11-18 ENCOUNTER — Other Ambulatory Visit: Payer: Self-pay

## 2019-11-18 ENCOUNTER — Ambulatory Visit: Payer: Medicare Other | Admitting: Internal Medicine

## 2019-11-18 ENCOUNTER — Encounter: Payer: Self-pay | Admitting: Internal Medicine

## 2019-11-18 VITALS — BP 134/76 | HR 88 | Temp 98.0°F | Ht 61.5 in | Wt 192.0 lb

## 2019-11-18 DIAGNOSIS — Z79899 Other long term (current) drug therapy: Secondary | ICD-10-CM

## 2019-11-18 DIAGNOSIS — E038 Other specified hypothyroidism: Secondary | ICD-10-CM

## 2019-11-18 DIAGNOSIS — I10 Essential (primary) hypertension: Secondary | ICD-10-CM | POA: Diagnosis not present

## 2019-11-18 DIAGNOSIS — M5481 Occipital neuralgia: Secondary | ICD-10-CM

## 2019-11-18 DIAGNOSIS — R519 Headache, unspecified: Secondary | ICD-10-CM

## 2019-11-18 DIAGNOSIS — E785 Hyperlipidemia, unspecified: Secondary | ICD-10-CM | POA: Diagnosis not present

## 2019-11-18 DIAGNOSIS — G8929 Other chronic pain: Secondary | ICD-10-CM

## 2019-11-18 MED ORDER — KETOCONAZOLE 2 % EX CREA: TOPICAL_CREAM | CUTANEOUS | 2 refills | Status: DC

## 2019-11-18 NOTE — Patient Instructions (Addendum)
Plan  Lab in [facility] then  cpx   Or yearly check  No change in  meds .  BP is  Ok today    But prefer you get back on Medicine .  Stay on same  Thyroid   Dose.

## 2019-11-18 NOTE — Telephone Encounter (Signed)
Pt asking when she is due for pneumonia vaccine. Please advise. Pt ph [phone]

## 2019-11-18 NOTE — Progress Notes (Signed)
Chief Complaint  Patient presents with  . Follow-up    discuss med management, bp meds ets    HPI: Toni [name] 64 y.o. come in for Chronic disease/medical management    Since last visit her stress has continued family issues. Had rehab for her neck pain and that seemed to have make it worse. Followed dr Anne Hahn neurology plan is injections that hopefully will help her head pain the symptoms occurred after injury. States memory is doing  Better and good at this time. Last pv 5 21   BP   not checking readings or taking medicine perhaps month    Pain neck and head   No help but  Placed on  Gabapentin     Dr [name]   Nothing changed .  Then  horizen er   .  Trying to decrease   Meds.  Overall pill burden.  Taking thyroid  Regular.  HH o Normally 2 .   Sleep    0 - 21 hours  7 yo grand child is doing well in school    ROS: See pertinent positives and negatives per HPI. No current cp sob changes   Past Medical History:  Diagnosis Date  . Brachial neuritis or radiculitis NOS   . Chronic hepatitis C ([facility]) 10/05/2015  . Depressive disorder, not elsewhere classified   . Essential and other specified forms of tremor   . Fracture of 5th metatarsal 08/13/2017  . Fracture of multiple pubic rami ([facility]) 08/13/2017  . Headache(784.0)   . Hepatitis C infection 12/05/2017   Treated and now resolved and "cured " as of early 05-13-2016  And     . Memory loss   . Microscopic hematuria    seed dr Patsi Sears in past  . Migraine, unspecified, without mention of intractable migraine without mention of status migrainosus   . Multiple rib fractures 08/13/2017  . Other specified disorders of rotator cuff syndrome of shoulder and allied disorders   . Vulvar pain    rx with valium supp    Family History  Problem Relation Age of Onset  . Headache Mother   . Stroke Father        father passed away june17  . Diabetes Maternal Grandmother   . Other Son        Mother is deceased 14-May-2011 father  moved to the area 05/13/2012.    Social History   Socioeconomic History  . Marital status: Divorced    Spouse name: Not on file  . Number of children: 3  . Years of education: Not on file  . Highest education level: Not on file  Occupational History  . Not on file  Tobacco Use  . Smoking status: Former Smoker    Types: Cigarettes    Quit date: 05/23/1978    Years since quitting: 41.5  . Smokeless tobacco: Never Used  Vaping Use  . Vaping Use: Never used  Substance and Sexual Activity  . Alcohol use: No    Alcohol/week: 0.0 standard drinks  . Drug use: No  . Sexual activity: Not on file  Other Topics Concern  . 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 On disability for depression and short-term memory problems. Dr. love.   She is gravida 3 para 3   Father passed away  07-14-2022  17   Social Determinants of Health   Financial Resource Strain:   . Difficulty of Paying Living Expenses: Not on file  Food Insecurity:   . Worried About Programme researcher, broadcasting/film/video in the Last Year: Not on file  . Ran Out of Food in the Last Year: Not on file  Transportation Needs:   . Lack of Transportation (Medical): Not on file  . Lack of Transportation (Non-Medical): Not on file  Physical Activity:   . Days of Exercise per Week: Not on file  . Minutes of Exercise per Session: Not on file  Stress:   . Feeling of Stress : Not on file  Social Connections:   . Frequency of Communication with Friends and Family: Not on file  . Frequency of Social Gatherings with Friends and Family: Not on file  . Attends Religious Services: Not on file  . Active Member of Clubs or Organizations: Not on file  . Attends Banker Meetings: Not on file  . Marital Status: Not on file    Outpatient Medications Prior to Visit  Medication Sig Dispense Refill  . albuterol (PROVENTIL  HFA;VENTOLIN HFA) 108 (90 Base) MCG/ACT inhaler Inhale 2 puffs into the lungs every 6 (six) hours as needed for wheezing or shortness of breath. 1 Inhaler 0  . ALPRAZolam (XANAX) 1 MG tablet Take 1 tablet (1 mg total) by mouth 4 (four) times daily as needed for anxiety (shakes). 5 tablet 0  . Alum Hydroxide-Mag Trisilicate (GAVISCON) 80-14.2 MG CHEW Chew by mouth. CHEW 2 TABLETS BY MOUTH BEFORE EACH MEAL AT BEDTIME AS NEEDED FOR INDIGESTION OR HEARTBURN    . amLODipine (NORVASC) 2.5 MG tablet TAKE ONE TABLET BY MOUTH DAILY 30 tablet 2  . cetirizine (ZYRTEC) 10 MG tablet Take 10 mg by mouth daily as needed for allergies.    Marland Kitchen desvenlafaxine (PRISTIQ) 50 MG 24 hr tablet Take 50 mg by mouth daily.    Marland Kitchen dextroamphetamine (DEXEDRINE SPANSULE) 15 MG 24 hr capsule Take 30-45 mg by mouth daily. 2 po q am and 1 po q noon    . FLUoxetine (PROZAC) 20 MG capsule Take 1 capsule by mouth daily.  0  . gabapentin (NEURONTIN) 600 MG tablet Take 600 mg by mouth. PO TID FOR HEAD PAIN    . HYDROcodone-ibuprofen (VICOPROFEN) 7.5-200 MG tablet every 6 (six) hours as needed.     Marland Kitchen levothyroxine (SYNTHROID) 25 MCG tablet TAKE 1 TABLET (25 MCG TOTAL) BY MOUTH DAILY BEFORE BREAKFAST. TAKE ON AN EMPTY STOMACH 90 tablet 1  . Misc Natural Products (ESTROVEN + ENERGY MAX STRENGTH) TABS Take by mouth daily.    . Multiple Vitamins-Minerals (CENTRUM SILVER ADULT 50+) TABS Take 1 tablet by mouth daily.    . SUMAtriptan (IMITREX) 25 MG tablet Take 1 tablet (25 mg total) by mouth as needed for migraine. May repeat in 2 hours if headache persists or recurs. 10 tablet 11  . SUMAtriptan 6 MG/0.5ML SOAJ Use 1 syringe as needed for migraines upon awakening. Do not use with imitrex tablet together. 0.5 mL 6  . ketoconazole (NIZORAL) 2 % cream APPLY TO AFFECTED AREA TWICE A DAY (Patient taking differently: as needed. APPLY TO AFFECTED AREA TWICE A DAY) 30 g 2   No facility-administered medications prior to visit.     EXAM:  BP 134/76    Pulse 88   Temp 98 F (36.7 C) (Oral)   Ht 5' 1.5" (1.562 m)   Wt 192 lb (87.1 kg)  SpO2 98%   BMI 35.69 kg/m   Body mass index is 35.69 kg/m.  GENERAL: vitals reviewed and listed above, alert, oriented, appears well hydrated and in no acute distress HEENT: atraumatic, conjunctiva  clear, no obvious abnormalities on inspection of external nose and ears OP : masked   Somewhat stiff   Neck but  Nl gait  NECK: no obvious masses on inspection palpation  LUNGS: clear to auscultation bilaterally, no wheezes, rales or rhonchi, good air movement CV: HRRR, no clubbing cyanosis or  peripheral edema nl cap refill  MS: moves all extremities without noticeable focal  abnormality PSYCH: verbal and cooperative,  Lab Results  Component Value Date   WBC 5.1 05/31/2019   HGB 12.7 05/31/2019   HCT 38.6 05/31/2019   PLT 250.0 05/31/2019   GLUCOSE 89 04/22/2019   CHOL 228 (H) 05/31/2019   TRIG 90.0 05/31/2019   HDL 46.20 05/31/2019   LDLCALC 164 (H) 05/31/2019   ALT 22 05/31/2019   AST 30 05/31/2019   NA 144 04/22/2019   K 4.8 04/22/2019   CL 104 04/22/2019   CREATININE 0.92 04/22/2019   BUN 33 (H) 04/22/2019   CO2 25 04/22/2019   TSH 4.16 05/31/2019   HGBA1C 5.8 02/28/2017   BP Readings from Last 3 Encounters:  11/18/19 134/76  05/15/19 130/78  04/18/19 124/81    ASSESSMENT AND PLAN:  Discussed the following assessment and plan:  Subclinical hypothyroidism - Plan: TSH, Hepatic function panel, Lipid panel, BASIC METABOLIC PANEL WITH GFR, CBC with Differential/Platelet  Hyperlipidemia, unspecified hyperlipidemia type - Plan: TSH, Hepatic function panel, Lipid panel, BASIC METABOLIC PANEL WITH GFR, CBC with Differential/Platelet  Medication management - Plan: TSH, Hepatic function panel, Lipid panel, BASIC METABOLIC PANEL WITH GFR, CBC with Differential/Platelet  Essential hypertension - Plan: TSH, Hepatic function panel, Lipid panel, BASIC METABOLIC PANEL WITH GFR, CBC with  Differential/Platelet  Occipital neuralgia of right side - Plan: TSH, Hepatic function panel, Lipid panel, BASIC METABOLIC PANEL WITH GFR, CBC with Differential/Platelet  Chronic nonintractable headache, unspecified headache type - Plan: TSH, Hepatic function panel, Lipid panel, BASIC METABOLIC PANEL WITH GFR, CBC with Differential/Platelet utd vaccine   Med monitoring   revewied     Plan  At this time no change   But prefer using medication  Will plan labs monitoring an next  Visit   She  will continue care under neuro and  Psych    -Patient advised to return or notify health care team  if  new concerns arise. Review counsel and plan   32 minutes  Patient Instructions  Plan  Lab in [facility] then  cpx   Or yearly check  No change in  meds .  BP is  Ok today    But prefer you get back on Medicine .  Stay on same  Thyroid   Dose.    Neta Mends. Daesean Lazarz M.D.

## 2019-11-20 NOTE — Telephone Encounter (Signed)
Patient last pneumonia injection was 05-15-2019. Please advise this

## 2019-11-20 NOTE — Telephone Encounter (Signed)
May  2022  Due  Pneumovax 23  ( had prevnar13 may 2021 )

## 2019-11-21 ENCOUNTER — Encounter: Payer: Self-pay | Admitting: Internal Medicine

## 2019-11-21 NOTE — Telephone Encounter (Signed)
Called left voicemail ...

## 2019-12-10 ENCOUNTER — Ambulatory Visit: Payer: Medicare Other | Admitting: Neurology

## 2019-12-10 ENCOUNTER — Encounter: Payer: Self-pay | Admitting: Neurology

## 2019-12-10 VITALS — BP 142/83 | HR 93 | Ht 62.0 in | Wt 192.0 lb

## 2019-12-10 DIAGNOSIS — G4486 Cervicogenic headache: Secondary | ICD-10-CM | POA: Diagnosis not present

## 2019-12-10 DIAGNOSIS — G43019 Migraine without aura, intractable, without status migrainosus: Secondary | ICD-10-CM | POA: Insufficient documentation

## 2019-12-10 NOTE — Progress Notes (Signed)
Reason for visit: Cervicogenic headache, migraine  Toni [name] is an 64 y.o. female  History of present illness:  Toni [name] is a 64 year old left-handed white female with a history of neck pain and shoulder discomfort and headache.  The patient is felt to have a component of cervicogenic headache but she also has a longstanding history of migraine.  Imitrex works for her migraine.  She recently had MRI of the cervical spine done through Dr. Venetia Maxon, cervical arthritis was noted.  She is being followed through pain management through his office through Dr. Lorrine Kin.  She has received occipital nerve injections in epidural injections with good benefit.  She reports that she is under a lot of stress.  She has trouble with making decisions to do things.  In the past, she was evaluated for possible frontotemporal dementia but this was felt not to be the case.  She returns for an evaluation.  Past Medical History:  Diagnosis Date  . Brachial neuritis or radiculitis NOS   . Chronic hepatitis C (HCC) 10/05/2015  . Depressive disorder, not elsewhere classified   . Essential and other specified forms of tremor   . Fracture of 5th metatarsal 08/13/2017  . Fracture of multiple pubic rami (HCC) 08/13/2017  . Headache(784.0)   . Hepatitis C infection 12/05/2017   Treated and now resolved and "cured " as of early May 02, 2016  And     . Memory loss   . Microscopic hematuria    seed dr Patsi Sears in past  . Migraine, unspecified, without mention of intractable migraine without mention of status migrainosus   . Multiple rib fractures 08/13/2017  . Other specified disorders of rotator cuff syndrome of shoulder and allied disorders   . Vulvar pain    rx with valium supp    Past Surgical History:  Procedure Laterality Date  . BREAST EXCISIONAL BIOPSY Right    No scar  . CESAREAN SECTION     x3  . SHOULDER ARTHROSCOPY W/ ROTATOR CUFF REPAIR Right 05-03-2002  . SHOULDER ARTHROSCOPY W/ ROTATOR  CUFF REPAIR Left 2006-05-03    Family History  Problem Relation Age of Onset  . Headache Mother   . Stroke Father        father passed away june17  . Diabetes Maternal Grandmother   . Other Son        Mother is deceased 03-May-2011 father moved to the area 05-02-2012.    Social history:  reports that she quit smoking about 41 years ago. Her smoking use included cigarettes. She has never used smokeless tobacco. She reports that she does not drink alcohol and does not use drugs.    Allergies  Allergen Reactions  . Codeine Nausea And Vomiting and Other (See Comments)    Shuts down GI tract    Medications:  Prior to Admission medications   Medication Sig Start Date End Date Taking? Authorizing Provider  albuterol (PROVENTIL HFA;VENTOLIN HFA) 108 (90 Base) MCG/ACT inhaler Inhale 2 puffs into the lungs every 6 (six) hours as needed for wheezing or shortness of breath. 07/04/16  Yes Gordy Savers, MD  ALPRAZolam Prudy Feeler) 1 MG tablet Take 1 tablet (1 mg total) by mouth 4 (four) times daily as needed for anxiety (shakes). 08/10/17  Yes Albertine Grates, MD  Alum Hydroxide-Mag Trisilicate (GAVISCON) 80-14.2 MG CHEW Chew by mouth. CHEW 2 TABLETS BY MOUTH BEFORE EACH MEAL AT BEDTIME AS NEEDED FOR INDIGESTION OR HEARTBURN   Yes [provider]  amLODipine (NORVASC)  2.5 MG tablet TAKE ONE TABLET BY MOUTH DAILY 07/16/19  Yes Panosh, Neta Mends, MD  cetirizine (ZYRTEC) 10 MG tablet Take 10 mg by mouth daily as needed for allergies.   Yes [provider]  desvenlafaxine (PRISTIQ) 50 MG 24 hr tablet Take 50 mg by mouth daily.   Yes [provider]  dextroamphetamine (DEXEDRINE SPANSULE) 15 MG 24 hr capsule Take 30-45 mg by mouth daily. 2 po q am and 1 po q noon   Yes Milagros Evener, MD  FLUoxetine (PROZAC) 20 MG capsule Take 1 capsule by mouth daily. 10/09/14  Yes [provider]  gabapentin (NEURONTIN) 600 MG tablet Take 600 mg by mouth. PO TID FOR HEAD PAIN   Yes [provider]   HYDROcodone-ibuprofen (VICOPROFEN) 7.5-200 MG tablet every 6 (six) hours as needed.  03/03/18  Yes [provider]  ketoconazole (NIZORAL) 2 % cream APPLY TO AFFECTED AREA TWICE A DAY 11/18/19  Yes Panosh, Neta Mends, MD  levothyroxine (SYNTHROID) 25 MCG tablet TAKE 1 TABLET (25 MCG TOTAL) BY MOUTH DAILY BEFORE BREAKFAST. TAKE ON AN EMPTY STOMACH 09/02/19  Yes Panosh, Neta Mends, MD  Misc Natural Products (ESTROVEN + ENERGY MAX STRENGTH) TABS Take by mouth daily.   Yes [provider]  Multiple Vitamins-Minerals (CENTRUM SILVER ADULT 50+) TABS Take 1 tablet by mouth daily.   Yes [provider]  SUMAtriptan (IMITREX) 25 MG tablet Take 1 tablet (25 mg total) by mouth as needed for migraine. May repeat in 2 hours if headache persists or recurs. 01/21/19  Yes Van Clines, MD  SUMAtriptan 6 MG/0.5ML SOAJ Use 1 syringe as needed for migraines upon awakening. Do not use with imitrex tablet together. 01/21/19  Yes Van Clines, MD    ROS:  Out of a complete 14 system review of symptoms, the patient complains only of the following symptoms, and all other reviewed systems are negative.  Headache Neck discomfort Depression  Blood pressure (!) 142/83, pulse 93, height 5\' 2"  (1.575 m), weight 192 lb (87.1 kg).  Physical Exam  General: The patient is alert and cooperative at the time of the examination.  The patient is markedly obese.  Neuromuscular: The patient lacks about 15 degrees of full lateral rotation of the cervical spine bilaterally.  Skin: No significant peripheral edema is noted.   Neurologic Exam  Mental status: The patient is alert and oriented x 3 at the time of the examination. The patient has apparent normal recent and remote memory, with an apparently normal attention span and concentration ability.   Cranial nerves: Facial symmetry is present. Speech is normal, no aphasia or dysarthria is noted. Extraocular movements are full. Visual fields are  full.  Motor: The patient has good strength in all 4 extremities.  Sensory examination: Soft touch sensation is symmetric on the face, arms, and legs.  Coordination: The patient has good finger-nose-finger and heel-to-shin bilaterally.  Gait and station: The patient has a normal gait. Tandem gait is slightly unsteady. Romberg is negative, but is somewhat unsteady. No drift is seen.  Reflexes: Deep tendon reflexes are symmetric.   MRI cervical 09/17/19:  IMPRESSION: 1. Progression of mild left neural foraminal stenosis at C2-3. 2. Unchanged severe left neural foraminal stenosis at C5-6. 3. No spinal canal stenosis.   MRI brain 02/27/19:  IMPRESSION: 1. Severe facet osteoarthritis on the right at C1-2 with inflammatory features. There is also advanced facet osteoarthritis on the left at C2-3. Consider cervicogenic cause of headache. 2. Stable  intracranial imaging with no specific cause for headache.  * MRI scan images were reviewed online. I agree with the written report.     Assessment/Plan:  1.  Cervicogenic headache  2.  Migraine headache  The patient is getting good therapy through Dr. Lorrine Kin.  She is improving with her neck and shoulder discomfort.  She still has migraine headaches and will require some Imitrex off and on.  She will follow up here on an as-needed basis.  Marlan Palau MD 12/10/2019 12:59 PM  [facility] [address] [place], [place] [address]  Phone [phone] Fax [phone]

## 2020-01-01 ENCOUNTER — Ambulatory Visit: Payer: Medicare Other | Admitting: Internal Medicine

## 2020-01-28 ENCOUNTER — Other Ambulatory Visit: Payer: Self-pay | Admitting: Neurology

## 2020-02-06 ENCOUNTER — Other Ambulatory Visit: Payer: Self-pay | Admitting: Neurosurgery

## 2020-02-06 DIAGNOSIS — M5416 Radiculopathy, lumbar region: Secondary | ICD-10-CM

## 2020-02-18 ENCOUNTER — Encounter: Payer: Self-pay | Admitting: Internal Medicine

## 2020-02-26 ENCOUNTER — Other Ambulatory Visit: Payer: Self-pay

## 2020-02-26 ENCOUNTER — Ambulatory Visit
Admission: RE | Admit: 2020-02-26 | Discharge: 2020-02-26 | Disposition: A | Discharge status: Home / Self Care | Payer: Medicare Other | Source: Ambulatory Visit | Attending: Neurosurgery | Admitting: Neurosurgery

## 2020-02-26 DIAGNOSIS — M5416 Radiculopathy, lumbar region: Secondary | ICD-10-CM

## 2020-02-29 ENCOUNTER — Other Ambulatory Visit: Payer: Medicare Other

## 2020-03-10 ENCOUNTER — Other Ambulatory Visit: Payer: Self-pay | Admitting: Internal Medicine

## 2020-03-12 ENCOUNTER — Other Ambulatory Visit: Payer: Self-pay | Admitting: Emergency Medicine

## 2020-03-12 MED ORDER — SUMATRIPTAN SUCCINATE 25 MG PO TABS: 25.0000 mg | ORAL_TABLET | ORAL | 11 refills | Status: AC | PRN

## 2020-03-12 MED ORDER — SUMATRIPTAN SUCCINATE 6 MG/0.5ML ~~LOC~~ SOAJ: SUBCUTANEOUS | 6 refills | Status: AC

## 2020-03-19 ENCOUNTER — Telehealth (INDEPENDENT_AMBULATORY_CARE_PROVIDER_SITE_OTHER): Payer: Medicare Other | Admitting: Family Medicine

## 2020-03-19 DIAGNOSIS — R0602 Shortness of breath: Secondary | ICD-10-CM

## 2020-03-19 DIAGNOSIS — R059 Cough, unspecified: Secondary | ICD-10-CM | POA: Diagnosis not present

## 2020-03-19 DIAGNOSIS — R5381 Other malaise: Secondary | ICD-10-CM | POA: Diagnosis not present

## 2020-03-19 NOTE — Progress Notes (Addendum)
Virtual Visit via Video Note  I connected with [place]  on 03/19/20 at  4:40 PM EDT by a video enabled telemedicine application and verified that I am speaking with the correct person using two identifiers.  Location patient: home, [place] Location provider:work or home office Persons participating in the virtual visit: patient, provider  I discussed the limitations of evaluation and management by telemedicine and the availability of in person appointments. The patient expressed understanding and agreed to proceed.   HPI:  Acute telemedicine visit for nasal congestion, cough: -Onset: 4 days ago -Symptoms include: nasal congestion, cough, chills, SOB, feels like she can't get out of bed -Denies:  CP, inability to eat/drink -Pertinent past medical history: CAP, hx of seasonal allergies -she is worried about covid or CAP - but she was not able to complete a covid test at home and does not have any more covid tests and is concerned it would be difficult to do with the degree of nasal congestion -she has a child she is caring for and his teacher is out sick -Pertinent medication allergies: codeine -COVID-19 vaccine status: has had covid shots and her booster and flu shot  ROS: See pertinent positives and negatives per HPI.  Past Medical History:  Diagnosis Date  . Brachial neuritis or radiculitis NOS   . Chronic hepatitis C (HCC) 10/05/2015  . Depressive disorder, not elsewhere classified   . Essential and other specified forms of tremor   . Fracture of 5th metatarsal 08/13/2017  . Fracture of multiple pubic rami (HCC) 08/13/2017  . Headache(784.0)   . Hepatitis C infection 12/05/2017   Treated and now resolved and "cured " as of early 2018  And     . Memory loss   . Microscopic hematuria    seed dr Patsi Sears in past  . Migraine, unspecified, without mention of intractable migraine without mention of status migrainosus   . Multiple rib fractures 08/13/2017  . Other specified disorders of  rotator cuff syndrome of shoulder and allied disorders   . Vulvar pain    rx with valium supp    Past Surgical History:  Procedure Laterality Date  . BREAST EXCISIONAL BIOPSY Right    No scar  . CESAREAN SECTION     x3  . SHOULDER ARTHROSCOPY W/ ROTATOR CUFF REPAIR Right 2004  . SHOULDER ARTHROSCOPY W/ ROTATOR CUFF REPAIR Left 2008     Current Outpatient Medications:  .  albuterol (PROVENTIL HFA;VENTOLIN HFA) 108 (90 Base) MCG/ACT inhaler, Inhale 2 puffs into the lungs every 6 (six) hours as needed for wheezing or shortness of breath., Disp: 1 Inhaler, Rfl: 0 .  ALPRAZolam (XANAX) 1 MG tablet, Take 1 tablet (1 mg total) by mouth 4 (four) times daily as needed for anxiety (shakes)., Disp: 5 tablet, Rfl: 0 .  Alum Hydroxide-Mag Trisilicate (GAVISCON) 80-14.2 MG CHEW, Chew by mouth. CHEW 2 TABLETS BY MOUTH BEFORE EACH MEAL AT BEDTIME AS NEEDED FOR INDIGESTION OR HEARTBURN, Disp: , Rfl:  .  amLODipine (NORVASC) 2.5 MG tablet, TAKE ONE TABLET BY MOUTH DAILY, Disp: 90 tablet, Rfl: 1 .  cetirizine (ZYRTEC) 10 MG tablet, Take 10 mg by mouth daily as needed for allergies., Disp: , Rfl:  .  desvenlafaxine (PRISTIQ) 50 MG 24 hr tablet, Take 50 mg by mouth daily., Disp: , Rfl:  .  dextroamphetamine (DEXEDRINE SPANSULE) 15 MG 24 hr capsule, Take 30-45 mg by mouth daily. 2 po q am and 1 po q noon, Disp: , Rfl:  .  FLUoxetine (PROZAC) 20 MG capsule, Take 1 capsule by mouth daily., Disp: , Rfl: 0 .  gabapentin (NEURONTIN) 600 MG tablet, Take 600 mg by mouth. PO TID FOR HEAD PAIN, Disp: , Rfl:  .  HYDROcodone-ibuprofen (VICOPROFEN) 7.5-200 MG tablet, every 6 (six) hours as needed. , Disp: , Rfl:  .  ketoconazole (NIZORAL) 2 % cream, APPLY TO AFFECTED AREA TWICE A DAY, Disp: 30 g, Rfl: 2 .  levothyroxine (SYNTHROID) 25 MCG tablet, TAKE 1 TABLET (25 MCG TOTAL) BY MOUTH DAILY BEFORE BREAKFAST. TAKE ON AN EMPTY STOMACH, Disp: 90 tablet, Rfl: 1 .  Misc Natural Products (ESTROVEN + ENERGY MAX STRENGTH) TABS,  Take by mouth daily., Disp: , Rfl:  .  Multiple Vitamins-Minerals (CENTRUM SILVER ADULT 50+) TABS, Take 1 tablet by mouth daily., Disp: , Rfl:  .  SUMAtriptan (IMITREX) 25 MG tablet, Take 1 tablet (25 mg total) by mouth as needed for migraine. May repeat in 2 hours if headache persists or recurs., Disp: 10 tablet, Rfl: 11 .  SUMAtriptan 6 MG/0.5ML SOAJ, Use 1 syringe for migraines upon awakening.  Do not use with Imitrex tablets., Disp: 0.5 mL, Rfl: 6  EXAM:  VITALS per patient if applicable:  GENERAL: alert, oriented, appears well and in no acute distress  HEENT: atraumatic, conjunttiva clear, no obvious abnormalities on inspection of external nose and ears  NECK: normal movements of the head and neck  LUNGS: on inspection no signs of respiratory distress, breathing rate appears normal, no obvious gross SOB, gasping or wheezing  CV: no obvious cyanosis  MS: moves all visible extremities without noticeable abnormality  PSYCH/NEURO: pleasant and cooperative, no obvious depression or anxiety, speech and thought processing grossly intact  ASSESSMENT AND PLAN:  Discussed the following assessment and plan:  Cough  Shortness of breath  Malaise  -we discussed possible serious and likely etiologies, options for evaluation and workup, limitations of telemedicine visit vs in person visit, treatment, treatment risks and precautions.  Given her current symptoms, the degree of symptoms and her concerns, I advised an in person evaluation today at higher level of care.  She agrees.  Discussed options for in person care. Scheduled follow up with PCP offered: Agrees to schedule follow-up with primary care provider if needed, after the in person evaluation today. Did let this patient know that I only do telemedicine on Tuesdays and Thursdays for [place]. Advised to schedule follow up visit with PCP or [facility] if any further questions or concerns to avoid delays in care.   I discussed the assessment  and treatment plan with the patient. The patient was provided an opportunity to ask questions and all were answered. The patient agreed with the plan and demonstrated an understanding of the instructions.     Terressa Koyanagi, DO

## 2020-03-19 NOTE — Patient Instructions (Signed)
Please seek evaluation today at the urgent care as soon as possible as we discussed.   I hope you are feeling better soon!   It was nice to meet you today. I help [facility] out with telemedicine visits on Tuesdays and Thursdays and am available for visits on those days. If you have any concerns or questions following this visit please schedule a follow up visit with your Primary Care doctor or seek care at a local urgent care clinic to avoid delays in care.

## 2020-04-06 ENCOUNTER — Other Ambulatory Visit: Payer: Medicare Other

## 2020-04-07 ENCOUNTER — Other Ambulatory Visit (INDEPENDENT_AMBULATORY_CARE_PROVIDER_SITE_OTHER): Payer: Medicare Other

## 2020-04-07 ENCOUNTER — Other Ambulatory Visit: Payer: Self-pay

## 2020-04-07 DIAGNOSIS — I10 Essential (primary) hypertension: Secondary | ICD-10-CM | POA: Diagnosis not present

## 2020-04-07 DIAGNOSIS — G8929 Other chronic pain: Secondary | ICD-10-CM

## 2020-04-07 DIAGNOSIS — E785 Hyperlipidemia, unspecified: Secondary | ICD-10-CM

## 2020-04-07 DIAGNOSIS — E038 Other specified hypothyroidism: Secondary | ICD-10-CM

## 2020-04-07 DIAGNOSIS — Z79899 Other long term (current) drug therapy: Secondary | ICD-10-CM

## 2020-04-07 DIAGNOSIS — M5481 Occipital neuralgia: Secondary | ICD-10-CM

## 2020-04-07 DIAGNOSIS — R519 Headache, unspecified: Secondary | ICD-10-CM

## 2020-04-07 LAB — HEPATIC FUNCTION PANEL
ALT: 53 U/L — ABNORMAL HIGH (ref 0–35)
AST: 28 U/L (ref 0–37)
Albumin: 4.2 g/dL (ref 3.5–5.2)
Alkaline Phosphatase: 98 U/L (ref 39–117)
Bilirubin, Direct: 0.1 mg/dL (ref 0.0–0.3)
Total Bilirubin: 0.3 mg/dL (ref 0.2–1.2)
Total Protein: 6.7 g/dL (ref 6.0–8.3)

## 2020-04-07 LAB — LIPID PANEL
Cholesterol: 212 mg/dL — ABNORMAL HIGH (ref 0–200)
HDL: 49.1 mg/dL (ref >39.00)
LDL Cholesterol: 140 mg/dL — ABNORMAL HIGH (ref 0–99)
NonHDL: 163.28
Total CHOL/HDL Ratio: 4
Triglycerides: 115 mg/dL (ref 0.0–149.0)
VLDL: 23 mg/dL (ref 0.0–40.0)

## 2020-04-07 LAB — BASIC METABOLIC PANEL
BUN: 30 mg/dL — ABNORMAL HIGH (ref 6–23)
CO2: 27 mEq/L (ref 19–32)
Calcium: 9.3 mg/dL (ref 8.4–10.5)
Chloride: 105 mEq/L (ref 96–112)
Creatinine, Ser: 0.75 mg/dL (ref 0.40–1.20)
GFR: 82.91 mL/min (ref >60.00)
Glucose, Bld: 92 mg/dL (ref 70–99)
Potassium: 4.4 mEq/L (ref 3.5–5.1)
Sodium: 143 mEq/L (ref 135–145)

## 2020-04-07 LAB — CBC WITH DIFFERENTIAL/PLATELET
Basophils Absolute: 0 10*3/uL (ref 0.0–0.1)
Basophils Relative: 0.6 % (ref 0.0–3.0)
Eosinophils Absolute: 0.1 10*3/uL (ref 0.0–0.7)
Eosinophils Relative: 2.7 % (ref 0.0–5.0)
HCT: 38.6 % (ref 36.0–46.0)
Hemoglobin: 12.9 g/dL (ref 12.0–15.0)
Lymphocytes Relative: 37.5 % (ref 12.0–46.0)
Lymphs Abs: 1.8 10*3/uL (ref 0.7–4.0)
MCHC: 33.5 g/dL (ref 30.0–36.0)
MCV: 88.5 fl (ref 78.0–100.0)
Monocytes Absolute: 0.5 10*3/uL (ref 0.1–1.0)
Monocytes Relative: 10.7 % (ref 3.0–12.0)
Neutro Abs: 2.3 10*3/uL (ref 1.4–7.7)
Neutrophils Relative %: 48.5 % (ref 43.0–77.0)
Platelets: 217 10*3/uL (ref 150.0–400.0)
RBC: 4.36 Mil/uL (ref 3.87–5.11)
RDW: 13.3 % (ref 11.5–15.5)
WBC: 4.7 10*3/uL (ref 4.0–10.5)

## 2020-04-07 LAB — TSH: TSH: 5.37 u[IU]/mL — ABNORMAL HIGH (ref 0.35–4.50)

## 2020-04-07 NOTE — Addendum Note (Signed)
Addended by: Inola Lisle K on: 04/07/2020 08:00 AM   Modules accepted: Orders  

## 2020-04-07 NOTE — Addendum Note (Signed)
Addended by: Windel Keziah K on: 04/07/2020 08:00 AM   Modules accepted: Orders  

## 2020-04-07 NOTE — Addendum Note (Signed)
Addended by: Rossie Muskrat K on: 04/07/2020 08:00 AM   Modules accepted: Orders

## 2020-04-07 NOTE — Addendum Note (Signed)
Addended by: Yonatan Guitron K on: 04/07/2020 08:00 AM   Modules accepted: Orders  

## 2020-04-09 NOTE — Progress Notes (Signed)
Thyroid off a little bit again   will revewi at  your upcoming appt  we may adjust dose at that time

## 2020-04-10 ENCOUNTER — Encounter: Payer: Medicare Other | Admitting: Internal Medicine

## 2020-04-13 ENCOUNTER — Other Ambulatory Visit: Payer: Self-pay

## 2020-04-13 ENCOUNTER — Encounter: Payer: Self-pay | Admitting: Internal Medicine

## 2020-04-13 ENCOUNTER — Ambulatory Visit (INDEPENDENT_AMBULATORY_CARE_PROVIDER_SITE_OTHER): Payer: Medicare Other | Admitting: Internal Medicine

## 2020-04-13 ENCOUNTER — Other Ambulatory Visit: Payer: Medicare Other

## 2020-04-13 VITALS — BP 122/70 | HR 87 | Temp 98.2°F | Ht 61.5 in | Wt 195.2 lb

## 2020-04-13 DIAGNOSIS — E038 Other specified hypothyroidism: Secondary | ICD-10-CM

## 2020-04-13 DIAGNOSIS — E785 Hyperlipidemia, unspecified: Secondary | ICD-10-CM

## 2020-04-13 DIAGNOSIS — Z79899 Other long term (current) drug therapy: Secondary | ICD-10-CM

## 2020-04-13 DIAGNOSIS — Z0000 Encounter for general adult medical examination without abnormal findings: Secondary | ICD-10-CM | POA: Diagnosis not present

## 2020-04-13 DIAGNOSIS — M5030 Other cervical disc degeneration, unspecified cervical region: Secondary | ICD-10-CM | POA: Diagnosis not present

## 2020-04-13 DIAGNOSIS — F411 Generalized anxiety disorder: Secondary | ICD-10-CM

## 2020-04-13 DIAGNOSIS — I10 Essential (primary) hypertension: Secondary | ICD-10-CM | POA: Diagnosis not present

## 2020-04-13 DIAGNOSIS — M5481 Occipital neuralgia: Secondary | ICD-10-CM

## 2020-04-13 NOTE — Progress Notes (Signed)
Chief Complaint  Patient presents with  . Annual Exam    HPI: Patient  Toni [name]  65 y.o. comes in today for Preventive Health Care visit    Thyroid:  Had stopped taking cause hard to fit in  Around other  meds  So restarted since say tsh results .  Neck c spine Problematic  Off and on   To go to PT  Per dr Berenice Bouton al   at neurology prn  Gait issues seem mechanical  Neuro rx the migraine meds   Sees dr Amalia Hailey visits  Health Maintenance  Topic Date Due  . PNA vac Low Risk Adult (2 of 2 - PPSV23) 05/14/2020  . INFLUENZA VACCINE  08/03/2020  . MAMMOGRAM  07/29/2021  . COLONOSCOPY (Pts 45-11yrs Insurance coverage will need to be confirmed)  03/29/2026  . TETANUS/TDAP  07/20/2027  . DEXA SCAN  Completed  . COVID-19 Vaccine  Completed  . Hepatitis C Screening  Completed  . HPV VACCINES  Aged Out   Health Maintenance Review LIFESTYLE:  Exercise:   Gardening .  Tobacco/ETS: no Alcohol: rare  Sugar beverages:  Limited  Sleep: none to then a lot  Drug use: no HH of same   Her son doing better .   Grandson in second grade    ROS:  See above  GEN/ HEENT: No fever, significant weight changes sweat vision problems hearing changes, CV/ PULM; No chest pain shortness of breath cough, syncope,edema  change in exercise tolerance. GI /GU: No adominal pain, vomiting, change in bowel habits. No blood in the stool. No significant GU symptoms. SKIN/HEME: ,no acute skin rashes suspicious lesions or bleeding. No lymphadenopathy, nodules, masses.  NEURO/ PSYCH:  No neurologic signs such as weakness numbness. No depression anxiety. IMM/ Allergy: No unusual infections.  Allergy .   REST of 12 system review negative except as per HPI   Past Medical History:  Diagnosis Date  . Brachial neuritis or radiculitis NOS   . Chronic hepatitis C (HCC) 10/05/2015  . Depressive disorder, not elsewhere classified   . Essential and other specified forms of tremor   . Fracture of  5th metatarsal 08/13/2017  . Fracture of multiple pubic rami (HCC) 08/13/2017  . Headache(784.0)   . Hepatitis C infection 12/05/2017   Treated and now resolved and "cured " as of early 2016/04/18  And     . Memory loss   . Microscopic hematuria    seed dr Patsi Sears in past  . Migraine, unspecified, without mention of intractable migraine without mention of status migrainosus   . Multiple rib fractures 08/13/2017  . Other specified disorders of rotator cuff syndrome of shoulder and allied disorders   . Vulvar pain    rx with valium supp    Past Surgical History:  Procedure Laterality Date  . BREAST EXCISIONAL BIOPSY Right    No scar  . CESAREAN SECTION     x3  . SHOULDER ARTHROSCOPY W/ ROTATOR CUFF REPAIR Right 04/19/02  . SHOULDER ARTHROSCOPY W/ ROTATOR CUFF REPAIR Left 04/19/06    Family History  Problem Relation Age of Onset  . Headache Mother   . Stroke Father        father passed away june17  . Diabetes Maternal Grandmother   . Other Son        Mother is deceased 04-19-11 father moved to the area 04-18-12.    Social History   Socioeconomic History  . Marital status: Divorced  Spouse name: Not on file  . Number of children: 3  . Years of education: Not on file  . Highest education level: Not on file  Occupational History  . Not on file  Tobacco Use  . Smoking status: Former Smoker    Types: Cigarettes    Quit date: 05/23/1978    Years since quitting: 41.9  . Smokeless tobacco: Never Used  Vaping Use  . Vaping Use: Never used  Substance and Sexual Activity  . Alcohol use: No    Alcohol/week: 0.0 standard drinks  . Drug use: No  . Sexual activity: Not on file  Other Topics Concern  . Not on file  Social History Narrative   Lives with grandson   Left Handed   Drinks 1-2 cups daily   Social Determinants of Health   Financial Resource Strain: Not on file  Food Insecurity: Not on file  Transportation Needs: Not on file  Physical Activity: Not on file  Stress: Not on file   Social Connections: Not on file    Outpatient Medications Prior to Visit  Medication Sig Dispense Refill  . albuterol (PROVENTIL HFA;VENTOLIN HFA) 108 (90 Base) MCG/ACT inhaler Inhale 2 puffs into the lungs every 6 (six) hours as needed for wheezing or shortness of breath. 1 Inhaler 0  . ALPRAZolam (XANAX) 1 MG tablet Take 1 tablet (1 mg total) by mouth 4 (four) times daily as needed for anxiety (shakes). 5 tablet 0  . Alum Hydroxide-Mag Trisilicate 80-14.2 MG CHEW Chew by mouth. CHEW 2 TABLETS BY MOUTH BEFORE EACH MEAL AT BEDTIME AS NEEDED FOR INDIGESTION OR HEARTBURN    . amLODipine (NORVASC) 2.5 MG tablet TAKE ONE TABLET BY MOUTH DAILY 90 tablet 1  . cetirizine (ZYRTEC) 10 MG tablet Take 10 mg by mouth daily as needed for allergies.    Marland Kitchen desvenlafaxine (PRISTIQ) 50 MG 24 hr tablet Take 50 mg by mouth daily.    Marland Kitchen dextroamphetamine (DEXEDRINE SPANSULE) 15 MG 24 hr capsule Take 30-45 mg by mouth daily. 2 po q am and 1 po q noon    . FLUoxetine (PROZAC) 20 MG capsule Take 1 capsule by mouth daily.  0  . gabapentin (NEURONTIN) 600 MG tablet Take 600 mg by mouth. PO TID FOR HEAD PAIN    . HYDROcodone-ibuprofen (VICOPROFEN) 7.5-200 MG tablet every 6 (six) hours as needed.     Marland Kitchen ketoconazole (NIZORAL) 2 % cream APPLY TO AFFECTED AREA TWICE A DAY 30 g 2  . levothyroxine (SYNTHROID) 25 MCG tablet TAKE 1 TABLET (25 MCG TOTAL) BY MOUTH DAILY BEFORE BREAKFAST. TAKE ON AN EMPTY STOMACH 90 tablet 1  . Misc Natural Products (ESTROVEN + ENERGY MAX STRENGTH) TABS Take by mouth daily.    . Multiple Vitamins-Minerals (CENTRUM SILVER ADULT 50+) TABS Take 1 tablet by mouth daily.    . SUMAtriptan (IMITREX) 25 MG tablet Take 1 tablet (25 mg total) by mouth as needed for migraine. May repeat in 2 hours if headache persists or recurs. 10 tablet 11  . SUMAtriptan 6 MG/0.5ML SOAJ Use 1 syringe for migraines upon awakening.  Do not use with Imitrex tablets. 0.5 mL 6   No facility-administered medications prior to  visit.     EXAM:  BP 122/70 (BP Location: Left Arm, Patient Position: Sitting, Cuff Size: Large)   Pulse 87   Temp 98.2 F (36.8 C) (Oral)   Ht 5' 1.5" (1.562 m)   Wt 195 lb 3.2 oz (88.5 kg)   SpO2 95%  BMI 36.29 kg/m   Body mass index is 36.29 kg/m. Wt Readings from Last 3 Encounters:  04/13/20 195 lb 3.2 oz (88.5 kg)  12/10/19 192 lb (87.1 kg)  11/18/19 192 lb (87.1 kg)    Physical Exam: Vital signs reviewed MVV:KPQA is a well-developed well-nourished alert cooperative    who appearsr stated age in no acute distress.  ocass  tremor HEENT: normocephalic atraumatic , Eyes: PERRL EOM's full, conjunctiva clear, Nares: paten,t no deformity discharge or tenderness., Ears: no deformity EAC's clear TMs with normal landmarks. Mouth masked NECK: supple without masses, thyromegaly or bruits. CHEST/PULM:  Clear to auscultation and percussion breath sounds equal no wheeze , rales or rhonchi. Breast: normal by inspection . No dimpling, discharge, masses, tenderness or discharge . CV: PMI is nondisplaced, S1 S2 no gallops, murmurs, rubs. Peripheral pulses are present .No JVD .  ABDOMEN: Bowel sounds normal nontender  No guard or rebound, no hepato splenomegal no CVA tenderness.   Extremtities:  No clubbing cyanosis or edema, no acute joint swelling or redness no focal atrophy NEURO:  Oriented x3, cranial nerves 3-12 appear to be intact,  SKIN: No acute rashes normal turgor, color, no bruising or petechiae. PSYCH: Oriented, good eye contact, no obvious depression  LN: no cervical axillary adenopathy  Lab Results  Component Value Date   WBC 4.7 04/07/2020   HGB 12.9 04/07/2020   HCT 38.6 04/07/2020   PLT 217.0 04/07/2020   GLUCOSE 92 04/07/2020   CHOL 212 (H) 04/07/2020   TRIG 115.0 04/07/2020   HDL 49.10 04/07/2020   LDLCALC 140 (H) 04/07/2020   ALT 53 (H) 04/07/2020   AST 28 04/07/2020   NA 143 04/07/2020   K 4.4 04/07/2020   CL 105 04/07/2020   CREATININE 0.75 04/07/2020    BUN 30 (H) 04/07/2020   CO2 27 04/07/2020   TSH 5.37 (H) 04/07/2020   HGBA1C 5.8 02/28/2017    BP Readings from Last 3 Encounters:  04/13/20 122/70  12/10/19 (!) 142/83  11/18/19 134/76    Lab results reviewed with patient   ASSESSMENT AND PLAN:  Discussed the following assessment and plan:    ICD-10-CM   1. Visit for preventive health examination  Z00.00   2. Medication management  Z79.899 TSH    T4, free    Hepatic function panel  3. Essential hypertension  I10   4. Degenerative disc disease, cervical  M50.30   5. Generalized anxiety disorder  F41.1   6. Hyperlipidemia, unspecified hyperlipidemia type  E78.5 TSH    T4, free    Hepatic function panel  7. Subclinical hypothyroidism  E03.8 TSH    T4, free    Hepatic function panel  8. Occipital neuralgia of right side  M54.81    Return for lab in 2-3 months   then go from there. .  Patient Care Team: Dorwin Fitzhenry, Neta Mends, MD as PCP - General (Internal Medicine) Shirlean Kelly, MD as Consulting Physician (Neurosurgery) Milagros Evener, MD as Consulting Physician (Psychiatry) Candace Gallus as Physician Assistant (Neurosurgery) York Spaniel, MD as Consulting Physician (Neurology) Patient Instructions   After getting back on the thyroid med  Check tsh  In 2-3 months .   And will continue when in correct range.  Cholesterol could be better   Diet and weight control.   You may benefir from pt ot as discussed    The 10-year ASCVD risk score Denman George DC Jr., et al., 2013) is: 8.1%   Values  used to calculate the score:     Age: 51 years     Sex: Female     Is Non-Hispanic African American: No     Diabetic: No     Tobacco smoker: No     Systolic Blood Pressure: 122 mmHg     Is BP treated: Yes     HDL Cholesterol: 49.1 mg/dL     Total Cholesterol: 212 mg/dL  Health Maintenance, Female Adopting a healthy lifestyle and getting preventive care are important in promoting health and wellness. Ask  your health care provider about:  The right schedule for you to have regular tests and exams.  Things you can do on your own to prevent diseases and keep yourself healthy. What should I know about diet, weight, and exercise? Eat a healthy diet  Eat a diet that includes plenty of vegetables, fruits, low-fat dairy products, and lean protein.  Do not eat a lot of foods that are high in solid fats, added sugars, or sodium.   Maintain a healthy weight Body mass index (BMI) is used to identify weight problems. It estimates body fat based on height and weight. Your health care provider can help determine your BMI and help you achieve or maintain a healthy weight. Get regular exercise Get regular exercise. This is one of the most important things you can do for your health. Most adults should:  Exercise for at least 150 minutes each week. The exercise should increase your heart rate and make you sweat (moderate-intensity exercise).  Do strengthening exercises at least twice a week. This is in addition to the moderate-intensity exercise.  Spend less time sitting. Even light physical activity can be beneficial. Watch cholesterol and blood lipids Have your blood tested for lipids and cholesterol at 67 years of age, then have this test every 5 years. Have your cholesterol levels checked more often if:  Your lipid or cholesterol levels are high.  You are older than 67 years of age.  You are at high risk for heart disease. What should I know about cancer screening? Depending on your health history and family history, you may need to have cancer screening at various ages. This may include screening for:  Breast cancer.  Cervical cancer.  Colorectal cancer.  Skin cancer.  Lung cancer. What should I know about heart disease, diabetes, and high blood pressure? Blood pressure and heart disease  High blood pressure causes heart disease and increases the risk of stroke. This is more likely to  develop in people who have high blood pressure readings, are of African descent, or are overweight.  Have your blood pressure checked: ? Every 3-5 years if you are 65-20 years of age. ? Every year if you are 19 years old or older. Diabetes Have regular diabetes screenings. This checks your fasting blood sugar level. Have the screening done:  Once every three years after age 46 if you are at a normal weight and have a low risk for diabetes.  More often and at a younger age if you are overweight or have a high risk for diabetes. What should I know about preventing infection? Hepatitis B If you have a higher risk for hepatitis B, you should be screened for this virus. Talk with your health care provider to find out if you are at risk for hepatitis B infection. Hepatitis C Testing is recommended for:  Everyone born from [birth year] through 1967.  Anyone with known risk factors for hepatitis C. Sexually transmitted infections (STIs)  Get screened for STIs, including gonorrhea and chlamydia, if: ? You are sexually active and are younger than 67 years of age. ? You are older than 67 years of age and your health care provider tells you that you are at risk for this type of infection. ? Your sexual activity has changed since you were last screened, and you are at increased risk for chlamydia or gonorrhea. Ask your health care provider if you are at risk.  Ask your health care provider about whether you are at high risk for HIV. Your health care provider may recommend a prescription medicine to help prevent HIV infection. If you choose to take medicine to prevent HIV, you should first get tested for HIV. You should then be tested every 3 months for as long as you are taking the medicine. Pregnancy  If you are about to stop having your period (premenopausal) and you may become pregnant, seek counseling before you get pregnant.  Take 400 to 800 micrograms (mcg) of folic acid every day if you become  pregnant.  Ask for birth control (contraception) if you want to prevent pregnancy. Osteoporosis and menopause Osteoporosis is a disease in which the bones lose minerals and strength with aging. This can result in bone fractures. If you are 67 years old or older, or if you are at risk for osteoporosis and fractures, ask your health care provider if you should:  Be screened for bone loss.  Take a calcium or vitamin D supplement to lower your risk of fractures.  Be given hormone replacement therapy (HRT) to treat symptoms of menopause. Follow these instructions at home: Lifestyle  Do not use any products that contain nicotine or tobacco, such as cigarettes, e-cigarettes, and chewing tobacco. If you need help quitting, ask your health care provider.  Do not use street drugs.  Do not share needles.  Ask your health care provider for help if you need support or information about quitting drugs. Alcohol use  Do not drink alcohol if: ? Your health care provider tells you not to drink. ? You are pregnant, may be pregnant, or are planning to become pregnant.  If you drink alcohol: ? Limit how much you use to 0-1 drink a day. ? Limit intake if you are breastfeeding.  Be aware of how much alcohol is in your drink. In the [place] one drink equals one 12 oz bottle of beer (355 mL), one 5 oz glass of wine (148 mL), or one 1 oz glass of hard liquor (44 mL). General instructions  Schedule regular health, dental, and eye exams.  Stay current with your vaccines.  Tell your health care provider if: ? You often feel depressed. ? You have ever been abused or do not feel safe at home. Summary  Adopting a healthy lifestyle and getting preventive care are important in promoting health and wellness.  Follow your health care provider's instructions about healthy diet, exercising, and getting tested or screened for diseases.  Follow your health care provider's instructions on monitoring your  cholesterol and blood pressure. This information is not intended to replace advice given to you by your health care provider. Make sure you discuss any questions you have with your health care provider. Document Revised: 12/13/2017 Document Reviewed: 12/13/2017 Elsevier Patient Education  2021 [employer]    Ladera RanchWanda K. Lynley Killilea M.D.

## 2020-04-13 NOTE — Patient Instructions (Addendum)
After getting back on the thyroid med  Check tsh  In 2-3 months .   And will continue when in correct range.  Cholesterol could be better   Diet and weight control.   You may benefir from pt ot as discussed    The 10-year ASCVD risk score Denman George DC Montez Hageman., et al., 2013) is: 8.1%   Values used to calculate the score:     Age: 65 years     Sex: Female     Is Non-Hispanic African American: No     Diabetic: No     Tobacco smoker: No     Systolic Blood Pressure: 122 mmHg     Is BP treated: Yes     HDL Cholesterol: 49.1 mg/dL     Total Cholesterol: 212 mg/dL  Health Maintenance, Female Adopting a healthy lifestyle and getting preventive care are important in promoting health and wellness. Ask your health care provider about:  The right schedule for you to have regular tests and exams.  Things you can do on your own to prevent diseases and keep yourself healthy. What should I know about diet, weight, and exercise? Eat a healthy diet  Eat a diet that includes plenty of vegetables, fruits, low-fat dairy products, and lean protein.  Do not eat a lot of foods that are high in solid fats, added sugars, or sodium.   Maintain a healthy weight Body mass index (BMI) is used to identify weight problems. It estimates body fat based on height and weight. Your health care provider can help determine your BMI and help you achieve or maintain a healthy weight. Get regular exercise Get regular exercise. This is one of the most important things you can do for your health. Most adults should:  Exercise for at least 150 minutes each week. The exercise should increase your heart rate and make you sweat (moderate-intensity exercise).  Do strengthening exercises at least twice a week. This is in addition to the moderate-intensity exercise.  Spend less time sitting. Even light physical activity can be beneficial. Watch cholesterol and blood lipids Have your blood tested for lipids and cholesterol at 67 years  of age, then have this test every 5 years. Have your cholesterol levels checked more often if:  Your lipid or cholesterol levels are high.  You are older than 66 years of age.  You are at high risk for heart disease. What should I know about cancer screening? Depending on your health history and family history, you may need to have cancer screening at various ages. This may include screening for:  Breast cancer.  Cervical cancer.  Colorectal cancer.  Skin cancer.  Lung cancer. What should I know about heart disease, diabetes, and high blood pressure? Blood pressure and heart disease  High blood pressure causes heart disease and increases the risk of stroke. This is more likely to develop in people who have high blood pressure readings, are of African descent, or are overweight.  Have your blood pressure checked: ? Every 3-5 years if you are 60-24 years of age. ? Every year if you are 22 years old or older. Diabetes Have regular diabetes screenings. This checks your fasting blood sugar level. Have the screening done:  Once every three years after age 30 if you are at a normal weight and have a low risk for diabetes.  More often and at a younger age if you are overweight or have a high risk for diabetes. What should I know about preventing  infection? Hepatitis B If you have a higher risk for hepatitis B, you should be screened for this virus. Talk with your health care provider to find out if you are at risk for hepatitis B infection. Hepatitis C Testing is recommended for:  Everyone born from [birth year] through 1967.  Anyone with known risk factors for hepatitis C. Sexually transmitted infections (STIs)  Get screened for STIs, including gonorrhea and chlamydia, if: ? You are sexually active and are younger than 67 years of age. ? You are older than 67 years of age and your health care provider tells you that you are at risk for this type of infection. ? Your sexual activity  has changed since you were last screened, and you are at increased risk for chlamydia or gonorrhea. Ask your health care provider if you are at risk.  Ask your health care provider about whether you are at high risk for HIV. Your health care provider may recommend a prescription medicine to help prevent HIV infection. If you choose to take medicine to prevent HIV, you should first get tested for HIV. You should then be tested every 3 months for as long as you are taking the medicine. Pregnancy  If you are about to stop having your period (premenopausal) and you may become pregnant, seek counseling before you get pregnant.  Take 400 to 800 micrograms (mcg) of folic acid every day if you become pregnant.  Ask for birth control (contraception) if you want to prevent pregnancy. Osteoporosis and menopause Osteoporosis is a disease in which the bones lose minerals and strength with aging. This can result in bone fractures. If you are 41 years old or older, or if you are at risk for osteoporosis and fractures, ask your health care provider if you should:  Be screened for bone loss.  Take a calcium or vitamin D supplement to lower your risk of fractures.  Be given hormone replacement therapy (HRT) to treat symptoms of menopause. Follow these instructions at home: Lifestyle  Do not use any products that contain nicotine or tobacco, such as cigarettes, e-cigarettes, and chewing tobacco. If you need help quitting, ask your health care provider.  Do not use street drugs.  Do not share needles.  Ask your health care provider for help if you need support or information about quitting drugs. Alcohol use  Do not drink alcohol if: ? Your health care provider tells you not to drink. ? You are pregnant, may be pregnant, or are planning to become pregnant.  If you drink alcohol: ? Limit how much you use to 0-1 drink a day. ? Limit intake if you are breastfeeding.  Be aware of how much alcohol is in  your drink. In the [place] one drink equals one 12 oz bottle of beer (355 mL), one 5 oz glass of wine (148 mL), or one 1 oz glass of hard liquor (44 mL). General instructions  Schedule regular health, dental, and eye exams.  Stay current with your vaccines.  Tell your health care provider if: ? You often feel depressed. ? You have ever been abused or do not feel safe at home. Summary  Adopting a healthy lifestyle and getting preventive care are important in promoting health and wellness.  Follow your health care provider's instructions about healthy diet, exercising, and getting tested or screened for diseases.  Follow your health care provider's instructions on monitoring your cholesterol and blood pressure. This information is not intended to replace advice given to you by  your health care provider. Make sure you discuss any questions you have with your health care provider. Document Revised: 12/13/2017 Document Reviewed: 12/13/2017 Elsevier Patient Education  2021 [employer].

## 2020-04-17 ENCOUNTER — Encounter: Payer: Medicare Other | Admitting: Internal Medicine

## 2020-04-20 ENCOUNTER — Encounter: Payer: Medicare Other | Admitting: Internal Medicine

## 2020-04-27 ENCOUNTER — Ambulatory Visit: Payer: Medicare Other | Attending: Neurosurgery | Admitting: Physical Therapy

## 2020-04-27 ENCOUNTER — Encounter: Payer: Self-pay | Admitting: Physical Therapy

## 2020-04-27 ENCOUNTER — Other Ambulatory Visit: Payer: Self-pay

## 2020-04-27 DIAGNOSIS — M545 Low back pain, unspecified: Secondary | ICD-10-CM | POA: Insufficient documentation

## 2020-04-27 DIAGNOSIS — R262 Difficulty in walking, not elsewhere classified: Secondary | ICD-10-CM | POA: Insufficient documentation

## 2020-04-27 DIAGNOSIS — M25551 Pain in right hip: Secondary | ICD-10-CM | POA: Diagnosis present

## 2020-04-27 DIAGNOSIS — M542 Cervicalgia: Secondary | ICD-10-CM | POA: Diagnosis present

## 2020-04-27 DIAGNOSIS — G8929 Other chronic pain: Secondary | ICD-10-CM | POA: Diagnosis present

## 2020-04-27 DIAGNOSIS — M62830 Muscle spasm of back: Secondary | ICD-10-CM

## 2020-04-27 DIAGNOSIS — M25561 Pain in right knee: Secondary | ICD-10-CM

## 2020-04-27 NOTE — Therapy (Signed)
[facility] [place] [facility] [address] [place], [place], [address] Phone: [phone]   Fax:  [phone]  Physical Therapy Evaluation  Patient Details  Name: Toni [name] MRN: [id number] Date of Birth: [birth year] Referring Provider (PT): Dr. Stephanie Acre   Encounter Date: 04/27/2020   PT End of Session - 04/27/20 1147    Visit Number 1    Date for PT Re-Evaluation 07/27/20    Authorization Type UHC medicare 10 visit medicare guidelines    Progress Note Due on Visit 10    PT Start Time 1055    PT Stop Time 1155    PT Time Calculation (min) 60 min    Activity Tolerance Patient tolerated treatment well    Behavior During Therapy [facility] for tasks assessed/performed           Past Medical History:  Diagnosis Date  . Brachial neuritis or radiculitis NOS   . Chronic hepatitis C (HCC) 10/05/2015  . Depressive disorder, not elsewhere classified   . Essential and other specified forms of tremor   . Fracture of 5th metatarsal 08/13/2017  . Fracture of multiple pubic rami (HCC) 08/13/2017  . Headache(784.0)   . Hepatitis C infection 12/05/2017   Treated and now resolved and "cured " as of early 2018  And     . Memory loss   . Microscopic hematuria    seed dr Patsi Sears in past  . Migraine, unspecified, without mention of intractable migraine without mention of status migrainosus   . Multiple rib fractures 08/13/2017  . Other specified disorders of rotator cuff syndrome of shoulder and allied disorders   . Vulvar pain    rx with valium supp    Past Surgical History:  Procedure Laterality Date  . BREAST EXCISIONAL BIOPSY Right    No scar  . CESAREAN SECTION     x3  . SHOULDER ARTHROSCOPY W/ ROTATOR CUFF REPAIR Right 2004  . SHOULDER ARTHROSCOPY W/ ROTATOR CUFF REPAIR Left 2008    There were no vitals filed for this visit.    Subjective Assessment - 04/27/20 1105    Subjective Patient reports a complicated history of  pain in the head, neck, knees feet and back.  Dr. Venetia Maxon has referred her to [place] for thoracic and lumbar pain.  recent MRI showed mild DDD, scoliosis, stenosis.    Pertinent History brachial neuritis, migraines, anxiety, tremors    Limitations Sitting;Lifting;Standing;Walking;House hold activities;Other (comment)    How long can you sit comfortably? 10 min without readjusting    How long can you stand comfortably? 10 min but pain increases    Diagnostic tests MRI in February 2022    Patient Stated Goals have less pain    Currently in Pain? Yes    Pain Score 9     Pain Location Hip   neck and back pain   Pain Orientation Right;Anterior    Pain Descriptors / Indicators Aching;Tightness;Spasm    Pain Type Chronic pain    Pain Radiating Towards denies    Pain Onset More than a month ago    Pain Frequency Constant    Aggravating Factors  everything hurts pain a 10/10 most times, pateint was tearful during history    Pain Relieving Factors changing posistions, gabapentin.  at best pain an 8/10    Effect of Pain on Daily Activities limits everything, limits quality of life              [facility] PT Assessment -  04/27/20 0001      Assessment   Medical Diagnosis back pain, right hip pain, leg pain, difficulty walking (P)     Referring Provider (PT) Venetia Maxon (P)     Onset Date/Surgical Date 02/28/20 (P)     Hand Dominance Left (P)     Prior Therapy yes a year ago (P)       Precautions   Precautions None (P)       Balance Screen   Has the patient fallen in the past 6 months No (P)       Home Environment   Additional Comments livews with and cares for her 60 year old grandchild.  does housework, some yardwork (P)       Prior Function   Level of Independence Independent (P)     Vocation Retired (P)     Leisure no activity (P)       Cognition   Behaviors Perseveration (P)    focused on pain and her complicated medical histroy     AROM   Overall AROM Comments lumbar ROM decreased 75% with  pain, hip ROM is very limited due to pain (P)       Strength   Overall Strength Comments 2+/5 of the right hip due to pain, very limited ability to give resistance with MMT due to pain (P)       Palpation   Palpation comment she is very tight in the upper traps, the thoracic and lumbar spine, she is very tight and tender in the right hip and quad (P)                       Objective measurements completed on examination: See above findings.       [facility] Adult PT Treatment/Exercise - 04/27/20 0001      Ambulation/Gait   Gait Comments very slow, significant antalgic on the right during stance, no device, very slow      Modalities   Modalities Electrical Stimulation;Moist Heat      Moist Heat Therapy   Number Minutes Moist Heat 10 Minutes    Moist Heat Location Hip      Electrical Stimulation   Electrical Stimulation Location right hip area    Electrical Stimulation Action IFC    Electrical Stimulation Parameters supine    Electrical Stimulation Goals Pain                    PT Short Term Goals - 04/27/20 1152      PT SHORT TERM GOAL #1   Title independent with an initial HEP    Time 4    Period Weeks    Status New             PT Long Term Goals - 04/27/20 1226      PT LONG TERM GOAL #1   Title Patient will report pain decrease to 6 or better out of 10 with functional activities to improve overall function    Baseline 10/10    Time 12    Period Weeks    Status New      PT LONG TERM GOAL #2   Title increase right LE strength to 4/5    Time 12    Period Weeks    Status New      PT LONG TERM GOAL #3   Title increase lumbar ROM 25%    Time 12    Period Weeks    Status  New      PT LONG TERM GOAL #4   Title walk with a minimal deviation    Time 12    Period Weeks    Status New                  Plan - 04/27/20 1148    Clinical Impression Statement Patient reports and focuses on a very complicated medical and personal  history, she seems to perseverate on this, she is the primary caregiver for her 57 year old grandson and at times seems to be overwhelmed, she was tearful multiple times during the history intake.  MRI showed scoliosis, mild DDD and some stenosis.  She really struggles walking, no device is used, very antalgic and winces in pain with stance phase on the right.  She is very limited with the exam due to pain, very limited ROM and strength due to pain levels.    Personal Factors and Comorbidities Age;Past/Current Experience;Time since onset of injury/illness/exacerbation;Comorbidity 2    Comorbidities brachial neuritis, migraines, anxiety, tremors    Examination-Activity Limitations Caring for Others;Carry;Lift;Sleep;Stand    Examination-Participation Restrictions Cleaning;Community Activity;Driving;Laundry;Meal Prep;Shop;Yard Work    Stability/Clinical Decision Making Evolving/Moderate complexity    Clinical Decision Making Moderate    Rehab Potential Fair    PT Frequency 2x / week    PT Duration 12 weeks    PT Treatment/Interventions ADLs/Self Care Home Management;Moist Heat;Therapeutic activities;Therapeutic exercise;Neuromuscular re-education;Patient/family education;Manual techniques;Passive range of motion;Joint Manipulations;Other (comment);Traction;Dry needling;Electrical Stimulation;Cryotherapy    PT Next Visit Plan see if we can start some gentle movements could be sitting or supine and work on the pain    Consulted and Agree with Plan of Care Patient           Patient will benefit from skilled therapeutic intervention in order to improve the following deficits and impairments:  Decreased activity tolerance,Decreased endurance,Decreased range of motion,Decreased strength,Pain,Impaired flexibility,Impaired tone,Postural dysfunction,Impaired sensation,Decreased balance,Improper body mechanics,Increased muscle spasms,Abnormal gait  Visit Diagnosis: Chronic bilateral low back pain without  sciatica - Plan: PT plan of care cert/re-cert  Pain in right hip - Plan: PT plan of care cert/re-cert  Acute pain of right knee - Plan: PT plan of care cert/re-cert  Difficulty in walking, not elsewhere classified - Plan: PT plan of care cert/re-cert  Cervicalgia - Plan: PT plan of care cert/re-cert  Muscle spasm of back - Plan: PT plan of care cert/re-cert     Problem List Patient Active Problem List   Diagnosis Date Noted  . Common migraine with intractable migraine 12/10/2019  . Chronic pain 08/13/2017  . Left groin pain 08/05/2017  . Occipital neuralgia of right side 10/21/2016  . Chronic migraine 01/26/2015  . Cervicogenic headache 12/16/2014  . Subclinical hypothyroidism 12/16/2014  . Memory loss 10/08/2014  . Depression 10/08/2014  . Generalized anxiety disorder 10/08/2014  . Tremor 10/08/2014  . Major depressive disorder, single episode, moderate (HCC) 08/27/2014  . Memory difficulties 08/27/2014  . Degenerative disc disease, cervical 08/27/2014  . Chronically on opiate therapy 08/27/2014    Jearld Lesch., PT 04/27/2020, 12:30 PM  [facility] [place] [facility] [address] [place], [place], [address] Phone: [phone]   Fax:  [phone]  Name: Toni [name] MRN: [id number] Date of Birth: January 28, 1955

## 2020-05-06 ENCOUNTER — Ambulatory Visit: Payer: Medicare Other | Attending: Neurosurgery

## 2020-05-06 ENCOUNTER — Other Ambulatory Visit: Payer: Self-pay

## 2020-05-06 DIAGNOSIS — R262 Difficulty in walking, not elsewhere classified: Secondary | ICD-10-CM | POA: Diagnosis present

## 2020-05-06 DIAGNOSIS — G4486 Cervicogenic headache: Secondary | ICD-10-CM

## 2020-05-06 DIAGNOSIS — M25551 Pain in right hip: Secondary | ICD-10-CM | POA: Diagnosis present

## 2020-05-06 DIAGNOSIS — M62830 Muscle spasm of back: Secondary | ICD-10-CM | POA: Insufficient documentation

## 2020-05-06 DIAGNOSIS — M25561 Pain in right knee: Secondary | ICD-10-CM | POA: Diagnosis present

## 2020-05-06 DIAGNOSIS — G8929 Other chronic pain: Secondary | ICD-10-CM | POA: Insufficient documentation

## 2020-05-06 DIAGNOSIS — M542 Cervicalgia: Secondary | ICD-10-CM | POA: Diagnosis present

## 2020-05-06 DIAGNOSIS — M5412 Radiculopathy, cervical region: Secondary | ICD-10-CM | POA: Diagnosis present

## 2020-05-06 DIAGNOSIS — M545 Low back pain, unspecified: Secondary | ICD-10-CM | POA: Insufficient documentation

## 2020-05-06 NOTE — Therapy (Signed)
[facility] [place] [facility] [address] [place], [place], [address] Phone: [phone]   Fax:  [phone]  Physical Therapy Treatment  Patient Details  Name: Toni [name] MRN: [id number] Date of Birth: 10-29-1955 Referring Provider (PT): Dr. Stephanie Acre   Encounter Date: 05/06/2020   PT End of Session - 05/06/20 1405    Visit Number 2    Date for PT Re-Evaluation 07/27/20    Authorization Type UHC medicare 10 visit medicare guidelines    Progress Note Due on Visit 10    PT Start Time 1315    PT Stop Time 1400    PT Time Calculation (min) 45 min    Activity Tolerance Patient tolerated treatment well    Behavior During Therapy [facility] for tasks assessed/performed           Past Medical History:  Diagnosis Date  . Brachial neuritis or radiculitis NOS   . Chronic hepatitis C (HCC) 10/05/2015  . Depressive disorder, not elsewhere classified   . Essential and other specified forms of tremor   . Fracture of 5th metatarsal 08/13/2017  . Fracture of multiple pubic rami (HCC) 08/13/2017  . Headache(784.0)   . Hepatitis C infection 12/05/2017   Treated and now resolved and "cured " as of early 2018  And     . Memory loss   . Microscopic hematuria    seed dr Patsi Sears in past  . Migraine, unspecified, without mention of intractable migraine without mention of status migrainosus   . Multiple rib fractures 08/13/2017  . Other specified disorders of rotator cuff syndrome of shoulder and allied disorders   . Vulvar pain    rx with valium supp    Past Surgical History:  Procedure Laterality Date  . BREAST EXCISIONAL BIOPSY Right    No scar  . CESAREAN SECTION     x3  . SHOULDER ARTHROSCOPY W/ ROTATOR CUFF REPAIR Right 2004  . SHOULDER ARTHROSCOPY W/ ROTATOR CUFF REPAIR Left 2008    There were no vitals filed for this visit.   Subjective Assessment - 05/06/20 1403    Subjective Pt returns with continued recollection of  complicated history and concerns about neck and back degeneration, fear of something being wrong with her hip. Also with alot of stress/concern regarding grandson who is under her care    Pertinent History brachial neuritis, migraines, anxiety, tremors    Limitations Sitting;Lifting;Standing;Walking;House hold activities;Other (comment)    How long can you sit comfortably? 10 min without readjusting    How long can you stand comfortably? 10 min but pain increases    Diagnostic tests MRI in February 2022    Patient Stated Goals have less pain    Currently in Pain? Yes    Pain Score 9     Pain Location --   Neck, right hip, back   Pain Onset More than a month ago                [facility] Adult PT Treatment/Exercise - 05/06/20 0001      Exercises   Exercises Lumbar;Knee/Hip;Neck      Lumbar Exercises: Stretches   Lower Trunk Rotation 10 seconds   15 bilateral   Pelvic Tilt 5 reps      Lumbar Exercises: Supine   Bridge 15 reps      Moist Heat Therapy   Number Minutes Moist Heat 10 Minutes    Moist Heat Location --   upper thoracic and cervical - skin  intact and WNL pre and post     Programme researcher, broadcasting/film/video Location right upper trap    Electrical Stimulation Action premod    Electrical Stimulation Parameters supine    Electrical Stimulation Goals Pain          Sit to stand x 5        PT Education - 05/06/20 1412    Education Details Alot of education provided regarding benefits of strengthening, mobility exercises to support joint health, improve pain and function. Discussed mobility through tolerated ranges, slow controlled motion as tolerated.    Person(s) Educated Patient    Comprehension Verbalized understanding            PT Short Term Goals - 04/27/20 1152      PT SHORT TERM GOAL #1   Title independent with an initial HEP    Time 4    Period Weeks    Status New             PT Long Term Goals - 04/27/20 1226      PT LONG  TERM GOAL #1   Title Patient will report pain decrease to 6 or better out of 10 with functional activities to improve overall function    Baseline 10/10    Time 12    Period Weeks    Status New      PT LONG TERM GOAL #2   Title increase right LE strength to 4/5    Time 12    Period Weeks    Status New      PT LONG TERM GOAL #3   Title increase lumbar ROM 25%    Time 12    Period Weeks    Status New      PT LONG TERM GOAL #4   Title walk with a minimal deviation    Time 12    Period Weeks    Status New                 Plan - 05/06/20 1405    Clinical Impression Statement Pt continues to be very perseverative about medical and personal history. She was very tearful when initially coming into treatment gym and was transitioned to small room (later expressing she would not like to be out in the gym). While speaking with PT she was willing to try gentle therex in supine. She may benefit from some pain neuro education, education about rehab goals and medical terminology, seems to have alot of fear surrounding medical diagnoses as well. Elected for estim and heat end of session for upper thoracic/neck pain and reported it helped.    Personal Factors and Comorbidities Age;Past/Current Experience;Time since onset of injury/illness/exacerbation;Comorbidity 2    Comorbidities brachial neuritis, migraines, anxiety, tremors    Examination-Activity Limitations Caring for Others;Carry;Lift;Sleep;Stand    Examination-Participation Restrictions Cleaning;Community Activity;Driving;Laundry;Meal Prep;Shop;Yard Work    Stability/Clinical Decision Making Evolving/Moderate complexity    Rehab Potential Fair    PT Frequency 2x / week    PT Duration 12 weeks    PT Treatment/Interventions ADLs/Self Care Home Management;Moist Heat;Therapeutic activities;Therapeutic exercise;Neuromuscular re-education;Patient/family education;Manual techniques;Passive range of motion;Joint Manipulations;Other  (comment);Traction;Dry needling;Electrical Stimulation;Cryotherapy    PT Next Visit Plan see if we can start some gentle movements could be sitting or supine and work on the pain.    Consulted and Agree with Plan of Care Patient           Patient will benefit from skilled therapeutic intervention in  order to improve the following deficits and impairments:  Decreased activity tolerance,Decreased endurance,Decreased range of motion,Decreased strength,Pain,Impaired flexibility,Impaired tone,Postural dysfunction,Impaired sensation,Decreased balance,Improper body mechanics,Increased muscle spasms,Abnormal gait  Visit Diagnosis: Chronic bilateral low back pain without sciatica  Pain in right hip  Acute pain of right knee  Difficulty in walking, not elsewhere classified  Cervicalgia  Muscle spasm of back  Cervicogenic headache  Radiculopathy, cervical region     Problem List Patient Active Problem List   Diagnosis Date Noted  . Common migraine with intractable migraine 12/10/2019  . Chronic pain 08/13/2017  . Left groin pain 08/05/2017  . Occipital neuralgia of right side 10/21/2016  . Chronic migraine 01/26/2015  . Cervicogenic headache 12/16/2014  . Subclinical hypothyroidism 12/16/2014  . Memory loss 10/08/2014  . Depression 10/08/2014  . Generalized anxiety disorder 10/08/2014  . Tremor 10/08/2014  . Major depressive disorder, single episode, moderate (HCC) 08/27/2014  . Memory difficulties 08/27/2014  . Degenerative disc disease, cervical 08/27/2014  . Chronically on opiate therapy 08/27/2014    Anson Crofts, PT, DPT 05/06/2020, 2:21 PM  [facility] [place] [facility] [address] [place], [place], [address] Phone: [phone]   Fax:  [phone]  Name: Toni [name] MRN: [id number] Date of Birth: 04/16/1955

## 2020-05-11 ENCOUNTER — Encounter: Payer: Self-pay | Admitting: Physical Therapy

## 2020-05-11 ENCOUNTER — Other Ambulatory Visit: Payer: Self-pay

## 2020-05-11 ENCOUNTER — Ambulatory Visit: Payer: Medicare Other | Admitting: Physical Therapy

## 2020-05-11 DIAGNOSIS — M545 Low back pain, unspecified: Secondary | ICD-10-CM

## 2020-05-11 DIAGNOSIS — M25561 Pain in right knee: Secondary | ICD-10-CM

## 2020-05-11 DIAGNOSIS — G8929 Other chronic pain: Secondary | ICD-10-CM

## 2020-05-11 DIAGNOSIS — R262 Difficulty in walking, not elsewhere classified: Secondary | ICD-10-CM

## 2020-05-11 DIAGNOSIS — G4486 Cervicogenic headache: Secondary | ICD-10-CM

## 2020-05-11 DIAGNOSIS — M25551 Pain in right hip: Secondary | ICD-10-CM

## 2020-05-11 DIAGNOSIS — M542 Cervicalgia: Secondary | ICD-10-CM

## 2020-05-11 DIAGNOSIS — M62830 Muscle spasm of back: Secondary | ICD-10-CM

## 2020-05-11 DIAGNOSIS — M5412 Radiculopathy, cervical region: Secondary | ICD-10-CM

## 2020-05-11 NOTE — Therapy (Signed)
[facility] [place] [facility] [address] [place], [place], [address] Phone: [phone]   Fax:  [phone]  Physical Therapy Treatment  Patient Details  Name: Toni [name] MRN: [id number] Date of Birth: August 06, 1955 Referring Provider (PT): Dr. Stephanie Acre   Encounter Date: 05/11/2020   PT End of Session - 05/11/20 1535    Visit Number 3    Date for PT Re-Evaluation 07/27/20    Authorization Type UHC medicare 10 visit medicare guidelines    PT Start Time 1450    PT Stop Time 1547    PT Time Calculation (min) 57 min    Activity Tolerance Patient tolerated treatment well    Behavior During Therapy [facility] for tasks assessed/performed           Past Medical History:  Diagnosis Date  . Brachial neuritis or radiculitis NOS   . Chronic hepatitis C (HCC) 10/05/2015  . Depressive disorder, not elsewhere classified   . Essential and other specified forms of tremor   . Fracture of 5th metatarsal 08/13/2017  . Fracture of multiple pubic rami (HCC) 08/13/2017  . Headache(784.0)   . Hepatitis C infection 12/05/2017   Treated and now resolved and "cured " as of early 2018  And     . Memory loss   . Microscopic hematuria    seed dr Patsi Sears in past  . Migraine, unspecified, without mention of intractable migraine without mention of status migrainosus   . Multiple rib fractures 08/13/2017  . Other specified disorders of rotator cuff syndrome of shoulder and allied disorders   . Vulvar pain    rx with valium supp    Past Surgical History:  Procedure Laterality Date  . BREAST EXCISIONAL BIOPSY Right    No scar  . CESAREAN SECTION     x3  . SHOULDER ARTHROSCOPY W/ ROTATOR CUFF REPAIR Right 2004  . SHOULDER ARTHROSCOPY W/ ROTATOR CUFF REPAIR Left 2008    There were no vitals filed for this visit.   Subjective Assessment - 05/11/20 1517    Subjective Patient appears to be less anxious today, reports feeling a little better overall     Currently in Pain? Yes    Pain Score 8     Pain Location Back   neck                            [facility] Adult PT Treatment/Exercise - 05/11/20 0001      Neck Exercises: Theraband   Shoulder External Rotation 20 reps    Shoulder External Rotation Limitations yellow tband      Neck Exercises: Seated   Other Seated Exercise ball b/n knees squeeze, light ball reach up to head high, yellow tband HS curls, LAQ's, ankle pumps      Lumbar Exercises: Stretches   Passive Hamstring Stretch Right;Left;4 reps;10 seconds    Piriformis Stretch Right;Left;4 reps;20 seconds      Lumbar Exercises: Supine   Other Supine Lumbar Exercises feet on ball K2C, trunk rotation, small bridges and isometric abs      Moist Heat Therapy   Number Minutes Moist Heat 10 Minutes    Moist Heat Location Lumbar Spine;Cervical      Electrical Stimulation   Electrical Stimulation Location right upper trap    Electrical Stimulation Action IFC    Electrical Stimulation Parameters supine    Electrical Stimulation Goals Pain  PT Short Term Goals - 05/11/20 1541      PT SHORT TERM GOAL #1   Title independent with an initial HEP    Status On-going             PT Long Term Goals - 04/27/20 1226      PT LONG TERM GOAL #1   Title Patient will report pain decrease to 6 or better out of 10 with functional activities to improve overall function    Baseline 10/10    Time 12    Period Weeks    Status New      PT LONG TERM GOAL #2   Title increase right LE strength to 4/5    Time 12    Period Weeks    Status New      PT LONG TERM GOAL #3   Title increase lumbar ROM 25%    Time 12    Period Weeks    Status New      PT LONG TERM GOAL #4   Title walk with a minimal deviation    Time 12    Period Weeks    Status New                 Plan - 05/11/20 1540    Clinical Impression Statement Patient seems to be a little less stresses and anxious today,  she is agreeable to exercises but needed cues for all to do correctly.  Had some head shooting pains with some things but reported to me that is normal and we continued    PT Next Visit Plan see if we can start some gentle movements could be sitting or supine and work on the pain.    Consulted and Agree with Plan of Care Patient           Patient will benefit from skilled therapeutic intervention in order to improve the following deficits and impairments:  Decreased activity tolerance,Decreased endurance,Decreased range of motion,Decreased strength,Pain,Impaired flexibility,Impaired tone,Postural dysfunction,Impaired sensation,Decreased balance,Improper body mechanics,Increased muscle spasms,Abnormal gait  Visit Diagnosis: Chronic bilateral low back pain without sciatica  Pain in right hip  Acute pain of right knee  Difficulty in walking, not elsewhere classified  Cervicalgia  Muscle spasm of back  Cervicogenic headache  Radiculopathy, cervical region     Problem List Patient Active Problem List   Diagnosis Date Noted  . Common migraine with intractable migraine 12/10/2019  . Chronic pain 08/13/2017  . Left groin pain 08/05/2017  . Occipital neuralgia of right side 10/21/2016  . Chronic migraine 01/26/2015  . Cervicogenic headache 12/16/2014  . Subclinical hypothyroidism 12/16/2014  . Memory loss 10/08/2014  . Depression 10/08/2014  . Generalized anxiety disorder 10/08/2014  . Tremor 10/08/2014  . Major depressive disorder, single episode, moderate (HCC) 08/27/2014  . Memory difficulties 08/27/2014  . Degenerative disc disease, cervical 08/27/2014  . Chronically on opiate therapy 08/27/2014    Jearld Lesch., PT 05/11/2020, 4:11 PM  [facility] [place] [facility] [address] [place], [place], [address] Phone: [phone]   Fax:  [phone]  Name: Toni [name] MRN: [id number] Date of Birth: 05-24-1955

## 2020-05-13 ENCOUNTER — Ambulatory Visit: Payer: Medicare Other | Admitting: Physical Therapy

## 2020-05-18 ENCOUNTER — Ambulatory Visit: Payer: Medicare Other | Admitting: Physical Therapy

## 2020-05-20 ENCOUNTER — Encounter: Payer: Self-pay | Admitting: Physical Therapy

## 2020-05-20 ENCOUNTER — Ambulatory Visit: Payer: Medicare Other | Admitting: Physical Therapy

## 2020-05-20 ENCOUNTER — Other Ambulatory Visit: Payer: Self-pay

## 2020-05-20 DIAGNOSIS — M62830 Muscle spasm of back: Secondary | ICD-10-CM

## 2020-05-20 DIAGNOSIS — R262 Difficulty in walking, not elsewhere classified: Secondary | ICD-10-CM

## 2020-05-20 DIAGNOSIS — M25561 Pain in right knee: Secondary | ICD-10-CM

## 2020-05-20 DIAGNOSIS — M545 Low back pain, unspecified: Secondary | ICD-10-CM

## 2020-05-20 DIAGNOSIS — G8929 Other chronic pain: Secondary | ICD-10-CM

## 2020-05-20 DIAGNOSIS — M25551 Pain in right hip: Secondary | ICD-10-CM

## 2020-05-20 DIAGNOSIS — M542 Cervicalgia: Secondary | ICD-10-CM

## 2020-05-20 DIAGNOSIS — G4486 Cervicogenic headache: Secondary | ICD-10-CM

## 2020-05-20 DIAGNOSIS — M5412 Radiculopathy, cervical region: Secondary | ICD-10-CM

## 2020-05-20 NOTE — Therapy (Signed)
[place]. [place], [place], [address] Phone: [phone]   Fax:  [phone]  Physical Therapy Treatment  Patient Details  Name: Toni [name] MRN: [id number] Date of Birth: 04/11/1955 Referring Provider (PT): Dr. Margette Fast   Encounter Date: 05/20/2020   PT End of Session - 05/20/20 0921    Visit Number 4    Date for PT Re-Evaluation 07/27/20    Authorization Type UHC medicare 10 visit medicare guidelines    PT Start Time 0845    PT Stop Time 0942    PT Time Calculation (min) 57 min    Activity Tolerance Patient tolerated treatment well    Behavior During Therapy [facility] for tasks assessed/performed           Past Medical History:  Diagnosis Date  . Brachial neuritis or radiculitis NOS   . Chronic hepatitis C ([place]) 10/05/2015  . Depressive disorder, not elsewhere classified   . Essential and other specified forms of tremor   . Fracture of 5th metatarsal 08/13/2017  . Fracture of multiple pubic rami ([place]) 08/13/2017  . Headache(784.0)   . Hepatitis C infection 12/05/2017   Treated and now resolved and "cured " as of early 2018  And     . Memory loss   . Microscopic hematuria    seed dr Gaynelle Arabian in past  . Migraine, unspecified, without mention of intractable migraine without mention of status migrainosus   . Multiple rib fractures 08/13/2017  . Other specified disorders of rotator cuff syndrome of shoulder and allied disorders   . Vulvar pain    rx with valium supp    Past Surgical History:  Procedure Laterality Date  . BREAST EXCISIONAL BIOPSY Right    No scar  . CESAREAN SECTION     x3  . SHOULDER ARTHROSCOPY W/ ROTATOR CUFF REPAIR Right 2004  . SHOULDER ARTHROSCOPY W/ ROTATOR CUFF REPAIR Left 2008    There were no vitals filed for this visit.   Subjective Assessment - 05/20/20 0852    Subjective Reports that she has missed appointment due to wrong alarm set and that this  morning her grandson missed the bus and she had tor drive him    Currently in Pain? Yes    Pain Score 8     Pain Location Back   neck   Aggravating Factors  everything hurts, reports difficulty sleeping    Pain Relieving Factors gabapentin helps                             [facility] Adult PT Treatment/Exercise - 05/20/20 0001      Neck Exercises: Theraband   Shoulder Extension 20 reps    Shoulder Extension Limitations yellow tband    Rows 20 reps    Rows Limitations yellow tband    Shoulder External Rotation 20 reps    Shoulder External Rotation Limitations yellow tband      Lumbar Exercises: Seated   Other Seated Lumbar Exercises yellow tband HS curls, toe and heel raises    Other Seated Lumbar Exercises small marches      Lumbar Exercises: Supine   Other Supine Lumbar Exercises feet on ball K2C, trunk rotation, small bridges and isometric abs      Moist Heat Therapy   Number Minutes Moist Heat 12 Minutes    Moist Heat Location Lumbar Spine;Cervical      Electrical Stimulation  Electrical Stimulation Location right upper trap    Electrical Stimulation Action IFC    Electrical Stimulation Parameters supine    Electrical Stimulation Goals Pain                    PT Short Term Goals - 05/20/20 0924      PT SHORT TERM GOAL #1   Title independent with an initial HEP    Status Partially Met             PT Long Term Goals - 05/20/20 0924      PT LONG TERM GOAL #1   Title Patient will report pain decrease to 6 or better out of 10 with functional activities to improve overall function    Status On-going      PT LONG TERM GOAL #2   Title increase right LE strength to 4/5    Status On-going                 Plan - 05/20/20 0922    Clinical Impression Statement Patient very slow and needs a lot of cues to help get her in the positions and do the exercises.  She has a lot of pain and difficulty getting in positions and doing the  exercises.  Most movements increase pain, spent some time today explaining the POC and how that movements and strength will help support the spine and the joints for less pain and better function    PT Next Visit Plan continue to work on movements    Consulted and Agree with Plan of Care Patient           Patient will benefit from skilled therapeutic intervention in order to improve the following deficits and impairments:  Decreased activity tolerance,Decreased endurance,Decreased range of motion,Decreased strength,Pain,Impaired flexibility,Impaired tone,Postural dysfunction,Impaired sensation,Decreased balance,Improper body mechanics,Increased muscle spasms,Abnormal gait  Visit Diagnosis: Chronic bilateral low back pain without sciatica  Pain in right hip  Acute pain of right knee  Difficulty in walking, not elsewhere classified  Cervicalgia  Muscle spasm of back  Cervicogenic headache  Radiculopathy, cervical region     Problem List Patient Active Problem List   Diagnosis Date Noted  . Common migraine with intractable migraine 12/10/2019  . Chronic pain 08/13/2017  . Left groin pain 08/05/2017  . Occipital neuralgia of right side 10/21/2016  . Chronic migraine 01/26/2015  . Cervicogenic headache 12/16/2014  . Subclinical hypothyroidism 12/16/2014  . Memory loss 10/08/2014  . Depression 10/08/2014  . Generalized anxiety disorder 10/08/2014  . Tremor 10/08/2014  . Major depressive disorder, single episode, moderate ([place]) 08/27/2014  . Memory difficulties 08/27/2014  . Degenerative disc disease, cervical 08/27/2014  . Chronically on opiate therapy 08/27/2014    Sumner Boast., PT 05/20/2020, 9:24 AM  [place]. [place], [place], [address] Phone: [phone]   Fax:  [phone]  Name: Toni [name] MRN: [id number] Date of Birth: 03-31-1955

## 2020-05-29 ENCOUNTER — Other Ambulatory Visit: Payer: Self-pay

## 2020-05-29 ENCOUNTER — Ambulatory Visit: Payer: Medicare Other | Admitting: Physical Therapy

## 2020-05-29 ENCOUNTER — Encounter: Payer: Self-pay | Admitting: Physical Therapy

## 2020-05-29 DIAGNOSIS — M542 Cervicalgia: Secondary | ICD-10-CM

## 2020-05-29 DIAGNOSIS — G8929 Other chronic pain: Secondary | ICD-10-CM

## 2020-05-29 DIAGNOSIS — M545 Low back pain, unspecified: Secondary | ICD-10-CM | POA: Diagnosis not present

## 2020-05-29 DIAGNOSIS — M62830 Muscle spasm of back: Secondary | ICD-10-CM

## 2020-05-29 DIAGNOSIS — M25561 Pain in right knee: Secondary | ICD-10-CM

## 2020-05-29 DIAGNOSIS — M25551 Pain in right hip: Secondary | ICD-10-CM

## 2020-05-29 DIAGNOSIS — G4486 Cervicogenic headache: Secondary | ICD-10-CM

## 2020-05-29 DIAGNOSIS — R262 Difficulty in walking, not elsewhere classified: Secondary | ICD-10-CM

## 2020-05-29 DIAGNOSIS — M5412 Radiculopathy, cervical region: Secondary | ICD-10-CM

## 2020-05-29 NOTE — Therapy (Signed)
[facility] [place] [address]. [place], [place], [address] Phone: [phone]   Fax:  [phone]  Physical Therapy Treatment  Patient Details  Name: Toni [name] MRN: [id number] Date of Birth: 04-May-1955 Referring Provider (PT): Dr. Stephanie Acre   Encounter Date: 05/29/2020   PT End of Session - 05/29/20 0921    Visit Number 5    Date for PT Re-Evaluation 07/27/20    Authorization Type UHC medicare 10 visit medicare guidelines    PT Start Time 0845    PT Stop Time 0935    PT Time Calculation (min) 50 min    Activity Tolerance Patient tolerated treatment well    Behavior During Therapy [facility] for tasks assessed/performed           Past Medical History:  Diagnosis Date  . Brachial neuritis or radiculitis NOS   . Chronic hepatitis C ([facility]) 10/05/2015  . Depressive disorder, not elsewhere classified   . Essential and other specified forms of tremor   . Fracture of 5th metatarsal 08/13/2017  . Fracture of multiple pubic rami ([facility]) 08/13/2017  . Headache(784.0)   . Hepatitis C infection 12/05/2017   Treated and now resolved and "cured " as of early 2018  And     . Memory loss   . Microscopic hematuria    seed dr Patsi Sears in past  . Migraine, unspecified, without mention of intractable migraine without mention of status migrainosus   . Multiple rib fractures 08/13/2017  . Other specified disorders of rotator cuff syndrome of shoulder and allied disorders   . Vulvar pain    rx with valium supp    Past Surgical History:  Procedure Laterality Date  . BREAST EXCISIONAL BIOPSY Right    No scar  . CESAREAN SECTION     x3  . SHOULDER ARTHROSCOPY W/ ROTATOR CUFF REPAIR Right 2004  . SHOULDER ARTHROSCOPY W/ ROTATOR CUFF REPAIR Left 2008    There were no vitals filed for this visit.   Subjective Assessment - 05/29/20 0849    Subjective Patient reports a lot of pain in the right upper trap and neck area and the low  back, she reports doing some cleaning and organizing recently    Currently in Pain? Yes    Pain Score 8     Pain Location Neck    Pain Orientation Right    Pain Descriptors / Indicators Aching    Aggravating Factors  cleaning                             [facility] Adult PT Treatment/Exercise - 05/29/20 0001      Moist Heat Therapy   Number Minutes Moist Heat 12 Minutes    Moist Heat Location Lumbar Spine;Cervical      Electrical Stimulation   Electrical Stimulation Location right upper trap    Electrical Stimulation Action IFC    Electrical Stimulation Parameters supine    Electrical Stimulation Goals Pain      Manual Therapy   Manual Therapy Soft tissue mobilization    Soft tissue mobilization right cervical, upper trap, rhomboid and into the low back area in sitting                    PT Short Term Goals - 05/29/20 4193      PT SHORT TERM GOAL #1   Title independent with an initial HEP    Status  Achieved             PT Long Term Goals - 05/20/20 0924      PT LONG TERM GOAL #1   Title Patient will report pain decrease to 6 or better out of 10 with functional activities to improve overall function    Status On-going      PT LONG TERM GOAL #2   Title increase right LE strength to 4/5    Status On-going                 Plan - 05/29/20 0922    Clinical Impression Statement I decided to try some STM and she was extremely tight and sore in the right cervical area, had a large knot in the right upper trap and the right rhomboid area, she was tender but tolerated this well without much issues    PT Next Visit Plan continue to work on movements, see if the STM helped    Consulted and Agree with Plan of Care Patient           Patient will benefit from skilled therapeutic intervention in order to improve the following deficits and impairments:  Decreased activity tolerance,Decreased endurance,Decreased range of motion,Decreased  strength,Pain,Impaired flexibility,Impaired tone,Postural dysfunction,Impaired sensation,Decreased balance,Improper body mechanics,Increased muscle spasms,Abnormal gait  Visit Diagnosis: Chronic bilateral low back pain without sciatica  Pain in right hip  Acute pain of right knee  Difficulty in walking, not elsewhere classified  Cervicalgia  Muscle spasm of back  Cervicogenic headache  Radiculopathy, cervical region     Problem List Patient Active Problem List   Diagnosis Date Noted  . Common migraine with intractable migraine 12/10/2019  . Chronic pain 08/13/2017  . Left groin pain 08/05/2017  . Occipital neuralgia of right side 10/21/2016  . Chronic migraine 01/26/2015  . Cervicogenic headache 12/16/2014  . Subclinical hypothyroidism 12/16/2014  . Memory loss 10/08/2014  . Depression 10/08/2014  . Generalized anxiety disorder 10/08/2014  . Tremor 10/08/2014  . Major depressive disorder, single episode, moderate ([facility]) 08/27/2014  . Memory difficulties 08/27/2014  . Degenerative disc disease, cervical 08/27/2014  . Chronically on opiate therapy 08/27/2014    Jearld Lesch., PT 05/29/2020, 9:24 AM  [facility] [place] [address]. [place], [place], [address] Phone: [phone]   Fax:  [phone]  Name: Toni [name] MRN: [id number] Date of Birth: 02/22/1955

## 2020-06-05 ENCOUNTER — Other Ambulatory Visit: Payer: Self-pay

## 2020-06-05 ENCOUNTER — Encounter: Payer: Self-pay | Admitting: Physical Therapy

## 2020-06-05 ENCOUNTER — Ambulatory Visit: Payer: Medicare Other | Attending: Neurosurgery | Admitting: Physical Therapy

## 2020-06-05 DIAGNOSIS — G4486 Cervicogenic headache: Secondary | ICD-10-CM | POA: Diagnosis present

## 2020-06-05 DIAGNOSIS — G8929 Other chronic pain: Secondary | ICD-10-CM | POA: Diagnosis present

## 2020-06-05 DIAGNOSIS — R262 Difficulty in walking, not elsewhere classified: Secondary | ICD-10-CM

## 2020-06-05 DIAGNOSIS — M545 Low back pain, unspecified: Secondary | ICD-10-CM | POA: Diagnosis present

## 2020-06-05 DIAGNOSIS — M25561 Pain in right knee: Secondary | ICD-10-CM

## 2020-06-05 DIAGNOSIS — M25551 Pain in right hip: Secondary | ICD-10-CM

## 2020-06-05 DIAGNOSIS — M5412 Radiculopathy, cervical region: Secondary | ICD-10-CM

## 2020-06-05 DIAGNOSIS — M62830 Muscle spasm of back: Secondary | ICD-10-CM

## 2020-06-05 DIAGNOSIS — M542 Cervicalgia: Secondary | ICD-10-CM | POA: Diagnosis present

## 2020-06-05 NOTE — Therapy (Signed)
[facility] [place] [address]. [place], [place], [address] Phone: [phone]   Fax:  [phone]  Physical Therapy Treatment  Patient Details  Name: Toni [name] MRN: [id number] Date of Birth: 03-10-1955 Referring Provider (PT): Dr. Stephanie Acre   Encounter Date: 06/05/2020   PT End of Session - 06/05/20 0916    Visit Number 6    Date for PT Re-Evaluation 07/27/20    Authorization Type UHC medicare 10 visit medicare guidelines    PT Start Time [phone]    PT Stop Time 0933    PT Time Calculation (min) 50 min    Activity Tolerance Patient tolerated treatment well    Behavior During Therapy [facility] for tasks assessed/performed           Past Medical History:  Diagnosis Date  . Brachial neuritis or radiculitis NOS   . Chronic hepatitis C ([facility]) 10/05/2015  . Depressive disorder, not elsewhere classified   . Essential and other specified forms of tremor   . Fracture of 5th metatarsal 08/13/2017  . Fracture of multiple pubic rami ([facility]) 08/13/2017  . Headache(784.0)   . Hepatitis C infection 12/05/2017   Treated and now resolved and "cured " as of early 2018  And     . Memory loss   . Microscopic hematuria    seed dr Patsi Sears in past  . Migraine, unspecified, without mention of intractable migraine without mention of status migrainosus   . Multiple rib fractures 08/13/2017  . Other specified disorders of rotator cuff syndrome of shoulder and allied disorders   . Vulvar pain    rx with valium supp    Past Surgical History:  Procedure Laterality Date  . BREAST EXCISIONAL BIOPSY Right    No scar  . CESAREAN SECTION     x3  . SHOULDER ARTHROSCOPY W/ ROTATOR CUFF REPAIR Right 2004  . SHOULDER ARTHROSCOPY W/ ROTATOR CUFF REPAIR Left 2008    There were no vitals filed for this visit.   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 Yes    Pain Score 9     Pain Location Neck    Pain Orientation Right    Pain Descriptors / Indicators Spasm;Tightness    Aggravating Factors  standing, doing more stuff                             [facility] Adult PT Treatment/Exercise - 06/05/20 0001      Moist Heat Therapy   Number Minutes Moist Heat 12 Minutes    Moist Heat Location Lumbar Spine;Cervical      Electrical Stimulation   Electrical Stimulation Location right upper trap    Electrical Stimulation Action IFC    Electrical Stimulation Parameters supine    Electrical Stimulation Goals Pain      Manual Therapy   Manual Therapy Soft tissue mobilization;Manual Traction    Manual therapy comments some PROM with manual upper trap and levator stretches    Soft tissue mobilization right cervical, upper trap, rhomboid and into the low back area in sitting    Manual Traction occipital elease                    PT Short Term Goals - 05/29/20  1694      PT SHORT TERM GOAL #1   Title independent with an initial HEP    Status Achieved             PT Long Term Goals - 06/05/20 5038      PT LONG TERM GOAL #1   Title Patient will report pain decrease to 6 or better out of 10 with functional activities to improve overall function    Status On-going      PT LONG TERM GOAL #2   Title increase right LE strength to 4/5    Status On-going      PT LONG TERM GOAL #3   Title increase lumbar ROM 25%    Status On-going      PT LONG TERM GOAL #4   Title walk with a minimal deviation    Status On-going                 Plan - 06/05/20 0917    Clinical Impression Statement I worked a little more on the upper trap and the cervical spasms, I added some cervical distraction and occipital release with some manual stretches.  She does have a lot of tightness and spasms , does report that she was very active yesterday     PT Next Visit Plan continue to work on movements, see if the STM helped    Consulted and Agree with Plan of Care Patient           Patient will benefit from skilled therapeutic intervention in order to improve the following deficits and impairments:  Decreased activity tolerance,Decreased endurance,Decreased range of motion,Decreased strength,Pain,Impaired flexibility,Impaired tone,Postural dysfunction,Impaired sensation,Decreased balance,Improper body mechanics,Increased muscle spasms,Abnormal gait  Visit Diagnosis: Chronic bilateral low back pain without sciatica  Pain in right hip  Acute pain of right knee  Difficulty in walking, not elsewhere classified  Cervicalgia  Muscle spasm of back  Cervicogenic headache  Radiculopathy, cervical region     Problem List Patient Active Problem List   Diagnosis Date Noted  . Common migraine with intractable migraine 12/10/2019  . Chronic pain 08/13/2017  . Left groin pain 08/05/2017  . Occipital neuralgia of right side 10/21/2016  . Chronic migraine 01/26/2015  . Cervicogenic headache 12/16/2014  . Subclinical hypothyroidism 12/16/2014  . Memory loss 10/08/2014  . Depression 10/08/2014  . Generalized anxiety disorder 10/08/2014  . Tremor 10/08/2014  . Major depressive disorder, single episode, moderate ([facility]) 08/27/2014  . Memory difficulties 08/27/2014  . Degenerative disc disease, cervical 08/27/2014  . Chronically on opiate therapy 08/27/2014    Jearld Lesch., PT 06/05/2020, 9:19 AM  [facility] [place] [facility] [address]. [place], [place], [address] Phone: [phone]   Fax:  [phone]  Name: Toni [name] MRN: [id number] Date of Birth: 1955-05-05

## 2020-06-08 ENCOUNTER — Ambulatory Visit: Payer: Medicare Other | Admitting: Physical Therapy

## 2020-06-10 ENCOUNTER — Encounter: Payer: Self-pay | Admitting: Physical Therapy

## 2020-06-10 ENCOUNTER — Ambulatory Visit: Payer: Medicare Other | Admitting: Physical Therapy

## 2020-06-10 ENCOUNTER — Other Ambulatory Visit: Payer: Self-pay

## 2020-06-10 DIAGNOSIS — M545 Low back pain, unspecified: Secondary | ICD-10-CM

## 2020-06-10 DIAGNOSIS — M25561 Pain in right knee: Secondary | ICD-10-CM

## 2020-06-10 DIAGNOSIS — M542 Cervicalgia: Secondary | ICD-10-CM

## 2020-06-10 DIAGNOSIS — G8929 Other chronic pain: Secondary | ICD-10-CM

## 2020-06-10 DIAGNOSIS — M62830 Muscle spasm of back: Secondary | ICD-10-CM

## 2020-06-10 DIAGNOSIS — R262 Difficulty in walking, not elsewhere classified: Secondary | ICD-10-CM

## 2020-06-10 DIAGNOSIS — G4486 Cervicogenic headache: Secondary | ICD-10-CM

## 2020-06-10 DIAGNOSIS — M25551 Pain in right hip: Secondary | ICD-10-CM

## 2020-06-10 DIAGNOSIS — M5412 Radiculopathy, cervical region: Secondary | ICD-10-CM

## 2020-06-10 NOTE — Therapy (Signed)
[facility] [place] [address] [place], [place], [address] Phone: [phone]   Fax:  [phone]  Physical Therapy Treatment  Patient Details  Name: Toni [name] MRN: [id number] Date of Birth: 1955/10/17 Referring Provider (PT): Dr. Stephanie Acre   Encounter Date: 06/10/2020   PT End of Session - 06/10/20 1534    Visit Number 7    Date for PT Re-Evaluation 07/27/20    Authorization Type UHC medicare 10 visit medicare guidelines    PT Start Time 1449    PT Stop Time 1540    PT Time Calculation (min) 51 min    Activity Tolerance Patient tolerated treatment well    Behavior During Therapy [facility] for tasks assessed/performed           Past Medical History:  Diagnosis Date  . Brachial neuritis or radiculitis NOS   . Chronic hepatitis C ([facility]CC) 10/05/2015  . Depressive disorder, not elsewhere classified   . Essential and other specified forms of tremor   . Fracture of 5th metatarsal 08/13/2017  . Fracture of multiple pubic rami (HCC) 08/13/2017  . Headache(784.0)   . Hepatitis C infection 12/05/2017   Treated and now resolved and "cured " as of early 2018  And     . Memory loss   . Microscopic hematuria    seed dr Patsi Sears in past  . Migraine, unspecified, without mention of intractable migraine without mention of status migrainosus   . Multiple rib fractures 08/13/2017  . Other specified disorders of rotator cuff syndrome of shoulder and allied disorders   . Vulvar pain    rx with valium supp    Past Surgical History:  Procedure Laterality Date  . BREAST EXCISIONAL BIOPSY Right    No scar  . CESAREAN SECTION     x3  . SHOULDER ARTHROSCOPY W/ ROTATOR CUFF REPAIR Right 2004  . SHOULDER ARTHROSCOPY W/ ROTATOR CUFF REPAIR Left 2008    There were no vitals filed for this visit.   Subjective Assessment - 06/10/20 1458    Subjective Patient reports that after the last treatmentshe was feeling very good for a  while.    Currently in Pain? Yes    Pain Score 7     Pain Location Neck    Pain Orientation Right    Pain Descriptors / Indicators Spasm;Tightness    Pain Relieving Factors what you did last time really felt good                             [facility] Adult PT Treatment/Exercise - 06/10/20 0001      Neck Exercises: Seated   Neck Retraction 10 reps    W Back 5 reps    Other Seated Exercise shrugs    Other Seated Exercise scapular retractions      Moist Heat Therapy   Number Minutes Moist Heat 12 Minutes    Moist Heat Location Lumbar Spine;Cervical      Electrical Stimulation   Electrical Stimulation Location right upper trap    Electrical Stimulation Action IFC    Electrical Stimulation Parameters supine    Electrical Stimulation Goals Pain      Manual Therapy   Manual Therapy Soft tissue mobilization;Manual Traction    Manual therapy comments some PROM with manual upper trap and levator stretches    Soft tissue mobilization right cervical, upper trap, rhomboid and into the low back area in sitting  Manual Traction occipital elease                    PT Short Term Goals - 05/29/20 1025      PT SHORT TERM GOAL #1   Title independent with an initial HEP    Status Achieved             PT Long Term Goals - 06/10/20 1620      PT LONG TERM GOAL #1   Title Patient will report pain decrease to 6 or better out of 10 with functional activities to improve overall function    Status On-going      PT LONG TERM GOAL #2   Title increase right LE strength to 4/5    Status On-going                 Plan - 06/10/20 1534    Clinical Impression Statement Patient reports that she had very good relief after last time with increase cervical motion and less pain  She still has significant right upper trap and the right side of the neck, there was also a tick on the right side of her neck, I was able to remove it with the head and all.    PT Next  Visit Plan continue to add exercises as she tolerates    Consulted and Agree with Plan of Care Patient           Patient will benefit from skilled therapeutic intervention in order to improve the following deficits and impairments:  Decreased activity tolerance,Decreased endurance,Decreased range of motion,Decreased strength,Pain,Impaired flexibility,Impaired tone,Postural dysfunction,Impaired sensation,Decreased balance,Improper body mechanics,Increased muscle spasms,Abnormal gait  Visit Diagnosis: Chronic bilateral low back pain without sciatica  Pain in right hip  Acute pain of right knee  Difficulty in walking, not elsewhere classified  Cervicalgia  Muscle spasm of back  Cervicogenic headache  Radiculopathy, cervical region     Problem List Patient Active Problem List   Diagnosis Date Noted  . Common migraine with intractable migraine 12/10/2019  . Chronic pain 08/13/2017  . Left groin pain 08/05/2017  . Occipital neuralgia of right side 10/21/2016  . Chronic migraine 01/26/2015  . Cervicogenic headache 12/16/2014  . Subclinical hypothyroidism 12/16/2014  . Memory loss 10/08/2014  . Depression 10/08/2014  . Generalized anxiety disorder 10/08/2014  . Tremor 10/08/2014  . Major depressive disorder, single episode, moderate ([facility]) 08/27/2014  . Memory difficulties 08/27/2014  . Degenerative disc disease, cervical 08/27/2014  . Chronically on opiate therapy 08/27/2014    Jearld Lesch., PT 06/10/2020, 4:30 PM  [facility] [place] [address] [place], [place], [address] Phone: [phone]   Fax:  [phone]  Name: Toni [name] MRN: [id number] Date of Birth: Mar 31, 1955

## 2020-06-15 ENCOUNTER — Ambulatory Visit: Payer: Medicare Other | Admitting: Physical Therapy

## 2020-06-15 ENCOUNTER — Other Ambulatory Visit: Payer: Self-pay

## 2020-06-15 ENCOUNTER — Encounter: Payer: Self-pay | Admitting: Physical Therapy

## 2020-06-15 DIAGNOSIS — R262 Difficulty in walking, not elsewhere classified: Secondary | ICD-10-CM

## 2020-06-15 DIAGNOSIS — M542 Cervicalgia: Secondary | ICD-10-CM

## 2020-06-15 DIAGNOSIS — G4486 Cervicogenic headache: Secondary | ICD-10-CM

## 2020-06-15 DIAGNOSIS — M545 Low back pain, unspecified: Secondary | ICD-10-CM | POA: Diagnosis not present

## 2020-06-15 DIAGNOSIS — M25561 Pain in right knee: Secondary | ICD-10-CM

## 2020-06-15 DIAGNOSIS — M25551 Pain in right hip: Secondary | ICD-10-CM

## 2020-06-15 DIAGNOSIS — M5412 Radiculopathy, cervical region: Secondary | ICD-10-CM

## 2020-06-15 DIAGNOSIS — M62830 Muscle spasm of back: Secondary | ICD-10-CM

## 2020-06-15 DIAGNOSIS — G8929 Other chronic pain: Secondary | ICD-10-CM

## 2020-06-15 NOTE — Therapy (Signed)
[facility] [place] [facility] [address] [place], [place], [address] Phone: [phone]   Fax:  [phone]  Physical Therapy Treatment  Patient Details  Name: Toni [name] MRN: [id number] Date of Birth: Oct 29, 1955 Referring Provider (PT): Dr. Stephanie Acre   Encounter Date: 06/15/2020   PT End of Session - 06/15/20 1522     Visit Number 8    Date for PT Re-Evaluation 07/27/20    Authorization Type UHC medicare 10 visit medicare guidelines    PT Start Time 1444    PT Stop Time 1533    PT Time Calculation (min) 49 min    Activity Tolerance Patient tolerated treatment well    Behavior During Therapy [facility] for tasks assessed/performed             Past Medical History:  Diagnosis Date   Brachial neuritis or radiculitis NOS    Chronic hepatitis C (HCC) 10/05/2015   Depressive disorder, not elsewhere classified    Essential and other specified forms of tremor    Fracture of 5th metatarsal 08/13/2017   Fracture of multiple pubic rami (HCC) 08/13/2017   Headache(784.0)    Hepatitis C infection 12/05/2017   Treated and now resolved and "cured " as of early 2018  And      Memory loss    Microscopic hematuria    seed dr Patsi Sears in past   Migraine, unspecified, without mention of intractable migraine without mention of status migrainosus    Multiple rib fractures 08/13/2017   Other specified disorders of rotator cuff syndrome of shoulder and allied disorders    Vulvar pain    rx with valium supp    Past Surgical History:  Procedure Laterality Date   BREAST EXCISIONAL BIOPSY Right    No scar   CESAREAN SECTION     x3   SHOULDER ARTHROSCOPY W/ ROTATOR CUFF REPAIR Right 2004   SHOULDER ARTHROSCOPY W/ ROTATOR CUFF REPAIR Left 2008    There were no vitals filed for this visit.   Subjective Assessment - 06/15/20 1519     Subjective Patient continues to have ups and downs, she reports that she feels better after PT and is  moving better overall, she does report a lot of different stressors that contribute to mm tension and pain    Currently in Pain? Yes    Pain Score 6     Pain Location Neck    Pain Orientation Right    Aggravating Factors  stress, doing things around the house    Pain Relieving Factors "treatment feels good and really is helping me"                               [name] Adult PT Treatment/Exercise - 06/15/20 0001       Lumbar Exercises: Supine   Other Supine Lumbar Exercises feet on ball K2C, trunk rotation, small bridges and isometric abs      Modalities   Modalities Cryotherapy      Moist Heat Therapy   Number Minutes Moist Heat 12 Minutes    Moist Heat Location Lumbar Spine;Cervical      Cryotherapy   Number Minutes Cryotherapy 12 Minutes    Cryotherapy Location --   occiput   Type of Cryotherapy Ice pack      Electrical Stimulation   Electrical Stimulation Location right upper trap cervical area    Electrical Stimulation Action IFC  Electrical Stimulation Parameters supine    Electrical Stimulation Goals Pain      Manual Therapy   Manual Therapy Soft tissue mobilization;Manual Traction    Manual therapy comments some PROM with manual upper trap and levator stretches    Soft tissue mobilization right cervical, upper trap, rhomboid and into the low back area in sitting    Manual Traction occipital release                      PT Short Term Goals - 05/29/20 0923       PT SHORT TERM GOAL #1   Title independent with an initial HEP    Status Achieved               PT Long Term Goals - 06/10/20 1620       PT LONG TERM GOAL #1   Title Patient will report pain decrease to 6 or better out of 10 with functional activities to improve overall function    Status On-going      PT LONG TERM GOAL #2   Title increase right LE strength to 4/5    Status On-going                   Plan - 06/15/20 1523     Clinical  Impression Statement Patient continues to report different stressors from cleaning at home, to a phone and internet issue.  She reports that she is able to do more with less pain due to the treatment we have been doing, she continues to have significant right upper trap and cervical spasms and pain.  She did report diffiuclty hanging clothes up with the right shoulder, her right shoulder ROM was WFL's but a little tight    PT Next Visit Plan continue to add exercises as she tolerates    Consulted and Agree with Plan of Care Patient             Patient will benefit from skilled therapeutic intervention in order to improve the following deficits and impairments:  Decreased activity tolerance, Decreased endurance, Decreased range of motion, Decreased strength, Pain, Impaired flexibility, Impaired tone, Postural dysfunction, Impaired sensation, Decreased balance, Improper body mechanics, Increased muscle spasms, Abnormal gait  Visit Diagnosis: Chronic bilateral low back pain without sciatica  Pain in right hip  Acute pain of right knee  Difficulty in walking, not elsewhere classified  Muscle spasm of back  Cervicalgia  Cervicogenic headache  Radiculopathy, cervical region     Problem List Patient Active Problem List   Diagnosis Date Noted   Common migraine with intractable migraine 12/10/2019   Chronic pain 08/13/2017   Left groin pain 08/05/2017   Occipital neuralgia of right side 10/21/2016   Chronic migraine 01/26/2015   Cervicogenic headache 12/16/2014   Subclinical hypothyroidism 12/16/2014   Memory loss 10/08/2014   Depression 10/08/2014   Generalized anxiety disorder 10/08/2014   Tremor 10/08/2014   Major depressive disorder, single episode, moderate (HCC) 08/27/2014   Memory difficulties 08/27/2014   Degenerative disc disease, cervical 08/27/2014   Chronically on opiate therapy 08/27/2014    Jearld Lesch., PT 06/15/2020, 3:27 PM  [facility] [place] [facility] [address] [place], [place], [address] Phone: [phone]   Fax:  [phone]  Name: Tiya Schrupp MRN: [id number] Date of Birth: [birth year]

## 2020-06-18 ENCOUNTER — Other Ambulatory Visit: Payer: Self-pay

## 2020-06-18 ENCOUNTER — Ambulatory Visit: Payer: Medicare Other

## 2020-06-18 DIAGNOSIS — M25561 Pain in right knee: Secondary | ICD-10-CM

## 2020-06-18 DIAGNOSIS — M5412 Radiculopathy, cervical region: Secondary | ICD-10-CM

## 2020-06-18 DIAGNOSIS — R262 Difficulty in walking, not elsewhere classified: Secondary | ICD-10-CM

## 2020-06-18 DIAGNOSIS — M25551 Pain in right hip: Secondary | ICD-10-CM

## 2020-06-18 DIAGNOSIS — G4486 Cervicogenic headache: Secondary | ICD-10-CM

## 2020-06-18 DIAGNOSIS — G8929 Other chronic pain: Secondary | ICD-10-CM

## 2020-06-18 DIAGNOSIS — M545 Low back pain, unspecified: Secondary | ICD-10-CM | POA: Diagnosis not present

## 2020-06-18 DIAGNOSIS — M542 Cervicalgia: Secondary | ICD-10-CM

## 2020-06-18 DIAGNOSIS — M62830 Muscle spasm of back: Secondary | ICD-10-CM

## 2020-06-18 NOTE — Therapy (Signed)
[facility] [place] [facility] [address] [place], [place], [address] Phone: [phone]   Fax:  [phone]  Physical Therapy Treatment  Patient Details  Name: Toni [name] MRN: [id number] Date of Birth: October 07, 1955 No data recorded  Encounter Date: 06/18/2020   PT End of Session - 06/18/20 1558     Visit Number 9    Date for PT Re-Evaluation 07/27/20    Authorization Type UHC medicare 10 visit medicare guidelines    PT Start Time 1600    PT Stop Time 1645    PT Time Calculation (min) 45 min    Activity Tolerance Patient tolerated treatment well    Behavior During Therapy [facility] for tasks assessed/performed             Past Medical History:  Diagnosis Date   Brachial neuritis or radiculitis NOS    Chronic hepatitis C (HCC) 10/05/2015   Depressive disorder, not elsewhere classified    Essential and other specified forms of tremor    Fracture of 5th metatarsal 08/13/2017   Fracture of multiple pubic rami (HCC) 08/13/2017   Headache(784.0)    Hepatitis C infection 12/05/2017   Treated and now resolved and "cured " as of early 2018  And      Memory loss    Microscopic hematuria    seed dr Patsi Sears in past   Migraine, unspecified, without mention of intractable migraine without mention of status migrainosus    Multiple rib fractures 08/13/2017   Other specified disorders of rotator cuff syndrome of shoulder and allied disorders    Vulvar pain    rx with valium supp    Past Surgical History:  Procedure Laterality Date   BREAST EXCISIONAL BIOPSY Right    No scar   CESAREAN SECTION     x3   SHOULDER ARTHROSCOPY W/ ROTATOR CUFF REPAIR Right 2004   SHOULDER ARTHROSCOPY W/ ROTATOR CUFF REPAIR Left 2008    There were no vitals filed for this visit.   Subjective Assessment - 06/18/20 1642     Subjective Patient continues to have ups and downs, she reports that she feels better after PT and is moving better overall, she  does report a lot of different stressors that contribute to mm tension and pain. She reports alot of pain/difficulty with using right arm d/t pain                               [facility] Adult PT Treatment/Exercise - 06/18/20 0001       Neck Exercises: Seated   Other Seated Exercise shrugs x 10    Other Seated Exercise scapular retractions x10      Lumbar Exercises: Supine   Other Supine Lumbar Exercises --      Moist Heat Therapy   Moist Heat Location Lumbar Spine;Cervical      Cryotherapy   Cryotherapy Location --   occiput     Electrical Stimulation   Electrical Stimulation Location right upper trap cervical area    Electrical Stimulation Goals Pain      Manual Therapy   Manual Therapy Soft tissue mobilization;Manual Traction    Manual therapy comments some PROM with manual upper trap and levator stretches, gentle pec minor STM    Soft tissue mobilization right cervical, upper trap, rhomboid and into the low back area in sitting    Manual Traction occipital release  PT Short Term Goals - 05/29/20 3235       PT SHORT TERM GOAL #1   Title independent with an initial HEP    Status Achieved               PT Long Term Goals - 06/10/20 1620       PT LONG TERM GOAL #1   Title Patient will report pain decrease to 6 or better out of 10 with functional activities to improve overall function    Status On-going      PT LONG TERM GOAL #2   Title increase right LE strength to 4/5    Status On-going                   Plan - 06/18/20 1559     Clinical Impression Statement Patient continues to report different stressors from cleaning at home, to a phone and internet issue. Overall reports that she is able to do more with less pain due to the treatment we have been doing, she continues to have significant right upper trap and cervical spasms and pain. She did report some occipital neuralgia type pain as well, so  focus of today more MT.  She was a bit more focused today on her difficulty with hanging clothes up with the right shoulder and doing cleaning tasks with the right arm > left. Spent some time end of session focused on relaxation, modalities.    PT Next Visit Plan continue to add exercises as she tolerates    Consulted and Agree with Plan of Care Patient             Patient will benefit from skilled therapeutic intervention in order to improve the following deficits and impairments:  Decreased activity tolerance, Decreased endurance, Decreased range of motion, Decreased strength, Pain, Impaired flexibility, Impaired tone, Postural dysfunction, Impaired sensation, Decreased balance, Improper body mechanics, Increased muscle spasms, Abnormal gait  Visit Diagnosis: Chronic bilateral low back pain without sciatica  Pain in right hip  Acute pain of right knee  Muscle spasm of back  Difficulty in walking, not elsewhere classified  Cervicalgia  Cervicogenic headache  Radiculopathy, cervical region     Problem List Patient Active Problem List   Diagnosis Date Noted   Common migraine with intractable migraine 12/10/2019   Chronic pain 08/13/2017   Left groin pain 08/05/2017   Occipital neuralgia of right side 10/21/2016   Chronic migraine 01/26/2015   Cervicogenic headache 12/16/2014   Subclinical hypothyroidism 12/16/2014   Memory loss 10/08/2014   Depression 10/08/2014   Generalized anxiety disorder 10/08/2014   Tremor 10/08/2014   Major depressive disorder, single episode, moderate (HCC) 08/27/2014   Memory difficulties 08/27/2014   Degenerative disc disease, cervical 08/27/2014   Chronically on opiate therapy 08/27/2014    Anson Crofts, PT, DPT 06/18/2020, 5:02 PM  [facility] [place] [facility] [address] [place], [place], [address] Phone: [phone]   Fax:  [phone]  Name: Toni [name] MRN:  [id number] Date of Birth: 1955/03/30

## 2020-06-23 ENCOUNTER — Ambulatory Visit: Payer: Medicare Other

## 2020-06-24 ENCOUNTER — Ambulatory Visit: Payer: Medicare Other

## 2020-06-24 ENCOUNTER — Other Ambulatory Visit: Payer: Self-pay

## 2020-06-24 DIAGNOSIS — G4486 Cervicogenic headache: Secondary | ICD-10-CM

## 2020-06-24 DIAGNOSIS — R262 Difficulty in walking, not elsewhere classified: Secondary | ICD-10-CM

## 2020-06-24 DIAGNOSIS — M25561 Pain in right knee: Secondary | ICD-10-CM

## 2020-06-24 DIAGNOSIS — M62830 Muscle spasm of back: Secondary | ICD-10-CM

## 2020-06-24 DIAGNOSIS — M545 Low back pain, unspecified: Secondary | ICD-10-CM | POA: Diagnosis not present

## 2020-06-24 DIAGNOSIS — M542 Cervicalgia: Secondary | ICD-10-CM

## 2020-06-24 DIAGNOSIS — M5412 Radiculopathy, cervical region: Secondary | ICD-10-CM

## 2020-06-24 DIAGNOSIS — G8929 Other chronic pain: Secondary | ICD-10-CM

## 2020-06-24 DIAGNOSIS — M25551 Pain in right hip: Secondary | ICD-10-CM

## 2020-06-24 NOTE — Therapy (Signed)
[facility] [place] [facility] [address] [place], [place], [address] Phone: [phone]   Fax:  [phone]  Physical Therapy Treatment Progress Note Reporting Period 04/27/2020 to 06/24/2020  See note below for Objective Data and Assessment of Progress/Goals.      Patient Details  Name: Toni [name] MRN: [id number] Date of Birth: 08-18-1955 No data recorded  Encounter Date: 06/24/2020   PT End of Session - 06/24/20 1052     Visit Number 10    Date for PT Re-Evaluation 07/27/20    Authorization Type UHC medicare 10 visit medicare guidelines    PT Start Time 1004    PT Stop Time 1058    PT Time Calculation (min) 54 min    Activity Tolerance Patient tolerated treatment well    Behavior During Therapy [facility] for tasks assessed/performed             Past Medical History:  Diagnosis Date   Brachial neuritis or radiculitis NOS    Chronic hepatitis C (HCC) 10/05/2015   Depressive disorder, not elsewhere classified    Essential and other specified forms of tremor    Fracture of 5th metatarsal 08/13/2017   Fracture of multiple pubic rami (HCC) 08/13/2017   Headache(784.0)    Hepatitis C infection 12/05/2017   Treated and now resolved and "cured " as of early 2018  And      Memory loss    Microscopic hematuria    seed dr Patsi Sears in past   Migraine, unspecified, without mention of intractable migraine without mention of status migrainosus    Multiple rib fractures 08/13/2017   Other specified disorders of rotator cuff syndrome of shoulder and allied disorders    Vulvar pain    rx with valium supp    Past Surgical History:  Procedure Laterality Date   BREAST EXCISIONAL BIOPSY Right    No scar   CESAREAN SECTION     x3   SHOULDER ARTHROSCOPY W/ ROTATOR CUFF REPAIR Right 2004   SHOULDER ARTHROSCOPY W/ ROTATOR CUFF REPAIR Left 2008    There were no vitals filed for this visit.   Subjective Assessment - 06/24/20 1005      Subjective Had felt pretty good, noticed could turn head far, then went swimming which felt very good but then with pain again. Missed yesterdays appointment bc of appointment time mixup and was very upset.    Currently in Pain? Yes    Pain Score 7     Pain Location Head   neuroalgia pain                              [facility] Adult PT Treatment/Exercise - 06/24/20 0001       High Level Balance   High Level Balance Comments standing small marches, hip ext taps, hip abd taps x 10 alt B with BUE support.      Neck Exercises: Seated   Neck Retraction 10 reps   shoulder retractions yellow TB   W Back 10 reps   yellow TB     Lumbar Exercises: Supine   Other Supine Lumbar Exercises feet on ball K2C, trunk rotation, small bridges and isometric abs      Moist Heat Therapy   Number Minutes Moist Heat 10 Minutes   3 minutes start of session before exercises,   Moist Heat Location Cervical      Cryotherapy   Number Minutes Cryotherapy 10  Minutes    Cryotherapy Location --   occiput   Type of Cryotherapy Ice pack      Electrical Stimulation   Electrical Stimulation Location right upper trap cervical area    Electrical Stimulation Action premod    Electrical Stimulation Goals Pain      Manual Therapy   Manual Therapy Soft tissue mobilization;Manual Traction    Manual therapy comments some PROM with manual upper trap and levator stretches, gentle pec minor STM    Soft tissue mobilization right cervical, upper trap, rhomboid and into the low back area in sitting    Manual Traction occipital release                      PT Short Term Goals - 05/29/20 9833       PT SHORT TERM GOAL #1   Title independent with an initial HEP    Status Achieved               PT Long Term Goals - 06/24/20 1007       PT LONG TERM GOAL #1   Title Patient will report pain decrease to 6 or better out of 10 with functional activities to improve overall function     Status On-going      PT LONG TERM GOAL #2   Title increase right LE strength to 4/5    Status On-going      PT LONG TERM GOAL #3   Title increase lumbar ROM 25%    Status On-going      PT LONG TERM GOAL #4   Title walk with a minimal deviation    Status On-going                   Plan - 06/24/20 1033     Clinical Impression Statement Pt tolerated session well today and we were able to incorporate some more postural strength and initiate some standing balance exercises. She reports she feels PT has been helping, she has been able to do more some days recently. Does continue to report carious stressors in her life. Making gradual progress towards goals and will benefit from continued PT to work on impriving funcitonal mobility, strength and balance.    PT Next Visit Plan continue to add exercises as she tolerates    PT Home Exercise Plan Educated on relaxation techniques, deep breathing to help decrease neck tension    Consulted and Agree with Plan of Care Patient             Patient will benefit from skilled therapeutic intervention in order to improve the following deficits and impairments:  Decreased activity tolerance, Decreased endurance, Decreased range of motion, Decreased strength, Pain, Impaired flexibility, Impaired tone, Postural dysfunction, Impaired sensation, Decreased balance, Improper body mechanics, Increased muscle spasms, Abnormal gait  Visit Diagnosis: Chronic bilateral low back pain without sciatica  Pain in right hip  Acute pain of right knee  Muscle spasm of back  Difficulty in walking, not elsewhere classified  Cervicalgia  Cervicogenic headache  Radiculopathy, cervical region     Problem List Patient Active Problem List   Diagnosis Date Noted   Common migraine with intractable migraine 12/10/2019   Chronic pain 08/13/2017   Left groin pain 08/05/2017   Occipital neuralgia of right side 10/21/2016   Chronic migraine 01/26/2015    Cervicogenic headache 12/16/2014   Subclinical hypothyroidism 12/16/2014   Memory loss 10/08/2014   Depression 10/08/2014   Generalized  anxiety disorder 10/08/2014   Tremor 10/08/2014   Major depressive disorder, single episode, moderate (HCC) 08/27/2014   Memory difficulties 08/27/2014   Degenerative disc disease, cervical 08/27/2014   Chronically on opiate therapy 08/27/2014    Anson Crofts, PT, DPT 06/24/2020, 11:01 AM  [facility] [address] [place], [place], [address] Phone: [phone]   Fax:  [phone]  Name: Toni [name] MRN: [id number] Date of Birth: [birth year]

## 2020-06-26 ENCOUNTER — Ambulatory Visit: Payer: Medicare Other

## 2020-06-29 ENCOUNTER — Encounter: Payer: Self-pay | Admitting: Physical Therapy

## 2020-06-29 ENCOUNTER — Other Ambulatory Visit: Payer: Self-pay

## 2020-06-29 ENCOUNTER — Ambulatory Visit: Payer: Medicare Other | Admitting: Physical Therapy

## 2020-06-29 DIAGNOSIS — G4486 Cervicogenic headache: Secondary | ICD-10-CM

## 2020-06-29 DIAGNOSIS — M25551 Pain in right hip: Secondary | ICD-10-CM

## 2020-06-29 DIAGNOSIS — M542 Cervicalgia: Secondary | ICD-10-CM

## 2020-06-29 DIAGNOSIS — M62830 Muscle spasm of back: Secondary | ICD-10-CM

## 2020-06-29 DIAGNOSIS — M545 Low back pain, unspecified: Secondary | ICD-10-CM | POA: Diagnosis not present

## 2020-06-29 DIAGNOSIS — M5412 Radiculopathy, cervical region: Secondary | ICD-10-CM

## 2020-06-29 DIAGNOSIS — G8929 Other chronic pain: Secondary | ICD-10-CM

## 2020-06-29 DIAGNOSIS — M25561 Pain in right knee: Secondary | ICD-10-CM

## 2020-06-29 DIAGNOSIS — R262 Difficulty in walking, not elsewhere classified: Secondary | ICD-10-CM

## 2020-06-29 NOTE — Therapy (Signed)
[facility] [address] [place], [place], [address] Phone: [phone]   Fax:  [phone]  Physical Therapy Treatment  Patient Details  Name: Toni [name] MRN: [id number] Date of Birth: [birth year] No data recorded  Encounter Date: 06/29/2020   PT End of Session - 06/29/20 0909     Visit Number 11    Date for PT Re-Evaluation 07/27/20    Authorization Type UHC medicare 10 visit medicare guidelines    PT Start Time 0841    PT Stop Time 0933    PT Time Calculation (min) 52 min    Activity Tolerance Patient tolerated treatment well    Behavior During Therapy [facility] for tasks assessed/performed;Anxious             Past Medical History:  Diagnosis Date   Brachial neuritis or radiculitis NOS    Chronic hepatitis C (HCC) 10/05/2015   Depressive disorder, not elsewhere classified    Essential and other specified forms of tremor    Fracture of 5th metatarsal 08/13/2017   Fracture of multiple pubic rami (HCC) 08/13/2017   Headache(784.0)    Hepatitis C infection 12/05/2017   Treated and now resolved and "cured " as of early 2018  And      Memory loss    Microscopic hematuria    seed dr Patsi Sears in past   Migraine, unspecified, without mention of intractable migraine without mention of status migrainosus    Multiple rib fractures 08/13/2017   Other specified disorders of rotator cuff syndrome of shoulder and allied disorders    Vulvar pain    rx with valium supp    Past Surgical History:  Procedure Laterality Date   BREAST EXCISIONAL BIOPSY Right    No scar   CESAREAN SECTION     x3   SHOULDER ARTHROSCOPY W/ ROTATOR CUFF REPAIR Right 2004   SHOULDER ARTHROSCOPY W/ ROTATOR CUFF REPAIR Left 2008    There were no vitals filed for this visit.   Subjective Assessment - 06/29/20 0848     Subjective on one hand I am getting better, but then on the other I regress and have difficulty doing the ADL's I need to.   She at times is tearful when giving subjective    Currently in Pain? Yes    Pain Score 7     Pain Location Neck    Pain Orientation Right    Aggravating Factors  ADL's and stress                               [facility] Adult PT Treatment/Exercise - 06/29/20 0001       Neck Exercises: Theraband   Shoulder Extension 20 reps;Red    Rows 20 reps;Red    Shoulder External Rotation 20 reps;Red      Neck Exercises: Standing   Neck Retraction 15 reps;3 secs      Neck Exercises: Seated   Other Seated Exercise shrugs x 10    Other Seated Exercise scapular retractions x10      Modalities   Modalities Traction      Moist Heat Therapy   Number Minutes Moist Heat 10 Minutes    Moist Heat Location Cervical      Cryotherapy   Number Minutes Cryotherapy 10 Minutes    Cryotherapy Location --   occiput   Type of Cryotherapy Ice pack      Electrical Stimulation  Electrical Stimulation Location right upper trap cervical area    Electrical Stimulation Action IFC    Electrical Stimulation Parameters supine    Electrical Stimulation Goals Pain      Traction   Type of Traction Cervical    Max (lbs) 8    Rest Time static    Time 10      Manual Therapy   Manual Therapy Soft tissue mobilization;Manual Traction    Manual therapy comments some PROM with manual upper trap and levator stretches, gentle pec minor STM    Soft tissue mobilization right cervical, upper trap, rhomboid and into the low back area in sitting    Manual Traction --                      PT Short Term Goals - 05/29/20 1194       PT SHORT TERM GOAL #1   Title independent with an initial HEP    Status Achieved               PT Long Term Goals - 06/24/20 1007       PT LONG TERM GOAL #1   Title Patient will report pain decrease to 6 or better out of 10 with functional activities to improve overall function    Status On-going      PT LONG TERM GOAL #2   Title increase right  LE strength to 4/5    Status On-going      PT LONG TERM GOAL #3   Title increase lumbar ROM 25%    Status On-going      PT LONG TERM GOAL #4   Title walk with a minimal deviation    Status On-going                   Plan - 06/29/20 0910     Clinical Impression Statement Patient became emotionsl talking about hte MRI, which shows stenosis on the left, she is worried about all the pain she has on the right.  I talked with her about the option of traction to open spaces and the need for strength and posture to help prevent in the future, she was in agreement, I tried traction today light, she did not seem to tolerate well, HA and neck pain, but was open to see if it helped.    PT Next Visit Plan see how the traction went    Consulted and Agree with Plan of Care Patient             Patient will benefit from skilled therapeutic intervention in order to improve the following deficits and impairments:  Decreased activity tolerance, Decreased endurance, Decreased range of motion, Decreased strength, Pain, Impaired flexibility, Impaired tone, Postural dysfunction, Impaired sensation, Decreased balance, Improper body mechanics, Increased muscle spasms, Abnormal gait  Visit Diagnosis: Chronic bilateral low back pain without sciatica  Pain in right hip  Acute pain of right knee  Muscle spasm of back  Difficulty in walking, not elsewhere classified  Cervicalgia  Cervicogenic headache  Radiculopathy, cervical region     Problem List Patient Active Problem List   Diagnosis Date Noted   Common migraine with intractable migraine 12/10/2019   Chronic pain 08/13/2017   Left groin pain 08/05/2017   Occipital neuralgia of right side 10/21/2016   Chronic migraine 01/26/2015   Cervicogenic headache 12/16/2014   Subclinical hypothyroidism 12/16/2014   Memory loss 10/08/2014   Depression 10/08/2014   Generalized anxiety  disorder 10/08/2014   Tremor 10/08/2014   Major  depressive disorder, single episode, moderate (HCC) 08/27/2014   Memory difficulties 08/27/2014   Degenerative disc disease, cervical 08/27/2014   Chronically on opiate therapy 08/27/2014    Jearld Lesch., PT 06/29/2020, 9:26 AM  [facility] [address]. [place], [place], [address] Phone: [phone]   Fax:  [phone]  Name: Toni [name] MRN: [id number] Date of Birth: 06/03/1955

## 2020-07-03 ENCOUNTER — Ambulatory Visit: Payer: Medicare Other | Attending: Neurosurgery | Admitting: Physical Therapy

## 2020-07-03 DIAGNOSIS — G8929 Other chronic pain: Secondary | ICD-10-CM | POA: Insufficient documentation

## 2020-07-03 DIAGNOSIS — M25551 Pain in right hip: Secondary | ICD-10-CM | POA: Insufficient documentation

## 2020-07-03 DIAGNOSIS — M25561 Pain in right knee: Secondary | ICD-10-CM | POA: Insufficient documentation

## 2020-07-03 DIAGNOSIS — G4486 Cervicogenic headache: Secondary | ICD-10-CM | POA: Insufficient documentation

## 2020-07-03 DIAGNOSIS — M5412 Radiculopathy, cervical region: Secondary | ICD-10-CM | POA: Insufficient documentation

## 2020-07-03 DIAGNOSIS — R262 Difficulty in walking, not elsewhere classified: Secondary | ICD-10-CM | POA: Insufficient documentation

## 2020-07-03 DIAGNOSIS — M542 Cervicalgia: Secondary | ICD-10-CM | POA: Insufficient documentation

## 2020-07-03 DIAGNOSIS — M62830 Muscle spasm of back: Secondary | ICD-10-CM | POA: Insufficient documentation

## 2020-07-03 DIAGNOSIS — M545 Low back pain, unspecified: Secondary | ICD-10-CM | POA: Insufficient documentation

## 2020-07-07 ENCOUNTER — Ambulatory Visit: Payer: Medicare Other | Admitting: Physical Therapy

## 2020-07-07 ENCOUNTER — Other Ambulatory Visit: Payer: Self-pay

## 2020-07-07 ENCOUNTER — Encounter: Payer: Self-pay | Admitting: Physical Therapy

## 2020-07-07 DIAGNOSIS — M62830 Muscle spasm of back: Secondary | ICD-10-CM | POA: Diagnosis present

## 2020-07-07 DIAGNOSIS — M542 Cervicalgia: Secondary | ICD-10-CM | POA: Diagnosis present

## 2020-07-07 DIAGNOSIS — M545 Low back pain, unspecified: Secondary | ICD-10-CM | POA: Diagnosis not present

## 2020-07-07 DIAGNOSIS — G8929 Other chronic pain: Secondary | ICD-10-CM

## 2020-07-07 DIAGNOSIS — R262 Difficulty in walking, not elsewhere classified: Secondary | ICD-10-CM | POA: Diagnosis present

## 2020-07-07 DIAGNOSIS — G4486 Cervicogenic headache: Secondary | ICD-10-CM

## 2020-07-07 DIAGNOSIS — M25551 Pain in right hip: Secondary | ICD-10-CM | POA: Diagnosis present

## 2020-07-07 DIAGNOSIS — M25561 Pain in right knee: Secondary | ICD-10-CM | POA: Diagnosis present

## 2020-07-07 DIAGNOSIS — M5412 Radiculopathy, cervical region: Secondary | ICD-10-CM

## 2020-07-07 NOTE — Therapy (Signed)
[facility] [address] [place], [place], [address] Phone: [phone]   Fax:  [phone]  Physical Therapy Treatment  Patient Details  Name: Toni [name] MRN: [id number] Date of Birth: [birth year] No data recorded  Encounter Date: 07/07/2020   PT End of Session - 07/07/20 1002     Visit Number 12    Date for PT Re-Evaluation 07/27/20    Authorization Type UHC medicare 10 visit medicare guidelines    PT Start Time 0927    PT Stop Time 1015    PT Time Calculation (min) 48 min    Activity Tolerance Patient tolerated treatment well    Behavior During Therapy [facility] for tasks assessed/performed;Anxious             Past Medical History:  Diagnosis Date   Brachial neuritis or radiculitis NOS    Chronic hepatitis C (HCC) 10/05/2015   Depressive disorder, not elsewhere classified    Essential and other specified forms of tremor    Fracture of 5th metatarsal 08/13/2017   Fracture of multiple pubic rami (HCC) 08/13/2017   Headache(784.0)    Hepatitis C infection 12/05/2017   Treated and now resolved and "cured " as of early 2018  And      Memory loss    Microscopic hematuria    seed dr Patsi Sears in past   Migraine, unspecified, without mention of intractable migraine without mention of status migrainosus    Multiple rib fractures 08/13/2017   Other specified disorders of rotator cuff syndrome of shoulder and allied disorders    Vulvar pain    rx with valium supp    Past Surgical History:  Procedure Laterality Date   BREAST EXCISIONAL BIOPSY Right    No scar   CESAREAN SECTION     x3   SHOULDER ARTHROSCOPY W/ ROTATOR CUFF REPAIR Right 2004   SHOULDER ARTHROSCOPY W/ ROTATOR CUFF REPAIR Left 2008    There were no vitals filed for this visit.   Subjective Assessment - 07/07/20 0928     Subjective Patient comes in with tears, she reports that she feels like she broke her thumb, reports hit it yesterday.  I  think the neck is getting a little better    Currently in Pain? Yes    Pain Score 6     Pain Location Neck    Pain Orientation Right    Pain Descriptors / Indicators Aching;Sore                               [facility] Adult PT Treatment/Exercise - 07/07/20 0001       Moist Heat Therapy   Number Minutes Moist Heat 10 Minutes    Moist Heat Location Cervical      Cryotherapy   Number Minutes Cryotherapy 10 Minutes    Cryotherapy Location --   occiput   Type of Cryotherapy Ice pack      Electrical Stimulation   Electrical Stimulation Location right upper trap cervical area    Electrical Stimulation Action IFC    Electrical Stimulation Parameters supine    Electrical Stimulation Goals Pain      Manual Therapy   Manual Therapy Soft tissue mobilization;Manual Traction    Manual therapy comments some PROM with manual upper trap and levator stretches, gentle pec minor STM    Soft tissue mobilization right cervical, upper trap, rhomboid and into the low back area in  sitting    Manual Traction occipital release                      PT Short Term Goals - 05/29/20 7169       PT SHORT TERM GOAL #1   Title independent with an initial HEP    Status Achieved               PT Long Term Goals - 07/07/20 1007       PT LONG TERM GOAL #1   Title Patient will report pain decrease to 6 or better out of 10 with functional activities to improve overall function    Status On-going      PT LONG TERM GOAL #2   Title increase right LE strength to 4/5    Status On-going      PT LONG TERM GOAL #3   Title increase lumbar ROM 25%    Status On-going                   Plan - 07/07/20 1006     Clinical Impression Statement Patient had some increased pain and some symptoms down the right UE after the last treatment, could have been the traction.  She has a significant issue with her left thumb today and could not grip or hold anything    PT Next  Visit Plan add exercises as she tolerates    Consulted and Agree with Plan of Care Patient             Patient will benefit from skilled therapeutic intervention in order to improve the following deficits and impairments:  Decreased activity tolerance, Decreased endurance, Decreased range of motion, Decreased strength, Pain, Impaired flexibility, Impaired tone, Postural dysfunction, Impaired sensation, Decreased balance, Improper body mechanics, Increased muscle spasms, Abnormal gait  Visit Diagnosis: Chronic bilateral low back pain without sciatica  Pain in right hip  Acute pain of right knee  Muscle spasm of back  Difficulty in walking, not elsewhere classified  Cervicalgia  Cervicogenic headache  Radiculopathy, cervical region     Problem List Patient Active Problem List   Diagnosis Date Noted   Common migraine with intractable migraine 12/10/2019   Chronic pain 08/13/2017   Left groin pain 08/05/2017   Occipital neuralgia of right side 10/21/2016   Chronic migraine 01/26/2015   Cervicogenic headache 12/16/2014   Subclinical hypothyroidism 12/16/2014   Memory loss 10/08/2014   Depression 10/08/2014   Generalized anxiety disorder 10/08/2014   Tremor 10/08/2014   Major depressive disorder, single episode, moderate (HCC) 08/27/2014   Memory difficulties 08/27/2014   Degenerative disc disease, cervical 08/27/2014   Chronically on opiate therapy 08/27/2014    Jearld Lesch., PT 07/07/2020, 10:15 AM  [facility] [address] [place], [place], [address] Phone: [phone]   Fax:  [phone]  Name: Toni [name] MRN: [id number] Date of Birth: 1955/07/28

## 2020-07-09 ENCOUNTER — Ambulatory Visit (INDEPENDENT_AMBULATORY_CARE_PROVIDER_SITE_OTHER): Payer: Medicare Other

## 2020-07-09 ENCOUNTER — Ambulatory Visit: Payer: Medicare Other

## 2020-07-09 DIAGNOSIS — Z0000 Encounter for general adult medical examination without abnormal findings: Secondary | ICD-10-CM | POA: Diagnosis not present

## 2020-07-09 NOTE — Progress Notes (Signed)
Subjective:   Toni [name] is a 65 y.o. female who presents for an Initial Medicare Annual Wellness Visit.  Pt declined video visit due stating it was being recorded.Agreed telephone visit.  Virtual Visit via Video Note  I connected with Toni [name] by a video enabled telemedicine application and verified that I am speaking with the correct person using two identifiers.  Location: Patient: Home Provider: Office Persons participating in the virtual visit: patient, provider   I discussed the limitations of evaluation and management by telemedicine and the availability of in person appointments. The patient expressed understanding and agreed to proceed.     Toni [name] Toni Julian,LPN  Review of Systems    N/a       Objective:    There were no vitals filed for this visit. There is no height or weight on file to calculate BMI.  Advanced Directives 08/10/2017 08/08/2017 08/05/2017 08/04/2017 06/05/2014  Does Patient Have a Medical Advance Directive? No - Yes Yes No  Type of Advance Directive - Living will Out of facility DNR (pink MOST or yellow form) Healthcare Power of Attorney -  Would patient like information on creating a medical advance directive? No - Patient declined - - - -    Current Medications (verified) Outpatient Encounter Medications as of 07/09/2020  Medication Sig   albuterol (PROVENTIL HFA;VENTOLIN HFA) 108 (90 Base) MCG/ACT inhaler Inhale 2 puffs into the lungs every 6 (six) hours as needed for wheezing or shortness of breath.   ALPRAZolam (XANAX) 1 MG tablet Take 1 tablet (1 mg total) by mouth 4 (four) times daily as needed for anxiety (shakes).   Alum Hydroxide-Mag Trisilicate 80-14.2 MG CHEW Chew by mouth. CHEW 2 TABLETS BY MOUTH BEFORE EACH MEAL AT BEDTIME AS NEEDED FOR INDIGESTION OR HEARTBURN   amLODipine (NORVASC) 2.5 MG tablet TAKE ONE TABLET BY MOUTH DAILY   cetirizine (ZYRTEC) 10 MG tablet Take 10 mg by mouth daily as needed for allergies.    desvenlafaxine (PRISTIQ) 50 MG 24 hr tablet Take 50 mg by mouth daily.   dextroamphetamine (DEXEDRINE SPANSULE) 15 MG 24 hr capsule Take 30-45 mg by mouth daily. 2 po q am and 1 po q noon   FLUoxetine (PROZAC) 20 MG capsule Take 1 capsule by mouth daily.   gabapentin (NEURONTIN) 600 MG tablet Take 600 mg by mouth. PO TID FOR HEAD PAIN   HYDROcodone-ibuprofen (VICOPROFEN) 7.5-200 MG tablet every 6 (six) hours as needed.    ketoconazole (NIZORAL) 2 % cream APPLY TO AFFECTED AREA TWICE A DAY   levothyroxine (SYNTHROID) 25 MCG tablet TAKE 1 TABLET (25 MCG TOTAL) BY MOUTH DAILY BEFORE BREAKFAST. TAKE ON AN EMPTY STOMACH   Misc Natural Products (ESTROVEN + ENERGY MAX STRENGTH) TABS Take by mouth daily.   Multiple Vitamins-Minerals (CENTRUM SILVER ADULT 50+) TABS Take 1 tablet by mouth daily.   SUMAtriptan (IMITREX) 25 MG tablet Take 1 tablet (25 mg total) by mouth as needed for migraine. May repeat in 2 hours if headache persists or recurs.   SUMAtriptan 6 MG/0.5ML SOAJ Use 1 syringe for migraines upon awakening.  Do not use with Imitrex tablets.   No facility-administered encounter medications on file as of 07/09/2020.    Allergies (verified) Codeine   History: Past Medical History:  Diagnosis Date   Brachial neuritis or radiculitis NOS    Chronic hepatitis C (HCC) 10/05/2015   Depressive disorder, not elsewhere classified    Essential and other specified forms of tremor    Fracture of  5th metatarsal 08/13/2017   Fracture of multiple pubic rami (HCC) 08/13/2017   Headache(784.0)    Hepatitis C infection 12/05/2017   Treated and now resolved and "cured " as of early 2018  And      Memory loss    Microscopic hematuria    [name] in past   Migraine, unspecified, without mention of intractable migraine without mention of status migrainosus    Multiple rib fractures 08/13/2017   Other specified disorders of rotator cuff syndrome of shoulder and allied disorders    Vulvar pain    rx  with valium supp   Past Surgical History:  Procedure Laterality Date   BREAST EXCISIONAL BIOPSY Right    No scar   CESAREAN SECTION     x3   SHOULDER ARTHROSCOPY W/ ROTATOR CUFF REPAIR Right 2004   SHOULDER ARTHROSCOPY W/ ROTATOR CUFF REPAIR Left 2008   Family History  Problem Relation Age of Onset   Headache Mother    Stroke Father        father passed away june17   Diabetes Maternal Grandmother    Other Son        Mother is deceased 2013 father moved to the area 2014.   Social History   Socioeconomic History   Marital status: Divorced    Spouse name: Not on file   Number of children: 3   Years of education: Not on file   Highest education level: Not on file  Occupational History   Not on file  Tobacco Use   Smoking status: Former    Pack years: 0.00    Types: Cigarettes    Quit date: 05/23/1978    Years since quitting: 42.1   Smokeless tobacco: Never  Vaping Use   Vaping Use: Never used  Substance and Sexual Activity   Alcohol use: No    Alcohol/week: 0.0 standard drinks   Drug use: No   Sexual activity: Not on file  Other Topics Concern   Not on file  Social History Narrative   Lives with grandson   Left Handed   Drinks 1-2 cups daily   Social Determinants of Health   Financial Resource Strain: Not on file  Food Insecurity: Not on file  Transportation Needs: Not on file  Physical Activity: Not on file  Stress: Not on file  Social Connections: Not on file    Tobacco Counseling Counseling given: Not Answered   Clinical Intake:                 Diabetic?no         Activities of Daily Living No flowsheet data found.  Patient Care Team: Panosh, Neta MendsWanda K, MD as PCP - General (Internal Medicine) Shirlean KellyNudelman, Robert, MD as Consulting Physician (Neurosurgery) Milagros EvenerKaur, Rupinder, MD as Consulting Physician (Psychiatry) Candace Gallusovington Ferebee Desai, Sarah, PA-C as Physician Assistant (Neurosurgery) York SpanielWillis, Charles K, MD as Consulting Physician  (Neurology)  Indicate any recent Medical Services you may have received from other than [facility] providers in the past year (date may be approximate).     Assessment:   This is a routine wellness examination for Toni [name].  Hearing/Vision screen No results found.  Dietary issues and exercise activities discussed:     Goals Addressed   None    Depression Screen PHQ 2/9 Scores 03/05/2018 02/28/2017  PHQ - 2 Score 0 0    Fall Risk Fall Risk  11/18/2019 04/18/2019 01/14/2019 01/12/2018 02/21/2017  Falls in the past year? 0 0 0 1  No  Number falls in past yr: 0 - 0 1 -  Injury with Fall? 0 - 0 1 -  Follow up - - Falls evaluation completed - -    FALL RISK PREVENTION PERTAINING TO THE HOME:  Any stairs in or around the home? Yes  If so, are there any without handrails? No  Home free of loose throw rugs in walkways, pet beds, electrical cords, etc? Yes  Adequate lighting in your home to reduce risk of falls? Yes   ASSISTIVE DEVICES UTILIZED TO PREVENT FALLS:  Life alert? Yes  Use of a cane, walker or w/c? No  Grab bars in the bathroom? No  Shower chair or bench in shower? No  Elevated toilet seat or a handicapped toilet? No    Cognitive Function: Normal cognitive status assessed by direct observation by this Nurse Health Advisor. No abnormalities found.   MMSE - Mini Mental State Exam 10/08/2014  Orientation to time 4  Orientation to Place 5  Registration 3  Attention/ Calculation 5  Recall 3  Language- name 2 objects 2  Language- repeat 1  Language- follow 3 step command 3  Language- read & follow direction 1  Write a sentence 1  Copy design 1  Total score 29        Immunizations Immunization History  Administered Date(s) Administered   Fluad Quad(high Dose 65+) 09/26/2019   Hep A / Hep B 12/15/2014, 01/16/2015, 06/22/2015   Influenza Inj Mdck Quad Pf 09/28/2018   Influenza,inj,Quad PF,6+ Mos 11/08/2012, 10/14/2014, 09/21/2015, 10/06/2016, 09/07/2017   PFIZER(Purple  Top)SARS-COV-2 Vaccination 01/28/2019, 02/18/2019, 09/26/2019   Pneumococcal Conjugate-13 05/15/2019   Td 07/19/2017    TDAP status: Up to date  Flu Vaccine status: Up to date  Pneumococcal vaccine status: Up to date  Covid-19 vaccine status: Completed vaccines  Qualifies for Shingles Vaccine? Yes   Zostavax completed No   Shingrix Completed?: No.    Education has been provided regarding the importance of this vaccine. Patient has been advised to call insurance company to determine out of pocket expense if they have not yet received this vaccine. Advised may also receive vaccine at local pharmacy or Health Dept. Verbalized acceptance and understanding.  Screening Tests Health Maintenance  Topic Date Due   Zoster Vaccines- Shingrix (1 of 2) Never done   COVID-19 Vaccine (4 - Booster for Pfizer series) 12/26/2019   PNA vac Low Risk Adult (2 of 2 - PPSV23) 05/14/2020   INFLUENZA VACCINE  08/03/2020   MAMMOGRAM  07/29/2021   COLONOSCOPY (Pts 45-66yrs Insurance coverage will need to be confirmed)  03/29/2026   TETANUS/TDAP  07/20/2027   DEXA SCAN  Completed   Hepatitis C Screening  Completed   HPV VACCINES  Aged Out    Health Maintenance  Health Maintenance Due  Topic Date Due   Zoster Vaccines- Shingrix (1 of 2) Never done   COVID-19 Vaccine (4 - Booster for Pfizer series) 12/26/2019   PNA vac Low Risk Adult (2 of 2 - PPSV23) 05/14/2020    Colorectal cancer screening: Type of screening: Colonoscopy. Completed 03/28/2016. Repeat every 10 years  Mammogram status: Completed scheduled 07/29/2020. Repeat every year  Bone Density status: Completed 07/30/2019. Results reflect: Bone density results: OSTEOPENIA. Repeat every 5 years.  Lung Cancer Screening: (Low Dose CT Chest recommended if Age 40-80 years, 30 pack-year currently smoking OR have quit w/in 15years.) does not qualify.   Lung Cancer Screening Referral: n/a  Additional Screening:  Hepatitis C Screening: does not  qualify; Completed 05/31/2019  Vision Screening: Recommended annual ophthalmology exams for early detection of glaucoma and other disorders of the eye. Is the patient up to date with their annual eye exam?  Yes  Who is the provider or what is the name of the office in which the patient attends annual eye exams? [facility]  If pt is not established with a provider, would they like to be referred to a provider to establish care? No .   Dental Screening: Recommended annual dental exams for proper oral hygiene  Community Resource Referral / Chronic Care Management: CRR required this visit?  No   CCM required this visit?  No      Plan:     I have personally reviewed and noted the following in the patient's chart:   Medical and social history Use of alcohol, tobacco or illicit drugs  Current medications and supplements including opioid prescriptions. Patient is currently taking opioid prescriptions. Information provided to patient regarding non-opioid alternatives. Patient advised to discuss non-opioid treatment plan with their provider. Functional ability and status Nutritional status Physical activity Advanced directives List of other physicians Hospitalizations, surgeries, and ER visits in previous 12 months Vitals Screenings to include cognitive, depression, and falls Referrals and appointments  In addition, I have reviewed and discussed with patient certain preventive protocols, quality metrics, and best practice recommendations. A written personalized care plan for preventive services as well as general preventive health recommendations were provided to patient.     March Rummage, LPN   03/07/7423   Nurse Notes: none

## 2020-07-09 NOTE — Patient Instructions (Signed)
Ms. Toni [name] , Thank you for taking time to come for your Medicare Wellness Visit. I appreciate your ongoing commitment to your health goals. Please review the following plan we discussed and let me know if I can assist you in the future.   Screening recommendations/referrals: Colonoscopy: 03/28/2016  due 2028 Mammogram: 07/30/2019  due 2022 Bone Density: 07/30/2019 Recommended yearly ophthalmology/optometry visit for glaucoma screening and checkup Recommended yearly dental visit for hygiene and checkup  Vaccinations: Influenza vaccine: due fall 2022 Pneumococcal vaccine: due in May 2022 Tdap vaccine: 07/19/2017 due in 2029 Shingles vaccine: will obtain local pharmacy   Advanced directives: will provide copies   Conditions/risks identified: none   Next appointment: none    Preventive Care 67 Years and Older, Female Preventive care refers to lifestyle choices and visits with your health care provider that can promote health and wellness. What does preventive care include? A yearly physical exam. This is also called an annual well check. Dental exams once or twice a year. Routine eye exams. Ask your health care provider how often you should have your eyes checked. Personal lifestyle choices, including: Daily care of your teeth and gums. Regular physical activity. Eating a healthy diet. Avoiding tobacco and drug use. Limiting alcohol use. Practicing safe sex. Taking low-dose aspirin every day. Taking vitamin and mineral supplements as recommended by your health care provider. What happens during an annual well check? The services and screenings done by your health care provider during your annual well check will depend on your age, overall health, lifestyle risk factors, and family history of disease. Counseling  Your health care provider may ask you questions about your: Alcohol use. Tobacco use. Drug use. Emotional well-being. Home and relationship  well-being. Sexual activity. Eating habits. History of falls. Memory and ability to understand (cognition). Work and work Astronomer. Reproductive health. Screening  You may have the following tests or measurements: Height, weight, and BMI. Blood pressure. Lipid and cholesterol levels. These may be checked every 5 years, or more frequently if you are over 14 years old. Skin check. Lung cancer screening. You may have this screening every year starting at age 23 if you have a 30-pack-year history of smoking and currently smoke or have quit within the past 15 years. Fecal occult blood test (FOBT) of the stool. You may have this test every year starting at age 66. Flexible sigmoidoscopy or colonoscopy. You may have a sigmoidoscopy every 5 years or a colonoscopy every 10 years starting at age 67. Hepatitis C blood test. Hepatitis B blood test. Sexually transmitted disease (STD) testing. Diabetes screening. This is done by checking your blood sugar (glucose) after you have not eaten for a while (fasting). You may have this done every 1-3 years. Bone density scan. This is done to screen for osteoporosis. You may have this done starting at age 67. Mammogram. This may be done every 1-2 years. Talk to your health care provider about how often you should have regular mammograms. Talk with your health care provider about your test results, treatment options, and if necessary, the need for more tests. Vaccines  Your health care provider may recommend certain vaccines, such as: Influenza vaccine. This is recommended every year. Tetanus, diphtheria, and acellular pertussis (Tdap, Td) vaccine. You may need a Td booster every 10 years. Zoster vaccine. You may need this after age 17. Pneumococcal 13-valent conjugate (PCV13) vaccine. One dose is recommended after age 12. Pneumococcal polysaccharide (PPSV23) vaccine. One dose is recommended after age 67. Talk to  your health care provider about which  screenings and vaccines you need and how often you need them. This information is not intended to replace advice given to you by your health care provider. Make sure you discuss any questions you have with your health care provider. Document Released: 01/16/2015 Document Revised: 09/09/2015 Document Reviewed: 10/21/2014 Elsevier Interactive Patient Education  2017 [place] Prevention in the Home Falls can cause injuries. They can happen to people of all ages. There are many things you can do to make your home safe and to help prevent falls. What can I do on the outside of my home? Regularly fix the edges of walkways and driveways and fix any cracks. Remove anything that might make you trip as you walk through a door, such as a raised step or threshold. Trim any bushes or trees on the path to your home. Use bright outdoor lighting. Clear any walking paths of anything that might make someone trip, such as rocks or tools. Regularly check to see if handrails are loose or broken. Make sure that both sides of any steps have handrails. Any raised decks and porches should have guardrails on the edges. Have any leaves, snow, or ice cleared regularly. Use sand or salt on walking paths during winter. Clean up any spills in your garage right away. This includes oil or grease spills. What can I do in the bathroom? Use night lights. Install grab bars by the toilet and in the tub and shower. Do not use towel bars as grab bars. Use non-skid mats or decals in the tub or shower. If you need to sit down in the shower, use a plastic, non-slip stool. Keep the floor dry. Clean up any water that spills on the floor as soon as it happens. Remove soap buildup in the tub or shower regularly. Attach bath mats securely with double-sided non-slip rug tape. Do not have throw rugs and other things on the floor that can make you trip. What can I do in the bedroom? Use night lights. Make sure that you have a  light by your bed that is easy to reach. Do not use any sheets or blankets that are too big for your bed. They should not hang down onto the floor. Have a firm chair that has side arms. You can use this for support while you get dressed. Do not have throw rugs and other things on the floor that can make you trip. What can I do in the kitchen? Clean up any spills right away. Avoid walking on wet floors. Keep items that you use a lot in easy-to-reach places. If you need to reach something above you, use a strong step stool that has a grab bar. Keep electrical cords out of the way. Do not use floor polish or wax that makes floors slippery. If you must use wax, use non-skid floor wax. Do not have throw rugs and other things on the floor that can make you trip. What can I do with my stairs? Do not leave any items on the stairs. Make sure that there are handrails on both sides of the stairs and use them. Fix handrails that are broken or loose. Make sure that handrails are as long as the stairways. Check any carpeting to make sure that it is firmly attached to the stairs. Fix any carpet that is loose or worn. Avoid having throw rugs at the top or bottom of the stairs. If you do have throw rugs, attach  them to the floor with carpet tape. Make sure that you have a light switch at the top of the stairs and the bottom of the stairs. If you do not have them, ask someone to add them for you. What else can I do to help prevent falls? Wear shoes that: Do not have high heels. Have rubber bottoms. Are comfortable and fit you well. Are closed at the toe. Do not wear sandals. If you use a stepladder: Make sure that it is fully opened. Do not climb a closed stepladder. Make sure that both sides of the stepladder are locked into place. Ask someone to hold it for you, if possible. Clearly mark and make sure that you can see: Any grab bars or handrails. First and last steps. Where the edge of each step  is. Use tools that help you move around (mobility aids) if they are needed. These include: Canes. Walkers. Scooters. Crutches. Turn on the lights when you go into a dark area. Replace any light bulbs as soon as they burn out. Set up your furniture so you have a clear path. Avoid moving your furniture around. If any of your floors are uneven, fix them. If there are any pets around you, be aware of where they are. Review your medicines with your doctor. Some medicines can make you feel dizzy. This can increase your chance of falling. Ask your doctor what other things that you can do to help prevent falls. This information is not intended to replace advice given to you by your health care provider. Make sure you discuss any questions you have with your health care provider. Document Released: 10/16/2008 Document Revised: 05/28/2015 Document Reviewed: 01/24/2014 Elsevier Interactive Patient Education  2017 [employer].

## 2020-07-13 ENCOUNTER — Encounter: Payer: Self-pay | Admitting: Physical Therapy

## 2020-07-13 ENCOUNTER — Ambulatory Visit: Payer: Medicare Other | Admitting: Physical Therapy

## 2020-07-13 ENCOUNTER — Other Ambulatory Visit: Payer: Self-pay

## 2020-07-13 DIAGNOSIS — M545 Low back pain, unspecified: Secondary | ICD-10-CM | POA: Diagnosis not present

## 2020-07-13 DIAGNOSIS — M62830 Muscle spasm of back: Secondary | ICD-10-CM

## 2020-07-13 DIAGNOSIS — G8929 Other chronic pain: Secondary | ICD-10-CM

## 2020-07-13 DIAGNOSIS — M5412 Radiculopathy, cervical region: Secondary | ICD-10-CM

## 2020-07-13 DIAGNOSIS — G4486 Cervicogenic headache: Secondary | ICD-10-CM

## 2020-07-13 DIAGNOSIS — M25551 Pain in right hip: Secondary | ICD-10-CM

## 2020-07-13 DIAGNOSIS — M25561 Pain in right knee: Secondary | ICD-10-CM

## 2020-07-13 DIAGNOSIS — R262 Difficulty in walking, not elsewhere classified: Secondary | ICD-10-CM

## 2020-07-13 DIAGNOSIS — M542 Cervicalgia: Secondary | ICD-10-CM

## 2020-07-13 NOTE — Therapy (Signed)
[facility] [address] [place], [place], [address] Phone: [phone]   Fax:  [phone]  Physical Therapy Treatment  Patient Details  Name: Toni [name] MRN: [id number] Date of Birth: 1955/06/22 No data recorded  Encounter Date: 07/13/2020   PT End of Session - 07/13/20 1143     Visit Number 13    Date for PT Re-Evaluation 07/27/20    Authorization Type UHC medicare 10 visit medicare guidelines    PT Start Time 0845    PT Stop Time 0933    PT Time Calculation (min) 48 min    Activity Tolerance Patient tolerated treatment well    Behavior During Therapy [facility] for tasks assessed/performed;Anxious             Past Medical History:  Diagnosis Date   Brachial neuritis or radiculitis NOS    Chronic hepatitis C (HCC) 10/05/2015   Depressive disorder, not elsewhere classified    Essential and other specified forms of tremor    Fracture of 5th metatarsal 08/13/2017   Fracture of multiple pubic rami (HCC) 08/13/2017   Headache(784.0)    Hepatitis C infection 12/05/2017   Treated and now resolved and "cured " as of early 2018  And      Memory loss    Microscopic hematuria    seed dr Patsi Sears in past   Migraine, unspecified, without mention of intractable migraine without mention of status migrainosus    Multiple rib fractures 08/13/2017   Other specified disorders of rotator cuff syndrome of shoulder and allied disorders    Vulvar pain    rx with valium supp    Past Surgical History:  Procedure Laterality Date   BREAST EXCISIONAL BIOPSY Right    No scar   CESAREAN SECTION     x3   SHOULDER ARTHROSCOPY W/ ROTATOR CUFF REPAIR Right 2004   SHOULDER ARTHROSCOPY W/ ROTATOR CUFF REPAIR Left 2008    There were no vitals filed for this visit.   Subjective Assessment - 07/13/20 1141     Subjective Patient with reports that she feels like the estim irritates the nerve at times, she does report having company  come so she has been very busy and has some increased stress    Currently in Pain? Yes    Pain Score 7     Pain Location Neck    Aggravating Factors  stress                               [facility] Adult PT Treatment/Exercise - 07/13/20 0001       Neck Exercises: Machines for Strengthening   Nustep level 4 x 6 minutes      Neck Exercises: Theraband   Shoulder Extension 20 reps;Red    Rows 20 reps;Red    Shoulder External Rotation 20 reps;Red      Neck Exercises: Standing   Neck Retraction 15 reps;3 secs    Other Standing Exercises overhead ball lift, W backs      Neck Exercises: Supine   Other Supine Exercise feet on ball K2C, trunk rotation, small bridges and isometric abs      Manual Therapy   Manual Therapy Soft tissue mobilization;Manual Traction    Manual therapy comments some PROM with manual upper trap and levator stretches, gentle pec minor STM    Soft tissue mobilization right cervical, upper trap, rhomboid and into the low back area  in sitting    Manual Traction occipital release                      PT Short Term Goals - 05/29/20 8101       PT SHORT TERM GOAL #1   Title independent with an initial HEP    Status Achieved               PT Long Term Goals - 07/07/20 1007       PT LONG TERM GOAL #1   Title Patient will report pain decrease to 6 or better out of 10 with functional activities to improve overall function    Status On-going      PT LONG TERM GOAL #2   Title increase right LE strength to 4/5    Status On-going      PT LONG TERM GOAL #3   Title increase lumbar ROM 25%    Status On-going                   Plan - 07/13/20 1144     Clinical Impression Statement I added some exercises, needed cues for form and posture, some cues to activate the corea nd other mms.Still very tight iwth knots in the right upper trap and the right neck area    PT Next Visit Plan see how the exercises did with her pain  levels    Consulted and Agree with Plan of Care Patient             Patient will benefit from skilled therapeutic intervention in order to improve the following deficits and impairments:  Decreased activity tolerance, Decreased endurance, Decreased range of motion, Decreased strength, Pain, Impaired flexibility, Impaired tone, Postural dysfunction, Impaired sensation, Decreased balance, Improper body mechanics, Increased muscle spasms, Abnormal gait  Visit Diagnosis: Chronic bilateral low back pain without sciatica  Pain in right hip  Acute pain of right knee  Muscle spasm of back  Difficulty in walking, not elsewhere classified  Cervicalgia  Cervicogenic headache  Radiculopathy, cervical region     Problem List Patient Active Problem List   Diagnosis Date Noted   Common migraine with intractable migraine 12/10/2019   Chronic pain 08/13/2017   Left groin pain 08/05/2017   Occipital neuralgia of right side 10/21/2016   Chronic migraine 01/26/2015   Cervicogenic headache 12/16/2014   Subclinical hypothyroidism 12/16/2014   Memory loss 10/08/2014   Depression 10/08/2014   Generalized anxiety disorder 10/08/2014   Tremor 10/08/2014   Major depressive disorder, single episode, moderate (HCC) 08/27/2014   Memory difficulties 08/27/2014   Degenerative disc disease, cervical 08/27/2014   Chronically on opiate therapy 08/27/2014    Jearld Lesch., PT 07/13/2020, 11:46 AM  [facility] [place] [facility] [address] [place], [place], [address] Phone: [phone]   Fax:  [phone]  Name: Toni [name] MRN: [id number] Date of Birth: 1955-11-22

## 2020-07-17 ENCOUNTER — Ambulatory Visit: Payer: Medicare Other

## 2020-07-17 ENCOUNTER — Other Ambulatory Visit: Payer: Self-pay

## 2020-07-17 DIAGNOSIS — M545 Low back pain, unspecified: Secondary | ICD-10-CM | POA: Diagnosis not present

## 2020-07-17 DIAGNOSIS — G4486 Cervicogenic headache: Secondary | ICD-10-CM

## 2020-07-17 DIAGNOSIS — M25551 Pain in right hip: Secondary | ICD-10-CM

## 2020-07-17 DIAGNOSIS — M542 Cervicalgia: Secondary | ICD-10-CM

## 2020-07-17 DIAGNOSIS — R262 Difficulty in walking, not elsewhere classified: Secondary | ICD-10-CM

## 2020-07-17 DIAGNOSIS — M25561 Pain in right knee: Secondary | ICD-10-CM

## 2020-07-17 DIAGNOSIS — M62830 Muscle spasm of back: Secondary | ICD-10-CM

## 2020-07-17 DIAGNOSIS — M5412 Radiculopathy, cervical region: Secondary | ICD-10-CM

## 2020-07-17 DIAGNOSIS — G8929 Other chronic pain: Secondary | ICD-10-CM

## 2020-07-17 NOTE — Therapy (Signed)
[facility] [address] [place], [place], [address] Phone: [phone]   Fax:  [phone]  Physical Therapy Treatment  Patient Details  Name: Toni [name] MRN: [id number] Date of Birth: [birth year] No data recorded  Encounter Date: 07/17/2020   PT End of Session - 07/17/20 0924     Visit Number 14    Date for PT Re-Evaluation 07/27/20    Authorization Type UHC medicare 10 visit medicare guidelines    PT Start Time [id number]    PT Stop Time 0930    PT Time Calculation (min) 41 min    Activity Tolerance Patient tolerated treatment well    Behavior During Therapy [facility] for tasks assessed/performed;Anxious             Past Medical History:  Diagnosis Date   Brachial neuritis or radiculitis NOS    Chronic hepatitis C (HCC) 10/05/2015   Depressive disorder, not elsewhere classified    Essential and other specified forms of tremor    Fracture of 5th metatarsal 08/13/2017   Fracture of multiple pubic rami (HCC) 08/13/2017   Headache(784.0)    Hepatitis C infection 12/05/2017   Treated and now resolved and "cured " as of early 2018  And      Memory loss    Microscopic hematuria    seed dr Patsi Sears in past   Migraine, unspecified, without mention of intractable migraine without mention of status migrainosus    Multiple rib fractures 08/13/2017   Other specified disorders of rotator cuff syndrome of shoulder and allied disorders    Vulvar pain    rx with valium supp    Past Surgical History:  Procedure Laterality Date   BREAST EXCISIONAL BIOPSY Right    No scar   CESAREAN SECTION     x3   SHOULDER ARTHROSCOPY W/ ROTATOR CUFF REPAIR Right 2004   SHOULDER ARTHROSCOPY W/ ROTATOR CUFF REPAIR Left 2008    There were no vitals filed for this visit.   Subjective Assessment - 07/17/20 0922     Subjective Will be having company this weekend so some increased stress about this. Would not like to do estim on the  neck. Reports overall feeling like she is moving more    Currently in Pain? Yes    Pain Score 7     Pain Location Neck                               [facility] Adult PT Treatment/Exercise - 07/17/20 0001       Neck Exercises: Machines for Strengthening   Nustep --      Neck Exercises: Theraband   Shoulder Extension 20 reps;Red    Rows 20 reps;Red    Shoulder External Rotation 20 reps;Red      Neck Exercises: Standing   Neck Retraction 15 reps;3 secs    Other Standing Exercises --      Neck Exercises: Supine   Other Supine Exercise feet on ball K2C, trunk rotation, small bridges and isometric abs      Moist Heat Therapy   Number Minutes Moist Heat 10 Minutes    Moist Heat Location Lumbar Spine   and UT on right     Cryotherapy   Number Minutes Cryotherapy 10 Minutes    Cryotherapy Location --   occiput   Type of Cryotherapy Ice pack      Electrical Stimulation   Electrical  Stimulation Location lumbar    Electrical Stimulation Action IFC    Electrical Stimulation Parameters seated    Electrical Stimulation Goals Pain      Manual Therapy   Manual Therapy Soft tissue mobilization;Manual Traction    Manual therapy comments --    Soft tissue mobilization thoracolumbar paraspinals, QL B in sitting. Some tender areas felt, decreased in palpable tension with sustained pressure.    Manual Traction --                      PT Short Term Goals - 05/29/20 3710       PT SHORT TERM GOAL #1   Title independent with an initial HEP    Status Achieved               PT Long Term Goals - 07/07/20 1007       PT LONG TERM GOAL #1   Title Patient will report pain decrease to 6 or better out of 10 with functional activities to improve overall function    Status On-going      PT LONG TERM GOAL #2   Title increase right LE strength to 4/5    Status On-going      PT LONG TERM GOAL #3   Title increase lumbar ROM 25%    Status On-going                    Plan - 07/17/20 0925     Clinical Impression Statement Continued with exercises from previous session with cues for posture provided. Demonstrated some improved neck ROM but did have tenderness in the R UT, responded well to MHP end of session. She states she does not want to do estim for the neck but low back was more bothersome today so we focused modalities end of session otherwise here. Tolerated well.    PT Next Visit Plan see how the exercises did with her pain levels    Consulted and Agree with Plan of Care Patient             Patient will benefit from skilled therapeutic intervention in order to improve the following deficits and impairments:  Decreased activity tolerance, Decreased endurance, Decreased range of motion, Decreased strength, Pain, Impaired flexibility, Impaired tone, Postural dysfunction, Impaired sensation, Decreased balance, Improper body mechanics, Increased muscle spasms, Abnormal gait  Visit Diagnosis: Chronic bilateral low back pain without sciatica  Pain in right hip  Acute pain of right knee  Difficulty in walking, not elsewhere classified  Muscle spasm of back  Cervicalgia  Cervicogenic headache  Radiculopathy, cervical region     Problem List Patient Active Problem List   Diagnosis Date Noted   Common migraine with intractable migraine 12/10/2019   Chronic pain 08/13/2017   Left groin pain 08/05/2017   Occipital neuralgia of right side 10/21/2016   Chronic migraine 01/26/2015   Cervicogenic headache 12/16/2014   Subclinical hypothyroidism 12/16/2014   Memory loss 10/08/2014   Depression 10/08/2014   Generalized anxiety disorder 10/08/2014   Tremor 10/08/2014   Major depressive disorder, single episode, moderate (HCC) 08/27/2014   Memory difficulties 08/27/2014   Degenerative disc disease, cervical 08/27/2014   Chronically on opiate therapy 08/27/2014    Anson Crofts, PT, DPT 07/17/2020, 9:28 AM  [facility] [place] [address] [place], [place], [address] Phone: [phone]   Fax:  [phone]  Name: Elia Nunley MRN: [id number] Date of  Birth: September 01, 1955

## 2020-07-20 ENCOUNTER — Ambulatory Visit: Payer: Medicare Other | Admitting: Physical Therapy

## 2020-07-23 ENCOUNTER — Ambulatory Visit: Payer: Medicare Other

## 2020-07-23 ENCOUNTER — Other Ambulatory Visit: Payer: Self-pay

## 2020-07-23 VITALS — BP 152/92

## 2020-07-23 DIAGNOSIS — M545 Low back pain, unspecified: Secondary | ICD-10-CM | POA: Diagnosis not present

## 2020-07-23 DIAGNOSIS — M25551 Pain in right hip: Secondary | ICD-10-CM

## 2020-07-23 DIAGNOSIS — G8929 Other chronic pain: Secondary | ICD-10-CM

## 2020-07-23 DIAGNOSIS — M5412 Radiculopathy, cervical region: Secondary | ICD-10-CM

## 2020-07-23 DIAGNOSIS — M542 Cervicalgia: Secondary | ICD-10-CM

## 2020-07-23 DIAGNOSIS — M25561 Pain in right knee: Secondary | ICD-10-CM

## 2020-07-23 DIAGNOSIS — M62830 Muscle spasm of back: Secondary | ICD-10-CM

## 2020-07-23 DIAGNOSIS — R262 Difficulty in walking, not elsewhere classified: Secondary | ICD-10-CM

## 2020-07-23 NOTE — Therapy (Signed)
[facility] [address] [place], [place], [address] Phone: [phone]   Fax:  [phone]  Physical Therapy Treatment  Patient Details  Name: Toni [name] MRN: [id number] Date of Birth: January 16, 1955 No data recorded  Encounter Date: 07/23/2020   PT End of Session - 07/23/20 1043     Visit Number 15    Date for PT Re-Evaluation 07/27/20    Authorization Type [employer] medicare 10 visit medicare guidelines    PT Start Time 1004    PT Stop Time 1050    PT Time Calculation (min) 46 min    Activity Tolerance Patient tolerated treatment well    Behavior During Therapy [facility] for tasks assessed/performed;Anxious             Past Medical History:  Diagnosis Date   Brachial neuritis or radiculitis NOS    Chronic hepatitis C (HCC) 10/05/2015   Depressive disorder, not elsewhere classified    Essential and other specified forms of tremor    Fracture of 5th metatarsal 08/13/2017   Fracture of multiple pubic rami (HCC) 08/13/2017   Headache(784.0)    Hepatitis C infection 12/05/2017   Treated and now resolved and "cured " as of early 2018  And      Memory loss    Microscopic hematuria    seed dr Patsi Sears in past   Migraine, unspecified, without mention of intractable migraine without mention of status migrainosus    Multiple rib fractures 08/13/2017   Other specified disorders of rotator cuff syndrome of shoulder and allied disorders    Vulvar pain    rx with valium supp    Past Surgical History:  Procedure Laterality Date   BREAST EXCISIONAL BIOPSY Right    No scar   CESAREAN SECTION     x3   SHOULDER ARTHROSCOPY W/ ROTATOR CUFF REPAIR Right 2004   SHOULDER ARTHROSCOPY W/ ROTATOR CUFF REPAIR Left 2008    Vitals:   07/23/20 1014 07/23/20 1041  BP: (!) 160/100 (!) 152/92     Subjective Assessment - 07/23/20 1006     Subjective Had a very rough weekend and in alot of pain (occipital nerve and left thumb, feels  more where she has had previous injury in the foot too) . asked to take vitals. reports feeling PT has helped better rom with looking to the right with neck    Currently in Pain? Yes    Pain Score 8                                [facility] Adult PT Treatment/Exercise - 07/23/20 0001       Therapeutic Activites    Therapeutic Activities --   relaxation techniques, deep breathing     Moist Heat Therapy   Number Minutes Moist Heat 10 Minutes    Moist Heat Location Lumbar Spine   and B UT     Cryotherapy   Number Minutes Cryotherapy 10 Minutes    Cryotherapy Location --   occiput   Type of Cryotherapy Ice pack      Electrical Stimulation   Electrical Stimulation Location lumbar    Electrical Stimulation Action IFC    Electrical Stimulation Parameters supine    Electrical Stimulation Goals Pain      Manual Therapy   Manual Therapy Soft tissue mobilization;Manual Traction    Manual therapy comments some PROM with manual upper trap and levator  stretches, gentle pec minor STM    Soft tissue mobilization thoracolumbar paraspinals, QL B in sitting. Some tender areas felt, decreased in palpable tension with sustained pressure.    Manual Traction gentle occipital release, seated                      PT Short Term Goals - 05/29/20 8469       PT SHORT TERM GOAL #1   Title independent with an initial HEP    Status Achieved               PT Long Term Goals - 07/07/20 1007       PT LONG TERM GOAL #1   Title Patient will report pain decrease to 6 or better out of 10 with functional activities to improve overall function    Status On-going      PT LONG TERM GOAL #2   Title increase right LE strength to 4/5    Status On-going      PT LONG TERM GOAL #3   Title increase lumbar ROM 25%    Status On-going                   Plan - 07/23/20 1044     Clinical Impression Statement Pain muc increased after increased stressors this weekend so  todays focus was primarily on relaxation techniques and MT with modalities end of session. She did ask to have vitals taken today and BP was very high at start of session, she did mention she had not taken BP medications or Xanax today which may contribute to it in addition to her higher pain levels. We discussed importance of taking medications as prescribed and monitoring BP. After treatment BP had decreased from baseline, some improvement in pain and neck mobility.    PT Next Visit Plan assess and progress, add exercises if tolerated next visit    Consulted and Agree with Plan of Care Patient             Patient will benefit from skilled therapeutic intervention in order to improve the following deficits and impairments:  Decreased activity tolerance, Decreased endurance, Decreased range of motion, Decreased strength, Pain, Impaired flexibility, Impaired tone, Postural dysfunction, Impaired sensation, Decreased balance, Improper body mechanics, Increased muscle spasms, Abnormal gait  Visit Diagnosis: Chronic bilateral low back pain without sciatica  Pain in right hip  Acute pain of right knee  Difficulty in walking, not elsewhere classified  Muscle spasm of back  Cervicalgia  Radiculopathy, cervical region     Problem List Patient Active Problem List   Diagnosis Date Noted   Common migraine with intractable migraine 12/10/2019   Chronic pain 08/13/2017   Left groin pain 08/05/2017   Occipital neuralgia of right side 10/21/2016   Chronic migraine 01/26/2015   Cervicogenic headache 12/16/2014   Subclinical hypothyroidism 12/16/2014   Memory loss 10/08/2014   Depression 10/08/2014   Generalized anxiety disorder 10/08/2014   Tremor 10/08/2014   Major depressive disorder, single episode, moderate (HCC) 08/27/2014   Memory difficulties 08/27/2014   Degenerative disc disease, cervical 08/27/2014   Chronically on opiate therapy 08/27/2014    Anson Crofts, PT,  DPT 07/23/2020, 12:21 PM  [facility] [address] [place], [place], [address] Phone: [phone]   Fax:  [phone]  Name: Toni [name] MRN: [id number] Date of Birth: [birth year]

## 2020-07-28 ENCOUNTER — Ambulatory Visit: Payer: Medicare Other

## 2020-07-28 ENCOUNTER — Other Ambulatory Visit: Payer: Self-pay

## 2020-07-28 DIAGNOSIS — M545 Low back pain, unspecified: Secondary | ICD-10-CM

## 2020-07-28 DIAGNOSIS — M542 Cervicalgia: Secondary | ICD-10-CM

## 2020-07-28 DIAGNOSIS — G4486 Cervicogenic headache: Secondary | ICD-10-CM

## 2020-07-28 DIAGNOSIS — M5412 Radiculopathy, cervical region: Secondary | ICD-10-CM

## 2020-07-28 DIAGNOSIS — R262 Difficulty in walking, not elsewhere classified: Secondary | ICD-10-CM

## 2020-07-28 DIAGNOSIS — M25551 Pain in right hip: Secondary | ICD-10-CM

## 2020-07-28 DIAGNOSIS — M25561 Pain in right knee: Secondary | ICD-10-CM

## 2020-07-28 DIAGNOSIS — M62830 Muscle spasm of back: Secondary | ICD-10-CM

## 2020-07-28 DIAGNOSIS — G8929 Other chronic pain: Secondary | ICD-10-CM

## 2020-07-28 NOTE — Therapy (Signed)
[facility] [address] [place], [place], [address] Phone: [phone]   Fax:  [phone]  Physical Therapy Treatment/ Recert  Patient Details  Name: Toni [name] MRN: [id number] Date of Birth: 1955/02/19 No data recorded  Encounter Date: 07/28/2020   PT End of Session - 07/28/20 1223     Visit Number 16    Date for PT Re-Evaluation 09/01/20    Authorization Type UHC medicare 10 visit medicare guidelines    PT Start Time 1220    PT Stop Time 1300    PT Time Calculation (min) 40 min    Activity Tolerance Patient tolerated treatment well    Behavior During Therapy [facility] for tasks assessed/performed;Anxious             Past Medical History:  Diagnosis Date   Brachial neuritis or radiculitis NOS    Chronic hepatitis C (HCC) 10/05/2015   Depressive disorder, not elsewhere classified    Essential and other specified forms of tremor    Fracture of 5th metatarsal 08/13/2017   Fracture of multiple pubic rami (HCC) 08/13/2017   Headache(784.0)    Hepatitis C infection 12/05/2017   Treated and now resolved and "cured " as of early 2018  And      Memory loss    Microscopic hematuria    seed dr Patsi Sears in past   Migraine, unspecified, without mention of intractable migraine without mention of status migrainosus    Multiple rib fractures 08/13/2017   Other specified disorders of rotator cuff syndrome of shoulder and allied disorders    Vulvar pain    rx with valium supp    Past Surgical History:  Procedure Laterality Date   BREAST EXCISIONAL BIOPSY Right    No scar   CESAREAN SECTION     x3   SHOULDER ARTHROSCOPY W/ ROTATOR CUFF REPAIR Right 2004   SHOULDER ARTHROSCOPY W/ ROTATOR CUFF REPAIR Left 2008    There were no vitals filed for this visit.   Subjective Assessment - 07/28/20 1221     Subjective "had a bad contusion" reported with thumb checked out. saw neurologist/apin med and got an injection in  the head for occipital neuralgia and reports an egg on back of head. has been having a migraine.    Currently in Pain? Yes                [facility] PT Assessment - 07/28/20 0001       AROM   Overall AROM Comments lumbar ROM improved to 50% with pain  lateral flexion left more bothersome than right.      Strength   Overall Strength Comments 3+/5 B with discomfort in the left hip.                           [facility] Adult PT Treatment/Exercise - 07/28/20 0001       Lumbar Exercises: Seated   Other Seated Lumbar Exercises marches in sitting x 10 B alt      Lumbar Exercises: Supine   Other Supine Lumbar Exercises feet on ball K2C x 10, small bridges x10, x 5. and isometric abs      Moist Heat Therapy   Number Minutes Moist Heat 10 Minutes    Moist Heat Location Lumbar Spine   and B UT     Electrical Stimulation   Electrical Stimulation Location lumbar    Electrical Stimulation Action IFC  Electrical Stimulation Parameters supine    Electrical Stimulation Goals Pain      Manual Therapy   Manual Therapy Soft tissue mobilization;Manual Traction    Manual therapy comments some PROM with manual upper trap and levator stretches, gentle pec minor STM    Soft tissue mobilization thoracolumbar paraspinals, QL B in sitting. Some tender areas felt, decreased in palpable tension with sustained pressure.                      PT Short Term Goals - 05/29/20 4627       PT SHORT TERM GOAL #1   Title independent with an initial HEP    Status Achieved               PT Long Term Goals - 07/28/20 1229       PT LONG TERM GOAL #1   Title Patient will report pain decrease to 6 or better out of 10 with functional activities to improve overall function    Status On-going   highest it now gets is 8-9/10     PT LONG TERM GOAL #2   Title increase right LE strength to 4/5    Status On-going   making progress     PT LONG TERM GOAL #3   Title increase lumbar  ROM 25%    Status On-going      PT LONG TERM GOAL #4   Title walk with a minimal deviation    Status On-going   Still with unsteady gait.                  Plan - 07/28/20 1225     Clinical Impression Statement Neck motion has much improved but still with some pain and flareups of occipital neuralgia for which she reports she recently had another injection.  Pt does feel like things have improved, feels stronger with PT. Low back more bothersome than neck today. BLE strength has improved since initial evaluation but is limited by some weakness and LBP, hip pain. She continues to share concerns about balance/steadiness. Gentle exercises tolerated fairly today and plan to continue to progress with resistance, strength and balance training as tolerated - she also states she would like to work towards this. She will benefit from continued PT to work on improving functional core strengt/stability, balance, and decrease back/neck pain.    PT Frequency 2x / week    PT Duration 4 weeks    PT Treatment/Interventions ADLs/Self Care Home Management;Moist Heat;Therapeutic activities;Therapeutic exercise;Neuromuscular re-education;Patient/family education;Manual techniques;Passive range of motion;Joint Manipulations;Other (comment);Traction;Dry needling;Electrical Stimulation;Cryotherapy    PT Next Visit Plan assess and progress, add exercises if tolerated next visit. balance training in standing on firm vs airex, seated?    Consulted and Agree with Plan of Care Patient             Patient will benefit from skilled therapeutic intervention in order to improve the following deficits and impairments:  Decreased activity tolerance, Decreased endurance, Decreased range of motion, Decreased strength, Pain, Impaired flexibility, Impaired tone, Postural dysfunction, Impaired sensation, Decreased balance, Improper body mechanics, Increased muscle spasms, Abnormal gait  Visit Diagnosis: Chronic  bilateral low back pain without sciatica  Pain in right hip  Acute pain of right knee  Difficulty in walking, not elsewhere classified  Muscle spasm of back  Cervicalgia  Radiculopathy, cervical region  Cervicogenic headache     Problem List Patient Active Problem List   Diagnosis Date Noted  Common migraine with intractable migraine 12/10/2019   Chronic pain 08/13/2017   Left groin pain 08/05/2017   Occipital neuralgia of right side 10/21/2016   Chronic migraine 01/26/2015   Cervicogenic headache 12/16/2014   Subclinical hypothyroidism 12/16/2014   Memory loss 10/08/2014   Depression 10/08/2014   Generalized anxiety disorder 10/08/2014   Tremor 10/08/2014   Major depressive disorder, single episode, moderate (HCC) 08/27/2014   Memory difficulties 08/27/2014   Degenerative disc disease, cervical 08/27/2014   Chronically on opiate therapy 08/27/2014    Anson Crofts, PT, DPT 07/28/2020, 3:00 PM  [facility] [address] [place], [place], [address] Phone: [phone]   Fax:  [phone]  Name: Toni [name] MRN: [id number] Date of Birth: Feb 21, 1955

## 2020-07-31 ENCOUNTER — Ambulatory Visit: Payer: Medicare Other

## 2020-07-31 ENCOUNTER — Other Ambulatory Visit: Payer: Self-pay

## 2020-07-31 DIAGNOSIS — M545 Low back pain, unspecified: Secondary | ICD-10-CM

## 2020-07-31 DIAGNOSIS — G4486 Cervicogenic headache: Secondary | ICD-10-CM

## 2020-07-31 DIAGNOSIS — M62830 Muscle spasm of back: Secondary | ICD-10-CM

## 2020-07-31 DIAGNOSIS — M542 Cervicalgia: Secondary | ICD-10-CM

## 2020-07-31 DIAGNOSIS — M25551 Pain in right hip: Secondary | ICD-10-CM

## 2020-07-31 DIAGNOSIS — M5412 Radiculopathy, cervical region: Secondary | ICD-10-CM

## 2020-07-31 DIAGNOSIS — R262 Difficulty in walking, not elsewhere classified: Secondary | ICD-10-CM

## 2020-07-31 DIAGNOSIS — G8929 Other chronic pain: Secondary | ICD-10-CM

## 2020-07-31 DIAGNOSIS — M25561 Pain in right knee: Secondary | ICD-10-CM

## 2020-07-31 NOTE — Therapy (Signed)
[facility] [address] [place], [place], [address] Phone: [phone]   Fax:  [phone]  Physical Therapy Treatment  Patient Details  Name: Toni [name] MRN: [id number] Date of Birth: 1955/04/17 Referring Provider (PT): Jaclynn Major Date: 07/31/2020   PT End of Session - 07/31/20 0922     Visit Number 17    Date for PT Re-Evaluation 09/01/20    Authorization Type UHC medicare 10 visit medicare guidelines    PT Start Time 0845    PT Stop Time 0930    PT Time Calculation (min) 45 min    Activity Tolerance Patient tolerated treatment well    Behavior During Therapy [facility] for tasks assessed/performed;Anxious             Past Medical History:  Diagnosis Date   Brachial neuritis or radiculitis NOS    Chronic hepatitis C (HCC) 10/05/2015   Depressive disorder, not elsewhere classified    Essential and other specified forms of tremor    Fracture of 5th metatarsal 08/13/2017   Fracture of multiple pubic rami (HCC) 08/13/2017   Headache(784.0)    Hepatitis C infection 12/05/2017   Treated and now resolved and "cured " as of early 2018  And      Memory loss    Microscopic hematuria    seed dr Patsi Sears in past   Migraine, unspecified, without mention of intractable migraine without mention of status migrainosus    Multiple rib fractures 08/13/2017   Other specified disorders of rotator cuff syndrome of shoulder and allied disorders    Vulvar pain    rx with valium supp    Past Surgical History:  Procedure Laterality Date   BREAST EXCISIONAL BIOPSY Right    No scar   CESAREAN SECTION     x3   SHOULDER ARTHROSCOPY W/ ROTATOR CUFF REPAIR Right 2004   SHOULDER ARTHROSCOPY W/ ROTATOR CUFF REPAIR Left 2008    There were no vitals filed for this visit.   Subjective Assessment - 07/31/20 0921     Subjective "thank you for sending the update to the doctor" " my right shoulder is bothering me"  difficulty lifting right arm because of pain, also would like to work on gait    Currently in Pain? Yes    Pain Score 8     Pain Location Back    Pain Orientation Lower;Mid                               [facility] Adult PT Treatment/Exercise - 07/31/20 0001       High Level Balance   High Level Balance Comments NBOS CS, tandem CGA firm and foam with HHA      Neck Exercises: Standing   Other Standing Exercises standing shoulder shrugg 2# x 10. backwards shoulder rolls 2# x 10 B. ER with scap retraction  x 10. hands  behind head chest stretch 3" x 10. standing ball rollouts forward for shoulder and LB stretch 3" x 10.      Lumbar Exercises: Seated   Other Seated Lumbar Exercises seated hip abd red TB 2 x 10. seated marches with red TB x 10      Lumbar Exercises: Supine   Other Supine Lumbar Exercises feet on ball K2C x 10, small bridges x10      Moist Heat Therapy   Number Minutes Moist Heat 10 Minutes  Moist Heat Location Lumbar Spine      Electrical Stimulation   Electrical Stimulation Location lumbar    Electrical Stimulation Action IFC    Electrical Stimulation Parameters supine    Electrical Stimulation Goals Pain                      PT Short Term Goals - 05/29/20 1610       PT SHORT TERM GOAL #1   Title independent with an initial HEP    Status Achieved               PT Long Term Goals - 07/28/20 1229       PT LONG TERM GOAL #1   Title Patient will report pain decrease to 6 or better out of 10 with functional activities to improve overall function    Status On-going   highest it now gets is 8-9/10     PT LONG TERM GOAL #2   Title increase right LE strength to 4/5    Status On-going   making progress     PT LONG TERM GOAL #3   Title increase lumbar ROM 25%    Status On-going      PT LONG TERM GOAL #4   Title walk with a minimal deviation    Status On-going   Still with unsteady gait.                  Plan  - 07/31/20 0923     Clinical Impression Statement Pt tolerated more exercises today without c/o increased pain in session. Emphasis today was on scapular/mid back strength, proximal hip strengthening, and we did incorporate some balance challenges. did nicely with NBOS on firm surfaces but tandem on firm or foam was diffiuclt requiring SBA to HHA for stability. Pt seemed to enjoy exercises we did today and did nicely with these with some short rest breaks provided between exercises. We did not focus discussion around pain today which i believe also may have helped. Ended with modalities for pain relief with focus on low back today.    PT Frequency 2x / week    PT Duration 4 weeks    PT Treatment/Interventions ADLs/Self Care Home Management;Moist Heat;Therapeutic activities;Therapeutic exercise;Neuromuscular re-education;Patient/family education;Manual techniques;Passive range of motion;Joint Manipulations;Other (comment);Traction;Dry needling;Electrical Stimulation;Cryotherapy    PT Next Visit Plan assess and progress, add exercises if tolerated next visit. balance training in standing on firm vs airex, seated    Consulted and Agree with Plan of Care Patient             Patient will benefit from skilled therapeutic intervention in order to improve the following deficits and impairments:  Decreased activity tolerance, Decreased endurance, Decreased range of motion, Decreased strength, Pain, Impaired flexibility, Impaired tone, Postural dysfunction, Impaired sensation, Decreased balance, Improper body mechanics, Increased muscle spasms, Abnormal gait  Visit Diagnosis: Chronic bilateral low back pain without sciatica  Pain in right hip  Acute pain of right knee  Difficulty in walking, not elsewhere classified  Muscle spasm of back  Cervicalgia  Radiculopathy, cervical region  Cervicogenic headache     Problem List Patient Active Problem List   Diagnosis Date Noted   Common  migraine with intractable migraine 12/10/2019   Chronic pain 08/13/2017   Left groin pain 08/05/2017   Occipital neuralgia of right side 10/21/2016   Chronic migraine 01/26/2015   Cervicogenic headache 12/16/2014   Subclinical hypothyroidism 12/16/2014   Memory loss 10/08/2014  Depression 10/08/2014   Generalized anxiety disorder 10/08/2014   Tremor 10/08/2014   Major depressive disorder, single episode, moderate (HCC) 08/27/2014   Memory difficulties 08/27/2014   Degenerative disc disease, cervical 08/27/2014   Chronically on opiate therapy 08/27/2014    Anson Crofts, PT, DPT 07/31/2020, 9:27 AM  [facility] [address] [place], [place], [address] Phone: [phone]   Fax:  [phone]  Name: Toni [name] MRN: [id number] Date of Birth: [birth year]

## 2020-08-04 ENCOUNTER — Ambulatory Visit: Payer: Medicare Other | Attending: Neurosurgery | Admitting: Physical Therapy

## 2020-08-04 ENCOUNTER — Encounter: Payer: Self-pay | Admitting: Physical Therapy

## 2020-08-04 ENCOUNTER — Other Ambulatory Visit: Payer: Self-pay

## 2020-08-04 DIAGNOSIS — M545 Low back pain, unspecified: Secondary | ICD-10-CM

## 2020-08-04 DIAGNOSIS — R262 Difficulty in walking, not elsewhere classified: Secondary | ICD-10-CM | POA: Diagnosis present

## 2020-08-04 DIAGNOSIS — M62830 Muscle spasm of back: Secondary | ICD-10-CM | POA: Diagnosis present

## 2020-08-04 DIAGNOSIS — M25551 Pain in right hip: Secondary | ICD-10-CM

## 2020-08-04 DIAGNOSIS — M542 Cervicalgia: Secondary | ICD-10-CM | POA: Diagnosis present

## 2020-08-04 DIAGNOSIS — M25561 Pain in right knee: Secondary | ICD-10-CM

## 2020-08-04 DIAGNOSIS — G8929 Other chronic pain: Secondary | ICD-10-CM | POA: Diagnosis present

## 2020-08-04 DIAGNOSIS — M5412 Radiculopathy, cervical region: Secondary | ICD-10-CM

## 2020-08-04 DIAGNOSIS — G4486 Cervicogenic headache: Secondary | ICD-10-CM | POA: Diagnosis present

## 2020-08-04 NOTE — Therapy (Signed)
[place]. [place], [place], [address] Phone: [phone]   Fax:  [phone]  Physical Therapy Treatment  Patient Details  Name: Toni [name] MRN: [id number] Date of Birth: Mar 05, 1955 Referring Provider (PT): Macarthur Critchley Date: 08/04/2020   PT End of Session - 08/04/20 0916     Visit Number 18    Date for PT Re-Evaluation 09/01/20    Authorization Type U[employer] medicare 10 visit medicare guidelines    PT Start Time 0845    PT Stop Time 0931    PT Time Calculation (min) 46 min    Activity Tolerance Patient tolerated treatment well    Behavior During Therapy [facility] for tasks assessed/performed             Past Medical History:  Diagnosis Date   Brachial neuritis or radiculitis NOS    Chronic hepatitis C ([place]) 10/05/2015   Depressive disorder, not elsewhere classified    Essential and other specified forms of tremor    Fracture of 5th metatarsal 08/13/2017   Fracture of multiple pubic rami ([place]) 08/13/2017   Headache(784.0)    Hepatitis C infection 12/05/2017   Treated and now resolved and "cured " as of early 2018  And      Memory loss    Microscopic hematuria    seed dr Gaynelle Arabian in past   Migraine, unspecified, without mention of intractable migraine without mention of status migrainosus    Multiple rib fractures 08/13/2017   Other specified disorders of rotator cuff syndrome of shoulder and allied disorders    Vulvar pain    rx with valium supp    Past Surgical History:  Procedure Laterality Date   BREAST EXCISIONAL BIOPSY Right    No scar   CESAREAN SECTION     x3   SHOULDER ARTHROSCOPY W/ ROTATOR CUFF REPAIR Right 2004   SHOULDER ARTHROSCOPY W/ ROTATOR CUFF REPAIR Left 2008    There were no vitals filed for this visit.   Subjective Assessment - 08/04/20 0849     Subjective Doing okay, my motions are getting better    Currently in Pain? Yes    Pain Score 7     Pain Location  Neck    Pain Relieving Factors treatment helps                               [facility] Adult PT Treatment/Exercise - 08/04/20 0001       High Level Balance   High Level Balance Comments on airex red tband scap stab.  airex reaching and eyes closed      Neck Exercises: Standing   Other Standing Exercises back to wall small weighted ball overhead lift, W backs      Lumbar Exercises: Seated   Long Arc Quad on Chair Both;2 sets;10 reps    LAQ on Chair Weights (lbs) 2.5#    Other Seated Lumbar Exercises ball tolling for shoulder and LB stretches, abdominal isometrics    Other Seated Lumbar Exercises seated hip abd red TB 2 x 10. seated marches with red TB x 10      Lumbar Exercises: Supine   Other Supine Lumbar Exercises feet on ball K2C x 10, small bridges x10, trunk rotation, isometrics abs      Knee/Hip Exercises: Seated   Ball Squeeze 20 reps    Clamshell with TheraBand Red      Traction  Type of Traction Cervical    Max (lbs) 10    Rest Time static    Time 10                      PT Short Term Goals - 05/29/20 6553       PT SHORT TERM GOAL #1   Title independent with an initial HEP    Status Achieved               PT Long Term Goals - 08/04/20 0919       PT LONG TERM GOAL #1   Title Patient will report pain decrease to 6 or better out of 10 with functional activities to improve overall function    Status Partially Met                   Plan - 08/04/20 0917     Clinical Impression Statement I personally have not seen Toni [name] in about a month, I think she is doing well, less perseveration on pain and more talk about how she feels improvement.  She was willing to try all exercises today and really did not have any setbacks or pain with the exercises which she was having.  I did continue to work on her balance and she did well with this, denmonstrated good ability with her posture with cues.  She wanted to try the traction  again    PT Next Visit Plan assess and progress, add exercises if tolerated next visit. balance training in standing on firm vs airex, continue to add strength activities for a higher function and better quality of life             Patient will benefit from skilled therapeutic intervention in order to improve the following deficits and impairments:  Decreased activity tolerance, Decreased endurance, Decreased range of motion, Decreased strength, Pain, Impaired flexibility, Impaired tone, Postural dysfunction, Impaired sensation, Decreased balance, Improper body mechanics, Increased muscle spasms, Abnormal gait  Visit Diagnosis: Chronic bilateral low back pain without sciatica  Pain in right hip  Acute pain of right knee  Difficulty in walking, not elsewhere classified  Muscle spasm of back  Cervicalgia  Radiculopathy, cervical region  Cervicogenic headache     Problem List Patient Active Problem List   Diagnosis Date Noted   Common migraine with intractable migraine 12/10/2019   Chronic pain 08/13/2017   Left groin pain 08/05/2017   Occipital neuralgia of right side 10/21/2016   Chronic migraine 01/26/2015   Cervicogenic headache 12/16/2014   Subclinical hypothyroidism 12/16/2014   Memory loss 10/08/2014   Depression 10/08/2014   Generalized anxiety disorder 10/08/2014   Tremor 10/08/2014   Major depressive disorder, single episode, moderate ([place]) 08/27/2014   Memory difficulties 08/27/2014   Degenerative disc disease, cervical 08/27/2014   Chronically on opiate therapy 08/27/2014    Sumner Boast., PT 08/04/2020, 9:22 AM  [place]. [place], [place], [address] Phone: [phone]   Fax:  [phone]  Name: Toni [name] MRN: [id number] Date of Birth: Jun 13, 1955

## 2020-08-07 ENCOUNTER — Other Ambulatory Visit: Payer: Self-pay

## 2020-08-07 ENCOUNTER — Ambulatory Visit: Payer: Medicare Other

## 2020-08-07 DIAGNOSIS — G8929 Other chronic pain: Secondary | ICD-10-CM

## 2020-08-07 DIAGNOSIS — M62830 Muscle spasm of back: Secondary | ICD-10-CM

## 2020-08-07 DIAGNOSIS — M25551 Pain in right hip: Secondary | ICD-10-CM

## 2020-08-07 DIAGNOSIS — G4486 Cervicogenic headache: Secondary | ICD-10-CM

## 2020-08-07 DIAGNOSIS — R262 Difficulty in walking, not elsewhere classified: Secondary | ICD-10-CM

## 2020-08-07 DIAGNOSIS — M5412 Radiculopathy, cervical region: Secondary | ICD-10-CM

## 2020-08-07 DIAGNOSIS — M545 Low back pain, unspecified: Secondary | ICD-10-CM | POA: Diagnosis not present

## 2020-08-07 DIAGNOSIS — M25561 Pain in right knee: Secondary | ICD-10-CM

## 2020-08-07 DIAGNOSIS — M542 Cervicalgia: Secondary | ICD-10-CM

## 2020-08-07 NOTE — Therapy (Signed)
[place]. [place], [place], [address] Phone: [phone]   Fax:  [phone]  Physical Therapy Treatment  Patient Details  Name: Brianca Fortenberry MRN: [id number] Date of Birth: [birth year] Referring Provider (PT): Macarthur Critchley Date: 08/07/2020   PT End of Session - 08/07/20 1056     Visit Number 19    Date for PT Re-Evaluation 09/01/20    Authorization Type UHC medicare 10 visit medicare guidelines    PT Start Time 1017    PT Stop Time 1104    PT Time Calculation (min) 47 min    Activity Tolerance Patient tolerated treatment well    Behavior During Therapy [facility] for tasks assessed/performed             Past Medical History:  Diagnosis Date   Brachial neuritis or radiculitis NOS    Chronic hepatitis C ([place]) 10/05/2015   Depressive disorder, not elsewhere classified    Essential and other specified forms of tremor    Fracture of 5th metatarsal 08/13/2017   Fracture of multiple pubic rami ([place]) 08/13/2017   Headache(784.0)    Hepatitis C infection 12/05/2017   Treated and now resolved and "cured " as of early 2018  And      Memory loss    Microscopic hematuria    seed dr Gaynelle Arabian in past   Migraine, unspecified, without mention of intractable migraine without mention of status migrainosus    Multiple rib fractures 08/13/2017   Other specified disorders of rotator cuff syndrome of shoulder and allied disorders    Vulvar pain    rx with valium supp    Past Surgical History:  Procedure Laterality Date   BREAST EXCISIONAL BIOPSY Right    No scar   CESAREAN SECTION     x3   SHOULDER ARTHROSCOPY W/ ROTATOR CUFF REPAIR Right 2004   SHOULDER ARTHROSCOPY W/ ROTATOR CUFF REPAIR Left 2008    There were no vitals filed for this visit.   Subjective Assessment - 08/07/20 1020     Subjective been up early, doing a lot this morning, neck in the same position for a while last night and it was  bothering but put icyhot.    Currently in Pain? Yes    Pain Score 7     Pain Location Neck                               [facility] Adult PT Treatment/Exercise - 08/07/20 0001       High Level Balance   High Level Balance Comments on airex yellow tband scap stab row/ext 2 x 10.   airex reaching and eyes closed      Neck Exercises: Machines for Strengthening   UBE (Upper Arm Bike) L1 x 2 min each way      Neck Exercises: Standing   Other Standing Exercises back to wall small weighted ball overhead lift, W backs      Neck Exercises: Seated   Other Seated Exercise STS on airex with 2# water bar OHP 2 x 10      Lumbar Exercises: Seated   Long Arc Quad on Chair Both;2 sets;10 reps    LAQ on Chair Weights (lbs) 2.5#    Other Seated Lumbar Exercises seated hip abd red TB 2 x 10. seated marches with red TB 2x 10. shorter range within tolerance.  Moist Heat Therapy   Number Minutes Moist Heat 10 Minutes    Moist Heat Location Lumbar Spine   and shoulders     Traction   Type of Traction Cervical    Max (lbs) 10    Rest Time static    Time 10                      PT Short Term Goals - 05/29/20 3893       PT SHORT TERM GOAL #1   Title independent with an initial HEP    Status Achieved               PT Long Term Goals - 08/04/20 0919       PT LONG TERM GOAL #1   Title Patient will report pain decrease to 6 or better out of 10 with functional activities to improve overall function    Status Partially Met                   Plan - 08/07/20 1057     Clinical Impression Statement We were able to continue with exercise progressions as tolerated today. Neck was feeling a bit stiff but she did report feeling things loosening up after trial of UBE. some sway with balance exercises on airex but overall did nicely with this. End of session with heat to shoulders and lumbar to help relax mms, and cervical traction given good repsonse from  previous visit    PT Next Visit Plan assess and progress, add exercises if tolerated next visit. balance training in standing on firm vs airex, continue to add strength activities for a higher function and better quality of life. s going on a short vacation, update progress next visit.    Consulted and Agree with Plan of Care Patient             Patient will benefit from skilled therapeutic intervention in order to improve the following deficits and impairments:  Decreased activity tolerance, Decreased endurance, Decreased range of motion, Decreased strength, Pain, Impaired flexibility, Impaired tone, Postural dysfunction, Impaired sensation, Decreased balance, Improper body mechanics, Increased muscle spasms, Abnormal gait  Visit Diagnosis: Chronic bilateral low back pain without sciatica  Pain in right hip  Acute pain of right knee  Difficulty in walking, not elsewhere classified  Muscle spasm of back  Cervicalgia  Radiculopathy, cervical region  Cervicogenic headache     Problem List Patient Active Problem List   Diagnosis Date Noted   Common migraine with intractable migraine 12/10/2019   Chronic pain 08/13/2017   Left groin pain 08/05/2017   Occipital neuralgia of right side 10/21/2016   Chronic migraine 01/26/2015   Cervicogenic headache 12/16/2014   Subclinical hypothyroidism 12/16/2014   Memory loss 10/08/2014   Depression 10/08/2014   Generalized anxiety disorder 10/08/2014   Tremor 10/08/2014   Major depressive disorder, single episode, moderate ([place]) 08/27/2014   Memory difficulties 08/27/2014   Degenerative disc disease, cervical 08/27/2014   Chronically on opiate therapy 08/27/2014    Hall Busing, PT, DPT 08/07/2020, 11:10 AM  [place]. [place], [place], [address] Phone: [phone]   Fax:  [phone]  Name: Marylu Dudenhoeffer MRN: [id number] Date of Birth:  04/04/1955

## 2020-08-14 ENCOUNTER — Ambulatory Visit: Payer: Medicare Other

## 2020-08-19 ENCOUNTER — Other Ambulatory Visit: Payer: Self-pay

## 2020-08-19 ENCOUNTER — Ambulatory Visit: Payer: Medicare Other

## 2020-08-19 DIAGNOSIS — M25561 Pain in right knee: Secondary | ICD-10-CM

## 2020-08-19 DIAGNOSIS — R262 Difficulty in walking, not elsewhere classified: Secondary | ICD-10-CM

## 2020-08-19 DIAGNOSIS — G8929 Other chronic pain: Secondary | ICD-10-CM

## 2020-08-19 DIAGNOSIS — M62830 Muscle spasm of back: Secondary | ICD-10-CM

## 2020-08-19 DIAGNOSIS — G4486 Cervicogenic headache: Secondary | ICD-10-CM

## 2020-08-19 DIAGNOSIS — M545 Low back pain, unspecified: Secondary | ICD-10-CM

## 2020-08-19 DIAGNOSIS — M25551 Pain in right hip: Secondary | ICD-10-CM

## 2020-08-19 DIAGNOSIS — M542 Cervicalgia: Secondary | ICD-10-CM

## 2020-08-19 DIAGNOSIS — M5412 Radiculopathy, cervical region: Secondary | ICD-10-CM

## 2020-08-19 NOTE — Therapy (Addendum)
[place]. [place], [place], [address] Phone: [phone]   Fax:  [phone]  Physical Therapy Treatment Progress Note Reporting Period 06/29/20 - 08/19/20 for visits 11-20  See note below for Objective Data and Assessment of Progress/Goals.      Patient Details  Name: Toni [name] MRN: [id number] Date of Birth: 12/27/1955 Referring Provider (PT): Vertell Limber   Encounter Date: 08/19/2020   PT End of Session - 08/19/20 1827     Visit Number 20    Date for PT Re-Evaluation 09/01/20    Authorization Type UHC medicare 10 visit medicare guidelines    PT Start Time 6237    PT Stop Time 1900    PT Time Calculation (min) 39 min    Activity Tolerance Patient tolerated treatment well    Behavior During Therapy [facility] for tasks assessed/performed             Past Medical History:  Diagnosis Date   Brachial neuritis or radiculitis NOS    Chronic hepatitis C ([place]) 10/05/2015   Depressive disorder, not elsewhere classified    Essential and other specified forms of tremor    Fracture of 5th metatarsal 08/13/2017   Fracture of multiple pubic rami ([place]) 08/13/2017   Headache(784.0)    Hepatitis C infection 12/05/2017   Treated and now resolved and "cured " as of early 2018  And      Memory loss    Microscopic hematuria    seed dr Gaynelle Arabian in past   Migraine, unspecified, without mention of intractable migraine without mention of status migrainosus    Multiple rib fractures 08/13/2017   Other specified disorders of rotator cuff syndrome of shoulder and allied disorders    Vulvar pain    rx with valium supp    Past Surgical History:  Procedure Laterality Date   BREAST EXCISIONAL BIOPSY Right    No scar   CESAREAN SECTION     x3   SHOULDER ARTHROSCOPY W/ ROTATOR CUFF REPAIR Right 2004   SHOULDER ARTHROSCOPY W/ ROTATOR CUFF REPAIR Left 2008    There were no vitals filed for this visit.   Subjective  Assessment - 08/19/20 1823     Subjective Saw Dr Vertell Limber and reports he put another order in for PT, she states he was impressed by how she is doing. She  Has been doing more, went on a trip and kayaking. Did nothing today. 2 days ago woke up and didnt hurt at all. Occipital neuralgia still flares up - also with continued stressors which increase pain. Overall having more good days when it comes to movement   Currently in Pain? Yes    Pain Score 7     Pain Location Head                               [facility] Adult PT Treatment/Exercise - 08/19/20 0001       Neck Exercises: Machines for Strengthening   Nustep L3 x 3 minutes      Neck Exercises: Seated   Other Seated Exercise STS on airex with 2# wate bar OHP x 10      Lumbar Exercises: Stretches   Other Lumbar Stretch Exercise pball roll outs x 10      Knee/Hip Exercises: Seated   Long Arc Quad Strengthening;Both;2 sets;15 reps    Long Arc Quad Weight --   2.5#   Clamshell  with TheraBand Red   2 x 15   Marching Strengthening;Both;2 sets;15 reps      Cryotherapy   Type of Cryotherapy Ice pack   5 minutes end of session for back of head and right knee                     PT Short Term Goals - 05/29/20 0017       PT SHORT TERM GOAL #1   Title independent with an initial HEP    Status Achieved               PT Long Term Goals - 08/19/20 1833       PT LONG TERM GOAL #1   Title Patient will report pain decrease to 6 or better out of 10 with functional activities to improve overall function    Status Partially Met      PT LONG TERM GOAL #2   Title increase right LE strength to 4/5    Status On-going      PT LONG TERM GOAL #3   Title increase lumbar ROM 25%    Status Partially Met      PT LONG TERM GOAL #4   Title walk with a minimal deviation    Status On-going                   Plan - 08/19/20 1856     Clinical Impression Statement Pt continues to report improved  functional mobility although she still does have worse days as it pertains to pain. neck and back overall moving a bit more and strength progressing gradually as she is more tolerance to exercises in session. She does report on her recent trip she was able to do more, play with grandson a bit, and continues to feel like PT is helping. Occipital neuralgia/head pain continues to have flareups and continues to be more bothersome. Also  Limited by some right knee pain during exercises so ice applied to L knee end of session which provided some relief. She continues to make some nice progress and seems to be having more good days with improved function, plan to continue per POC at this time    PT Treatment/Interventions ADLs/Self Care Home Management;Moist Heat;Therapeutic activities;Therapeutic exercise;Neuromuscular re-education;Patient/family education;Manual techniques;Passive range of motion;Joint Manipulations;Other (comment);Traction;Dry needling;Electrical Stimulation;Cryotherapy    PT Next Visit Plan assess and progress, add exercises if tolerated next visit. balance training in standing on firm vs airex, continue to add strength activities for a higher function and better quality of life.    Consulted and Agree with Plan of Care Patient             Patient will benefit from skilled therapeutic intervention in order to improve the following deficits and impairments:  Decreased activity tolerance, Decreased endurance, Decreased range of motion, Decreased strength, Pain, Impaired flexibility, Impaired tone, Postural dysfunction, Impaired sensation, Decreased balance, Improper body mechanics, Increased muscle spasms, Abnormal gait  Visit Diagnosis: Chronic bilateral low back pain without sciatica  Pain in right hip  Acute pain of right knee  Difficulty in walking, not elsewhere classified  Muscle spasm of back  Cervicalgia  Radiculopathy, cervical region  Cervicogenic  headache     Problem List Patient Active Problem List   Diagnosis Date Noted   Common migraine with intractable migraine 12/10/2019   Chronic pain 08/13/2017   Left groin pain 08/05/2017   Occipital neuralgia of right side 10/21/2016   Chronic migraine 01/26/2015  Cervicogenic headache 12/16/2014   Subclinical hypothyroidism 12/16/2014   Memory loss 10/08/2014   Depression 10/08/2014   Generalized anxiety disorder 10/08/2014   Tremor 10/08/2014   Major depressive disorder, single episode, moderate ([place]) 08/27/2014   Memory difficulties 08/27/2014   Degenerative disc disease, cervical 08/27/2014   Chronically on opiate therapy 08/27/2014    Hall Busing, PT, DPT 08/19/2020, 7:07 PM  [place]. [place], [place], [address] Phone: [phone]   Fax:  [phone]  Name: Toni [name] MRN: [id number] Date of Birth: 1955-06-04

## 2020-08-21 ENCOUNTER — Ambulatory Visit: Payer: Medicare Other | Admitting: Physical Therapy

## 2020-08-21 ENCOUNTER — Ambulatory Visit: Payer: Medicare Other

## 2020-08-21 ENCOUNTER — Encounter: Payer: Self-pay | Admitting: Physical Therapy

## 2020-08-21 ENCOUNTER — Other Ambulatory Visit: Payer: Self-pay

## 2020-08-21 DIAGNOSIS — M545 Low back pain, unspecified: Secondary | ICD-10-CM | POA: Diagnosis not present

## 2020-08-21 DIAGNOSIS — G4486 Cervicogenic headache: Secondary | ICD-10-CM

## 2020-08-21 DIAGNOSIS — M542 Cervicalgia: Secondary | ICD-10-CM

## 2020-08-21 DIAGNOSIS — M25561 Pain in right knee: Secondary | ICD-10-CM

## 2020-08-21 DIAGNOSIS — G8929 Other chronic pain: Secondary | ICD-10-CM

## 2020-08-21 DIAGNOSIS — M5412 Radiculopathy, cervical region: Secondary | ICD-10-CM

## 2020-08-21 DIAGNOSIS — M25551 Pain in right hip: Secondary | ICD-10-CM

## 2020-08-21 DIAGNOSIS — M62830 Muscle spasm of back: Secondary | ICD-10-CM

## 2020-08-21 DIAGNOSIS — R262 Difficulty in walking, not elsewhere classified: Secondary | ICD-10-CM

## 2020-08-21 NOTE — Therapy (Signed)
[place]. [place], [place], [address] Phone: [phone]   Fax:  [phone]  Physical Therapy Treatment  Patient Details  Name: Toni [name] MRN: [id number] Date of Birth: 05-03-1955 Referring Provider (PT): Vertell Limber   Encounter Date: 08/21/2020   PT End of Session - 08/21/20 0906     Visit Number 21    Date for PT Re-Evaluation 09/01/20    Authorization Type UHC medicare 10 visit medicare guidelines    Progress Note Due on Visit 10    PT Start Time 0800    PT Stop Time 0849    PT Time Calculation (min) 49 min    Activity Tolerance Patient tolerated treatment well    Behavior During Therapy [facility] for tasks assessed/performed             Past Medical History:  Diagnosis Date   Brachial neuritis or radiculitis NOS    Chronic hepatitis C ([place]) 10/05/2015   Depressive disorder, not elsewhere classified    Essential and other specified forms of tremor    Fracture of 5th metatarsal 08/13/2017   Fracture of multiple pubic rami ([place]) 08/13/2017   Headache(784.0)    Hepatitis C infection 12/05/2017   Treated and now resolved and "cured " as of early 2018  And      Memory loss    Microscopic hematuria    seed dr Gaynelle Arabian in past   Migraine, unspecified, without mention of intractable migraine without mention of status migrainosus    Multiple rib fractures 08/13/2017   Other specified disorders of rotator cuff syndrome of shoulder and allied disorders    Vulvar pain    rx with valium supp    Past Surgical History:  Procedure Laterality Date   BREAST EXCISIONAL BIOPSY Right    No scar   CESAREAN SECTION     x3   SHOULDER ARTHROSCOPY W/ ROTATOR CUFF REPAIR Right 2004   SHOULDER ARTHROSCOPY W/ ROTATOR CUFF REPAIR Left 2008    There were no vitals filed for this visit.   Subjective Assessment - 08/21/20 0807     Subjective Patient reports that she woke up late and had to rush and hurry, she  reports stress and very stiff and sore in the AM and this compounded that    Currently in Pain? Yes    Pain Score 6     Pain Location Neck    Pain Orientation Right;Left    Pain Descriptors / Indicators Spasm;Tightness;Sore    Aggravating Factors  stress    Pain Relieving Factors this has really helped me overall, doing more and tolerating more                               [facility] Adult PT Treatment/Exercise - 08/21/20 0001       Neck Exercises: Machines for Strengthening   Nustep L3 x 3 minutes      Neck Exercises: Seated   Other Seated Exercise STS on airex with 2# wate bar OHP x 10      Lumbar Exercises: Stretches   Other Lumbar Stretch Exercise pball roll outs x 10      Lumbar Exercises: Seated   Other Seated Lumbar Exercises seated hip abd red TB 2 x 10. seated marches with red TB 2x 10. shorter range within tolerance.      Lumbar Exercises: Supine   Other Supine Lumbar Exercises feet on  ball K2C x 10, small bridges x10, trunk rotation, isometrics abs      Manual Therapy   Manual Therapy Soft tissue mobilization;Manual Traction    Soft tissue mobilization bilateral upper trap and cervical area    Manual Traction gentle occipital release, seated                      PT Short Term Goals - 05/29/20 4782       PT SHORT TERM GOAL #1   Title independent with an initial HEP    Status Achieved               PT Long Term Goals - 08/19/20 1833       PT LONG TERM GOAL #1   Title Patient will report pain decrease to 6 or better out of 10 with functional activities to improve overall function    Status Partially Met      PT LONG TERM GOAL #2   Title increase right LE strength to 4/5    Status On-going      PT LONG TERM GOAL #3   Title increase lumbar ROM 25%    Status Partially Met      PT LONG TERM GOAL #4   Title walk with a minimal deviation    Status On-going                   Plan - 08/21/20 0906      Clinical Impression Statement Patient was very rushed this AM and comes in with more stress and stiffness with c/o increase pain in the neck and upper traps, she does have a lot of knots here still with tenderess.  We are working to progress her function and quality of life with her having less pain overall and able to tolerate more activities    PT Next Visit Plan assess and progress, add exercises if tolerated next visit. balance training in standing on firm vs airex, continue to add strength activities for a higher function and better quality of life.    Consulted and Agree with Plan of Care Patient             Patient will benefit from skilled therapeutic intervention in order to improve the following deficits and impairments:  Decreased activity tolerance, Decreased endurance, Decreased range of motion, Decreased strength, Pain, Impaired flexibility, Impaired tone, Postural dysfunction, Impaired sensation, Decreased balance, Improper body mechanics, Increased muscle spasms, Abnormal gait  Visit Diagnosis: Chronic bilateral low back pain without sciatica  Pain in right hip  Acute pain of right knee  Difficulty in walking, not elsewhere classified  Muscle spasm of back  Cervicalgia  Radiculopathy, cervical region  Cervicogenic headache     Problem List Patient Active Problem List   Diagnosis Date Noted   Common migraine with intractable migraine 12/10/2019   Chronic pain 08/13/2017   Left groin pain 08/05/2017   Occipital neuralgia of right side 10/21/2016   Chronic migraine 01/26/2015   Cervicogenic headache 12/16/2014   Subclinical hypothyroidism 12/16/2014   Memory loss 10/08/2014   Depression 10/08/2014   Generalized anxiety disorder 10/08/2014   Tremor 10/08/2014   Major depressive disorder, single episode, moderate ([place]) 08/27/2014   Memory difficulties 08/27/2014   Degenerative disc disease, cervical 08/27/2014   Chronically on opiate therapy 08/27/2014     Sumner Boast., PT 08/21/2020, 9:08 AM  [place]. [place], [place], [address] Phone:  [phone]   Fax:  [phone]  Name: Toni [name] MRN: [id number] Date of Birth: 04-14-1955

## 2020-08-24 ENCOUNTER — Other Ambulatory Visit: Payer: Self-pay

## 2020-08-24 ENCOUNTER — Ambulatory Visit: Payer: Medicare Other

## 2020-08-24 DIAGNOSIS — M5412 Radiculopathy, cervical region: Secondary | ICD-10-CM

## 2020-08-24 DIAGNOSIS — M25561 Pain in right knee: Secondary | ICD-10-CM

## 2020-08-24 DIAGNOSIS — M542 Cervicalgia: Secondary | ICD-10-CM

## 2020-08-24 DIAGNOSIS — M25551 Pain in right hip: Secondary | ICD-10-CM

## 2020-08-24 DIAGNOSIS — M62830 Muscle spasm of back: Secondary | ICD-10-CM

## 2020-08-24 DIAGNOSIS — G8929 Other chronic pain: Secondary | ICD-10-CM

## 2020-08-24 DIAGNOSIS — M545 Low back pain, unspecified: Secondary | ICD-10-CM | POA: Diagnosis not present

## 2020-08-24 DIAGNOSIS — R262 Difficulty in walking, not elsewhere classified: Secondary | ICD-10-CM

## 2020-08-24 NOTE — Therapy (Signed)
[place] [place], [place], [address] Phone: [phone]   Fax:  [phone]  Physical Therapy Treatment  Patient Details  Name: Toni [name] MRN: [id number] Date of Birth: [birth year] Referring Provider (PT): Vertell Limber   Encounter Date: 08/24/2020   PT End of Session - 08/24/20 1738     Visit Number 22    Date for PT Re-Evaluation 09/01/20    Authorization Type UHC medicare 10 visit medicare guidelines    Progress Note Due on Visit 10    PT Start Time 7619    PT Stop Time 1815    PT Time Calculation (min) 40 min    Activity Tolerance Patient tolerated treatment well    Behavior During Therapy [facility] for tasks assessed/performed             Past Medical History:  Diagnosis Date   Brachial neuritis or radiculitis NOS    Chronic hepatitis C ([place]) 10/05/2015   Depressive disorder, not elsewhere classified    Essential and other specified forms of tremor    Fracture of 5th metatarsal 08/13/2017   Fracture of multiple pubic rami ([place]) 08/13/2017   Headache(784.0)    Hepatitis C infection 12/05/2017   Treated and now resolved and "cured " as of early 2018  And      Memory loss    Microscopic hematuria    seed dr Gaynelle Arabian in past   Migraine, unspecified, without mention of intractable migraine without mention of status migrainosus    Multiple rib fractures 08/13/2017   Other specified disorders of rotator cuff syndrome of shoulder and allied disorders    Vulvar pain    rx with valium supp    Past Surgical History:  Procedure Laterality Date   BREAST EXCISIONAL BIOPSY Right    No scar   CESAREAN SECTION     x3   SHOULDER ARTHROSCOPY W/ ROTATOR CUFF REPAIR Right 2004   SHOULDER ARTHROSCOPY W/ ROTATOR CUFF REPAIR Left 2008    There were no vitals filed for this visit.   Subjective Assessment - 08/24/20 1737     Subjective has been running all day, on feet on firm surface that increases pain     Currently in Pain? Yes    Pain Score 5     Pain Location Knee    Pain Orientation Left                               [facility] Adult PT Treatment/Exercise - 08/24/20 0001       Neck Exercises: Machines for Strengthening   Nustep L3 x 4 minutes      Neck Exercises: Seated   Other Seated Exercise STS on airex with 3# wate bar OHP x10, x 5      Lumbar Exercises: Supine   Other Supine Lumbar Exercises feet on ball K2C x 10, small bridges x10, trunk rotation, isometrics abs. supine marches 2 x 10. bent knee fallouts 2 x 10 B alt      Knee/Hip Exercises: Standing   Walking with Sports Cord 20# x 3 forward, and sidestepping - needed PT assist for first rep of each d/t instability.      Moist Heat Therapy   Number Minutes Moist Heat 10 Minutes    Moist Heat Location Cervical;Lumbar Spine   5 minutes during supine exercises, 5 minutes alone end of session  PT Short Term Goals - 05/29/20 6962       PT SHORT TERM GOAL #1   Title independent with an initial HEP    Status Achieved               PT Long Term Goals - 08/19/20 1833       PT LONG TERM GOAL #1   Title Patient will report pain decrease to 6 or better out of 10 with functional activities to improve overall function    Status Partially Met      PT LONG TERM GOAL #2   Title increase right LE strength to 4/5    Status On-going      PT LONG TERM GOAL #3   Title increase lumbar ROM 25%    Status Partially Met      PT LONG TERM GOAL #4   Title walk with a minimal deviation    Status On-going                   Plan - 08/24/20 1809     Clinical Impression Statement Pt tolerated all interventions nicely today. Did c/o some increased knee pain she has had so we modified knee bending exercises as needed to improve tolerance. added some resisted gait to work on functional strength and balance, first rep in each direction required cues for step taking  forward/sideways concentrically but had difficulty controlling return to start/eccentric requiring PT assist. This was more effortful but she did not state any increased pain.    PT Next Visit Plan assess and progress, add exercises if tolerated next visit. balance training in standing on firm vs airex, continue to add strength activities for a higher function and better quality of life.    Consulted and Agree with Plan of Care Patient             Patient will benefit from skilled therapeutic intervention in order to improve the following deficits and impairments:  Decreased activity tolerance, Decreased endurance, Decreased range of motion, Decreased strength, Pain, Impaired flexibility, Impaired tone, Postural dysfunction, Impaired sensation, Decreased balance, Improper body mechanics, Increased muscle spasms, Abnormal gait  Visit Diagnosis: Chronic bilateral low back pain without sciatica  Pain in right hip  Acute pain of right knee  Difficulty in walking, not elsewhere classified  Muscle spasm of back  Cervicalgia  Radiculopathy, cervical region     Problem List Patient Active Problem List   Diagnosis Date Noted   Common migraine with intractable migraine 12/10/2019   Chronic pain 08/13/2017   Left groin pain 08/05/2017   Occipital neuralgia of right side 10/21/2016   Chronic migraine 01/26/2015   Cervicogenic headache 12/16/2014   Subclinical hypothyroidism 12/16/2014   Memory loss 10/08/2014   Depression 10/08/2014   Generalized anxiety disorder 10/08/2014   Tremor 10/08/2014   Major depressive disorder, single episode, moderate ([place]) 08/27/2014   Memory difficulties 08/27/2014   Degenerative disc disease, cervical 08/27/2014   Chronically on opiate therapy 08/27/2014    Hall Busing, PT, DPT 08/24/2020, 6:13 PM  [place]. [place], [place], [address] Phone: [phone]   Fax:   [phone]  Name: Toni [name] MRN: [id number] Date of Birth: 04-06-1955

## 2020-08-28 ENCOUNTER — Other Ambulatory Visit: Payer: Self-pay

## 2020-08-28 ENCOUNTER — Ambulatory Visit: Payer: Medicare Other

## 2020-08-28 DIAGNOSIS — M5412 Radiculopathy, cervical region: Secondary | ICD-10-CM

## 2020-08-28 DIAGNOSIS — M25551 Pain in right hip: Secondary | ICD-10-CM

## 2020-08-28 DIAGNOSIS — M62830 Muscle spasm of back: Secondary | ICD-10-CM

## 2020-08-28 DIAGNOSIS — M25561 Pain in right knee: Secondary | ICD-10-CM

## 2020-08-28 DIAGNOSIS — M545 Low back pain, unspecified: Secondary | ICD-10-CM | POA: Diagnosis not present

## 2020-08-28 DIAGNOSIS — G8929 Other chronic pain: Secondary | ICD-10-CM

## 2020-08-28 DIAGNOSIS — M542 Cervicalgia: Secondary | ICD-10-CM

## 2020-08-28 DIAGNOSIS — R262 Difficulty in walking, not elsewhere classified: Secondary | ICD-10-CM

## 2020-08-28 NOTE — Therapy (Signed)
[place]. [place], [place], [address] Phone: [phone]   Fax:  [phone]  Physical Therapy Treatment  Patient Details  Name: Toni [name] MRN: [id number] Date of Birth: 1955/12/19 Referring Provider (PT): Macarthur Critchley Date: 08/28/2020   PT End of Session - 08/28/20 0814     Visit Number 23    Date for PT Re-Evaluation 09/01/20    Authorization Type UHC medicare 10 visit medicare guidelines    Progress Note Due on Visit 10    PT Start Time 0803    PT Stop Time 0850   5 min MHP/Ice end of session   PT Time Calculation (min) 47 min    Activity Tolerance Patient tolerated treatment well    Behavior During Therapy [facility] for tasks assessed/performed             Past Medical History:  Diagnosis Date   Brachial neuritis or radiculitis NOS    Chronic hepatitis C ([place]) 10/05/2015   Depressive disorder, not elsewhere classified    Essential and other specified forms of tremor    Fracture of 5th metatarsal 08/13/2017   Fracture of multiple pubic rami ([place]) 08/13/2017   Headache(784.0)    Hepatitis C infection 12/05/2017   Treated and now resolved and "cured " as of early 2018  And      Memory loss    Microscopic hematuria    seed dr Gaynelle Arabian in past   Migraine, unspecified, without mention of intractable migraine without mention of status migrainosus    Multiple rib fractures 08/13/2017   Other specified disorders of rotator cuff syndrome of shoulder and allied disorders    Vulvar pain    rx with valium supp    Past Surgical History:  Procedure Laterality Date   BREAST EXCISIONAL BIOPSY Right    No scar   CESAREAN SECTION     x3   SHOULDER ARTHROSCOPY W/ ROTATOR CUFF REPAIR Right 2004   SHOULDER ARTHROSCOPY W/ ROTATOR CUFF REPAIR Left 2008    There were no vitals filed for this visit.   Subjective Assessment - 08/28/20 0806     Subjective Head is hurting moreso today. heel and  thumb also hurts today.    Currently in Pain? Yes    Pain Score 7     Pain Location Heel    Pain Orientation Left                               [facility] Adult PT Treatment/Exercise - 08/28/20 0001       Neck Exercises: Machines for Strengthening   Other Machines for Strengthening recumbent bike L3 x 3 minutes - limited d/t knee pain      Neck Exercises: Supine   Other Supine Exercise yellow TB shoulder ER 2x 15, red TB lat pull down/shoulder ext x 15      Lumbar Exercises: Supine   Other Supine Lumbar Exercises feet on ball K2C x 10, small bridges 2 x 15, trunk rotation x 15, isometrics abs 2x 15. supine clamshells x15 green TB. supine marches 2 x 15.  bent knee fallouts 2 x 10 B alt      Moist Heat Therapy   Number Minutes Moist Heat 5 Minutes    Moist Heat Location Cervical   5 minutes to lower cervical, neck, UT     Cryotherapy   Number Minutes Cryotherapy 5 Minutes  Type of Cryotherapy Ice pack   occiput     Manual Therapy   Manual Therapy Soft tissue mobilization;Manual Traction    Manual therapy comments gentle first rib mobs, provided some relief to right neck tension/pain.    Soft tissue mobilization bilateral upper trap and cervical area. Chin tucks/cervical retraction with suboccipital, paraspinal STM lower cervical spine.    Manual Traction gentle occipital release, supine.                      PT Short Term Goals - 05/29/20 7353       PT SHORT TERM GOAL #1   Title independent with an initial HEP    Status Achieved               PT Long Term Goals - 08/19/20 1833       PT LONG TERM GOAL #1   Title Patient will report pain decrease to 6 or better out of 10 with functional activities to improve overall function    Status Partially Met      PT LONG TERM GOAL #2   Title increase right LE strength to 4/5    Status On-going      PT LONG TERM GOAL #3   Title increase lumbar ROM 25%    Status Partially Met      PT LONG  TERM GOAL #4   Title walk with a minimal deviation    Status On-going                   Plan - 08/28/20 0815     Clinical Impression Statement Pt entered clinic today with report of increased head and neck pain, pt with increased heel pain as well today.   Pt does report she enjoys coming to therapy, feels it helps. Given higher irritability with standing and seated on the neck and upper body, opted for supine strength and mobility exercises for today. She tolerated supine strengthening activities nicely, and we discussed in depth importance of strength, stabilization to help with controlling motion and supporting joints. MT to neck end of session revealed tender areas, knots in the right posterior/lateral neck near area of first rib and lower cervical, respondd nicely to gentle mobs, STM and manual stretching.    PT Next Visit Plan assess and progress, add exercises as tolerated next visit- more cervical stabilization exercises for DNF?  balance training, continue to add strength activities for a higher function and better quality of life. RECERT NEXT VISIT WITH NEW REFERRRAL FROM MD    Consulted and Agree with Plan of Care Patient             Patient will benefit from skilled therapeutic intervention in order to improve the following deficits and impairments:  Decreased activity tolerance, Decreased endurance, Decreased range of motion, Decreased strength, Pain, Impaired flexibility, Impaired tone, Postural dysfunction, Impaired sensation, Decreased balance, Improper body mechanics, Increased muscle spasms, Abnormal gait  Visit Diagnosis: Chronic bilateral low back pain without sciatica  Pain in right hip  Acute pain of right knee  Difficulty in walking, not elsewhere classified  Muscle spasm of back  Cervicalgia  Radiculopathy, cervical region     Problem List Patient Active Problem List   Diagnosis Date Noted   Common migraine with intractable migraine 12/10/2019    Chronic pain 08/13/2017   Left groin pain 08/05/2017   Occipital neuralgia of right side 10/21/2016   Chronic migraine 01/26/2015   Cervicogenic headache  12/16/2014   Subclinical hypothyroidism 12/16/2014   Memory loss 10/08/2014   Depression 10/08/2014   Generalized anxiety disorder 10/08/2014   Tremor 10/08/2014   Major depressive disorder, single episode, moderate ([place]) 08/27/2014   Memory difficulties 08/27/2014   Degenerative disc disease, cervical 08/27/2014   Chronically on opiate therapy 08/27/2014    Hall Busing, PT, DPT 08/28/2020, 8:59 AM  [place]. [place], [place], [address] Phone: [phone]   Fax:  [phone]  Name: Vani Gunner MRN: [id number] Date of Birth: Feb 28, 1955

## 2020-08-31 ENCOUNTER — Other Ambulatory Visit: Payer: Self-pay

## 2020-08-31 ENCOUNTER — Ambulatory Visit: Payer: Medicare Other

## 2020-08-31 DIAGNOSIS — M25561 Pain in right knee: Secondary | ICD-10-CM

## 2020-08-31 DIAGNOSIS — G8929 Other chronic pain: Secondary | ICD-10-CM

## 2020-08-31 DIAGNOSIS — M62830 Muscle spasm of back: Secondary | ICD-10-CM

## 2020-08-31 DIAGNOSIS — R262 Difficulty in walking, not elsewhere classified: Secondary | ICD-10-CM

## 2020-08-31 DIAGNOSIS — M542 Cervicalgia: Secondary | ICD-10-CM

## 2020-08-31 DIAGNOSIS — M5412 Radiculopathy, cervical region: Secondary | ICD-10-CM

## 2020-08-31 DIAGNOSIS — M545 Low back pain, unspecified: Secondary | ICD-10-CM | POA: Diagnosis not present

## 2020-08-31 DIAGNOSIS — M25551 Pain in right hip: Secondary | ICD-10-CM

## 2020-09-01 NOTE — Therapy (Signed)
[place]. [place], [place], [address] Phone: [phone]   Fax:  [phone]  Physical Therapy Treatment/ Recertification  Patient Details  Name: Toni [name] MRN: [id number] Date of Birth: 1955/10/14 Referring Provider (PT): Vertell Limber   Encounter Date: 08/31/2020   PT End of Session - 08/31/20 1607     Visit Number 24    Date for PT Re-Evaluation 10/02/20    Authorization Type UHC medicare 10 visit medicare guidelines    Progress Note Due on Visit 10    PT Start Time 1603    PT Stop Time 1645    PT Time Calculation (min) 42 min    Activity Tolerance Patient tolerated treatment well    Behavior During Therapy [facility] for tasks assessed/performed             Past Medical History:  Diagnosis Date   Brachial neuritis or radiculitis NOS    Chronic hepatitis C ([place]) 10/05/2015   Depressive disorder, not elsewhere classified    Essential and other specified forms of tremor    Fracture of 5th metatarsal 08/13/2017   Fracture of multiple pubic rami ([place]) 08/13/2017   Headache(784.0)    Hepatitis C infection 12/05/2017   Treated and now resolved and "cured " as of early 2018  And      Memory loss    Microscopic hematuria    seed dr Gaynelle Arabian in past   Migraine, unspecified, without mention of intractable migraine without mention of status migrainosus    Multiple rib fractures 08/13/2017   Other specified disorders of rotator cuff syndrome of shoulder and allied disorders    Vulvar pain    rx with valium supp    Past Surgical History:  Procedure Laterality Date   BREAST EXCISIONAL BIOPSY Right    No scar   CESAREAN SECTION     x3   SHOULDER ARTHROSCOPY W/ ROTATOR CUFF REPAIR Right 2004   SHOULDER ARTHROSCOPY W/ ROTATOR CUFF REPAIR Left 2008    There were no vitals filed for this visit.   Subjective Assessment - 08/31/20 1603     Subjective Doing okay, a little shaky because running around  before she came in.    Currently in Pain? Yes    Pain Score 7     Pain Location Head                               [facility] Adult PT Treatment/Exercise - 08/31/20 1920       Neck Exercises: Machines for Strengthening   Nustep L5 x 5 min    Cybex Row 20# 2 x 10    Lat Pull 20# x 10, 25# x 10      Neck Exercises: Supine   Capital Flexion --   chin tuck + head lift 3" , 2 x 5.   Cervical Rotation --   chin tuck + rotation L/R 2 x 5   Other Supine Exercise bridge on ball 2x10 holding 2# DBs , DKTC on ball 2 x 15     Knee/Hip Exercises: Standing   Walking with Sports Cord 20# x3 all directions- needed PT assist for first rep of each d/t instability.             ice pack to occiput 8 minutes: during supine,          PT Short Term Goals - 05/29/20 8563  PT SHORT TERM GOAL #1   Title independent with an initial HEP    Status Achieved               PT Long Term Goals - 08/31/20 1641       PT LONG TERM GOAL #1   Title Patient will report pain decrease to 6 or better out of 10 with functional activities to improve overall function    Status Partially Met   pain can still get high but able to do more functionally     PT LONG TERM GOAL #2   Title increase right LE strength to 4/5    Status On-going      PT LONG TERM GOAL #3   Title increase lumbar ROM 25%    Status Partially Met      PT LONG TERM GOAL #4   Title walk with a minimal deviation    Status On-going   limited by balance, heel pain and knee pain                  Plan - 08/31/20 1608     Clinical Impression Statement Pt tolerated interventions very well today and Pt is making good funcitonal progresstowards goals. Overall pain is less intense, although pain still can be high (especially occipital neuralgia)  and she reports feeling as though she is able to do more now than before therapy. She is able to tolerate more during therapy session at this time and  demonstrates improvements in core and LE strength. Balance gradually improving as demosntrated by improved tolerance with standing exercises, no LOB with resisted gai today. She will benefit from continued skilled PT to work further on improving functional strength, balance and gait; New Script was sent over by MD office to continue PT as well.    PT Next Visit Plan assess and progress, add exercises as tolerated next visit- more cervical stabilization exercises for DNF?  balance training, continue to add strength activities for a higher function and better quality of life.    Consulted and Agree with Plan of Care Patient             Patient will benefit from skilled therapeutic intervention in order to improve the following deficits and impairments:  Decreased activity tolerance, Decreased endurance, Decreased range of motion, Decreased strength, Pain, Impaired flexibility, Impaired tone, Postural dysfunction, Impaired sensation, Decreased balance, Improper body mechanics, Increased muscle spasms, Abnormal gait  Visit Diagnosis: Chronic bilateral low back pain without sciatica  Pain in right hip  Acute pain of right knee  Difficulty in walking, not elsewhere classified  Muscle spasm of back  Cervicalgia  Radiculopathy, cervical region     Problem List Patient Active Problem List   Diagnosis Date Noted   Common migraine with intractable migraine 12/10/2019   Chronic pain 08/13/2017   Left groin pain 08/05/2017   Occipital neuralgia of right side 10/21/2016   Chronic migraine 01/26/2015   Cervicogenic headache 12/16/2014   Subclinical hypothyroidism 12/16/2014   Memory loss 10/08/2014   Depression 10/08/2014   Generalized anxiety disorder 10/08/2014   Tremor 10/08/2014   Major depressive disorder, single episode, moderate ([place]) 08/27/2014   Memory difficulties 08/27/2014   Degenerative disc disease, cervical 08/27/2014   Chronically on opiate therapy 08/27/2014     Hall Busing, PT, DPT 09/01/2020, 10:40 AM  [place]. [place], [place], [address] Phone: [phone]   Fax:  [phone]  Name: Toni [name] MRN: [id number] Date of Birth: August 30, 1955

## 2020-09-02 ENCOUNTER — Ambulatory Visit: Payer: Medicare Other

## 2020-09-02 ENCOUNTER — Other Ambulatory Visit: Payer: Self-pay

## 2020-09-02 DIAGNOSIS — G4486 Cervicogenic headache: Secondary | ICD-10-CM

## 2020-09-02 DIAGNOSIS — M62830 Muscle spasm of back: Secondary | ICD-10-CM

## 2020-09-02 DIAGNOSIS — M25561 Pain in right knee: Secondary | ICD-10-CM

## 2020-09-02 DIAGNOSIS — M545 Low back pain, unspecified: Secondary | ICD-10-CM

## 2020-09-02 DIAGNOSIS — G8929 Other chronic pain: Secondary | ICD-10-CM

## 2020-09-02 DIAGNOSIS — M5412 Radiculopathy, cervical region: Secondary | ICD-10-CM

## 2020-09-02 DIAGNOSIS — M25551 Pain in right hip: Secondary | ICD-10-CM

## 2020-09-02 DIAGNOSIS — R262 Difficulty in walking, not elsewhere classified: Secondary | ICD-10-CM

## 2020-09-02 DIAGNOSIS — M542 Cervicalgia: Secondary | ICD-10-CM

## 2020-09-02 NOTE — Therapy (Signed)
[place]. [place], [place], [address] Phone: [phone]   Fax:  [phone]  Physical Therapy Treatment  Patient Details  Name: Toni [name] MRN: [id number] Date of Birth: 1955/03/19 Referring Provider (PT): Macarthur Critchley Date: 09/02/2020   PT End of Session - 09/02/20 1224     Visit Number 25    Date for PT Re-Evaluation 10/02/20    Authorization Type UHC medicare 10 visit medicare guidelines    Progress Note Due on Visit 10    PT Start Time 1218    PT Stop Time 1305    PT Time Calculation (min) 47 min    Activity Tolerance Patient tolerated treatment well    Behavior During Therapy [facility] for tasks assessed/performed             Past Medical History:  Diagnosis Date   Brachial neuritis or radiculitis NOS    Chronic hepatitis C ([place]) 10/05/2015   Depressive disorder, not elsewhere classified    Essential and other specified forms of tremor    Fracture of 5th metatarsal 08/13/2017   Fracture of multiple pubic rami ([place]) 08/13/2017   Headache(784.0)    Hepatitis C infection 12/05/2017   Treated and now resolved and "cured " as of early 2018  And      Memory loss    Microscopic hematuria    seed dr Gaynelle Arabian in past   Migraine, unspecified, without mention of intractable migraine without mention of status migrainosus    Multiple rib fractures 08/13/2017   Other specified disorders of rotator cuff syndrome of shoulder and allied disorders    Vulvar pain    rx with valium supp    Past Surgical History:  Procedure Laterality Date   BREAST EXCISIONAL BIOPSY Right    No scar   CESAREAN SECTION     x3   SHOULDER ARTHROSCOPY W/ ROTATOR CUFF REPAIR Right 2004   SHOULDER ARTHROSCOPY W/ ROTATOR CUFF REPAIR Left 2008    There were no vitals filed for this visit.   Subjective Assessment - 09/02/20 1223     Subjective thinks she overdid chin tucks last visit, more occipital pain  yesterday. Trimmed hedges this morning.    Currently in Pain? Yes    Pain Score 6     Pain Location Head                               [facility] Adult PT Treatment/Exercise - 09/02/20 0001       Neck Exercises: Machines for Strengthening   Nustep L5 x 6 min    Cybex Row 20# 2 x 10   cues for scap retraction   Lat Pull 25# 2 x 10   cues for shoulder depression   Power Tower 10# each shoulder extension , cue core engagement. 2 x 10      Lumbar Exercises: Supine   Other Supine Lumbar Exercises feet on ball bridges while holding 2 # in each hand 2 x 10      Knee/Hip Exercises: Standing   Walking with Sports Cord 30# x4 all directions- needed PT assist for first rep of each d/t instability.      Knee/Hip Exercises: Seated   Sit to Sand 15 reps   , on airex with OHP 3# wate bar     Moist Heat Therapy   Number Minutes Moist Heat 10 Minutes  Moist Heat Location --   occiput - 5 min during supine exercises, 5 end of session     Cryotherapy   Number Minutes Cryotherapy 10 Minutes   5 min during supine exercises, 5 end of session   Type of Cryotherapy Ice pack   [id number]                     PT Short Term Goals - 05/29/20 1191       PT SHORT TERM GOAL #1   Title independent with an initial HEP    Status Achieved               PT Long Term Goals - 08/31/20 1641       PT LONG TERM GOAL #1   Title Patient will report pain decrease to 6 or better out of 10 with functional activities to improve overall function    Status Partially Met   pain can still get high but able to do more functionally     PT LONG TERM GOAL #2   Title increase right LE strength to 4/5    Status On-going      PT LONG TERM GOAL #3   Title increase lumbar ROM 25%    Status Partially Met      PT LONG TERM GOAL #4   Title walk with a minimal deviation    Status On-going   limited by balance, heel pain and knee pain                  Plan - 09/02/20 1301      Clinical Impression Statement Pt tolerated todays interventions well. given reports of some increased occiput pain after chin tucks last session these were defferred and we focused more on other strength, balance and mobiltiy activities as tolerated. Good control with airex STS today. Also able to tolerate increased resistance with resisted gait, 1 instance LOB but able to corrrect with cues for pacing. with upper body machines she did require cues for scap stab (retraction and/or depression) throughout. Reassess tolerance to today next visit and progress as tolerated.    PT Frequency 2x / week    PT Duration 4 weeks    PT Next Visit Plan assess and progress, add exercises as tolerated next visit- more cervical stabilization exercises for DNF?  balance training, continue to add strength activities for a higher function and better quality of life.    Consulted and Agree with Plan of Care Patient             Patient will benefit from skilled therapeutic intervention in order to improve the following deficits and impairments:  Decreased activity tolerance, Decreased endurance, Decreased range of motion, Decreased strength, Pain, Impaired flexibility, Impaired tone, Postural dysfunction, Impaired sensation, Decreased balance, Improper body mechanics, Increased muscle spasms, Abnormal gait  Visit Diagnosis: Chronic bilateral low back pain without sciatica  Pain in right hip  Acute pain of right knee  Difficulty in walking, not elsewhere classified  Muscle spasm of back  Cervicalgia  Radiculopathy, cervical region  Cervicogenic headache     Problem List Patient Active Problem List   Diagnosis Date Noted   Common migraine with intractable migraine 12/10/2019   Chronic pain 08/13/2017   Left groin pain 08/05/2017   Occipital neuralgia of right side 10/21/2016   Chronic migraine 01/26/2015   Cervicogenic headache 12/16/2014   Subclinical hypothyroidism 12/16/2014   Memory loss  10/08/2014   Depression 10/08/2014  Generalized anxiety disorder 10/08/2014   Tremor 10/08/2014   Major depressive disorder, single episode, moderate ([place]) 08/27/2014   Memory difficulties 08/27/2014   Degenerative disc disease, cervical 08/27/2014   Chronically on opiate therapy 08/27/2014    Hall Busing, PT, DPT 09/02/2020, 1:19 PM  [place]. [place], [place], [address] Phone: [phone]   Fax:  [phone]  Name: Toni [name] MRN: [id number] Date of Birth: March 10, 1955

## 2020-09-03 ENCOUNTER — Encounter: Payer: Self-pay | Admitting: Internal Medicine

## 2020-09-03 ENCOUNTER — Telehealth (INDEPENDENT_AMBULATORY_CARE_PROVIDER_SITE_OTHER): Payer: Medicare Other | Admitting: Internal Medicine

## 2020-09-03 ENCOUNTER — Telehealth: Payer: Self-pay | Admitting: Internal Medicine

## 2020-09-03 VITALS — Ht 61.5 in | Wt 195.2 lb

## 2020-09-03 DIAGNOSIS — M5030 Other cervical disc degeneration, unspecified cervical region: Secondary | ICD-10-CM

## 2020-09-03 DIAGNOSIS — M5481 Occipital neuralgia: Secondary | ICD-10-CM

## 2020-09-03 DIAGNOSIS — M1990 Unspecified osteoarthritis, unspecified site: Secondary | ICD-10-CM

## 2020-09-03 DIAGNOSIS — R899 Unspecified abnormal finding in specimens from other organs, systems and tissues: Secondary | ICD-10-CM

## 2020-09-03 DIAGNOSIS — M79672 Pain in left foot: Secondary | ICD-10-CM

## 2020-09-03 DIAGNOSIS — E038 Other specified hypothyroidism: Secondary | ICD-10-CM

## 2020-09-03 DIAGNOSIS — M79671 Pain in right foot: Secondary | ICD-10-CM | POA: Diagnosis not present

## 2020-09-03 NOTE — Progress Notes (Signed)
Virtual Visit via Video Note  I connected withNAME@ on 09/03/20 at 10:00 AM EDT by a video enabled telemedicine application and verified that I am speaking with the correct person using two identifiers. Location patient: home Location provider:home office Persons participating in the virtual visit: patient, provider  [place] national recommendations  regarding COVID 19 pandemic   video visit is advised over in office visit for this patient.  Patient aware  of the limitations of evaluation and management by telemedicine and  availability of in person appointments. and agreed to proceed.   HPI: Toni [name] presents for video visit presenting with musculoskeletal pain arthritis symptoms. She has seen Dr. Venetia Maxon neurosurgeon and underwent vigorous physical therapy which has been quite helpful for her neck and back.  Is pleased with this.  However since she is becoming more active trying to bowl with family members and also jammed her thumb July 4 has had increasing problems walking with heel pain her hands has more bumps on it and distortion.  No fevers.  Remembers having had 2 times a marker on some blood work that showed autoimmune propensity.( Cant find in our record easily )   She feels she is doing pretty well taking her thyroid medicine memory feels good at this time socially some positives happening in her life.  ROS: See pertinent positives and negatives per HPI.  Past Medical History:  Diagnosis Date   Brachial neuritis or radiculitis NOS    Chronic hepatitis C (HCC) 10/05/2015   treated and rsolved   Depressive disorder, not elsewhere classified    Essential and other specified forms of tremor    Fracture of 5th metatarsal 08/13/2017   Fracture of multiple pubic rami (HCC) 08/13/2017   Headache(784.0)    Hepatitis C infection 12/05/2017   Treated and now resolved and "cured " as of early 05/02/16  And      Memory loss    better see evaluation   Microscopic hematuria     seed dr Patsi Sears in past   Migraine, unspecified, without mention of intractable migraine without mention of status migrainosus    Multiple rib fractures 08/13/2017   Other specified disorders of rotator cuff syndrome of shoulder and allied disorders    Vulvar pain    rx with valium supp    Past Surgical History:  Procedure Laterality Date   BREAST EXCISIONAL BIOPSY Right    No scar   CESAREAN SECTION     x3   SHOULDER ARTHROSCOPY W/ ROTATOR CUFF REPAIR Right 03-May-2002   SHOULDER ARTHROSCOPY W/ ROTATOR CUFF REPAIR Left 2006/05/03    Family History  Problem Relation Age of Onset   Headache Mother    Stroke Father        father passed away june17   Diabetes Maternal Grandmother    Other Son        Mother is deceased 05/03/2011 father moved to the area 02-May-2012.    Social History   Tobacco Use   Smoking status: Former    Types: Cigarettes    Quit date: 05/23/1978    Years since quitting: 42.3   Smokeless tobacco: Never  Vaping Use   Vaping Use: Never used  Substance Use Topics   Alcohol use: No    Alcohol/week: 0.0 standard drinks   Drug use: No      Current Outpatient Medications:    albuterol (PROVENTIL HFA;VENTOLIN HFA) 108 (90 Base) MCG/ACT inhaler, Inhale 2 puffs into the lungs every 6 (six) hours as  needed for wheezing or shortness of breath., Disp: 1 Inhaler, Rfl: 0   ALPRAZolam (XANAX) 1 MG tablet, Take 1 tablet (1 mg total) by mouth 4 (four) times daily as needed for anxiety (shakes)., Disp: 5 tablet, Rfl: 0   Alum Hydroxide-Mag Trisilicate 80-14.2 MG CHEW, Chew by mouth. CHEW 2 TABLETS BY MOUTH BEFORE EACH MEAL AT BEDTIME AS NEEDED FOR INDIGESTION OR HEARTBURN, Disp: , Rfl:    amLODipine (NORVASC) 2.5 MG tablet, TAKE ONE TABLET BY MOUTH DAILY, Disp: 90 tablet, Rfl: 1   cetirizine (ZYRTEC) 10 MG tablet, Take 10 mg by mouth daily as needed for allergies., Disp: , Rfl:    desvenlafaxine (PRISTIQ) 50 MG 24 hr tablet, Take 50 mg by mouth daily., Disp: , Rfl:    dextroamphetamine  (DEXEDRINE SPANSULE) 15 MG 24 hr capsule, Take 30-45 mg by mouth daily. 2 po q am and 1 po q noon, Disp: , Rfl:    FLUoxetine (PROZAC) 20 MG capsule, Take 1 capsule by mouth daily., Disp: , Rfl: 0   gabapentin (NEURONTIN) 600 MG tablet, Take 600 mg by mouth. PO TID FOR HEAD PAIN, Disp: , Rfl:    HYDROcodone-ibuprofen (VICOPROFEN) 7.5-200 MG tablet, every 6 (six) hours as needed. , Disp: , Rfl:    ketoconazole (NIZORAL) 2 % cream, APPLY TO AFFECTED AREA TWICE A DAY, Disp: 30 g, Rfl: 2   levothyroxine (SYNTHROID) 25 MCG tablet, TAKE 1 TABLET (25 MCG TOTAL) BY MOUTH DAILY BEFORE BREAKFAST. TAKE ON AN EMPTY STOMACH, Disp: 90 tablet, Rfl: 1   Misc Natural Products (ESTROVEN + ENERGY MAX STRENGTH) TABS, Take by mouth daily., Disp: , Rfl:    Multiple Vitamins-Minerals (CENTRUM SILVER ADULT 50+) TABS, Take 1 tablet by mouth daily., Disp: , Rfl:    SUMAtriptan (IMITREX) 25 MG tablet, Take 1 tablet (25 mg total) by mouth as needed for migraine. May repeat in 2 hours if headache persists or recurs., Disp: 10 tablet, Rfl: 11   SUMAtriptan 6 MG/0.5ML SOAJ, Use 1 syringe for migraines upon awakening.  Do not use with Imitrex tablets., Disp: 0.5 mL, Rfl: 6  EXAM: BP Readings from Last 3 Encounters:  07/23/20 (!) 152/92  04/13/20 122/70  12/10/19 (!) 142/83    VITALS per patient if applicable:  GENERAL: alert, oriented, appears well and in no acute distress  HEENT: atraumatic, conjunttiva clear, no obvious abnormalities on inspection of external nose and ears  NECK: improved movements of the head and neck  LUNGS: on inspection no signs of respiratory distress, breathing rate appears normal, no obvious gross SOB, gasping or wheezing  CV: no obvious cyanosis  MS: moves all visible extremities hands  with  distal  dip  deformity esp little fingers  ? Pip  mcp seem ok    PSYCH/NEURO: pleasant and cooperative, peech and thought processing grossly intact   ASSESSMENT AND PLAN:  Discussed the following  assessment and plan:    ICD-10-CM   1. Deforming arthritis  M19.90 Ambulatory referral to Rheumatology    2. Degenerative disc disease, cervical  M50.30 Ambulatory referral to Rheumatology   improved sx under PT care.    3. Abnormal laboratory test  R89.9 Ambulatory referral to Rheumatology   reports pos autoimmune marker ( dont seein record ) can present to rheum for eval.     4. Heel pain, bilateral  M79.671 Ambulatory referral to Rheumatology   [id number]    doesnt think  it is PF.    5. Occipital neuralgia of right side  M54.81     6. Subclinical hypothyroidism  E03.8       Counseled.   Expectant management and discussion of plan and treatment with opportunity to ask questions and all were answered. The patient agreed with the plan and demonstrated an understanding of the instructions.   Advised to call back or seek an in-person evaluation if worsening  or having  further concerns . No follow-ups on file.    Berniece Andreas, MD

## 2020-09-03 NOTE — Telephone Encounter (Signed)
PT called to advise that she found the paperwork that she had talked to Dr.Panosh about during the visit today and would like to wait before proceeding further to discuss this with Dr.Panosh.

## 2020-09-05 NOTE — Telephone Encounter (Signed)
Ok  Toni [name]  put a hold on the rheumatology referral and have her send a copy  of the lab tests.  So I can see it

## 2020-09-08 ENCOUNTER — Ambulatory Visit: Payer: Medicare Other

## 2020-09-08 ENCOUNTER — Telehealth: Payer: Self-pay | Admitting: Internal Medicine

## 2020-09-08 NOTE — Telephone Encounter (Signed)
Pt returned your call and want a call back. 

## 2020-09-08 NOTE — Telephone Encounter (Signed)
Left a message for the pt to return my call.  

## 2020-09-08 NOTE — Telephone Encounter (Signed)
I spoke with the pt and she was scheduled for OV tomorrow and she stated she would bring lab results with her during visit.

## 2020-09-08 NOTE — Telephone Encounter (Signed)
I spoke with the pt and informed her that she has appt with PCP on 09/06 and can follow up with regarding ongoing issues.

## 2020-09-09 ENCOUNTER — Telehealth (INDEPENDENT_AMBULATORY_CARE_PROVIDER_SITE_OTHER): Payer: Medicare Other | Admitting: Internal Medicine

## 2020-09-09 ENCOUNTER — Encounter: Payer: Self-pay | Admitting: Internal Medicine

## 2020-09-09 ENCOUNTER — Other Ambulatory Visit: Payer: Self-pay

## 2020-09-09 VITALS — Ht 61.5 in | Wt 195.2 lb

## 2020-09-09 DIAGNOSIS — R899 Unspecified abnormal finding in specimens from other organs, systems and tissues: Secondary | ICD-10-CM | POA: Diagnosis not present

## 2020-09-09 DIAGNOSIS — M1990 Unspecified osteoarthritis, unspecified site: Secondary | ICD-10-CM | POA: Diagnosis not present

## 2020-09-09 DIAGNOSIS — E038 Other specified hypothyroidism: Secondary | ICD-10-CM | POA: Diagnosis not present

## 2020-09-09 NOTE — Progress Notes (Signed)
Virtual Visit via Video Note  I connected with Toni [name] on 09/20/20 at 10:30 AM EDT by a video enabled telemedicine application and verified that I am speaking with the correct person using two identifiers. Location patient: home Location provider:work  office Persons participating in the virtual visit: patient, provider  [place] national recommendations  regarding COVID 19 pandemic   video visit is advised over in office visit for this patient.  Patient aware  of the limitations of evaluation and management by telemedicine and  availability of in person appointments. and agreed to proceed.   HPI: Toni [name] presents for video visit  she thinks can delay  put on hold rheum consult since she found information about  abnormal labs in past. Had  ana positive at  1:160. Then neg after that .  No other parameters . Could not come in  today because of  social issues at home Now taking care of thyroid medication   Neck a lot better with pt  ROS: See pertinent positives and negatives per HPI.  Past Medical History:  Diagnosis Date   Brachial neuritis or radiculitis NOS    Chronic hepatitis C (HCC) 10/05/2015   treated and rsolved   Depressive disorder, not elsewhere classified    Essential and other specified forms of tremor    Fracture of 5th metatarsal 08/13/2017   Fracture of multiple pubic rami (HCC) 08/13/2017   Headache(784.0)    Hepatitis C infection 12/05/2017   Treated and now resolved and "cured " as of early 01-May-2016  And      Memory loss    better see evaluation   Microscopic hematuria    seed dr Patsi Sears in past   Migraine, unspecified, without mention of intractable migraine without mention of status migrainosus    Multiple rib fractures 08/13/2017   Other specified disorders of rotator cuff syndrome of shoulder and allied disorders    Vulvar pain    rx with valium supp    Past Surgical History:  Procedure Laterality Date   BREAST  EXCISIONAL BIOPSY Right    No scar   CESAREAN SECTION     x3   SHOULDER ARTHROSCOPY W/ ROTATOR CUFF REPAIR Right 2002/05/02   SHOULDER ARTHROSCOPY W/ ROTATOR CUFF REPAIR Left 2006-05-02    Family History  Problem Relation Age of Onset   Headache Mother    Stroke Father        father passed away june17   Diabetes Maternal Grandmother    Other Son        Mother is deceased May 02, 2011 father moved to the area May 01, 2012.    Social History   Tobacco Use   Smoking status: Former    Types: Cigarettes    Quit date: 05/23/1978    Years since quitting: 42.3   Smokeless tobacco: Never  Vaping Use   Vaping Use: Never used  Substance Use Topics   Alcohol use: No    Alcohol/week: 0.0 standard drinks   Drug use: No      Current Outpatient Medications:    albuterol (PROVENTIL HFA;VENTOLIN HFA) 108 (90 Base) MCG/ACT inhaler, Inhale 2 puffs into the lungs every 6 (six) hours as needed for wheezing or shortness of breath., Disp: 1 Inhaler, Rfl: 0   ALPRAZolam (XANAX) 1 MG tablet, Take 1 tablet (1 mg total) by mouth 4 (four) times daily as needed for anxiety (shakes)., Disp: 5 tablet, Rfl: 0   Alum Hydroxide-Mag Trisilicate 80-14.2 MG CHEW, Chew by mouth. CHEW  2 TABLETS BY MOUTH BEFORE EACH MEAL AT BEDTIME AS NEEDED FOR INDIGESTION OR HEARTBURN, Disp: , Rfl:    amLODipine (NORVASC) 2.5 MG tablet, TAKE ONE TABLET BY MOUTH DAILY, Disp: 90 tablet, Rfl: 1   cetirizine (ZYRTEC) 10 MG tablet, Take 10 mg by mouth daily as needed for allergies., Disp: , Rfl:    desvenlafaxine (PRISTIQ) 50 MG 24 hr tablet, Take 50 mg by mouth daily., Disp: , Rfl:    dextroamphetamine (DEXEDRINE SPANSULE) 15 MG 24 hr capsule, Take 30-45 mg by mouth daily. 2 po q am and 1 po q noon, Disp: , Rfl:    FLUoxetine (PROZAC) 20 MG capsule, Take 1 capsule by mouth daily., Disp: , Rfl: 0   gabapentin (NEURONTIN) 600 MG tablet, Take 600 mg by mouth. PO TID FOR HEAD PAIN, Disp: , Rfl:    HYDROcodone-ibuprofen (VICOPROFEN) 7.5-200 MG tablet, every 6  (six) hours as needed. , Disp: , Rfl:    ketoconazole (NIZORAL) 2 % cream, APPLY TO AFFECTED AREA TWICE A DAY, Disp: 30 g, Rfl: 2   levothyroxine (SYNTHROID) 25 MCG tablet, TAKE 1 TABLET (25 MCG TOTAL) BY MOUTH DAILY BEFORE BREAKFAST. TAKE ON AN EMPTY STOMACH, Disp: 90 tablet, Rfl: 1   Misc Natural Products (ESTROVEN + ENERGY MAX STRENGTH) TABS, Take by mouth daily., Disp: , Rfl:    Multiple Vitamins-Minerals (CENTRUM SILVER ADULT 50+) TABS, Take 1 tablet by mouth daily., Disp: , Rfl:    SUMAtriptan (IMITREX) 25 MG tablet, Take 1 tablet (25 mg total) by mouth as needed for migraine. May repeat in 2 hours if headache persists or recurs., Disp: 10 tablet, Rfl: 11   SUMAtriptan 6 MG/0.5ML SOAJ, Use 1 syringe for migraines upon awakening.  Do not use with Imitrex tablets., Disp: 0.5 mL, Rfl: 6  EXAM: BP Readings from Last 3 Encounters:  07/23/20 (!) 152/92  04/13/20 122/70  12/10/19 (!) 142/83    VITALS per patient if applicable:  GENERAL: alert, oriented, appears well and in no acute distress modestly upset but  speech and  communication normal  no acute findings   PSYCH/NEURO: stressed , speech and thought processing grossly intact cognition is normal  Lab Results  Component Value Date   WBC 4.7 04/07/2020   HGB 12.9 04/07/2020   HCT 38.6 04/07/2020   PLT 217.0 04/07/2020   GLUCOSE 92 04/07/2020   CHOL 212 (H) 04/07/2020   TRIG 115.0 04/07/2020   HDL 49.10 04/07/2020   LDLCALC 140 (H) 04/07/2020   ALT 53 (H) 04/07/2020   AST 28 04/07/2020   NA 143 04/07/2020   K 4.4 04/07/2020   CL 105 04/07/2020   CREATININE 0.75 04/07/2020   BUN 30 (H) 04/07/2020   CO2 27 04/07/2020   TSH 5.37 (H) 04/07/2020   HGBA1C 5.8 02/28/2017   Will eventually get  copy of  ASSESSMENT AND PLAN:  Discussed the following assessment and plan:    ICD-10-CM   1. Deforming arthritis  M19.90    hx of pos ana in past  negative recently    2. Abnormal laboratory test  R89.9     3. Subclinical  hypothyroidism  E03.8      Agree to delay hold rheum appt   at this time  .  Thyroid  following  updated  Social situation as cause of stress.  Counseled.   Expectant management and discussion of plan and treatment with opportunity to ask questions and all were answered. The patient agreed with the plan and demonstrated  an understanding of the instructions.  20 minutes  time visit and plan  Advised to call back or seek an in-person evaluation if worsening  or having  further concerns . Return for when planned.   Berniece Andreas, MD

## 2020-09-10 ENCOUNTER — Other Ambulatory Visit: Payer: Self-pay

## 2020-09-10 ENCOUNTER — Ambulatory Visit: Payer: Medicare Other | Attending: Neurosurgery

## 2020-09-10 DIAGNOSIS — M25551 Pain in right hip: Secondary | ICD-10-CM | POA: Insufficient documentation

## 2020-09-10 DIAGNOSIS — M62830 Muscle spasm of back: Secondary | ICD-10-CM | POA: Insufficient documentation

## 2020-09-10 DIAGNOSIS — R262 Difficulty in walking, not elsewhere classified: Secondary | ICD-10-CM | POA: Insufficient documentation

## 2020-09-10 DIAGNOSIS — M542 Cervicalgia: Secondary | ICD-10-CM | POA: Insufficient documentation

## 2020-09-10 DIAGNOSIS — G4486 Cervicogenic headache: Secondary | ICD-10-CM

## 2020-09-10 DIAGNOSIS — G8929 Other chronic pain: Secondary | ICD-10-CM

## 2020-09-10 DIAGNOSIS — M5412 Radiculopathy, cervical region: Secondary | ICD-10-CM

## 2020-09-10 DIAGNOSIS — M25561 Pain in right knee: Secondary | ICD-10-CM | POA: Insufficient documentation

## 2020-09-10 DIAGNOSIS — M545 Low back pain, unspecified: Secondary | ICD-10-CM | POA: Insufficient documentation

## 2020-09-10 NOTE — Therapy (Signed)
[facility] [address] [place], [place], [address] Phone: [phone]   Fax:  [phone]  Patient Details  Name: Toni [name] MRN: [id number] Date of Birth: 1955/05/25 Referring Provider:  Madelin Headings, MD  Encounter Date: 09/10/2020  Pt arrived to session tearful and with increased stress d/t current home situation and recent occurrences with son and grandson. She expressed that she did not want to cancel appointment today but there was a lot going on at home that she had to address. At this time does mention increased heel pain and finger joint pain that has been more limiting but she does continue to try and do a lot function wise. Treatment deferred for today because pt needed to return home.   Anson Crofts, PT, DPT 09/10/2020, 4:22 PM  [facility] [address] [place], [place], [address] Phone: [phone]   Fax:  [phone]

## 2020-09-14 ENCOUNTER — Ambulatory Visit: Payer: Medicare Other | Admitting: Physical Therapy

## 2020-09-17 ENCOUNTER — Other Ambulatory Visit: Payer: Self-pay

## 2020-09-17 ENCOUNTER — Ambulatory Visit: Payer: Medicare Other | Admitting: Physical Therapy

## 2020-09-17 ENCOUNTER — Encounter: Payer: Self-pay | Admitting: Physical Therapy

## 2020-09-17 DIAGNOSIS — M62830 Muscle spasm of back: Secondary | ICD-10-CM | POA: Diagnosis present

## 2020-09-17 DIAGNOSIS — G4486 Cervicogenic headache: Secondary | ICD-10-CM | POA: Diagnosis present

## 2020-09-17 DIAGNOSIS — M25551 Pain in right hip: Secondary | ICD-10-CM | POA: Diagnosis present

## 2020-09-17 DIAGNOSIS — R262 Difficulty in walking, not elsewhere classified: Secondary | ICD-10-CM

## 2020-09-17 DIAGNOSIS — M5412 Radiculopathy, cervical region: Secondary | ICD-10-CM | POA: Diagnosis present

## 2020-09-17 DIAGNOSIS — M542 Cervicalgia: Secondary | ICD-10-CM | POA: Diagnosis present

## 2020-09-17 DIAGNOSIS — M545 Low back pain, unspecified: Secondary | ICD-10-CM | POA: Diagnosis present

## 2020-09-17 DIAGNOSIS — M25561 Pain in right knee: Secondary | ICD-10-CM | POA: Diagnosis present

## 2020-09-17 DIAGNOSIS — G8929 Other chronic pain: Secondary | ICD-10-CM

## 2020-09-17 NOTE — Therapy (Signed)
[place]. [place], [place], [address] Phone: [phone]   Fax:  [phone]  Physical Therapy Treatment  Patient Details  Name: Toni [name] MRN: [id number] Date of Birth: [birth year] Referring Provider (PT): Vertell Limber   Encounter Date: 09/17/2020   PT End of Session - 09/17/20 1607     Visit Number 26    Date for PT Re-Evaluation 10/02/20    PT Start Time 1530    PT Stop Time 1615    PT Time Calculation (min) 45 min    Activity Tolerance Patient tolerated treatment well    Behavior During Therapy [facility] for tasks assessed/performed             Past Medical History:  Diagnosis Date   Brachial neuritis or radiculitis NOS    Chronic hepatitis C ([place]) 10/05/2015   treated and rsolved   Depressive disorder, not elsewhere classified    Essential and other specified forms of tremor    Fracture of 5th metatarsal 08/13/2017   Fracture of multiple pubic rami ([place]) 08/13/2017   Headache(784.0)    Hepatitis C infection 12/05/2017   Treated and now resolved and "cured " as of early 2018  And      Memory loss    better see evaluation   Microscopic hematuria    seed dr Gaynelle Arabian in past   Migraine, unspecified, without mention of intractable migraine without mention of status migrainosus    Multiple rib fractures 08/13/2017   Other specified disorders of rotator cuff syndrome of shoulder and allied disorders    Vulvar pain    rx with valium supp    Past Surgical History:  Procedure Laterality Date   BREAST EXCISIONAL BIOPSY Right    No scar   CESAREAN SECTION     x3   SHOULDER ARTHROSCOPY W/ ROTATOR CUFF REPAIR Right 2004   SHOULDER ARTHROSCOPY W/ ROTATOR CUFF REPAIR Left 2008    There were no vitals filed for this visit.   Subjective Assessment - 09/17/20 1539     Subjective Patient continues to report a lot a drama at home, reports that she reports that she hopes things are changing for  the better, she verbalizes trying to do more and more and trying to stay up with exercises.  Tearful today    Currently in Pain? Yes    Pain Score 5     Pain Location Neck    Pain Orientation Right    Pain Descriptors / Indicators Spasm;Tightness    Aggravating Factors  cleaning floors                               [facility] Adult PT Treatment/Exercise - 09/17/20 0001       Manual Therapy   Manual Therapy Soft tissue mobilization;Manual Traction    Soft tissue mobilization bilateral upper trap and cervical area. Chin tucks/cervical retraction with suboccipital, paraspinal STM lower cervical spine.    Manual Traction gentle occipital release, supine.                       PT Short Term Goals - 05/29/20 8366       PT SHORT TERM GOAL #1   Title independent with an initial HEP    Status Achieved               PT Long Term Goals - 09/17/20 1609  PT LONG TERM GOAL #1   Title Patient will report pain decrease to 6 or better out of 10 with functional activities to improve overall function    Status Partially Met      PT LONG TERM GOAL #2   Title increase right LE strength to 4/5    Status On-going      PT LONG TERM GOAL #3   Title increase lumbar ROM 25%    Status Partially Met                   Plan - 09/17/20 1608     Clinical Impression Statement Patient has continued to have some increased issues with home setting, has increased anxiety and stress.  Reports that due to issues at home she has had to do a lot of cleaning especially cleaning floors.  She reports that she is hurting all over and most joints today.  She is very tight with knots in the upper trap and the neck area    PT Next Visit Plan progress to exercises and tolerance to activity    Consulted and Agree with Plan of Care Patient             Patient will benefit from skilled therapeutic intervention in order to improve the following deficits and  impairments:  Decreased activity tolerance, Decreased endurance, Decreased range of motion, Decreased strength, Pain, Impaired flexibility, Impaired tone, Postural dysfunction, Impaired sensation, Decreased balance, Improper body mechanics, Increased muscle spasms, Abnormal gait  Visit Diagnosis: Chronic bilateral low back pain without sciatica  Pain in right hip  Acute pain of right knee  Difficulty in walking, not elsewhere classified  Cervicalgia  Radiculopathy, cervical region  Cervicogenic headache  Muscle spasm of back     Problem List Patient Active Problem List   Diagnosis Date Noted   Common migraine with intractable migraine 12/10/2019   Chronic pain 08/13/2017   Left groin pain 08/05/2017   Occipital neuralgia of right side 10/21/2016   Chronic migraine 01/26/2015   Cervicogenic headache 12/16/2014   Subclinical hypothyroidism 12/16/2014   Memory loss 10/08/2014   Depression 10/08/2014   Generalized anxiety disorder 10/08/2014   Tremor 10/08/2014   Major depressive disorder, single episode, moderate ([place]) 08/27/2014   Memory difficulties 08/27/2014   Degenerative disc disease, cervical 08/27/2014   Chronically on opiate therapy 08/27/2014    Sumner Boast, PT 09/17/2020, 4:10 PM  [facility] [place], [place], [address] Phone: [phone]   Fax:  [phone]  Name: Toni [name] MRN: [id number] Date of Birth: 06-08-1955

## 2020-09-21 ENCOUNTER — Ambulatory Visit: Payer: Medicare Other | Admitting: Physical Therapy

## 2020-09-24 ENCOUNTER — Ambulatory Visit: Payer: Medicare Other | Admitting: Physical Therapy

## 2020-09-28 ENCOUNTER — Encounter: Payer: Self-pay | Admitting: Physical Therapy

## 2020-09-28 ENCOUNTER — Ambulatory Visit: Payer: Medicare Other | Admitting: Physical Therapy

## 2020-09-28 ENCOUNTER — Other Ambulatory Visit: Payer: Self-pay

## 2020-09-28 DIAGNOSIS — M25561 Pain in right knee: Secondary | ICD-10-CM

## 2020-09-28 DIAGNOSIS — M25551 Pain in right hip: Secondary | ICD-10-CM

## 2020-09-28 DIAGNOSIS — M62830 Muscle spasm of back: Secondary | ICD-10-CM

## 2020-09-28 DIAGNOSIS — G4486 Cervicogenic headache: Secondary | ICD-10-CM

## 2020-09-28 DIAGNOSIS — G8929 Other chronic pain: Secondary | ICD-10-CM

## 2020-09-28 DIAGNOSIS — M5412 Radiculopathy, cervical region: Secondary | ICD-10-CM

## 2020-09-28 DIAGNOSIS — M542 Cervicalgia: Secondary | ICD-10-CM

## 2020-09-28 DIAGNOSIS — R262 Difficulty in walking, not elsewhere classified: Secondary | ICD-10-CM

## 2020-09-28 DIAGNOSIS — M545 Low back pain, unspecified: Secondary | ICD-10-CM | POA: Diagnosis not present

## 2020-09-28 NOTE — Therapy (Signed)
[place]. [place], [place], [address] Phone: [phone]   Fax:  [phone]  Physical Therapy Treatment  Patient Details  Name: Toni [name] MRN: [id number] Date of Birth: Nov 25, 1955 Referring Provider (PT): Vertell Limber   Encounter Date: 09/28/2020   PT End of Session - 09/28/20 1842     Visit Number 27    Date for PT Re-Evaluation 10/02/20    PT Start Time 1814    PT Stop Time 1900    PT Time Calculation (min) 46 min    Activity Tolerance Patient tolerated treatment well    Behavior During Therapy [facility] for tasks assessed/performed             Past Medical History:  Diagnosis Date   Brachial neuritis or radiculitis NOS    Chronic hepatitis C ([place]) 10/05/2015   treated and rsolved   Depressive disorder, not elsewhere classified    Essential and other specified forms of tremor    Fracture of 5th metatarsal 08/13/2017   Fracture of multiple pubic rami ([place]) 08/13/2017   Headache(784.0)    Hepatitis C infection 12/05/2017   Treated and now resolved and "cured " as of early 2018  And      Memory loss    better see evaluation   Microscopic hematuria    seed dr Gaynelle Arabian in past   Migraine, unspecified, without mention of intractable migraine without mention of status migrainosus    Multiple rib fractures 08/13/2017   Other specified disorders of rotator cuff syndrome of shoulder and allied disorders    Vulvar pain    rx with valium supp    Past Surgical History:  Procedure Laterality Date   BREAST EXCISIONAL BIOPSY Right    No scar   CESAREAN SECTION     x3   SHOULDER ARTHROSCOPY W/ ROTATOR CUFF REPAIR Right 2004   SHOULDER ARTHROSCOPY W/ ROTATOR CUFF REPAIR Left 2008    There were no vitals filed for this visit.   Subjective Assessment - 09/28/20 1812     Subjective Patient reports that she is stiff and sore,"I need to move"    Currently in Pain? Yes    Pain Score 6     Pain  Location Neck    Pain Orientation Right    Aggravating Factors  stress    Pain Relieving Factors the treatment helps                               [facility] Adult PT Treatment/Exercise - 09/28/20 0001       Neck Exercises: Machines for Strengthening   Nustep L5 x 6 min    Cybex Row 20# 2 x 10    Lat Pull 25# 2 x 10    Power Tower 10# each shoulder extension , cue core engagement. 2 x 10      Neck Exercises: Theraband   Shoulder External Rotation 20 reps;Red      Neck Exercises: Standing   Wall Wash flexion and scaption    Other Standing Exercises I, Y T x 10 each      Manual Therapy   Manual Therapy Soft tissue mobilization;Manual Traction    Soft tissue mobilization bilateral upper trap and cervical area. Chin tucks/cervical retraction with suboccipital, paraspinal STM lower cervical spine.    Manual Traction gentle occipital release, supine.  PT Short Term Goals - 05/29/20 2725       PT SHORT TERM GOAL #1   Title independent with an initial HEP    Status Achieved               PT Long Term Goals - 09/28/20 1845       PT LONG TERM GOAL #1   Title Patient will report pain decrease to 6 or better out of 10 with functional activities to improve overall function    Status Partially Met      PT LONG TERM GOAL #2   Title increase right LE strength to 4/5    Status Partially Met      PT LONG TERM GOAL #3   Title increase lumbar ROM 25%    Status Partially Met      PT LONG TERM GOAL #4   Title walk with a minimal deviation    Status Partially Met                   Plan - 09/28/20 1842     Clinical Impression Statement Patient with motivation to exercise, she was able to perform exercises some cues for form and core activation as well as a little overpressure for good and full ROM.  The shoulders and the core are weak, she continues to have significant knots in the right cervical and upper trap area,  very tender    PT Next Visit Plan work on core and functional strength, try to see if we can help with knots and spasms    Consulted and Agree with Plan of Care Patient             Patient will benefit from skilled therapeutic intervention in order to improve the following deficits and impairments:  Decreased activity tolerance, Decreased endurance, Decreased range of motion, Decreased strength, Pain, Impaired flexibility, Impaired tone, Postural dysfunction, Impaired sensation, Decreased balance, Improper body mechanics, Increased muscle spasms, Abnormal gait  Visit Diagnosis: Chronic bilateral low back pain without sciatica  Pain in right hip  Acute pain of right knee  Difficulty in walking, not elsewhere classified  Cervicalgia  Radiculopathy, cervical region  Cervicogenic headache  Muscle spasm of back     Problem List Patient Active Problem List   Diagnosis Date Noted   Common migraine with intractable migraine 12/10/2019   Chronic pain 08/13/2017   Left groin pain 08/05/2017   Occipital neuralgia of right side 10/21/2016   Chronic migraine 01/26/2015   Cervicogenic headache 12/16/2014   Subclinical hypothyroidism 12/16/2014   Memory loss 10/08/2014   Depression 10/08/2014   Generalized anxiety disorder 10/08/2014   Tremor 10/08/2014   Major depressive disorder, single episode, moderate ([place]) 08/27/2014   Memory difficulties 08/27/2014   Degenerative disc disease, cervical 08/27/2014   Chronically on opiate therapy 08/27/2014    Sumner Boast, PT 09/28/2020, 6:46 PM  [place]. [place], [place], [address] Phone: [phone]   Fax:  [phone]  Name: Toni [name] MRN: [id number] Date of Birth: 14-Apr-1955

## 2020-10-01 ENCOUNTER — Ambulatory Visit: Payer: Medicare Other | Admitting: Physical Therapy

## 2020-10-09 ENCOUNTER — Ambulatory Visit: Payer: Medicare Other | Attending: Neurosurgery | Admitting: Physical Therapy

## 2020-10-09 ENCOUNTER — Other Ambulatory Visit: Payer: Self-pay

## 2020-10-09 DIAGNOSIS — G8929 Other chronic pain: Secondary | ICD-10-CM | POA: Diagnosis present

## 2020-10-09 DIAGNOSIS — M62830 Muscle spasm of back: Secondary | ICD-10-CM | POA: Insufficient documentation

## 2020-10-09 DIAGNOSIS — M25551 Pain in right hip: Secondary | ICD-10-CM | POA: Diagnosis present

## 2020-10-09 DIAGNOSIS — M25561 Pain in right knee: Secondary | ICD-10-CM | POA: Diagnosis present

## 2020-10-09 DIAGNOSIS — M545 Low back pain, unspecified: Secondary | ICD-10-CM | POA: Diagnosis not present

## 2020-10-09 DIAGNOSIS — M5412 Radiculopathy, cervical region: Secondary | ICD-10-CM | POA: Diagnosis present

## 2020-10-09 DIAGNOSIS — R262 Difficulty in walking, not elsewhere classified: Secondary | ICD-10-CM | POA: Diagnosis present

## 2020-10-09 DIAGNOSIS — G4486 Cervicogenic headache: Secondary | ICD-10-CM | POA: Insufficient documentation

## 2020-10-09 DIAGNOSIS — M542 Cervicalgia: Secondary | ICD-10-CM | POA: Insufficient documentation

## 2020-10-09 NOTE — Therapy (Signed)
[place]. [place], [place], [address] Phone: [phone]   Fax:  [phone]  Physical Therapy Treatment  Patient Details  Name: Toni [name] MRN: [id number] Date of Birth: 06-29-1955 Referring Provider (PT): Macarthur Critchley Date: 10/09/2020   PT End of Session - 10/09/20 0846     Visit Number 28    Date for PT Re-Evaluation 10/02/20    PT Start Time 0810    PT Stop Time 0845    PT Time Calculation (min) 35 min             Past Medical History:  Diagnosis Date   Brachial neuritis or radiculitis NOS    Chronic hepatitis C ([place]) 10/05/2015   treated and rsolved   Depressive disorder, not elsewhere classified    Essential and other specified forms of tremor    Fracture of 5th metatarsal 08/13/2017   Fracture of multiple pubic rami ([place]) 08/13/2017   Headache(784.0)    Hepatitis C infection 12/05/2017   Treated and now resolved and "cured " as of early 2018  And      Memory loss    better see evaluation   Microscopic hematuria    seed dr Gaynelle Arabian in past   Migraine, unspecified, without mention of intractable migraine without mention of status migrainosus    Multiple rib fractures 08/13/2017   Other specified disorders of rotator cuff syndrome of shoulder and allied disorders    Vulvar pain    rx with valium supp    Past Surgical History:  Procedure Laterality Date   BREAST EXCISIONAL BIOPSY Right    No scar   CESAREAN SECTION     x3   SHOULDER ARTHROSCOPY W/ ROTATOR CUFF REPAIR Right 2004   SHOULDER ARTHROSCOPY W/ ROTATOR CUFF REPAIR Left 2008    There were no vitals filed for this visit.   Subjective Assessment - 10/09/20 0809     Subjective pt arrived 10 min late. " I have to watch left knee hitting floor" pain in knee and neck.pain in hees is making my balance off. Neck 70% better.Back 60%    Currently in Pain? Yes    Pain Score 6     Pain Location Neck                 [facility] PT Assessment - 10/09/20 0001       AROM   Overall AROM Comments cerv ext WNLS,all other directions limited 25%   LUMBAR ROM WNLS                          [facility] Adult PT Treatment/Exercise - 10/09/20 0001       Neck Exercises: Machines for Strengthening   UBE (Upper Arm Bike) L 2 1.5 min fwd/1.5 min back   pt stopped several times " can't do more"   Cybex Row 20# 2 x 10    Lat Pull 25# 2 x 10      Neck Exercises: Theraband   Shoulder Extension 20 reps;Red    Rows 20 reps;Red    Shoulder External Rotation 20 reps;Red    Horizontal ABduction 20 reps;Red      Neck Exercises: Seated   Neck Retraction 10 reps    Other Seated Exercise 3# shld shrugs and backward rolls 10 each      Neck Exercises: Stretches   Neck Stretch 2 reps;10 seconds   lateral flex,flex and  lat flex with slight downward rotation     Lumbar Exercises: Seated   Other Seated Lumbar Exercises core ex- wt bal chest press and rotation. green tband trunk ext 2 sets 10                       PT Short Term Goals - 05/29/20 3716       PT SHORT TERM GOAL #1   Title independent with an initial HEP    Status Achieved               PT Long Term Goals - 10/09/20 0825       PT LONG TERM GOAL #1   Title Patient will report pain decrease to 6 or better out of 10 with functional activities to improve overall function    Baseline pt rates a variety of pain locations 6/10    Status Partially Met      PT LONG TERM GOAL #2   Title increase right LE strength to 4/5    Status Achieved      PT LONG TERM GOAL #3   Title increase lumbar ROM 25%    Baseline cerv ext WNLS. other directions limited 25%. Lumbar WNLS    Status Achieved      PT LONG TERM GOAL #4   Title walk with a minimal deviation    Baseline unsteady    Status Partially Met                   Plan - 10/09/20 0847     Clinical Impression Statement pt arrived 10 min late. needed freq  redirection with ex with moderate cuing. pt focused on pain all over,esp left knee and left heel today so ex done in non weightbearing. pt with very unsteady gait and verb it was due to heel pain. pt stated neck 70% better ,cerv ROM ext WNLS and all other directions limited 25% and back 50% better with ROM WNLS. Pt sttaes PT is what has helped her neck the most in over a decade. pt more aware of posture and self correction. educ pt in self stretching. progressing with goals.    PT Treatment/Interventions ADLs/Self Care Home Management;Moist Heat;Therapeutic activities;Therapeutic exercise;Neuromuscular re-education;Patient/family education;Manual techniques;Passive range of motion;Joint Manipulations;Other (comment);Traction;Dry needling;Electrical Stimulation;Cryotherapy    PT Next Visit Plan will renew pt for add'l month with focus on HEPS and home management             Patient will benefit from skilled therapeutic intervention in order to improve the following deficits and impairments:  Decreased activity tolerance, Decreased endurance, Decreased range of motion, Decreased strength, Pain, Impaired flexibility, Impaired tone, Postural dysfunction, Impaired sensation, Decreased balance, Improper body mechanics, Increased muscle spasms, Abnormal gait  Visit Diagnosis: Chronic bilateral low back pain without sciatica  Difficulty in walking, not elsewhere classified  Cervicalgia     Problem List Patient Active Problem List   Diagnosis Date Noted   Common migraine with intractable migraine 12/10/2019   Chronic pain 08/13/2017   Left groin pain 08/05/2017   Occipital neuralgia of right side 10/21/2016   Chronic migraine 01/26/2015   Cervicogenic headache 12/16/2014   Subclinical hypothyroidism 12/16/2014   Memory loss 10/08/2014   Depression 10/08/2014   Generalized anxiety disorder 10/08/2014   Tremor 10/08/2014   Major depressive disorder, single episode, moderate ([place]) 08/27/2014    Memory difficulties 08/27/2014   Degenerative disc disease, cervical 08/27/2014   Chronically on opiate therapy 08/27/2014  Lonzy Mato,ANGIE, PTA 10/09/2020, 8:52 AM  Juel Burrow, PT, DPT 10/09/2020, 8:52 AM  [place]. [place], [place], [address] Phone: [phone]   Fax:  [phone]  Name: Toni [name] MRN: [id number] Date of Birth: [birth year]

## 2020-10-14 ENCOUNTER — Ambulatory Visit: Payer: Medicare Other | Admitting: Physical Therapy

## 2020-10-14 ENCOUNTER — Other Ambulatory Visit: Payer: Self-pay

## 2020-10-14 DIAGNOSIS — G4486 Cervicogenic headache: Secondary | ICD-10-CM

## 2020-10-14 DIAGNOSIS — M545 Low back pain, unspecified: Secondary | ICD-10-CM

## 2020-10-14 DIAGNOSIS — R262 Difficulty in walking, not elsewhere classified: Secondary | ICD-10-CM

## 2020-10-14 DIAGNOSIS — G8929 Other chronic pain: Secondary | ICD-10-CM

## 2020-10-14 DIAGNOSIS — M5412 Radiculopathy, cervical region: Secondary | ICD-10-CM

## 2020-10-14 DIAGNOSIS — M62830 Muscle spasm of back: Secondary | ICD-10-CM

## 2020-10-14 DIAGNOSIS — M542 Cervicalgia: Secondary | ICD-10-CM

## 2020-10-14 NOTE — Therapy (Signed)
[place]. [place], [place], [address] Phone: [phone]   Fax:  [phone]  Physical Therapy Treatment  Patient Details  Name: Toni [name] MRN: [id number] Date of Birth: Aug 01, 1955 Referring Provider (PT): Macarthur Critchley Date: 10/14/2020   PT End of Session - 10/14/20 3810     Visit Number 29    Date for PT Re-Evaluation 10/02/20    PT Start Time 1620    PT Stop Time 1705    PT Time Calculation (min) 45 min    Activity Tolerance Patient tolerated treatment well    Behavior During Therapy [facility] for tasks assessed/performed             Past Medical History:  Diagnosis Date   Brachial neuritis or radiculitis NOS    Chronic hepatitis C ([place]) 10/05/2015   treated and rsolved   Depressive disorder, not elsewhere classified    Essential and other specified forms of tremor    Fracture of 5th metatarsal 08/13/2017   Fracture of multiple pubic rami ([place]) 08/13/2017   Headache(784.0)    Hepatitis C infection 12/05/2017   Treated and now resolved and "cured " as of early 2018  And      Memory loss    better see evaluation   Microscopic hematuria    seed dr Gaynelle Arabian in past   Migraine, unspecified, without mention of intractable migraine without mention of status migrainosus    Multiple rib fractures 08/13/2017   Other specified disorders of rotator cuff syndrome of shoulder and allied disorders    Vulvar pain    rx with valium supp    Past Surgical History:  Procedure Laterality Date   BREAST EXCISIONAL BIOPSY Right    No scar   CESAREAN SECTION     x3   SHOULDER ARTHROSCOPY W/ ROTATOR CUFF REPAIR Right 2004   SHOULDER ARTHROSCOPY W/ ROTATOR CUFF REPAIR Left 2008    There were no vitals filed for this visit.   Subjective Assessment - 10/14/20 1627     Subjective Pt reports being busy today with her family. Neck and shoulder "better than some days." Pt notes her occipital  neuropathy is acting up. Pt states she had to move some heavy plantars    Pertinent History brachial neuritis, migraines, anxiety, tremors    Limitations Sitting;Lifting;Standing;Walking;House hold activities;Other (comment)    How long can you sit comfortably? 10 min without readjusting    How long can you stand comfortably? 10 min but pain increases    Diagnostic tests MRI in February 2022    Patient Stated Goals have less pain    Currently in Pain? Yes    Pain Score 7     Pain Location Neck                               [facility] Adult PT Treatment/Exercise - 10/14/20 0001       Neck Exercises: Seated   Neck Retraction 10 reps    Cervical Rotation Right;Left;5 reps   5 sec iso holds against towel   Lateral Flexion 5 reps    Other Seated Exercise cervical extension with towel stretch 10x3 sec hold      Neck Exercises: Supine   Capital Flexion 20 reps    Other Supine Exercise Cervical spine in neutral with iso holds against gravity 5x5 sec      Neck Exercises: Stretches  Upper Trapezius Stretch 30 seconds    Levator Stretch 30 seconds      Manual Therapy   Manual Therapy Soft tissue mobilization;Manual Traction;Joint mobilization    Joint Mobilization Lateral cervical mobs on R transverse processes mid to lower cervical spine grade II to III    Soft tissue mobilization bilateral upper trap, levators, suboccipitals, paraspinal STM lower cervical spine.    Manual Traction gentle occipital release, supine.                       PT Short Term Goals - 05/29/20 4742       PT SHORT TERM GOAL #1   Title independent with an initial HEP    Status Achieved               PT Long Term Goals - 10/09/20 0825       PT LONG TERM GOAL #1   Title Patient will report pain decrease to 6 or better out of 10 with functional activities to improve overall function    Baseline pt rates a variety of pain locations 6/10    Status Partially Met      PT  LONG TERM GOAL #2   Title increase right LE strength to 4/5    Status Achieved      PT LONG TERM GOAL #3   Title increase lumbar ROM 25%    Baseline cerv ext WNLS. other directions limited 25%. Lumbar WNLS    Status Achieved      PT LONG TERM GOAL #4   Title walk with a minimal deviation    Baseline unsteady    Status Partially Met                   Plan - 10/14/20 1753     Clinical Impression Statement Pt with increased anxiety this session due to psychosocial factors. Treatment focused on improving neck ROM with towel stretches, manual therapy and neck stabilizing exercises. Pt tolerated treatment well. Mild increase in R UE radiculopathy upon palpation of mid cervical transverse processes; improved with joint mobilization. Worked on improving posterior cervical paraspinal strength and endurance.    Personal Factors and Comorbidities Age;Past/Current Experience;Time since onset of injury/illness/exacerbation;Comorbidity 2    Comorbidities brachial neuritis, migraines, anxiety, tremors    Examination-Activity Limitations Caring for Others;Carry;Lift;Sleep;Stand    Examination-Participation Restrictions Cleaning;Community Activity;Driving;Laundry;Meal Prep;Shop;Yard Work    Stability/Clinical Decision Making Evolving/Moderate complexity    Rehab Potential Fair    PT Frequency Other (comment)   1-2x/ week   PT Duration 4 weeks    PT Treatment/Interventions ADLs/Self Care Home Management;Moist Heat;Therapeutic activities;Therapeutic exercise;Neuromuscular re-education;Patient/family education;Manual techniques;Passive range of motion;Joint Manipulations;Other (comment);Traction;Dry needling;Electrical Stimulation;Cryotherapy    PT Next Visit Plan Focus on HEPS and home management    Consulted and Agree with Plan of Care Patient             Patient will benefit from skilled therapeutic intervention in order to improve the following deficits and impairments:  Decreased  activity tolerance, Decreased endurance, Decreased range of motion, Decreased strength, Pain, Impaired flexibility, Impaired tone, Postural dysfunction, Impaired sensation, Decreased balance, Improper body mechanics, Increased muscle spasms, Abnormal gait  Visit Diagnosis: Chronic bilateral low back pain without sciatica  Difficulty in walking, not elsewhere classified  Cervicalgia  Radiculopathy, cervical region  Cervicogenic headache  Muscle spasm of back     Problem List Patient Active Problem List   Diagnosis Date Noted   Common migraine with  intractable migraine 12/10/2019   Chronic pain 08/13/2017   Left groin pain 08/05/2017   Occipital neuralgia of right side 10/21/2016   Chronic migraine 01/26/2015   Cervicogenic headache 12/16/2014   Subclinical hypothyroidism 12/16/2014   Memory loss 10/08/2014   Depression 10/08/2014   Generalized anxiety disorder 10/08/2014   Tremor 10/08/2014   Major depressive disorder, single episode, moderate ([place]) 08/27/2014   Memory difficulties 08/27/2014   Degenerative disc disease, cervical 08/27/2014   Chronically on opiate therapy 08/27/2014    [facility] April Gordy Levan, PT, DPT 10/14/2020, 5:59 PM  [place]. [place], [place], [address] Phone: [phone]   Fax:  [phone]  Name: Cassidey Barrales MRN: [id number] Date of Birth: 1955-07-10

## 2020-10-16 ENCOUNTER — Other Ambulatory Visit: Payer: Self-pay

## 2020-10-16 ENCOUNTER — Ambulatory Visit: Payer: Medicare Other | Admitting: Physical Therapy

## 2020-10-16 DIAGNOSIS — M5412 Radiculopathy, cervical region: Secondary | ICD-10-CM

## 2020-10-16 DIAGNOSIS — M25561 Pain in right knee: Secondary | ICD-10-CM

## 2020-10-16 DIAGNOSIS — M542 Cervicalgia: Secondary | ICD-10-CM

## 2020-10-16 DIAGNOSIS — R262 Difficulty in walking, not elsewhere classified: Secondary | ICD-10-CM

## 2020-10-16 DIAGNOSIS — G4486 Cervicogenic headache: Secondary | ICD-10-CM

## 2020-10-16 DIAGNOSIS — G8929 Other chronic pain: Secondary | ICD-10-CM

## 2020-10-16 DIAGNOSIS — M62830 Muscle spasm of back: Secondary | ICD-10-CM

## 2020-10-16 DIAGNOSIS — M25551 Pain in right hip: Secondary | ICD-10-CM

## 2020-10-16 DIAGNOSIS — M545 Low back pain, unspecified: Secondary | ICD-10-CM | POA: Diagnosis not present

## 2020-10-16 NOTE — Therapy (Signed)
[place] [place], [place], [address] Phone: [phone]   Fax:  [phone]  Physical Therapy Treatment  Patient Details  Name: Nadeen Shipman MRN: [id number] Date of Birth: 06-May-1955 Referring Provider (PT): Macarthur Critchley Date: 10/16/2020   PT End of Session - 10/16/20 0852     Visit Number 30    Date for PT Re-Evaluation 10/02/20    PT Start Time 0852    PT Stop Time 0930    PT Time Calculation (min) 38 min    Activity Tolerance Patient tolerated treatment well    Behavior During Therapy [facility] for tasks assessed/performed             Past Medical History:  Diagnosis Date   Brachial neuritis or radiculitis NOS    Chronic hepatitis C ([place]) 10/05/2015   treated and rsolved   Depressive disorder, not elsewhere classified    Essential and other specified forms of tremor    Fracture of 5th metatarsal 08/13/2017   Fracture of multiple pubic rami ([place]) 08/13/2017   Headache(784.0)    Hepatitis C infection 12/05/2017   Treated and now resolved and "cured " as of early 2018  And      Memory loss    better see evaluation   Microscopic hematuria    seed dr Gaynelle Arabian in past   Migraine, unspecified, without mention of intractable migraine without mention of status migrainosus    Multiple rib fractures 08/13/2017   Other specified disorders of rotator cuff syndrome of shoulder and allied disorders    Vulvar pain    rx with valium supp    Past Surgical History:  Procedure Laterality Date   BREAST EXCISIONAL BIOPSY Right    No scar   CESAREAN SECTION     x3   SHOULDER ARTHROSCOPY W/ ROTATOR CUFF REPAIR Right 2004   SHOULDER ARTHROSCOPY W/ ROTATOR CUFF REPAIR Left 2008    There were no vitals filed for this visit.   Subjective Assessment - 10/16/20 0926     Subjective Pt c/o primarily hip pain this session. Pt states that she felt her neck was worked out after last session. Pt  notes that her occipital nerve is not bothering her as much now.    Pertinent History brachial neuritis, migraines, anxiety, tremors    Limitations Sitting;Lifting;Standing;Walking;House hold activities;Other (comment)    How long can you sit comfortably? 10 min without readjusting    How long can you stand comfortably? 10 min but pain increases    Diagnostic tests MRI in February 2022    Patient Stated Goals have less pain    Currently in Pain? Yes    Pain Score 8     Pain Location Hip    Pain Orientation Right    Pain Descriptors / Indicators Spasm                               [facility] Adult PT Treatment/Exercise - 10/16/20 0001       Lumbar Exercises: Stretches   Single Knee to Chest Stretch 2 reps;20 seconds    ITB Stretch 2 reps;20 seconds      Lumbar Exercises: Sidelying   Clam 20 reps    Other Sidelying Lumbar Exercises hip tap forward & back 2x10      Knee/Hip Exercises: Sidelying   Other Sidelying Knee/Hip Exercises Hamstring curl 2x10  Moist Heat Therapy   Number Minutes Moist Heat 10 Minutes    Moist Heat Location Hip      Manual Therapy   Soft tissue mobilization Glute med & max STW & TPR                     PT Education - 10/16/20 1028     Education Details Discussed taking more time during her day to perform the stretches in HEP, use heat and relax. Pt reports interest in doing dance as exercise.    Person(s) Educated Patient    Methods Explanation    Comprehension Verbalized understanding              PT Short Term Goals - 05/29/20 0923       PT SHORT TERM GOAL #1   Title independent with an initial HEP    Status Achieved               PT Long Term Goals - 10/09/20 0825       PT LONG TERM GOAL #1   Title Patient will report pain decrease to 6 or better out of 10 with functional activities to improve overall function    Baseline pt rates a variety of pain locations 6/10    Status Partially Met       PT LONG TERM GOAL #2   Title increase right LE strength to 4/5    Status Achieved      PT LONG TERM GOAL #3   Title increase lumbar ROM 25%    Baseline cerv ext WNLS. other directions limited 25%. Lumbar WNLS    Status Achieved      PT LONG TERM GOAL #4   Title walk with a minimal deviation    Baseline unsteady    Status Partially Met                   Plan - 10/16/20 0929     Clinical Impression Statement Treatment session focused on improving hip strength and mobility for improved ambulation. Moist heat applied for pain management and improved muscle extensibility with her hip stretches and prior to manual therapy. Increased time spent working on large glute trigger point. Pt tolerated treatment well and feels she walked out of therapy better. Educated pt on using a ball to work on her glute trigger point at home.    Personal Factors and Comorbidities Age;Past/Current Experience;Time since onset of injury/illness/exacerbation;Comorbidity 2    Comorbidities brachial neuritis, migraines, anxiety, tremors    Examination-Activity Limitations Caring for Others;Carry;Lift;Sleep;Stand    Examination-Participation Restrictions Cleaning;Community Activity;Driving;Laundry;Meal Prep;Shop;Yard Work    Stability/Clinical Decision Making Evolving/Moderate complexity    Rehab Potential Fair    PT Frequency Other (comment)   1-2x/ week   PT Duration 4 weeks    PT Treatment/Interventions ADLs/Self Care Home Management;Moist Heat;Therapeutic activities;Therapeutic exercise;Neuromuscular re-education;Patient/family education;Manual techniques;Passive range of motion;Joint Manipulations;Other (comment);Traction;Dry needling;Electrical Stimulation;Cryotherapy    PT Next Visit Plan Focus on HEPS and home management; continue hip abductor/glute strengthening, core and posterior cervical/shoulder strengthening    PT Home Exercise Plan Ball on wall to work on [employer] trigger point    Consulted and  Agree with Plan of Care Patient             Patient will benefit from skilled therapeutic intervention in order to improve the following deficits and impairments:  Decreased activity tolerance, Decreased endurance, Decreased range of motion, Decreased strength, Pain, Impaired flexibility, Impaired tone, Postural  dysfunction, Impaired sensation, Decreased balance, Improper body mechanics, Increased muscle spasms, Abnormal gait  Visit Diagnosis: Chronic bilateral low back pain without sciatica  Difficulty in walking, not elsewhere classified  Cervicalgia  Radiculopathy, cervical region  Cervicogenic headache  Muscle spasm of back  Pain in right hip  Acute pain of right knee     Problem List Patient Active Problem List   Diagnosis Date Noted   Common migraine with intractable migraine 12/10/2019   Chronic pain 08/13/2017   Left groin pain 08/05/2017   Occipital neuralgia of right side 10/21/2016   Chronic migraine 01/26/2015   Cervicogenic headache 12/16/2014   Subclinical hypothyroidism 12/16/2014   Memory loss 10/08/2014   Depression 10/08/2014   Generalized anxiety disorder 10/08/2014   Tremor 10/08/2014   Major depressive disorder, single episode, moderate ([place]) 08/27/2014   Memory difficulties 08/27/2014   Degenerative disc disease, cervical 08/27/2014   Chronically on opiate therapy 08/27/2014    [facility] April Gordy Levan, PT, DPT 10/16/2020, 10:37 AM  [place]. [place], [place], [address] Phone: [phone]   Fax:  [phone]  Name: Cathren Sween MRN: [id number] Date of Birth: 1955/10/25

## 2020-10-19 ENCOUNTER — Other Ambulatory Visit: Payer: Self-pay

## 2020-10-19 ENCOUNTER — Encounter: Payer: Self-pay | Admitting: Physical Therapy

## 2020-10-19 ENCOUNTER — Ambulatory Visit: Payer: Medicare Other | Admitting: Physical Therapy

## 2020-10-19 DIAGNOSIS — M545 Low back pain, unspecified: Secondary | ICD-10-CM

## 2020-10-19 DIAGNOSIS — M62830 Muscle spasm of back: Secondary | ICD-10-CM

## 2020-10-19 DIAGNOSIS — M25551 Pain in right hip: Secondary | ICD-10-CM

## 2020-10-19 DIAGNOSIS — G4486 Cervicogenic headache: Secondary | ICD-10-CM

## 2020-10-19 DIAGNOSIS — M5412 Radiculopathy, cervical region: Secondary | ICD-10-CM

## 2020-10-19 DIAGNOSIS — M542 Cervicalgia: Secondary | ICD-10-CM

## 2020-10-19 DIAGNOSIS — G8929 Other chronic pain: Secondary | ICD-10-CM

## 2020-10-19 DIAGNOSIS — R262 Difficulty in walking, not elsewhere classified: Secondary | ICD-10-CM

## 2020-10-19 NOTE — Therapy (Signed)
[place]. [place], [place], [address] Phone: [phone]   Fax:  [phone]  Physical Therapy Treatment  Patient Details  Name: Toni [name] MRN: [id number] Date of Birth: 21-Oct-1955 Referring Provider (PT): Macarthur Critchley Date: 10/19/2020   PT End of Session - 10/19/20 1800     Visit Number 31    Date for PT Re-Evaluation 11/02/20    Authorization Type [employer] medicare 10 visit medicare guidelines    PT Start Time 1716    PT Stop Time 1800    PT Time Calculation (min) 44 min    Activity Tolerance Patient tolerated treatment well;Patient limited by pain    Behavior During Therapy [facility] for tasks assessed/performed             Past Medical History:  Diagnosis Date   Brachial neuritis or radiculitis NOS    Chronic hepatitis C ([place]) 10/05/2015   treated and rsolved   Depressive disorder, not elsewhere classified    Essential and other specified forms of tremor    Fracture of 5th metatarsal 08/13/2017   Fracture of multiple pubic rami ([place]) 08/13/2017   Headache(784.0)    Hepatitis C infection 12/05/2017   Treated and now resolved and "cured " as of early 2018  And      Memory loss    better see evaluation   Microscopic hematuria    seed dr Gaynelle Arabian in past   Migraine, unspecified, without mention of intractable migraine without mention of status migrainosus    Multiple rib fractures 08/13/2017   Other specified disorders of rotator cuff syndrome of shoulder and allied disorders    Vulvar pain    rx with valium supp    Past Surgical History:  Procedure Laterality Date   BREAST EXCISIONAL BIOPSY Right    No scar   CESAREAN SECTION     x3   SHOULDER ARTHROSCOPY W/ ROTATOR CUFF REPAIR Right 2004   SHOULDER ARTHROSCOPY W/ ROTATOR CUFF REPAIR Left 2008    There were no vitals filed for this visit.   Subjective Assessment - 10/19/20 1758     Subjective Patient reports increased  life stressors over the weekend, reports that she has back and neck pain, reports high rating of pain    Currently in Pain? Yes    Pain Score 9     Pain Location Back   neck   Pain Descriptors / Indicators Spasm;Aching;Sharp;Tightness    Aggravating Factors  stress                               [facility] Adult PT Treatment/Exercise - 10/19/20 0001       Manual Therapy   Manual Therapy Soft tissue mobilization;Manual Traction;Joint mobilization    Joint Mobilization Lateral cervical mobs on R transverse processes mid to lower cervical spine grade II to III    Soft tissue mobilization to the low back into the buttocks and the right upper trap and the cervical area    Manual Traction gentle occipital release, supine.                       PT Short Term Goals - 05/29/20 6734       PT SHORT TERM GOAL #1   Title independent with an initial HEP    Status Achieved  PT Long Term Goals - 10/19/20 1808       PT LONG TERM GOAL #1   Title Patient will report pain decrease to 6 or better out of 10 with functional activities to improve overall function    Status Partially Met      PT LONG TERM GOAL #2   Title increase right LE strength to 4/5    Status Achieved      PT LONG TERM GOAL #3   Title increase lumbar ROM 25%    Status Achieved      PT LONG TERM GOAL #4   Title walk with a minimal deviation    Status Partially Met                   Plan - 10/19/20 1800     Clinical Impression Statement Patient comes in stating she is having a lot of life stressors, she has increased right cervical and lumbar pain, She has significant knots in the right upper trap and the right cervical area, she is tight and tender in the lumbar area.  with gentle head motions and manual stretches she had a lot of popping of the cervical spine, She had a few instances where she cried out with pain in the right upper trap area and reported it felt  like it was a sharp pain with movements    PT Next Visit Plan Focus on HEPS and home management; continue hip abductor/glute strengthening, core and posterior cervical/shoulder strengthening    Consulted and Agree with Plan of Care Patient             Patient will benefit from skilled therapeutic intervention in order to improve the following deficits and impairments:  Decreased activity tolerance, Decreased endurance, Decreased range of motion, Decreased strength, Pain, Impaired flexibility, Impaired tone, Postural dysfunction, Impaired sensation, Decreased balance, Improper body mechanics, Increased muscle spasms, Abnormal gait  Visit Diagnosis: Chronic bilateral low back pain without sciatica  Difficulty in walking, not elsewhere classified  Cervicalgia  Radiculopathy, cervical region  Cervicogenic headache  Muscle spasm of back  Pain in right hip     Problem List Patient Active Problem List   Diagnosis Date Noted   Common migraine with intractable migraine 12/10/2019   Chronic pain 08/13/2017   Left groin pain 08/05/2017   Occipital neuralgia of right side 10/21/2016   Chronic migraine 01/26/2015   Cervicogenic headache 12/16/2014   Subclinical hypothyroidism 12/16/2014   Memory loss 10/08/2014   Depression 10/08/2014   Generalized anxiety disorder 10/08/2014   Tremor 10/08/2014   Major depressive disorder, single episode, moderate ([place]) 08/27/2014   Memory difficulties 08/27/2014   Degenerative disc disease, cervical 08/27/2014   Chronically on opiate therapy 08/27/2014    Sumner Boast, PT 10/19/2020, 6:11 PM  [place]. [place], [place], [address] Phone: [phone]   Fax:  [phone]  Name: Amanpreet Delmont MRN: [id number] Date of Birth: 1955-12-12

## 2020-10-22 ENCOUNTER — Ambulatory Visit: Payer: Medicare Other | Admitting: Physical Therapy

## 2020-10-27 ENCOUNTER — Ambulatory Visit: Payer: Medicare Other | Admitting: Physical Therapy

## 2020-10-29 ENCOUNTER — Ambulatory Visit: Payer: Medicare Other | Admitting: Physical Therapy

## 2020-11-03 ENCOUNTER — Ambulatory Visit: Payer: Medicare Other | Attending: Neurosurgery | Admitting: Physical Therapy

## 2020-11-03 ENCOUNTER — Other Ambulatory Visit: Payer: Self-pay

## 2020-11-03 ENCOUNTER — Encounter: Payer: Self-pay | Admitting: Physical Therapy

## 2020-11-03 DIAGNOSIS — M62830 Muscle spasm of back: Secondary | ICD-10-CM | POA: Insufficient documentation

## 2020-11-03 DIAGNOSIS — M5412 Radiculopathy, cervical region: Secondary | ICD-10-CM | POA: Insufficient documentation

## 2020-11-03 DIAGNOSIS — M542 Cervicalgia: Secondary | ICD-10-CM | POA: Insufficient documentation

## 2020-11-03 DIAGNOSIS — G8929 Other chronic pain: Secondary | ICD-10-CM | POA: Diagnosis present

## 2020-11-03 DIAGNOSIS — M545 Low back pain, unspecified: Secondary | ICD-10-CM | POA: Diagnosis present

## 2020-11-03 DIAGNOSIS — R262 Difficulty in walking, not elsewhere classified: Secondary | ICD-10-CM | POA: Diagnosis present

## 2020-11-03 DIAGNOSIS — G4486 Cervicogenic headache: Secondary | ICD-10-CM | POA: Diagnosis present

## 2020-11-03 DIAGNOSIS — M25551 Pain in right hip: Secondary | ICD-10-CM | POA: Diagnosis present

## 2020-11-03 DIAGNOSIS — M25561 Pain in right knee: Secondary | ICD-10-CM | POA: Insufficient documentation

## 2020-11-03 NOTE — Therapy (Signed)
[place]. [place], [place], [address] Phone: [phone]   Fax:  [phone]  Physical Therapy Treatment  Patient Details  Name: Toni [name] MRN: [id number] Date of Birth: 1955/05/02 Referring Provider (PT): Macarthur Critchley Date: 11/03/2020   PT End of Session - 11/03/20 1843     Visit Number 32    Date for PT Re-Evaluation 12/03/20    PT Start Time 1803    PT Stop Time 6712    PT Time Calculation (min) 47 min    Activity Tolerance Patient tolerated treatment well;Patient limited by pain    Behavior During Therapy [facility] for tasks assessed/performed             Past Medical History:  Diagnosis Date   Brachial neuritis or radiculitis NOS    Chronic hepatitis C ([place]) 10/05/2015   treated and rsolved   Depressive disorder, not elsewhere classified    Essential and other specified forms of tremor    Fracture of 5th metatarsal 08/13/2017   Fracture of multiple pubic rami ([place]) 08/13/2017   Headache(784.0)    Hepatitis C infection 12/05/2017   Treated and now resolved and "cured " as of early 2018  And      Memory loss    better see evaluation   Microscopic hematuria    seed dr Gaynelle Arabian in past   Migraine, unspecified, without mention of intractable migraine without mention of status migrainosus    Multiple rib fractures 08/13/2017   Other specified disorders of rotator cuff syndrome of shoulder and allied disorders    Vulvar pain    rx with valium supp    Past Surgical History:  Procedure Laterality Date   BREAST EXCISIONAL BIOPSY Right    No scar   CESAREAN SECTION     x3   SHOULDER ARTHROSCOPY W/ ROTATOR CUFF REPAIR Right 2004   SHOULDER ARTHROSCOPY W/ ROTATOR CUFF REPAIR Left 2008    There were no vitals filed for this visit.   Subjective Assessment - 11/03/20 1808     Subjective Reports that she has been sick and has not been able to come in    Currently in Pain? Yes     Pain Score 6     Pain Location Back    Pain Descriptors / Indicators Aching;Tightness    Aggravating Factors  stress, activity                               [facility] Adult PT Treatment/Exercise - 11/03/20 0001       Lumbar Exercises: Aerobic   Nustep Level 5 x 6 minutes      Knee/Hip Exercises: Machines for Strengthening   Cybex Knee Flexion 20# 2x10    Other Machine 5# straight arm      Knee/Hip Exercises: Standing   Walking with Sports Cord 30# x4 all directions- needed PT assist for first rep of each d/t instability.      Manual Therapy   Manual therapy comments passive stretch to HS and pirifromis    Soft tissue mobilization to the right buttock and right ITB                       PT Short Term Goals - 05/29/20 4580       PT SHORT TERM GOAL #1   Title independent with an initial HEP  Status Achieved               PT Long Term Goals - 11/03/20 1845       PT LONG TERM GOAL #1   Title Patient will report pain decrease to 6 or better out of 10 with functional activities to improve overall function    Status Partially Met      PT LONG TERM GOAL #2   Title increase right LE strength to 4/5    Status Achieved      PT LONG TERM GOAL #3   Title increase lumbar ROM 25%    Status Achieved      PT LONG TERM GOAL #4   Title walk with a minimal deviation    Status Partially Met                   Plan - 11/03/20 1843     Clinical Impression Statement Patient very motivated today, she has been sick and is still going through some stressful times in her personal life, she worked hard and reports that she knows she needs to get stronger.  She had right leg weakness with the resisted side stepping where it was giving out some.  Tight Hs and piriformis  some cues for posture needed during exercises    PT Next Visit Plan Focus on HEPS and home management; continue hip abductor/glute strengthening, core and posterior  cervical/shoulder strengthening    Consulted and Agree with Plan of Care Patient             Patient will benefit from skilled therapeutic intervention in order to improve the following deficits and impairments:  Decreased activity tolerance, Decreased endurance, Decreased range of motion, Decreased strength, Pain, Impaired flexibility, Impaired tone, Postural dysfunction, Impaired sensation, Decreased balance, Improper body mechanics, Increased muscle spasms, Abnormal gait  Visit Diagnosis: Chronic bilateral low back pain without sciatica - Plan: PT plan of care cert/re-cert  Difficulty in walking, not elsewhere classified - Plan: PT plan of care cert/re-cert  Cervicalgia - Plan: PT plan of care cert/re-cert  Radiculopathy, cervical region - Plan: PT plan of care cert/re-cert  Cervicogenic headache - Plan: PT plan of care cert/re-cert  Muscle spasm of back - Plan: PT plan of care cert/re-cert  Pain in right hip - Plan: PT plan of care cert/re-cert  Acute pain of right knee - Plan: PT plan of care cert/re-cert     Problem List Patient Active Problem List   Diagnosis Date Noted   Common migraine with intractable migraine 12/10/2019   Chronic pain 08/13/2017   Left groin pain 08/05/2017   Occipital neuralgia of right side 10/21/2016   Chronic migraine 01/26/2015   Cervicogenic headache 12/16/2014   Subclinical hypothyroidism 12/16/2014   Memory loss 10/08/2014   Depression 10/08/2014   Generalized anxiety disorder 10/08/2014   Tremor 10/08/2014   Major depressive disorder, single episode, moderate ([place]) 08/27/2014   Memory difficulties 08/27/2014   Degenerative disc disease, cervical 08/27/2014   Chronically on opiate therapy 08/27/2014    Sumner Boast, PT 11/03/2020, 6:48 PM  [place]. [place], [place], [address] Phone: [phone]   Fax:  [phone]  Name: Toni [name] MRN: [id number] Date of Birth: 1955-08-21

## 2020-11-05 ENCOUNTER — Ambulatory Visit: Payer: Medicare Other | Admitting: Physical Therapy

## 2020-11-09 ENCOUNTER — Ambulatory Visit: Payer: Medicare Other | Admitting: Physical Therapy

## 2020-11-09 ENCOUNTER — Other Ambulatory Visit: Payer: Self-pay

## 2020-11-09 ENCOUNTER — Encounter: Payer: Self-pay | Admitting: Physical Therapy

## 2020-11-09 DIAGNOSIS — M545 Low back pain, unspecified: Secondary | ICD-10-CM | POA: Diagnosis not present

## 2020-11-09 DIAGNOSIS — M25561 Pain in right knee: Secondary | ICD-10-CM

## 2020-11-09 DIAGNOSIS — G8929 Other chronic pain: Secondary | ICD-10-CM

## 2020-11-09 DIAGNOSIS — M25551 Pain in right hip: Secondary | ICD-10-CM

## 2020-11-09 DIAGNOSIS — G4486 Cervicogenic headache: Secondary | ICD-10-CM

## 2020-11-09 DIAGNOSIS — M62830 Muscle spasm of back: Secondary | ICD-10-CM

## 2020-11-09 DIAGNOSIS — R262 Difficulty in walking, not elsewhere classified: Secondary | ICD-10-CM

## 2020-11-09 DIAGNOSIS — M542 Cervicalgia: Secondary | ICD-10-CM

## 2020-11-09 DIAGNOSIS — M5412 Radiculopathy, cervical region: Secondary | ICD-10-CM

## 2020-11-09 NOTE — Therapy (Signed)
[place]. [place], [place], [address] Phone: [phone]   Fax:  [phone]  Physical Therapy Treatment  Patient Details  Name: Toni [name] MRN: [id number] Date of Birth: [birth year] Referring Provider (PT): Macarthur Critchley Date: 11/09/2020   PT End of Session - 11/09/20 1759     Visit Number 33    Date for PT Re-Evaluation 12/03/20    PT Start Time 1720    PT Stop Time 3220    PT Time Calculation (min) 50 min    Activity Tolerance Patient tolerated treatment well;Patient limited by pain    Behavior During Therapy [facility] for tasks assessed/performed             Past Medical History:  Diagnosis Date   Brachial neuritis or radiculitis NOS    Chronic hepatitis C ([place]) 10/05/2015   treated and rsolved   Depressive disorder, not elsewhere classified    Essential and other specified forms of tremor    Fracture of 5th metatarsal 08/13/2017   Fracture of multiple pubic rami ([place]) 08/13/2017   Headache(784.0)    Hepatitis C infection 12/05/2017   Treated and now resolved and "cured " as of early 2018  And      Memory loss    better see evaluation   Microscopic hematuria    seed dr Gaynelle Arabian in past   Migraine, unspecified, without mention of intractable migraine without mention of status migrainosus    Multiple rib fractures 08/13/2017   Other specified disorders of rotator cuff syndrome of shoulder and allied disorders    Vulvar pain    rx with valium supp    Past Surgical History:  Procedure Laterality Date   BREAST EXCISIONAL BIOPSY Right    No scar   CESAREAN SECTION     x3   SHOULDER ARTHROSCOPY W/ ROTATOR CUFF REPAIR Right 2004   SHOULDER ARTHROSCOPY W/ ROTATOR CUFF REPAIR Left 2008    There were no vitals filed for this visit.   Subjective Assessment - 11/09/20 1726     Subjective Patient comes in with a significant trendelenberg on the right, reports pain in the right  hip and the left heel, she reports that she is climbing ladders and moving boxes, reports increased pain    Currently in Pain? Yes    Pain Score 7     Pain Location Hip    Pain Orientation Right    Pain Descriptors / Indicators Aching;Sore    Aggravating Factors  moving                               [facility] Adult PT Treatment/Exercise - 11/09/20 0001       Lumbar Exercises: Stretches   Passive Hamstring Stretch Right;Left;4 reps;20 seconds    ITB Stretch Right;Left;3 reps;20 seconds    Piriformis Stretch Right;Left;4 reps;20 seconds    Gastroc Stretch Right;Left;3 reps;20 seconds      Lumbar Exercises: Supine   Pelvic Tilt 10 reps;5 seconds    Bridge with [employer] 15 reps    Bridge with clamshell 15 reps    Other Supine Lumbar Exercises pelvic tilt with march    Other Supine Lumbar Exercises feet on ball bridges while holding 2 # in each hand 2 x 10      Lumbar Exercises: Sidelying   Clam 20 reps      Manual Therapy   Manual  Therapy Soft tissue mobilization    Soft tissue mobilization to the right buttock and right ITB into the lumbar area                       PT Short Term Goals - 05/29/20 0037       PT SHORT TERM GOAL #1   Title independent with an initial HEP    Status Achieved               PT Long Term Goals - 11/09/20 1804       PT LONG TERM GOAL #2   Title increase right LE strength to 4/5    Status Achieved      PT LONG TERM GOAL #3   Title increase lumbar ROM 25%    Status Achieved      PT LONG TERM GOAL #4   Title walk with a minimal deviation    Status Partially Met                   Plan - 11/09/20 1800     Clinical Impression Statement Patient comes in today in obvious pain, she has a significant limp on the right, she has right hip pain and left heel pain, she reports that she has been doing a lot at home, having to climb ladders and lift boxes of stuff to clean things out and reports doing  too much.  She is very tender in the right buttock and the right ITB.    PT Next Visit Plan Focus on HEPS and home management; continue hip abductor/glute strengthening, core and posterior cervical/shoulder strengthening    Consulted and Agree with Plan of Care Patient             Patient will benefit from skilled therapeutic intervention in order to improve the following deficits and impairments:  Decreased activity tolerance, Decreased endurance, Decreased range of motion, Decreased strength, Pain, Impaired flexibility, Impaired tone, Postural dysfunction, Impaired sensation, Decreased balance, Improper body mechanics, Increased muscle spasms, Abnormal gait  Visit Diagnosis: Chronic bilateral low back pain without sciatica  Difficulty in walking, not elsewhere classified  Cervicalgia  Radiculopathy, cervical region  Cervicogenic headache  Muscle spasm of back  Pain in right hip  Acute pain of right knee     Problem List Patient Active Problem List   Diagnosis Date Noted   Common migraine with intractable migraine 12/10/2019   Chronic pain 08/13/2017   Left groin pain 08/05/2017   Occipital neuralgia of right side 10/21/2016   Chronic migraine 01/26/2015   Cervicogenic headache 12/16/2014   Subclinical hypothyroidism 12/16/2014   Memory loss 10/08/2014   Depression 10/08/2014   Generalized anxiety disorder 10/08/2014   Tremor 10/08/2014   Major depressive disorder, single episode, moderate ([place]) 08/27/2014   Memory difficulties 08/27/2014   Degenerative disc disease, cervical 08/27/2014   Chronically on opiate therapy 08/27/2014    Toni [name], PT 11/09/2020, 6:05 PM  [place]. [place], [place], [address] Phone: [phone]   Fax:  [phone]  Name: Toni [name] MRN: [id number] Date of Birth: [birth year]

## 2020-11-12 ENCOUNTER — Ambulatory Visit: Payer: Medicare Other | Admitting: Physical Therapy

## 2021-01-22 ENCOUNTER — Telehealth: Payer: Self-pay | Admitting: Internal Medicine

## 2021-01-22 NOTE — Telephone Encounter (Signed)
Pt is calling and would like to talk with dr Fabian Sharp personally . Pt son passed away at home in the bed on 12-25-2020. Pt  was found by his son.

## 2021-01-25 NOTE — Telephone Encounter (Signed)
Please advise 

## 2021-01-26 NOTE — Telephone Encounter (Signed)
Patient does not want a Vv at this time and will contact the office when ready to see her PCP, patient has questions about medicare.  Patient stated she is preparing for [place] and arrangements for her son.

## 2021-01-26 NOTE — Telephone Encounter (Signed)
So sorry for your family loss and trauma.  Can add on a video visit Thursday morning if she  wishes.

## 2021-03-17 ENCOUNTER — Telehealth (INDEPENDENT_AMBULATORY_CARE_PROVIDER_SITE_OTHER): Payer: Medicare Other | Admitting: Internal Medicine

## 2021-03-17 DIAGNOSIS — E038 Other specified hypothyroidism: Secondary | ICD-10-CM | POA: Diagnosis not present

## 2021-03-17 DIAGNOSIS — I10 Essential (primary) hypertension: Secondary | ICD-10-CM

## 2021-03-17 DIAGNOSIS — E7801 Familial hypercholesterolemia: Secondary | ICD-10-CM

## 2021-03-17 DIAGNOSIS — J329 Chronic sinusitis, unspecified: Secondary | ICD-10-CM

## 2021-03-17 DIAGNOSIS — Z8619 Personal history of other infectious and parasitic diseases: Secondary | ICD-10-CM

## 2021-03-17 DIAGNOSIS — Z79899 Other long term (current) drug therapy: Secondary | ICD-10-CM | POA: Diagnosis not present

## 2021-03-17 DIAGNOSIS — Z634 Disappearance and death of family member: Secondary | ICD-10-CM

## 2021-03-17 MED ORDER — AMLODIPINE BESYLATE 2.5 MG PO TABS: 2.5000 mg | ORAL_TABLET | Freq: Every day | ORAL | 1 refills | Status: DC

## 2021-03-17 MED ORDER — AMOXICILLIN 500 MG PO CAPS: 500.0000 mg | ORAL_CAPSULE | Freq: Three times a day (TID) | ORAL | 0 refills | Status: AC

## 2021-03-17 MED ORDER — LEVOTHYROXINE SODIUM 25 MCG PO TABS: 25.0000 ug | ORAL_TABLET | Freq: Every day | ORAL | 1 refills | Status: DC

## 2021-03-17 NOTE — Progress Notes (Signed)
?Virtual Visit via Video Note ? ?I connected with Toni [name] on 03/18/21 at 12:30 PM EDT by a video enabled telemedicine application and verified that I am speaking with the correct person using two identifiers. ?Location patient: home ?Location provider:work office ?Persons participating in the virtual visit: patient, provider ? ?[place] national recommendations  regarding COVID 19 pandemic   video visit is advised over in office visit for this patient.  ?Patient aware  of the limitations of evaluation and management by telemedicine and  availability of in person appointments. and agreed to proceed. ? ? ?HPI: ?Toni [name] presents for video visit at the med check for refills of her thyroid and blood pressure medication. ? ?She also has had a week or more of not feeling well sore throat ear pain similar to what she has had before with sinus infections and requests an antibiotic.  No associated fever.   ? ?She is still working through many issues with her son dying suddenly in December.  Just before Christmas. ? ?She has been taking her thyroid medicine every day however ran out and just restarted.  Does not know what her blood pressure is but has been to a specialist and was assumed that her blood pressure was in range. ? ? ?ROS: See pertinent positives and negatives per HPI. ? ?Past Medical History:  ?Diagnosis Date  ? Brachial neuritis or radiculitis NOS   ? Chronic hepatitis C (Toni [name]) 10/05/2015  ? treated and rsolved  ? Depressive disorder, not elsewhere classified   ? Essential and other specified forms of tremor   ? Fracture of 5th metatarsal 08/13/2017  ? Fracture of multiple pubic rami (Toni [name]) 08/13/2017  ? Headache(784.0)   ? Hepatitis C infection 12/05/2017  ? Treated and now resolved and "cured " as of early 05-06-2016  And     ? Memory loss   ? better see evaluation  ? Microscopic hematuria   ? seed dr Toni [name] in past  ? Migraine, unspecified, without mention of intractable migraine  without mention of status migrainosus   ? Multiple rib fractures 08/13/2017  ? Other specified disorders of rotator cuff syndrome of shoulder and allied disorders   ? Vulvar pain   ? rx with valium supp  ? ? ?Past Surgical History:  ?Procedure Laterality Date  ? BREAST EXCISIONAL BIOPSY Right   ? No scar  ? CESAREAN SECTION    ? x3  ? SHOULDER ARTHROSCOPY W/ ROTATOR CUFF REPAIR Right 05-07-02  ? SHOULDER ARTHROSCOPY W/ ROTATOR CUFF REPAIR Left 05-07-2006  ? ? ?Family History  ?Problem Relation Age of Onset  ? Headache Mother   ? Stroke Father   ?     father passed away 15-Jul-2022  ? Diabetes Maternal Grandmother   ? Other Son   ?     Mother is deceased 05-07-2011 father moved to the area 05/06/2012.  ? ? ?Social History  ? ?Tobacco Use  ? Smoking status: Former  ?  Types: Cigarettes  ?  Quit date: 05/23/1978  ?  Years since quitting: 42.8  ? Smokeless tobacco: Never  ?Vaping Use  ? Vaping Use: Never used  ?Substance Use Topics  ? Alcohol use: No  ?  Alcohol/week: 0.0 standard drinks  ? Drug use: No  ? ? ? ? ?Current Outpatient Medications:  ?  albuterol (PROVENTIL HFA;VENTOLIN HFA) 108 (90 Base) MCG/ACT inhaler, Inhale 2 puffs into the lungs every 6 (six) hours as needed for wheezing or shortness of breath., Disp:  1 Inhaler, Rfl: 0 ?  ALPRAZolam (XANAX) 1 MG tablet, Take 1 tablet (1 mg total) by mouth 4 (four) times daily as needed for anxiety (shakes)., Disp: 5 tablet, Rfl: 0 ?  Alum Hydroxide-Mag Trisilicate [id number] MG CHEW, Chew by mouth. CHEW 2 TABLETS BY MOUTH BEFORE EACH MEAL AT BEDTIME AS NEEDED FOR INDIGESTION OR HEARTBURN, Disp: , Rfl:  ?  amoxicillin (AMOXIL) 500 MG capsule, Take 1 capsule (500 mg total) by mouth 3 (three) times daily for 7 days., Disp: 21 capsule, Rfl: 0 ?  cetirizine (ZYRTEC) 10 MG tablet, Take 10 mg by mouth daily as needed for allergies., Disp: , Rfl:  ?  desvenlafaxine (PRISTIQ) 50 MG 24 hr tablet, Take 50 mg by mouth daily., Disp: , Rfl:  ?  dextroamphetamine (DEXEDRINE SPANSULE) 15 MG 24 hr capsule, Take  30-45 mg by mouth daily. 2 po q am and 1 po q noon, Disp: , Rfl:  ?  FLUoxetine (PROZAC) 20 MG capsule, Take 1 capsule by mouth daily., Disp: , Rfl: 0 ?  gabapentin (NEURONTIN) 600 MG tablet, Take 600 mg by mouth. PO TID FOR HEAD PAIN, Disp: , Rfl:  ?  HYDROcodone-ibuprofen (VICOPROFEN) 7.5-200 MG tablet, every 6 (six) hours as needed. , Disp: , Rfl:  ?  ketoconazole (NIZORAL) 2 % cream, APPLY TO AFFECTED AREA TWICE A DAY, Disp: 30 g, Rfl: 2 ?  Misc Natural Products (ESTROVEN + ENERGY MAX STRENGTH) TABS, Take by mouth daily., Disp: , Rfl:  ?  Multiple Vitamins-Minerals (CENTRUM SILVER ADULT 50+) TABS, Take 1 tablet by mouth daily., Disp: , Rfl:  ?  SUMAtriptan (IMITREX) 25 MG tablet, Take 1 tablet (25 mg total) by mouth as needed for migraine. May repeat in 2 hours if headache persists or recurs., Disp: 10 tablet, Rfl: 11 ?  SUMAtriptan 6 MG/0.5ML SOAJ, Use 1 syringe for migraines upon awakening.  Do not use with Imitrex tablets., Disp: 0.5 mL, Rfl: 6 ?  amLODipine (NORVASC) 2.5 MG tablet, Take 1 tablet (2.5 mg total) by mouth daily., Disp: 90 tablet, Rfl: 1 ?  levothyroxine (SYNTHROID) 25 MCG tablet, Take 1 tablet (25 mcg total) by mouth daily before breakfast. Take on an empty stomach, Disp: 90 tablet, Rfl: 1 ? ?EXAM: ?BP Readings from Last 3 Encounters:  ?07/23/20 (!) 152/92  ?04/13/20 122/70  ?12/10/19 (!) 142/83  ? ? ?VITALS per patient if applicable: ? ?GENERAL: alert, oriented, appears well and in no acute distress ? ?HEENT: atraumatic, conjunttiva clear, no obvious abnormalities on inspection of external nose and ears mildy congested ? ?NECK: normal movements of the head and neck ? ?LUNGS: on inspection no signs of respiratory distress, breathing rate appears normal, no obvious gross SOB, gasping or wheezing ? ?CV: no obvious cyanosis ? ?MS: moves all visible extremities without noticeable abnormality ? ?PSYCH/NEURO: pleasant and cooperative, speech and thought processing grossly intact ?Lab Results   ?Component Value Date  ? WBC 4.7 04/07/2020  ? HGB 12.9 04/07/2020  ? HCT 38.6 04/07/2020  ? PLT 217.0 04/07/2020  ? GLUCOSE 92 04/07/2020  ? CHOL 212 (H) 04/07/2020  ? TRIG 115.0 04/07/2020  ? HDL 49.10 04/07/2020  ? LDLCALC 140 (H) 04/07/2020  ? ALT 53 (H) 04/07/2020  ? AST 28 04/07/2020  ? NA 143 04/07/2020  ? K 4.4 04/07/2020  ? CL 105 04/07/2020  ? CREATININE 0.75 04/07/2020  ? BUN 30 (H) 04/07/2020  ? CO2 27 04/07/2020  ? TSH 5.37 (H) 04/07/2020  ? HGBA1C 5.8 02/28/2017  ? ? ?  ASSESSMENT AND PLAN: ? ?Discussed the following assessment and plan: ? ?  ICD-10-CM   ?1. Subclinical hypothyroidism  99991111 Basic metabolic panel  ?  TSH  ?  CBC with Differential/Platelet  ?  Hemoglobin A1c  ?  Hepatic function panel  ?  Lipid panel  ?  ?2. Medication management  123456 Basic metabolic panel  ?  TSH  ?  CBC with Differential/Platelet  ?  Hemoglobin A1c  ?  Hepatic function panel  ?  Lipid panel  ?  ?3. Essential hypertension  99991111 Basic metabolic panel  ?  TSH  ?  CBC with Differential/Platelet  ?  Hemoglobin A1c  ?  Hepatic function panel  ?  Lipid panel  ?  ?4. History of hepatitis C  [id number] Basic metabolic panel  ?  TSH  ?  CBC with Differential/Platelet  ?  Hemoglobin A1c  ?  Hepatic function panel  ?  Lipid panel  ?  ?5. LDL (low density lipoprotein receptor disorder)  123456 Basic metabolic panel  ?  TSH  ?  CBC with Differential/Platelet  ?  Hemoglobin A1c  ?  Hepatic function panel  ?  Lipid panel  ?  ?6. Sinusitis, unspecified chronicity, unspecified location  J32.9   ?  ?7. Death of child  Z63.4   ?  ? ?Due for lab monitoring but has run out of medicine reestablish daily levothyroxine and check lab work in 6 to 8 weeks.  Refill meds today  Then plan follow-up ? ?In regard to the respiratory infection we will do empiric amoxicillin uncertain if it is viral versus bacterial though she has had this problem before benefit may be more than the risk at this time. ?Counseled.  ?Condolences on the loss of her son. ?  Expectant management and discussion of plan and treatment with opportunity to ask questions and all were answered. The patient agreed with the plan and demonstrated an understanding of the instructions. ?  ?Advis

## 2021-04-20 ENCOUNTER — Other Ambulatory Visit: Payer: Medicare Other

## 2021-04-21 ENCOUNTER — Telehealth: Payer: Self-pay | Admitting: Internal Medicine

## 2021-04-21 ENCOUNTER — Other Ambulatory Visit (INDEPENDENT_AMBULATORY_CARE_PROVIDER_SITE_OTHER): Payer: Medicare Other

## 2021-04-21 DIAGNOSIS — E038 Other specified hypothyroidism: Secondary | ICD-10-CM

## 2021-04-21 DIAGNOSIS — Z79899 Other long term (current) drug therapy: Secondary | ICD-10-CM

## 2021-04-21 DIAGNOSIS — Z8619 Personal history of other infectious and parasitic diseases: Secondary | ICD-10-CM

## 2021-04-21 DIAGNOSIS — I10 Essential (primary) hypertension: Secondary | ICD-10-CM

## 2021-04-21 DIAGNOSIS — E7801 Familial hypercholesterolemia: Secondary | ICD-10-CM

## 2021-04-21 LAB — CBC WITH DIFFERENTIAL/PLATELET
Basophils Absolute: 0 10*3/uL (ref 0.0–0.1)
Basophils Relative: 0.4 % (ref 0.0–3.0)
Eosinophils Absolute: 0.1 10*3/uL (ref 0.0–0.7)
Eosinophils Relative: 2 % (ref 0.0–5.0)
HCT: 42.9 % (ref 36.0–46.0)
Hemoglobin: 14 g/dL (ref 12.0–15.0)
Lymphocytes Relative: 35.4 % (ref 12.0–46.0)
Lymphs Abs: 1.9 10*3/uL (ref 0.7–4.0)
MCHC: 32.5 g/dL (ref 30.0–36.0)
MCV: 87.7 fl (ref 78.0–100.0)
Monocytes Absolute: 0.5 10*3/uL (ref 0.1–1.0)
Monocytes Relative: 9.7 % (ref 3.0–12.0)
Neutro Abs: 2.9 10*3/uL (ref 1.4–7.7)
Neutrophils Relative %: 52.5 % (ref 43.0–77.0)
Platelets: 255 10*3/uL (ref 150.0–400.0)
RBC: 4.89 Mil/uL (ref 3.87–5.11)
RDW: 14.4 % (ref 11.5–15.5)
WBC: 5.5 10*3/uL (ref 4.0–10.5)

## 2021-04-21 LAB — BASIC METABOLIC PANEL
BUN: 26 mg/dL — ABNORMAL HIGH (ref 6–23)
CO2: 30 mEq/L (ref 19–32)
Calcium: 9.7 mg/dL (ref 8.4–10.5)
Chloride: 101 mEq/L (ref 96–112)
Creatinine, Ser: 0.93 mg/dL (ref 0.40–1.20)
GFR: 63.58 mL/min (ref >60.00)
Glucose, Bld: 94 mg/dL (ref 70–99)
Potassium: 4.3 mEq/L (ref 3.5–5.1)
Sodium: 138 mEq/L (ref 135–145)

## 2021-04-21 LAB — HEPATIC FUNCTION PANEL
ALT: 24 U/L (ref 0–35)
AST: 29 U/L (ref 0–37)
Albumin: 4.6 g/dL (ref 3.5–5.2)
Alkaline Phosphatase: 81 U/L (ref 39–117)
Bilirubin, Direct: 0 mg/dL (ref 0.0–0.3)
Total Bilirubin: 0.4 mg/dL (ref 0.2–1.2)
Total Protein: 7.8 g/dL (ref 6.0–8.3)

## 2021-04-21 LAB — LIPID PANEL
Cholesterol: 262 mg/dL — ABNORMAL HIGH (ref 0–200)
HDL: 51 mg/dL (ref >39.00)
LDL Cholesterol: 185 mg/dL — ABNORMAL HIGH (ref 0–99)
NonHDL: 210.9
Total CHOL/HDL Ratio: 5
Triglycerides: 129 mg/dL (ref 0.0–149.0)
VLDL: 25.8 mg/dL (ref 0.0–40.0)

## 2021-04-21 LAB — HEMOGLOBIN A1C: Hgb A1c MFr Bld: 5.9 % (ref 4.6–6.5)

## 2021-04-21 LAB — TSH: TSH: 5.58 u[IU]/mL — ABNORMAL HIGH (ref 0.35–5.50)

## 2021-04-21 NOTE — Telephone Encounter (Signed)
Patient is asking to be referred to a nutritionist.  She wants to get on a better diet.  Please return call to patient. ?

## 2021-04-21 NOTE — Telephone Encounter (Signed)
Spoke with patient and scheduled physical 05/12/21 to discuss further. ?

## 2021-04-25 NOTE — Progress Notes (Signed)
Thyroid still slightly off .  Increase levothyroxine to 50 mcg per day   ?Please send in new rx  take 1 po qd disp 90   levothyroxine 50 mcg  ?will review  further at her upcoming visit

## 2021-04-26 ENCOUNTER — Other Ambulatory Visit: Payer: Self-pay

## 2021-04-26 MED ORDER — LEVOTHYROXINE SODIUM 50 MCG PO TABS: 50.0000 ug | ORAL_TABLET | Freq: Every day | ORAL | 3 refills | Status: DC

## 2021-04-29 ENCOUNTER — Encounter: Payer: Self-pay | Admitting: Internal Medicine

## 2021-04-30 ENCOUNTER — Other Ambulatory Visit: Payer: Self-pay | Admitting: Orthopedic Surgery

## 2021-04-30 DIAGNOSIS — M25551 Pain in right hip: Secondary | ICD-10-CM

## 2021-05-03 ENCOUNTER — Ambulatory Visit
Admission: RE | Admit: 2021-05-03 | Discharge: 2021-05-03 | Disposition: A | Discharge status: Home / Self Care | Payer: Medicare Other | Source: Ambulatory Visit | Attending: Orthopedic Surgery | Admitting: Orthopedic Surgery

## 2021-05-03 DIAGNOSIS — M25551 Pain in right hip: Secondary | ICD-10-CM

## 2021-05-05 ENCOUNTER — Telehealth: Payer: Self-pay | Admitting: Internal Medicine

## 2021-05-05 NOTE — Telephone Encounter (Signed)
Pt is calling and had MRI of her hip/leg and the results or on her mychart. Pt has an appt to see ortho on 05-10-2021 and would like like dr Fabian Sharp to take a look at the results. MRI shown she has fibroid in uterus which is nothing new  ?

## 2021-05-05 NOTE — Telephone Encounter (Signed)
Last Vv 03/17/21 ?Please advise  ?

## 2021-05-05 NOTE — Telephone Encounter (Signed)
We can discuss  at  visit  [facility] dr Dion Saucier can help your situation.

## 2021-05-06 NOTE — Telephone Encounter (Signed)
Not sure what to make of the bone findings.  Would ask ortho opinion  and then go from there.  ?No other findings . ?

## 2021-05-06 NOTE — Telephone Encounter (Signed)
Mychart message sent to the patient .

## 2021-05-08 ENCOUNTER — Other Ambulatory Visit: Payer: Medicare Other

## 2021-05-12 ENCOUNTER — Encounter: Payer: Medicare Other | Admitting: Internal Medicine

## 2021-05-19 ENCOUNTER — Encounter: Payer: Self-pay | Admitting: Internal Medicine

## 2021-05-19 ENCOUNTER — Ambulatory Visit (INDEPENDENT_AMBULATORY_CARE_PROVIDER_SITE_OTHER): Payer: Medicare Other | Admitting: Internal Medicine

## 2021-05-19 VITALS — BP 124/78 | HR 96 | Temp 98.3°F | Ht 61.0 in | Wt 190.4 lb

## 2021-05-19 DIAGNOSIS — Z79899 Other long term (current) drug therapy: Secondary | ICD-10-CM | POA: Diagnosis not present

## 2021-05-19 DIAGNOSIS — Z0000 Encounter for general adult medical examination without abnormal findings: Secondary | ICD-10-CM | POA: Diagnosis not present

## 2021-05-19 DIAGNOSIS — I10 Essential (primary) hypertension: Secondary | ICD-10-CM

## 2021-05-19 DIAGNOSIS — E038 Other specified hypothyroidism: Secondary | ICD-10-CM | POA: Diagnosis not present

## 2021-05-19 DIAGNOSIS — E7800 Pure hypercholesterolemia, unspecified: Secondary | ICD-10-CM

## 2021-05-19 DIAGNOSIS — M5030 Other cervical disc degeneration, unspecified cervical region: Secondary | ICD-10-CM

## 2021-05-19 DIAGNOSIS — Z634 Disappearance and death of family member: Secondary | ICD-10-CM

## 2021-05-19 NOTE — Progress Notes (Signed)
Chief Complaint  Patient presents with   Annual Exam    HPI: Patient  Toni [name]  66 y.o. comes in today for Preventive Health Care visit  and med check  Thyroid  taking  regular now  incfrease dose since last  tsh in April in 5 range  Amlodipine   no discussed se of med  bp seems ok  Psych : major  life  events   has  new counselor Celeste Nettles  Neuro : can move neck  still with ha  memory the same. Djd  spine   Hip area  saw Dr Mayra ReelLandaue  for leg pain  some djd in hip /  vs It bad other   to get PT  In regard to lipids  interested in  dietician  nutrition  help referral .  Health Maintenance  Topic Date Due   Pneumonia Vaccine 2865+ Years old (2 - PPSV23 if available, else PCV20) 11/19/2021 (Originally 05/14/2020)   MAMMOGRAM  11/19/2021 (Originally 07/29/2020)   COVID-19 Vaccine (4 - Booster for Pfizer series) 11/19/2021 (Originally 11/21/2019)   Zoster Vaccines- Shingrix (1 of 2) 11/19/2021 (Originally 10/03/1974)   INFLUENZA VACCINE  08/03/2021   COLONOSCOPY (Pts [id number] Insurance coverage will need to be confirmed)  03/29/2026   TETANUS/TDAP  07/20/2027   DEXA SCAN  Completed   Hepatitis C Screening  Completed   HPV VACCINES  Aged Out   Neg tad  ROS:  REST of 12 system review negative except as per HPI   Past Medical History:  Diagnosis Date   Brachial neuritis or radiculitis NOS    Chronic hepatitis C ([facility]) 10/05/2015   treated and rsolved   Depressive disorder, not elsewhere classified    Essential and other specified forms of tremor    Fracture of 5th metatarsal 08/13/2017   Fracture of multiple pubic rami (HCC) 08/13/2017   Headache(784.0)    Hepatitis C infection 12/05/2017   Treated and now resolved and "cured " as of early 2018  And      Memory loss    better see evaluation   Microscopic hematuria    [name] in past   Migraine, unspecified, without mention of intractable migraine without mention of status migrainosus     Multiple rib fractures 08/13/2017   Other specified disorders of rotator cuff syndrome of shoulder and allied disorders    Vulvar pain    rx with valium supp    Past Surgical History:  Procedure Laterality Date   BREAST EXCISIONAL BIOPSY Right    No scar   CESAREAN SECTION     x3   SHOULDER ARTHROSCOPY W/ ROTATOR CUFF REPAIR Right 2004   SHOULDER ARTHROSCOPY W/ ROTATOR CUFF REPAIR Left 2008    Family History  Problem Relation Age of Onset   Headache Mother    Stroke Father        father passed away june17   Diabetes Maternal Grandmother    Other Son        Mother is deceased 2013 father moved to the area 2014.    Social History   Socioeconomic History   Marital status: Divorced    Spouse name: Not on file   Number of children: 3   Years of education: Not on file   Highest education level: Not on file  Occupational History   Not on file  Tobacco Use   Smoking status: Former    Types: Cigarettes    Quit date: 05/23/1978  Years since quitting: 43.0   Smokeless tobacco: Never  Vaping Use   Vaping Use: Never used  Substance and Sexual Activity   Alcohol use: No    Alcohol/week: 0.0 standard drinks   Drug use: No   Sexual activity: Not on file  Other Topics Concern   Not on file  Social History Narrative   Lives with grandson   Left Handed   Drinks 1-2 cups daily   Social Determinants of Health   Financial Resource Strain: Low Risk    Difficulty of Paying Living Expenses: Not hard at all  Food Insecurity: No Food Insecurity   Worried About Programme researcher, broadcasting/film/video in the Last Year: Never true   Ran Out of Food in the Last Year: Never true  Transportation Needs: No Transportation Needs   Lack of Transportation (Medical): No   Lack of Transportation (Non-Medical): No  Physical Activity: Inactive   Days of Exercise per Week: 0 days   Minutes of Exercise per Session: 0 min  Stress: No Stress Concern Present   Feeling of Stress : Not at all  Social Connections:  Moderately Integrated   Frequency of Communication with Friends and Family: More than three times a week   Frequency of Social Gatherings with Friends and Family: More than three times a week   Attends Religious Services: 1 to 4 times per year   Active Member of [employer] or Organizations: Yes   Attends Engineer, structural: More than 4 times per year   Marital Status: Never married    Outpatient Medications Prior to Visit  Medication Sig Dispense Refill   levothyroxine (SYNTHROID) 50 MCG tablet Take 1 tablet (50 mcg total) by mouth daily. 90 tablet 3   albuterol (PROVENTIL HFA;VENTOLIN HFA) 108 (90 Base) MCG/ACT inhaler Inhale 2 puffs into the lungs every 6 (six) hours as needed for wheezing or shortness of breath. 1 Inhaler 0   ALPRAZolam (XANAX) 1 MG tablet Take 1 tablet (1 mg total) by mouth 4 (four) times daily as needed for anxiety (shakes). 5 tablet 0   Alum Hydroxide-Mag Trisilicate 80-14.2 MG CHEW Chew by mouth. CHEW 2 TABLETS BY MOUTH BEFORE EACH MEAL AT BEDTIME AS NEEDED FOR INDIGESTION OR HEARTBURN     amLODipine (NORVASC) 2.5 MG tablet Take 1 tablet (2.5 mg total) by mouth daily. 90 tablet 1   cetirizine (ZYRTEC) 10 MG tablet Take 10 mg by mouth daily as needed for allergies.     desvenlafaxine (PRISTIQ) 50 MG 24 hr tablet Take 50 mg by mouth daily.     dextroamphetamine (DEXEDRINE SPANSULE) 15 MG 24 hr capsule Take 30-45 mg by mouth daily. 2 po q am and 1 po q noon     FLUoxetine (PROZAC) 20 MG capsule Take 1 capsule by mouth daily.  0   gabapentin (NEURONTIN) 600 MG tablet Take 600 mg by mouth. PO TID FOR HEAD PAIN     HYDROcodone-ibuprofen (VICOPROFEN) 7.5-200 MG tablet every 6 (six) hours as needed.      ketoconazole (NIZORAL) 2 % cream APPLY TO AFFECTED AREA TWICE A DAY 30 g 2   Misc Natural Products (ESTROVEN + ENERGY MAX STRENGTH) TABS Take by mouth daily.     Multiple Vitamins-Minerals (CENTRUM SILVER ADULT 50+) TABS Take 1 tablet by mouth daily.     SUMAtriptan  (IMITREX) 25 MG tablet Take 1 tablet (25 mg total) by mouth as needed for migraine. May repeat in 2 hours if headache persists or recurs. 10 tablet  11   SUMAtriptan 6 MG/0.5ML SOAJ Use 1 syringe for migraines upon awakening.  Do not use with Imitrex tablets. 0.5 mL 6   No facility-administered medications prior to visit.     EXAM:  BP 124/78 (BP Location: Right Arm, Patient Position: Sitting, Cuff Size: Normal)   Pulse 96   Temp 98.3 F (36.8 C) (Oral)   Ht 5\' 1"  (1.549 m)   Wt 190 lb 6.4 oz (86.4 kg)   SpO2 98%   BMI 35.98 kg/m   Body mass index is 35.98 kg/m. Wt Readings from Last 3 Encounters:  05/19/21 190 lb 6.4 oz (86.4 kg)  09/09/20 195 lb 3.2 oz (88.5 kg)  09/03/20 195 lb 3.2 oz (88.5 kg)    Physical Exam: Vital signs reviewed 11/03/20 is a well-developed well-nourished alert cooperative    who appearsr stated age . Talking about family disruption and family death  HEENT: normocephalic atraumatic , Eyes: PERRL EOM's full, conjunctiva clear, Nares: paten,t no deformity discharge or tenderness., Ears: no deformity EAC's clear TMs with normal landmarks. NECK: supple without masses, thyromegaly or bruits. Better rom  some stiffness CHEST/PULM:  Clear to auscultation and percussion breath sounds equal no wheeze , rales or rhonchi.  CV: PMI is nondisplaced, S1 S2 no gallops, murmurs, rubs. Peripheral pulses are present .No JVD .  ABDOMEN: Bowel sounds normal nontender  No guard or rebound, no hepato splenomegal no CVA tenderness.a. Extremtities:  No clubbing cyanosis or edema, no acute joint swelling or redness no focal atrophy NEURO:  Oriented x3, cranial nerves 3-12 appear to be intact, no obvious focal weakness,gait within normal limits SKIN: No acute rashes normal turgor, color, no bruising or petechiae. PSYCH: Oriented, good eye contact cognition seems stable  no gross tremor  stressed  LN: no cervical adenopathy  Lab Results  Component Value Date   WBC 5.5 04/21/2021    HGB 14.0 04/21/2021   HCT 42.9 04/21/2021   PLT 255.0 04/21/2021   GLUCOSE 94 04/21/2021   CHOL 262 (H) 04/21/2021   TRIG 129.0 04/21/2021   HDL 51.00 04/21/2021   LDLCALC 185 (H) 04/21/2021   ALT 24 04/21/2021   AST 29 04/21/2021   NA 138 04/21/2021   K 4.3 04/21/2021   CL 101 04/21/2021   CREATININE 0.93 04/21/2021   BUN 26 (H) 04/21/2021   CO2 30 04/21/2021   TSH 5.58 (H) 04/21/2021   HGBA1C 5.9 04/21/2021    BP Readings from Last 3 Encounters:  05/19/21 124/78  07/23/20 (!) 152/92  04/13/20 122/70  The 10-year ASCVD risk score (Arnett DK, et al., 2019) is: 17%   Values used to calculate the score:     Age: 18 years     Sex: Female     Is Non-Hispanic African American: No     Diabetic: No     Tobacco smoker: Yes     Systolic Blood Pressure: 124 mmHg     Is BP treated: Yes     HDL Cholesterol: 51 mg/dL     Total Cholesterol: 262 mg/dL   Lab results reviewed with patient   ASSESSMENT AND PLAN:  Discussed the following assessment and plan:    ICD-10-CM   1. Visit for preventive health examination  Z00.00     2. Subclinical hypothyroidism  E03.8 VITAMIN D 25 Hydroxy (Vit-D Deficiency, Fractures)    Lipid panel    TSH    T4, free    3. Medication management  Z79.899 VITAMIN D 25 Hydroxy (Vit-D  Deficiency, Fractures)    Lipid panel    TSH    T4, free    4. Death of child  Z63.4 VITAMIN D 25 Hydroxy (Vit-D Deficiency, Fractures)    Lipid panel    TSH    T4, free    5. Essential hypertension  I10 VITAMIN D 25 Hydroxy (Vit-D Deficiency, Fractures)    Lipid panel    TSH    T4, free    Candidate for lipid intervention  after thyroid in range    Bp at goal today.  Will follow  Due for mammogram MS  issues  Check Vit D  ( past hx of pelvic fracture) and estrogen deficient Nutrition referral  Return for fasting lab in 2-3 months .  Patient Care Team: Daelen Belvedere, Neta Mends, MD as PCP - General (Internal Medicine) Milagros Evener, MD as Consulting Physician  (Psychiatry) Candace Gallus as Physician Assistant (Neurosurgery) York Spaniel, MD (Inactive) as Consulting Physician (Neurology) Patient Instructions  Need updated  thyroid labs etc in another 2-3  months   Get fasting lipid  panel and  thyroid.  BP is good today .  Mediterranean diet.  Type eating.  Is healthy. Will do  diet nutrition referral.  Get your updated mammogram .     The 10-year ASCVD risk score (Arnett DK, et al., 2019) is: 17%   Values used to calculate the score:     Age: 54 years     Sex: Female     Is Non-Hispanic African American: No     Diabetic: No     Tobacco smoker: Yes     Systolic Blood Pressure: 124 mmHg     Is BP treated: Yes     HDL Cholesterol: 51 mg/dL     Total Cholesterol: 262 mg/dL       Neta Mends. Jennilee Demarco M.D.

## 2021-05-19 NOTE — Patient Instructions (Addendum)
Need updated  thyroid labs etc in another 2-3  months   ?Get fasting lipid  panel and  thyroid.  ?BP is good today .  ?Mediterranean diet.  Type eating.  Is healthy. ?Will do  diet nutrition referral.  ?Get your updated mammogram .  ? ? ? ?The 10-year ASCVD risk score (Arnett DK, et al., 2019) is: 17% ?  Values used to calculate the score: ?    Age: 66 years ?    Sex: Female ?    Is Non-Hispanic African American: No ?    Diabetic: No ?    Tobacco smoker: Yes ?    Systolic Blood Pressure: 124 mmHg ?    Is BP treated: Yes ?    HDL Cholesterol: 51 mg/dL ?    Total Cholesterol: 262 mg/dL ? ? ? ? ? ?

## 2021-05-23 ENCOUNTER — Encounter: Payer: Self-pay | Admitting: Internal Medicine

## 2021-08-31 ENCOUNTER — Ambulatory Visit (INDEPENDENT_AMBULATORY_CARE_PROVIDER_SITE_OTHER): Payer: Medicare Other

## 2021-08-31 ENCOUNTER — Other Ambulatory Visit (INDEPENDENT_AMBULATORY_CARE_PROVIDER_SITE_OTHER): Payer: Medicare Other

## 2021-08-31 VITALS — BP 132/76 | HR 94 | Temp 97.8°F | Ht 61.0 in | Wt 188.2 lb

## 2021-08-31 DIAGNOSIS — Z634 Disappearance and death of family member: Secondary | ICD-10-CM | POA: Diagnosis not present

## 2021-08-31 DIAGNOSIS — I10 Essential (primary) hypertension: Secondary | ICD-10-CM | POA: Diagnosis not present

## 2021-08-31 DIAGNOSIS — Z0000 Encounter for general adult medical examination without abnormal findings: Secondary | ICD-10-CM

## 2021-08-31 DIAGNOSIS — E038 Other specified hypothyroidism: Secondary | ICD-10-CM

## 2021-08-31 DIAGNOSIS — Z79899 Other long term (current) drug therapy: Secondary | ICD-10-CM | POA: Diagnosis not present

## 2021-08-31 LAB — LIPID PANEL
Cholesterol: 253 mg/dL — ABNORMAL HIGH (ref 0–200)
HDL: 59.6 mg/dL (ref >39.00)
LDL Cholesterol: 174 mg/dL — ABNORMAL HIGH (ref 0–99)
NonHDL: 193.36
Total CHOL/HDL Ratio: 4
Triglycerides: 99 mg/dL (ref 0.0–149.0)
VLDL: 19.8 mg/dL (ref 0.0–40.0)

## 2021-08-31 LAB — T4, FREE: Free T4: 0.86 ng/dL (ref 0.60–1.60)

## 2021-08-31 LAB — VITAMIN D 25 HYDROXY (VIT D DEFICIENCY, FRACTURES): VITD: 30.77 ng/mL (ref 30.00–100.00)

## 2021-08-31 LAB — TSH: TSH: 1.29 u[IU]/mL (ref 0.35–5.50)

## 2021-08-31 NOTE — Patient Instructions (Signed)
Toni [name] , Thank you for taking time to come for your Medicare Wellness Visit. I appreciate your ongoing commitment to your health goals. Please review the following plan we discussed and let me know if I can assist you in the future.   Screening recommendations/referrals: Colonoscopy: completed 03/28/2016, due 03/29/2026 Mammogram: declines at this time Bone Density: completed 07/30/2019 Recommended yearly ophthalmology/optometry visit for glaucoma screening and checkup Recommended yearly dental visit for hygiene and checkup  Vaccinations: Influenza vaccine: completed 08/13/2021, due next season Pneumococcal vaccine: due Tdap vaccine: completed 07/19/2017, due 07/20/2027 Shingles vaccine: discussed   Covid-19: 09/26/2019, 02/18/2019, 01/28/2019  Advanced directives: Advance directive discussed with you today. Even though you declined this today please call our office should you change your mind and we can give you the proper paperwork for you to fill out.  Conditions/risks identified: none  Next appointment: Follow up in one year for your annual wellness visit    Preventive Care 68 Years and Older, Female Preventive care refers to lifestyle choices and visits with your health care provider that can promote health and wellness. What does preventive care include? A yearly physical exam. This is also called an annual well check. Dental exams once or twice a year. Routine eye exams. Ask your health care provider how often you should have your eyes checked. Personal lifestyle choices, including: Daily care of your teeth and gums. Regular physical activity. Eating a healthy diet. Avoiding tobacco and drug use. Limiting alcohol use. Practicing safe sex. Taking low-dose aspirin every day. Taking vitamin and mineral supplements as recommended by your health care provider. What happens during an annual well check? The services and screenings done by your health care provider during  your annual well check will depend on your age, overall health, lifestyle risk factors, and family history of disease. Counseling  Your health care provider may ask you questions about your: Alcohol use. Tobacco use. Drug use. Emotional well-being. Home and relationship well-being. Sexual activity. Eating habits. History of falls. Memory and ability to understand (cognition). Work and work Astronomer. Reproductive health. Screening  You may have the following tests or measurements: Height, weight, and BMI. Blood pressure. Lipid and cholesterol levels. These may be checked every 5 years, or more frequently if you are over 26 years old. Skin check. Lung cancer screening. You may have this screening every year starting at age 10 if you have a 30-pack-year history of smoking and currently smoke or have quit within the past 15 years. Fecal occult blood test (FOBT) of the stool. You may have this test every year starting at age 68. Flexible sigmoidoscopy or colonoscopy. You may have a sigmoidoscopy every 5 years or a colonoscopy every 10 years starting at age 68. Hepatitis C blood test. Hepatitis B blood test. Sexually transmitted disease (STD) testing. Diabetes screening. This is done by checking your blood sugar (glucose) after you have not eaten for a while (fasting). You may have this done every 1-3 years. Bone density scan. This is done to screen for osteoporosis. You may have this done starting at age 68. Mammogram. This may be done every 1-2 years. Talk to your health care provider about how often you should have regular mammograms. Talk with your health care provider about your test results, treatment options, and if necessary, the need for more tests. Vaccines  Your health care provider may recommend certain vaccines, such as: Influenza vaccine. This is recommended every year. Tetanus, diphtheria, and acellular pertussis (Tdap, Td) vaccine. You may need  a Td booster every 10  years. Zoster vaccine. You may need this after age 68. Pneumococcal 13-valent conjugate (PCV13) vaccine. One dose is recommended after age 40. Pneumococcal polysaccharide (PPSV23) vaccine. One dose is recommended after age 68. Talk to your health care provider about which screenings and vaccines you need and how often you need them. This information is not intended to replace advice given to you by your health care provider. Make sure you discuss any questions you have with your health care provider. Document Released: 01/16/2015 Document Revised: 09/09/2015 Document Reviewed: 10/21/2014 Elsevier Interactive Patient Education  2017 [place] Prevention in the Home Falls can cause injuries. They can happen to people of all ages. There are many things you can do to make your home safe and to help prevent falls. What can I do on the outside of my home? Regularly fix the edges of walkways and driveways and fix any cracks. Remove anything that might make you trip as you walk through a door, such as a raised step or threshold. Trim any bushes or trees on the path to your home. Use bright outdoor lighting. Clear any walking paths of anything that might make someone trip, such as rocks or tools. Regularly check to see if handrails are loose or broken. Make sure that both sides of any steps have handrails. Any raised decks and porches should have guardrails on the edges. Have any leaves, snow, or ice cleared regularly. Use sand or salt on walking paths during winter. Clean up any spills in your garage right away. This includes oil or grease spills. What can I do in the bathroom? Use night lights. Install grab bars by the toilet and in the tub and shower. Do not use towel bars as grab bars. Use non-skid mats or decals in the tub or shower. If you need to sit down in the shower, use a plastic, non-slip stool. Keep the floor dry. Clean up any water that spills on the floor as soon as it  happens. Remove soap buildup in the tub or shower regularly. Attach bath mats securely with double-sided non-slip rug tape. Do not have throw rugs and other things on the floor that can make you trip. What can I do in the bedroom? Use night lights. Make sure that you have a light by your bed that is easy to reach. Do not use any sheets or blankets that are too big for your bed. They should not hang down onto the floor. Have a firm chair that has side arms. You can use this for support while you get dressed. Do not have throw rugs and other things on the floor that can make you trip. What can I do in the kitchen? Clean up any spills right away. Avoid walking on wet floors. Keep items that you use a lot in easy-to-reach places. If you need to reach something above you, use a strong step stool that has a grab bar. Keep electrical cords out of the way. Do not use floor polish or wax that makes floors slippery. If you must use wax, use non-skid floor wax. Do not have throw rugs and other things on the floor that can make you trip. What can I do with my stairs? Do not leave any items on the stairs. Make sure that there are handrails on both sides of the stairs and use them. Fix handrails that are broken or loose. Make sure that handrails are as long as the stairways. Check  any carpeting to make sure that it is firmly attached to the stairs. Fix any carpet that is loose or worn. Avoid having throw rugs at the top or bottom of the stairs. If you do have throw rugs, attach them to the floor with carpet tape. Make sure that you have a light switch at the top of the stairs and the bottom of the stairs. If you do not have them, ask someone to add them for you. What else can I do to help prevent falls? Wear shoes that: Do not have high heels. Have rubber bottoms. Are comfortable and fit you well. Are closed at the toe. Do not wear sandals. If you use a stepladder: Make sure that it is fully opened.  Do not climb a closed stepladder. Make sure that both sides of the stepladder are locked into place. Ask someone to hold it for you, if possible. Clearly mark and make sure that you can see: Any grab bars or handrails. First and last steps. Where the edge of each step is. Use tools that help you move around (mobility aids) if they are needed. These include: Canes. Walkers. Scooters. Crutches. Turn on the lights when you go into a dark area. Replace any light bulbs as soon as they burn out. Set up your furniture so you have a clear path. Avoid moving your furniture around. If any of your floors are uneven, fix them. If there are any pets around you, be aware of where they are. Review your medicines with your doctor. Some medicines can make you feel dizzy. This can increase your chance of falling. Ask your doctor what other things that you can do to help prevent falls. This information is not intended to replace advice given to you by your health care provider. Make sure you discuss any questions you have with your health care provider. Document Released: 10/16/2008 Document Revised: 05/28/2015 Document Reviewed: 01/24/2014 Elsevier Interactive Patient Education  2017 [employer].

## 2021-08-31 NOTE — Progress Notes (Signed)
Subjective:   Toni [name] is a 66 y.o. female who presents for Medicare Annual (Subsequent) preventive examination.  Review of Systems     Cardiac Risk Factors include: advanced age (>18men, >52 women);obesity (BMI >30kg/m2)     Objective:    Today's Vitals   08/31/21 1017 08/31/21 1024  BP: 132/76   Pulse: 94   Temp: 97.8 F (36.6 C)   TempSrc: Oral   SpO2: 94%   Weight: 188 lb 3.2 oz (85.4 kg)   Height: 5\' 1"  (1.549 m)   PainSc:  6    Body mass index is 35.56 kg/m.     08/31/2021   10:34 AM 07/09/2020    2:56 PM 08/10/2017    5:51 AM 08/08/2017    1:00 AM 08/05/2017    1:24 PM 08/04/2017    1:02 AM 06/05/2014    3:50 PM  Advanced Directives  Does Patient Have a Medical Advance Directive? No Yes No  Yes Yes No  Type of 08/05/2014 of [place];Living will  Living will Out of facility DNR (pink MOST or yellow form) Healthcare Power of [place] of Healthcare Power of Attorney in Chart?  No - copy requested       Would patient like information on creating a medical advance directive? No - Patient declined  No - Patient declined        Current Medications (verified) Outpatient Encounter Medications as of 08/31/2021  Medication Sig   albuterol (PROVENTIL HFA;VENTOLIN HFA) 108 (90 Base) MCG/ACT inhaler Inhale 2 puffs into the lungs every 6 (six) hours as needed for wheezing or shortness of breath.   ALPRAZolam (XANAX) 1 MG tablet Take 1 tablet (1 mg total) by mouth 4 (four) times daily as needed for anxiety (shakes).   amLODipine (NORVASC) 2.5 MG tablet Take 1 tablet (2.5 mg total) by mouth daily.   cetirizine (ZYRTEC) 10 MG tablet Take 10 mg by mouth daily as needed for allergies.   desvenlafaxine (PRISTIQ) 50 MG 24 hr tablet Take 50 mg by mouth daily.   dextroamphetamine (DEXEDRINE SPANSULE) 15 MG 24 hr capsule Take 30-45 mg by mouth daily. 2 po q am and 1 po q noon   FLUoxetine (PROZAC) 20 MG capsule Take 1 capsule by mouth daily.    gabapentin (NEURONTIN) 600 MG tablet Take 600 mg by mouth. PO TID FOR HEAD PAIN   HYDROcodone-ibuprofen (VICOPROFEN) 7.5-200 MG tablet every 6 (six) hours as needed.    ketoconazole (NIZORAL) 2 % cream APPLY TO AFFECTED AREA TWICE A DAY   levothyroxine (SYNTHROID) 50 MCG tablet Take 1 tablet (50 mcg total) by mouth daily.   Misc Natural Products (ESTROVEN + ENERGY MAX STRENGTH) TABS Take by mouth daily.   Multiple Vitamins-Minerals (CENTRUM SILVER ADULT 50+) TABS Take 1 tablet by mouth daily.   SUMAtriptan (IMITREX) 25 MG tablet Take 1 tablet (25 mg total) by mouth as needed for migraine. May repeat in 2 hours if headache persists or recurs.   Alum Hydroxide-Mag Trisilicate 80-14.2 MG CHEW Chew by mouth. CHEW 2 TABLETS BY MOUTH BEFORE EACH MEAL AT BEDTIME AS NEEDED FOR INDIGESTION OR HEARTBURN (Patient not taking: Reported on 08/31/2021)   SUMAtriptan 6 MG/0.5ML SOAJ Use 1 syringe for migraines upon awakening.  Do not use with Imitrex tablets. (Patient not taking: Reported on 08/31/2021)   No facility-administered encounter medications on file as of 08/31/2021.    Allergies (verified) Codeine   History: Past Medical History:  Diagnosis  Date   Brachial neuritis or radiculitis NOS    Chronic hepatitis C (HCC) 10/05/2015   treated and rsolved   Depressive disorder, not elsewhere classified    Essential and other specified forms of tremor    Fracture of 5th metatarsal 08/13/2017   Fracture of multiple pubic rami (HCC) 08/13/2017   Headache(784.0)    Hepatitis C infection 12/05/2017   Treated and now resolved and "cured " as of early 05-12-16  And      Memory loss    better see evaluation   Microscopic hematuria    seed dr Patsi Sears in past   Migraine, unspecified, without mention of intractable migraine without mention of status migrainosus    Multiple rib fractures 08/13/2017   Other specified disorders of rotator cuff syndrome of shoulder and allied disorders    Vulvar pain    rx with  valium supp   Past Surgical History:  Procedure Laterality Date   BREAST EXCISIONAL BIOPSY Right    No scar   CESAREAN SECTION     x3   SHOULDER ARTHROSCOPY W/ ROTATOR CUFF REPAIR Right 2002/05/13   SHOULDER ARTHROSCOPY W/ ROTATOR CUFF REPAIR Left 05-13-06   Family History  Problem Relation Age of Onset   Headache Mother    Stroke Father        father passed away june17   Diabetes Maternal Grandmother    Other Son        Mother is deceased 13-May-2011 father moved to the area 05-12-2012.   Social History   Socioeconomic History   Marital status: Divorced    Spouse name: Not on file   Number of children: 3   Years of education: Not on file   Highest education level: Not on file  Occupational History   Not on file  Tobacco Use   Smoking status: Former    Types: Cigarettes    Quit date: 05/23/1978    Years since quitting: 43.3   Smokeless tobacco: Never  Vaping Use   Vaping Use: Never used  Substance and Sexual Activity   Alcohol use: No    Alcohol/week: 0.0 standard drinks of alcohol   Drug use: Yes    Types: Hydrocodone   Sexual activity: Not Currently  Other Topics Concern   Not on file  Social History Narrative   Lives with grandson   Left Handed   Drinks 1-2 cups daily   Social Determinants of Health   Financial Resource Strain: Low Risk  (08/31/2021)   Overall Financial Resource Strain (CARDIA)    Difficulty of Paying Living Expenses: Not hard at all  Food Insecurity: No Food Insecurity (08/31/2021)   Hunger Vital Sign    Worried About Running Out of Food in the Last Year: Never true    Ran Out of Food in the Last Year: Never true  Transportation Needs: No Transportation Needs (08/31/2021)   PRAPARE - Administrator, Civil Service (Medical): No    Lack of Transportation (Non-Medical): No  Physical Activity: Inactive (08/31/2021)   Exercise Vital Sign    Days of Exercise per Week: 0 days    Minutes of Exercise per Session: 0 min  Stress: Stress Concern Present  (08/31/2021)   [employer] of Occupational Health - Occupational Stress Questionnaire    Feeling of Stress : To some extent  Social Connections: Moderately Integrated (07/09/2020)   Social Connection and Isolation Panel [NHANES]    Frequency of Communication with Friends and Family: More than three  times a week    Frequency of Social Gatherings with Friends and Family: More than three times a week    Attends Religious Services: 1 to 4 times per year    Active Member of [employer] or Organizations: Yes    Attends Engineer, structural: More than 4 times per year    Marital Status: Never married    Tobacco Counseling Counseling given: Not Answered   Clinical Intake:  Pre-visit preparation completed: Yes  Pain : 0-10 Pain Score: 6  Pain Type: Acute pain Pain Location: Head Pain Descriptors / Indicators: Aching Pain Frequency: Intermittent     Nutritional Status: BMI > 30  Obese Diabetes: No  How often do you need to have someone help you when you read instructions, pamphlets, or other written materials from your doctor or pharmacy?: 1 - Never What is the last grade level you completed in school?: bachelors degree  Diabetic?no  Interpreter Needed?: No  Information entered by :: NAllen LPN   Activities of Daily Living    08/31/2021   10:39 AM  In your present state of health, do you have any difficulty performing the following activities:  Hearing? 0  Vision? 0  Difficulty concentrating or making decisions? 1  Comment trouble with making decisions  Walking or climbing stairs? 0  Dressing or bathing? 0  Doing errands, shopping? 0  Preparing Food and eating ? N  Using the Toilet? N  In the past six months, have you accidently leaked urine? Y  Comment slight episodes  Do you have problems with loss of bowel control? N  Managing your Medications? N  Managing your Finances? N  Housekeeping or managing your Housekeeping? N    Patient Care Team: Panosh,  Neta Mends, MD as PCP - General (Internal Medicine) Milagros Evener, MD as Consulting Physician (Psychiatry) Candace Gallus as Physician Assistant (Neurosurgery) York Spaniel, MD (Inactive) as Consulting Physician (Neurology)  Indicate any recent Medical Services you may have received from other than [facility] providers in the past year (date may be approximate).     Assessment:   This is a routine wellness examination for [place].  Hearing/Vision screen Vision Screening - Comments:: Regular eye exams, [facility]  Dietary issues and exercise activities discussed: Current Exercise Habits: The patient does not participate in regular exercise at present   Goals Addressed             This Visit's Progress    Patient Stated       08/31/2021, continue to move, redo things in home       Depression Screen    08/31/2021   10:37 AM 05/19/2021    2:20 PM 07/09/2020    2:59 PM 07/09/2020    2:54 PM 03/05/2018   11:11 AM 02/28/2017   10:44 AM  PHQ 2/9 Scores  PHQ - 2 Score 1 2 0 0 0 0  PHQ- 9 Score  7        Fall Risk    08/31/2021   10:36 AM 05/19/2021    2:20 PM 07/09/2020    2:59 PM 11/18/2019    1:42 PM 04/18/2019    8:48 AM  Fall Risk   Falls in the past year? 0 0 0 0 0  Number falls in past yr: 0 0 0 0   Injury with Fall? 0 0 0 0   Risk for fall due to : Medication side effect No Fall Risks     Follow  up Falls evaluation completed;Education provided;Falls prevention discussed Falls evaluation completed Falls evaluation completed      FALL RISK PREVENTION PERTAINING TO THE HOME:  Any stairs in or around the home? Yes  If so, are there any without handrails? No  Home free of loose throw rugs in walkways, pet beds, electrical cords, etc? Yes  Adequate lighting in your home to reduce risk of falls? Yes   ASSISTIVE DEVICES UTILIZED TO PREVENT FALLS:  Life alert? No  Use of a cane, walker or w/c? No  Grab bars in the bathroom? Yes  Shower chair or  bench in shower? No  Elevated toilet seat or a handicapped toilet? No   TIMED UP AND GO:  Was the test performed? No .     Gait steady and fast without use of assistive device  Cognitive Function:    10/08/2014    3:06 PM  MMSE - Mini Mental State Exam  Orientation to time 4  Orientation to Place 5  Registration 3  Attention/ Calculation 5  Recall 3  Language- name 2 objects 2  Language- repeat 1  Language- follow 3 step command 3  Language- read & follow direction 1  Write a sentence 1  Copy design 1  Total score 29        08/31/2021   10:42 AM  6CIT Screen  What Year? 0 points  What month? 0 points  What time? 0 points  Count back from 20 0 points  Months in reverse 2 points  Repeat phrase 4 points  Total Score 6 points    Immunizations Immunization History  Administered Date(s) Administered   Fluad Quad(high Dose 65+) 09/26/2019   Hep A / Hep B 12/15/2014, 01/16/2015, 06/22/2015   Influenza Inj Mdck Quad Pf 09/28/2018   Influenza, High Dose Seasonal PF 08/13/2021   Influenza,inj,Quad PF,6+ Mos 11/08/2012, 10/14/2014, 09/21/2015, 10/06/2016, 09/07/2017   PFIZER(Purple Top)SARS-COV-2 Vaccination 01/28/2019, 02/18/2019, 09/26/2019   Pfizer Covid-19 Vaccine Bivalent Booster 53yrs & up 08/13/2021   Pneumococcal Conjugate-13 11/08/2012, 05/15/2019   Td 07/19/2017    TDAP status: Up to date  Flu Vaccine status: Up to date  Pneumococcal vaccine status: Up to date  Covid-19 vaccine status: Completed vaccines  Qualifies for Shingles Vaccine? Yes   Zostavax completed No   Shingrix Completed?: No.    Education has been provided regarding the importance of this vaccine. Patient has been advised to call insurance company to determine out of pocket expense if they have not yet received this vaccine. Advised may also receive vaccine at local pharmacy or Health Dept. Verbalized acceptance and understanding.  Screening Tests Health Maintenance  Topic Date Due    Pneumonia Vaccine 81+ Years old (2 - PPSV23 or PCV20) 11/19/2021 (Originally 07/10/2019)   MAMMOGRAM  11/19/2021 (Originally 07/29/2020)   Zoster Vaccines- Shingrix (1 of 2) 11/19/2021 (Originally 10/03/1974)   COVID-19 Vaccine (5 - Pfizer risk series) 10/08/2021   COLONOSCOPY (Pts 45-13yrs Insurance coverage will need to be confirmed)  03/29/2026   TETANUS/TDAP  07/20/2027   INFLUENZA VACCINE  Completed   DEXA SCAN  Completed   Hepatitis C Screening  Completed   HPV VACCINES  Aged Out    Health Maintenance  There are no preventive care reminders to display for this patient.   Colorectal cancer screening: Type of screening: Colonoscopy. Completed 03/28/2016. Repeat every 10 years  Mammogram status: declines  Bone Density status: Completed 07/30/2019.  Lung Cancer Screening: (Low Dose CT Chest recommended if Age 60-80 years,  30 pack-year currently smoking OR have quit w/in 15years.) does not qualify.   Lung Cancer Screening Referral: no  Additional Screening:  Hepatitis C Screening: does qualify; Completed 04/21/2021  Vision Screening: Recommended annual ophthalmology exams for early detection of glaucoma and other disorders of the eye. Is the patient up to date with their annual eye exam?  Yes  Who is the provider or what is the name of the office in which the patient attends annual eye exams? [facility] If pt is not established with a provider, would they like to be referred to a provider to establish care? No .   Dental Screening: Recommended annual dental exams for proper oral hygiene  Community Resource Referral / Chronic Care Management: CRR required this visit?  No   CCM required this visit?  No      Plan:     I have personally reviewed and noted the following in the patient's chart:   Medical and social history Use of alcohol, tobacco or illicit drugs  Current medications and supplements including opioid prescriptions. Patient is currently taking opioid  prescriptions. Information provided to patient regarding non-opioid alternatives. Patient advised to discuss non-opioid treatment plan with their provider. Functional ability and status Nutritional status Physical activity Advanced directives List of other physicians Hospitalizations, surgeries, and ER visits in previous 12 months Vitals Screenings to include cognitive, depression, and falls Referrals and appointments  In addition, I have reviewed and discussed with patient certain preventive protocols, quality metrics, and best practice recommendations. A written personalized care plan for preventive services as well as general preventive health recommendations were provided to patient.     Barb Merinoickeah E Yilin Weedon, LPN   [id number]/29/2023   Nurse Notes: none

## 2021-09-02 ENCOUNTER — Encounter: Payer: Self-pay | Admitting: Internal Medicine

## 2021-09-12 NOTE — Progress Notes (Signed)
See messagees

## 2021-09-12 NOTE — Telephone Encounter (Signed)
Apologies for delay   Thyroid is much better  in correct range  Yes Vit d is low normal  and could take a bit more of supplement. Cholesterol about the same   Can consider  getting coronary calcium ct scan  (but you would have to  self pay 99$ ) if we want to get more information  about cholesterol risk.  A zero score is reassuring  and can Continue lifestyle intervention healthy eating and activity  . As possible .  If elevated  you may want to add a statin medication or  other medication to help  bring down cholesterol . No rush  but let us know if you want  me to order this test.

## 2021-09-15 ENCOUNTER — Other Ambulatory Visit: Payer: Self-pay | Admitting: Internal Medicine

## 2021-10-13 ENCOUNTER — Telehealth: Payer: Self-pay | Admitting: Internal Medicine

## 2021-10-13 NOTE — Telephone Encounter (Signed)
Pt called to request a refill of the following:  albuterol (PROVENTIL HFA;VENTOLIN HFA) 108 (90 Base) MCG/ACT inhaler  as she is having SOB, due to allergies.  Pt realizes this was prescribed by another provider, but she does not see him anymore.  Also, Pt is asking for a nasal spray, for her allergies.  Please advise.  LOV:  05/19/21 = CPE  [facility] [id number] [facility], [place] - [place] Phone:  [phone]  Fax:  [phone]

## 2021-10-14 NOTE — Telephone Encounter (Signed)
Ok to refill the albuterol inhaler x 1 ,  also send in flonase nasal spray 2 sprays each nostril once a day for nasal allergies  disp 1 refill x 5

## 2021-10-14 NOTE — Telephone Encounter (Signed)
Pt called to follow up on refill request. Pt states MD can send whatever she thinks would work. Pt would also like to inform MD that she is really going through it, because he son passed away and now that she has to give up custody of her grandson.

## 2021-10-15 MED ORDER — FLUTICASONE PROPIONATE 50 MCG/ACT NA SUSP: 1.0000 | Freq: Every day | NASAL | 5 refills | Status: DC

## 2021-10-15 MED ORDER — ALBUTEROL SULFATE HFA 108 (90 BASE) MCG/ACT IN AERS
2.0000 | INHALATION_SPRAY | Freq: Four times a day (QID) | RESPIRATORY_TRACT | 1 refills | Status: AC | PRN
Start: 2021-10-15 — End: ?

## 2021-10-15 NOTE — Addendum Note (Signed)
Addended by: Encarnacion Slates on: 10/15/2021 05:25 PM   Modules accepted: Orders

## 2021-10-15 NOTE — Telephone Encounter (Signed)
Rx sent 

## 2021-11-05 ENCOUNTER — Other Ambulatory Visit: Payer: Self-pay | Admitting: Internal Medicine

## 2021-11-05 DIAGNOSIS — Z1231 Encounter for screening mammogram for malignant neoplasm of breast: Secondary | ICD-10-CM

## 2022-01-05 ENCOUNTER — Ambulatory Visit
Admission: RE | Admit: 2022-01-05 | Discharge: 2022-01-05 | Disposition: A | Discharge status: Home / Self Care | Payer: Medicare Other | Source: Ambulatory Visit | Attending: Internal Medicine | Admitting: Internal Medicine

## 2022-01-05 DIAGNOSIS — Z1231 Encounter for screening mammogram for malignant neoplasm of breast: Secondary | ICD-10-CM

## 2022-01-10 ENCOUNTER — Ambulatory Visit: Payer: Medicare Other | Admitting: Family Medicine

## 2022-01-17 ENCOUNTER — Ambulatory Visit: Payer: Medicare Other | Admitting: Family Medicine

## 2022-01-19 ENCOUNTER — Encounter: Payer: Self-pay | Admitting: Family Medicine

## 2022-01-19 ENCOUNTER — Ambulatory Visit: Payer: Medicare Other | Admitting: Family Medicine

## 2022-01-19 VITALS — BP 118/76 | HR 92 | Temp 98.1°F | Wt 187.6 lb

## 2022-01-19 DIAGNOSIS — R233 Spontaneous ecchymoses: Secondary | ICD-10-CM

## 2022-01-19 NOTE — Progress Notes (Signed)
   Subjective:    Patient ID: Toni [name], female    DOB: [birth year], 67 y.o.   MRN: [id number]  HPI Here with concerns about easy bruising. She says she has always been "an easy bruiser" but over the past 2 months this has gotten worse. She now has multiple non-tender bruises on her body, primarily arms and trunk. None of the medications she takes would make her prone to this except Ibuprofen, but she only takes 400 mg of this a day. She does not take ASA.    Review of Systems  Constitutional: Negative.   Respiratory: Negative.    Cardiovascular: Negative.   Gastrointestinal: Negative.   Genitourinary: Negative.   Hematological:  Bruises/bleeds easily.       Objective:   Physical Exam Constitutional:      Appearance: Normal appearance. She is not ill-appearing.  Cardiovascular:     Rate and Rhythm: Normal rate and regular rhythm.     Pulses: Normal pulses.     Heart sounds: Normal heart sounds.  Pulmonary:     Effort: Pulmonary effort is normal.     Breath sounds: Normal breath sounds.  Skin:    Comments: There numerous small purple ecchymoses on the arms and trunk   Neurological:     Mental Status: She is alert.           Assessment & Plan:  Easy bruising of uncertain etiology. We will check labs including CBC, PT, PTT, etc and go from there.  Alysia Penna, MD

## 2022-01-20 LAB — HEPATIC FUNCTION PANEL
ALT: 19 U/L (ref 0–35)
AST: 26 U/L (ref 0–37)
Albumin: 4.7 g/dL (ref 3.5–5.2)
Alkaline Phosphatase: 87 U/L (ref 39–117)
Bilirubin, Direct: 0.1 mg/dL (ref 0.0–0.3)
Total Bilirubin: 0.3 mg/dL (ref 0.2–1.2)
Total Protein: 8 g/dL (ref 6.0–8.3)

## 2022-01-20 LAB — BASIC METABOLIC PANEL
BUN: 30 mg/dL — ABNORMAL HIGH (ref 6–23)
CO2: 29 mEq/L (ref 19–32)
Calcium: 9.8 mg/dL (ref 8.4–10.5)
Chloride: 101 mEq/L (ref 96–112)
Creatinine, Ser: 1.35 mg/dL — ABNORMAL HIGH (ref 0.40–1.20)
GFR: 40.44 mL/min — ABNORMAL LOW (ref >60.00)
Glucose, Bld: 83 mg/dL (ref 70–99)
Potassium: 4.2 mEq/L (ref 3.5–5.1)
Sodium: 141 mEq/L (ref 135–145)

## 2022-01-20 LAB — CBC WITH DIFFERENTIAL/PLATELET
Basophils Absolute: 0 10*3/uL (ref 0.0–0.1)
Basophils Relative: 0.6 % (ref 0.0–3.0)
Eosinophils Absolute: 0 10*3/uL (ref 0.0–0.7)
Eosinophils Relative: 0.3 % (ref 0.0–5.0)
HCT: 43.1 % (ref 36.0–46.0)
Hemoglobin: 14.4 g/dL (ref 12.0–15.0)
Lymphocytes Relative: 20.4 % (ref 12.0–46.0)
Lymphs Abs: 1.5 10*3/uL (ref 0.7–4.0)
MCHC: 33.4 g/dL (ref 30.0–36.0)
MCV: 89.2 fl (ref 78.0–100.0)
Monocytes Absolute: 0.6 10*3/uL (ref 0.1–1.0)
Monocytes Relative: 8.5 % (ref 3.0–12.0)
Neutro Abs: 5.2 10*3/uL (ref 1.4–7.7)
Neutrophils Relative %: 70.2 % (ref 43.0–77.0)
Platelets: 243 10*3/uL (ref 150.0–400.0)
RBC: 4.83 Mil/uL (ref 3.87–5.11)
RDW: 13.7 % (ref 11.5–15.5)
WBC: 7.3 10*3/uL (ref 4.0–10.5)

## 2022-01-20 LAB — PROTIME-INR
INR: 1 ratio (ref 0.8–1.0)
Prothrombin Time: 10.9 s (ref 9.6–13.1)

## 2022-01-20 LAB — TSH: TSH: 2.9 u[IU]/mL (ref 0.35–5.50)

## 2022-01-20 LAB — APTT: aPTT: 31 s (ref 25.4–36.8)

## 2022-01-27 ENCOUNTER — Encounter: Payer: Self-pay | Admitting: Family Medicine

## 2022-01-27 ENCOUNTER — Telehealth (INDEPENDENT_AMBULATORY_CARE_PROVIDER_SITE_OTHER): Payer: Medicare Other | Admitting: Family Medicine

## 2022-01-27 VITALS — Ht 61.0 in | Wt 187.0 lb

## 2022-01-27 DIAGNOSIS — U071 COVID-19: Secondary | ICD-10-CM | POA: Diagnosis not present

## 2022-01-27 MED ORDER — MOLNUPIRAVIR EUA 200MG CAPSULE: 4.0000 | ORAL_CAPSULE | Freq: Two times a day (BID) | ORAL | 0 refills | Status: AC

## 2022-01-27 NOTE — Progress Notes (Signed)
Virtual Visit via Video Note  I connected with Toni [name] on 01/27/22 at  4:45 PM EST by a video enabled telemedicine application 2/2 [id number] pandemic and verified that I am speaking with the correct person using two identifiers.  Location patient: home Location provider:work or home office Persons participating in the virtual visit: patient, provider  I discussed the limitations of evaluation and management by telemedicine and the availability of in person appointments. The patient expressed understanding and agreed to proceed.  Chief Complaint  Patient presents with   Covid Positive    Pt reports she tested positive for covid today. Reports sx of productive cough- white phlegm, chills,body ache and feeling tired is not new, sore throat and runny nose.  Started feeling bad on Monday.     HPI: Pt is a 67 year old female followed by Dr. Regis Bill and seen for acute concern.  Pt had a cold on Monday.  Felt better after staying in bed.  Today developed mildly productive cough, rhinorrhea, ST. Denies fever, HA, diarrhea, decreased appetite. Tested positive for COVID today. Pt unsure where she may have gotten COVID. Pt has not tried anything for symptoms.  Pt initially thought her symptoms were from changing antidepressant.  She was supposed   ROS: See pertinent positives and negatives per HPI.  Past Medical History:  Diagnosis Date   Brachial neuritis or radiculitis NOS    Chronic hepatitis C ([place]) 10/05/2015   treated and rsolved   Depressive disorder, not elsewhere classified    Essential and other specified forms of tremor    Fracture of 5th metatarsal 08/13/2017   Fracture of multiple pubic rami ([place]) 08/13/2017   Headache(784.0)    Hepatitis C infection 12/05/2017   Treated and now resolved and "cured " as of early Apr 28, 2016  And      Memory loss    better see evaluation   Microscopic hematuria    seed dr Gaynelle Arabian in past   Migraine, unspecified, without mention of  intractable migraine without mention of status migrainosus    Multiple rib fractures 08/13/2017   Other specified disorders of rotator cuff syndrome of shoulder and allied disorders    Vulvar pain    rx with valium supp    Past Surgical History:  Procedure Laterality Date   BREAST EXCISIONAL BIOPSY Right    No scar   CESAREAN SECTION     x3   SHOULDER ARTHROSCOPY W/ ROTATOR CUFF REPAIR Right Apr 29, 2002   SHOULDER ARTHROSCOPY W/ ROTATOR CUFF REPAIR Left April 29, 2006    Family History  Problem Relation Age of Onset   Headache Mother    Stroke Father        father passed away 12-Jul-2022   Diabetes Maternal Grandmother    Other Son        Mother is deceased 29-Apr-2011 father moved to the area 04/28/12.   Current Outpatient Medications:    albuterol (VENTOLIN HFA) 108 (90 Base) MCG/ACT inhaler, Inhale 2 puffs into the lungs every 6 (six) hours as needed for wheezing or shortness of breath., Disp: 1 each, Rfl: 1   Alum Hydroxide-Mag Trisilicate 41-28.7 MG CHEW, Chew by mouth. CHEW 2 TABLETS BY MOUTH BEFORE EACH MEAL AT BEDTIME AS NEEDED FOR INDIGESTION OR HEARTBURN, Disp: , Rfl:    amLODipine (NORVASC) 2.5 MG tablet, TAKE ONE TABLET BY MOUTH DAILY, Disp: 90 tablet, Rfl: 1   cetirizine (ZYRTEC) 10 MG tablet, Take 10 mg by mouth daily as needed for allergies., Disp: , Rfl:  clonazePAM (KLONOPIN) 0.5 MG tablet, Take by mouth., Disp: , Rfl:    dextroamphetamine (DEXEDRINE SPANSULE) 15 MG 24 hr capsule, Take 30-45 mg by mouth daily. 2 po q am and 1 po q noon 01/27/22: weaning it down to 1 a day., Disp: , Rfl:    fluticasone (FLONASE) 50 MCG/ACT nasal spray, Place 1 spray into both nostrils daily., Disp: 16 g, Rfl: 5   gabapentin (NEURONTIN) 600 MG tablet, Take 600 mg by mouth. PO TID FOR HEAD PAIN, Disp: , Rfl:    HYDROcodone-ibuprofen (VICOPROFEN) 7.5-200 MG tablet, every 6 (six) hours as needed. , Disp: , Rfl:    ketoconazole (NIZORAL) 2 % cream, APPLY TO AFFECTED AREA TWICE A DAY, Disp: 30 g, Rfl: 2    levothyroxine (SYNTHROID) 50 MCG tablet, Take 1 tablet (50 mcg total) by mouth daily., Disp: 90 tablet, Rfl: 3   Misc Natural Products (ESTROVEN + ENERGY MAX STRENGTH) TABS, Take by mouth daily., Disp: , Rfl:    molnupiravir EUA (LAGEVRIO) 200 mg CAPS capsule, Take 4 capsules (800 mg total) by mouth 2 (two) times daily for 5 days., Disp: 40 capsule, Rfl: 0   Multiple Vitamins-Minerals (CENTRUM SILVER ADULT 50+) TABS, Take 1 tablet by mouth daily., Disp: , Rfl:    sertraline (ZOLOFT) 100 MG tablet, Take 200 mg by mouth daily., Disp: , Rfl:    SUMAtriptan (IMITREX) 25 MG tablet, Take 1 tablet (25 mg total) by mouth as needed for migraine. May repeat in 2 hours if headache persists or recurs., Disp: 10 tablet, Rfl: 11   SUMAtriptan 6 MG/0.5ML SOAJ, Use 1 syringe for migraines upon awakening.  Do not use with Imitrex tablets., Disp: 0.5 mL, Rfl: 6   ALPRAZolam (XANAX) 1 MG tablet, Take 1 tablet (1 mg total) by mouth 4 (four) times daily as needed for anxiety (shakes). (Patient not taking: Reported on 01/27/2022), Disp: 5 tablet, Rfl: 0   desvenlafaxine (PRISTIQ) 50 MG 24 hr tablet, Take 50 mg by mouth daily. (Patient not taking: Reported on 01/27/2022), Disp: , Rfl:    FLUoxetine (PROZAC) 20 MG capsule, Take 1 capsule by mouth daily. (Patient not taking: Reported on 01/27/2022), Disp: , Rfl: 0  EXAM:  VITALS per patient if applicable:  RR between 12-20 bpm  GENERAL: alert, oriented, appears well and in no acute distress  HEENT: atraumatic, conjunctiva clear, no obvious abnormalities on inspection of external nose and ears  NECK: normal movements of the head and neck  LUNGS: on inspection no signs of respiratory distress, breathing rate appears normal, no obvious gross SOB, gasping or wheezing  CV: no obvious cyanosis  MS: moves all visible extremities without noticeable abnormality  PSYCH/NEURO: pleasant and cooperative, no obvious depression or anxiety, speech and thought processing grossly  intact  ASSESSMENT AND PLAN:  Discussed the following assessment and plan:  COVID-19 virus infection -symptoms started in 01/24/22.   Positive COVID test today 01/27/22 -discussed r/b/a of antiviral medications.  Patient wishes to start medication. -OTC meds as needed for supportive care, hydration -Discussed quarantine -Given strict precautions  - Plan: molnupiravir EUA (LAGEVRIO) 200 mg CAPS capsule  Follow-up as needed for continued or worsening symptoms.   I discussed the assessment and treatment plan with the patient. The patient was provided an opportunity to ask questions and all were answered. The patient agreed with the plan and demonstrated an understanding of the instructions.   The patient was advised to call back or seek an in-person evaluation if the symptoms worsen or  if the condition fails to improve as anticipated.    Billie Ruddy, MD

## 2022-02-01 ENCOUNTER — Ambulatory Visit: Payer: Medicare Other | Admitting: Internal Medicine

## 2022-02-03 ENCOUNTER — Ambulatory Visit: Payer: Medicare Other | Admitting: Neurology

## 2022-02-23 ENCOUNTER — Encounter: Payer: Self-pay | Admitting: Internal Medicine

## 2022-02-23 ENCOUNTER — Ambulatory Visit: Payer: Medicare Other | Admitting: Internal Medicine

## 2022-02-23 VITALS — BP 120/66 | HR 97 | Temp 97.5°F | Ht 61.0 in | Wt 186.4 lb

## 2022-02-23 DIAGNOSIS — F321 Major depressive disorder, single episode, moderate: Secondary | ICD-10-CM | POA: Diagnosis not present

## 2022-02-23 DIAGNOSIS — Z79899 Other long term (current) drug therapy: Secondary | ICD-10-CM

## 2022-02-23 DIAGNOSIS — R7989 Other specified abnormal findings of blood chemistry: Secondary | ICD-10-CM

## 2022-02-23 DIAGNOSIS — F411 Generalized anxiety disorder: Secondary | ICD-10-CM

## 2022-02-23 DIAGNOSIS — R233 Spontaneous ecchymoses: Secondary | ICD-10-CM

## 2022-02-23 NOTE — Progress Notes (Signed)
Chief Complaint  Patient presents with   Follow-up    Pt reports she wants to discuss with Dr. Regis Bill on weaning off of antidepressant.    Bleeding/Bruising    Pt reports she noticed bruising on her L thigh, L arm on Sunday.     HPI: Toni [name] 67 y.o. come in for above  Antidepressant changing fromo prostiq prozac to sertraline    saw dr Sarajane Jews for excessive bruising mostly legs and arms but pretty extensive  with ? Minor contact.  No nosebleeds other lbleeding  Labs wer unrevealing  concern that the antidepressants could be culprit .  ? About weaning off meds all together  See Dr Toy Care   for meds . Takes vicoprofen for pain (  plain hydrocodone not control pain and avoiding lots of tylneol wieth hx of liver hep c. )  has been on this for a while and no bruising ROS: See pertinent positives and negatives per HPI.  Past Medical History:  Diagnosis Date   Brachial neuritis or radiculitis NOS    Chronic hepatitis C ([place]) 10/05/2015   treated and rsolved   Depressive disorder, not elsewhere classified    Essential and other specified forms of tremor    Fracture of 5th metatarsal 08/13/2017   Fracture of multiple pubic rami ([place]) 08/13/2017   Headache(784.0)    Hepatitis C infection 12/05/2017   Treated and now resolved and "cured " as of early 03-17-16  And      Memory loss    better see evaluation   Microscopic hematuria    seed dr Gaynelle Arabian in past   Migraine, unspecified, without mention of intractable migraine without mention of status migrainosus    Multiple rib fractures 08/13/2017   Other specified disorders of rotator cuff syndrome of shoulder and allied disorders    Vulvar pain    rx with valium supp    Family History  Problem Relation Age of Onset   Headache Mother    Stroke Father        father passed away 2022/06/24   Diabetes Maternal Grandmother    Other Son        Mother is deceased 2011-03-18 father moved to the area 03-17-12.    Social History    Socioeconomic History   Marital status: Divorced    Spouse name: Not on file   Number of children: 3   Years of education: Not on file   Highest education level: Bachelor's degree (e.g., BA, AB, BS)  Occupational History   Not on file  Tobacco Use   Smoking status: Former    Types: Cigarettes    Quit date: 05/23/1978    Years since quitting: 43.7   Smokeless tobacco: Never  Vaping Use   Vaping Use: Never used  Substance and Sexual Activity   Alcohol use: No    Alcohol/week: 0.0 standard drinks of alcohol   Drug use: Yes    Types: Hydrocodone   Sexual activity: Not Currently  Other Topics Concern   Not on file  Social History Narrative   Lives with grandson   Left Handed   Drinks 1-2 cups daily   Social Determinants of Health   Financial Resource Strain: Low Risk  (02/22/2022)   Overall Financial Resource Strain (CARDIA)    Difficulty of Paying Living Expenses: Not very hard  Food Insecurity: No Food Insecurity (02/22/2022)   Hunger Vital Sign    Worried About Running Out of Food in the Last Year:  Never true    Ran Out of Food in the Last Year: Never true  Transportation Needs: No Transportation Needs (02/22/2022)   PRAPARE - Hydrologist (Medical): No    Lack of Transportation (Non-Medical): No  Physical Activity: Inactive (02/22/2022)   Exercise Vital Sign    Days of Exercise per Week: 0 days    Minutes of Exercise per Session: 0 min  Stress: Stress Concern Present (02/22/2022)   [place]    Feeling of Stress : Very much  Social Connections: Unknown (02/22/2022)   Social Connection and Isolation Panel [NHANES]    Frequency of Communication with Friends and Family: Once a week    Frequency of Social Gatherings with Friends and Family: Patient refused    Attends Religious Services: 1 to 4 times per year    Active Member of [employer] or Organizations: Yes    Attends Programme researcher, broadcasting/film/video: More than 4 times per year    Marital Status: Divorced    Outpatient Medications Prior to Visit  Medication Sig Dispense Refill   albuterol (VENTOLIN HFA) 108 (90 Base) MCG/ACT inhaler Inhale 2 puffs into the lungs every 6 (six) hours as needed for wheezing or shortness of breath. 1 each 1   Alum Hydroxide-Mag Trisilicate [id number] MG CHEW Chew by mouth. CHEW 2 TABLETS BY MOUTH BEFORE EACH MEAL AT BEDTIME AS NEEDED FOR INDIGESTION OR HEARTBURN     amLODipine (NORVASC) 2.5 MG tablet TAKE ONE TABLET BY MOUTH DAILY 90 tablet 1   cetirizine (ZYRTEC) 10 MG tablet Take 10 mg by mouth daily as needed for allergies.     dextroamphetamine (DEXEDRINE SPANSULE) 15 MG 24 hr capsule Take 30-45 mg by mouth daily. 2 po q am and 1 po q noon 01/27/22: weaning it down to 1 a day.     fluticasone (FLONASE) 50 MCG/ACT nasal spray Place 1 spray into both nostrils daily. 16 g 5   gabapentin (NEURONTIN) 600 MG tablet Take 600 mg by mouth. PO TID FOR HEAD PAIN     HYDROcodone-ibuprofen (VICOPROFEN) 7.5-200 MG tablet every 6 (six) hours as needed.      ketoconazole (NIZORAL) 2 % cream APPLY TO AFFECTED AREA TWICE A DAY 30 g 2   levothyroxine (SYNTHROID) 50 MCG tablet Take 1 tablet (50 mcg total) by mouth daily. 90 tablet 3   Misc Natural Products (ESTROVEN + ENERGY MAX STRENGTH) TABS Take by mouth daily.     Multiple Vitamins-Minerals (CENTRUM SILVER ADULT 50+) TABS Take 1 tablet by mouth daily.     sertraline (ZOLOFT) 100 MG tablet Take 200 mg by mouth daily.     SUMAtriptan (IMITREX) 25 MG tablet Take 1 tablet (25 mg total) by mouth as needed for migraine. May repeat in 2 hours if headache persists or recurs. 10 tablet 11   SUMAtriptan 6 MG/0.5ML SOAJ Use 1 syringe for migraines upon awakening.  Do not use with Imitrex tablets. 0.5 mL 6   ALPRAZolam (XANAX) 1 MG tablet Take 1 tablet (1 mg total) by mouth 4 (four) times daily as needed for anxiety (shakes). (Patient not taking: Reported on  01/27/2022) 5 tablet 0   clonazePAM (KLONOPIN) 0.5 MG tablet Take by mouth. (Patient not taking: Reported on 02/23/2022)     desvenlafaxine (PRISTIQ) 50 MG 24 hr tablet Take 50 mg by mouth daily. (Patient not taking: Reported on 01/27/2022)     FLUoxetine (PROZAC) 20 MG capsule Take  1 capsule by mouth daily. (Patient not taking: Reported on 01/27/2022)  0   No facility-administered medications prior to visit.     EXAM:  BP 120/66 (BP Location: Right Arm, Patient Position: Sitting, Cuff Size: Large)   Pulse 97   Temp (!) 97.5 F (36.4 C) (Oral)   Ht 5' 1"$  (1.549 m)   Wt 186 lb 6.4 oz (84.6 kg)   SpO2 98%   BMI 35.22 kg/m   Body mass index is 35.22 kg/m.  GENERAL: vitals reviewed and listed above, alert, oriented, appears well hydrated and in no acute distress stressed  HEENT: atraumatic, conjunctiva  clear, no obvious abnormalities on inspection of external nose and ears  NECK: no obvious masses on inspection palpation  LUNGS: clear to auscultation bilaterally, no wheezes, rales or rhonchi, good air movement CV: HRRR, no clubbing cyanosis or  peripheral edema nl cap refill  Abdomen:  Sof,t normal bowel sounds without hepatosplenomegaly, no guarding rebound or masses no CVA tenderness MS: moves all extremities without noticeable focal  abnormality Skin no petechiae   multiple ecchymosis  purplish arms and legs  none on upper trunck or face .  Large bruise behind r buttocks no hematoma . PSYCH: anxious stressed but  cognitive intact  no tremor Lab Results  Component Value Date   WBC 7.3 01/19/2022   HGB 14.4 01/19/2022   HCT 43.1 01/19/2022   PLT 243.0 01/19/2022   GLUCOSE 104 (H) 02/23/2022   CHOL 253 (H) 08/31/2021   TRIG 99.0 08/31/2021   HDL 59.60 08/31/2021   LDLCALC 174 (H) 08/31/2021   ALT 19 01/19/2022   AST 26 01/19/2022   NA 139 02/23/2022   K 4.4 02/23/2022   CL 102 02/23/2022   CREATININE 0.95 02/23/2022   BUN 23 02/23/2022   CO2 28 02/23/2022   TSH 2.90  01/19/2022   INR 1.0 01/19/2022   HGBA1C 5.9 04/21/2021   BP Readings from Last 3 Encounters:  02/23/22 120/66  01/19/22 118/76  08/31/21 132/76   Lab review   picture review  ASSESSMENT AND PLAN:  Discussed the following assessment and plan:  Easy bruising - Plan: Ambulatory referral to Hematology / Oncology  Major depressive disorder, single episode, moderate ([place]) - transition from pristiq prozac to sertraline  Medication management  Generalized anxiety disorder  Elevated serum creatinine - Plan: Basic Metabolic Panel May be detriment to go off all antidepressants if  a culpril  and vicoprofen control pain .  ? Other causes   bruising is quite extensive and not cw abuse of other intentional trauma.    Plan hematology consult to see opinon about med effect other  options  -Patient advised to return or notify health care team  if  new concerns arise.  Patient Instructions  Updated chemistry today. NOt sure  best course of action  but  yes  ibuprofen and ssris can contribute to bruising.  Hematology input  for now . Continue  monitoring by pictures .      Standley Brooking. Donya Tomaro M.D.

## 2022-02-23 NOTE — Patient Instructions (Signed)
Updated chemistry today. NOt sure  best course of action  but  yes  ibuprofen and ssris can contribute to bruising.  Hematology input  for now . Continue  monitoring by pictures .

## 2022-02-24 LAB — BASIC METABOLIC PANEL
BUN: 23 mg/dL (ref 6–23)
CO2: 28 mEq/L (ref 19–32)
Calcium: 9.8 mg/dL (ref 8.4–10.5)
Chloride: 102 mEq/L (ref 96–112)
Creatinine, Ser: 0.95 mg/dL (ref 0.40–1.20)
GFR: 61.61 mL/min (ref >60.00)
Glucose, Bld: 104 mg/dL — ABNORMAL HIGH (ref 70–99)
Potassium: 4.4 mEq/L (ref 3.5–5.1)
Sodium: 139 mEq/L (ref 135–145)

## 2022-02-24 NOTE — Progress Notes (Signed)
Kidney function is back to normal baseline ,reassuring

## 2022-02-25 ENCOUNTER — Telehealth: Payer: Self-pay | Admitting: Hematology and Oncology

## 2022-02-25 NOTE — Telephone Encounter (Signed)
Scheduled appt per 2/23 referral. Pt is aware of appt date and time. Pt is aware to arrive 15 mins prior to appt time and to bring and updated insurance card. Pt is aware of appt location.   °

## 2022-03-02 ENCOUNTER — Ambulatory Visit: Payer: Medicare Other | Admitting: Internal Medicine

## 2022-03-21 ENCOUNTER — Inpatient Hospital Stay: Payer: Medicare Other

## 2022-03-21 ENCOUNTER — Encounter: Payer: Self-pay | Admitting: Hematology and Oncology

## 2022-03-21 ENCOUNTER — Inpatient Hospital Stay: Payer: Medicare Other | Attending: Hematology and Oncology | Admitting: Hematology and Oncology

## 2022-03-21 VITALS — BP 144/75 | HR 101 | Temp 97.9°F | Resp 18 | Ht 61.0 in | Wt 184.8 lb

## 2022-03-21 DIAGNOSIS — Z8619 Personal history of other infectious and parasitic diseases: Secondary | ICD-10-CM | POA: Diagnosis not present

## 2022-03-21 DIAGNOSIS — G894 Chronic pain syndrome: Secondary | ICD-10-CM

## 2022-03-21 DIAGNOSIS — R233 Spontaneous ecchymoses: Secondary | ICD-10-CM

## 2022-03-21 DIAGNOSIS — G8929 Other chronic pain: Secondary | ICD-10-CM

## 2022-03-21 LAB — CMP (CANCER CENTER ONLY)
ALT: 16 U/L (ref 0–44)
AST: 21 U/L (ref 15–41)
Albumin: 4.4 g/dL (ref 3.5–5.0)
Alkaline Phosphatase: 67 U/L (ref 38–126)
Anion gap: 8 (ref 5–15)
BUN: 30 mg/dL — ABNORMAL HIGH (ref 8–23)
CO2: 26 mmol/L (ref 22–32)
Calcium: 9.7 mg/dL (ref 8.9–10.3)
Chloride: 106 mmol/L (ref 98–111)
Creatinine: 0.97 mg/dL (ref 0.44–1.00)
GFR, Estimated: >60 mL/min (ref >60)
Glucose, Bld: 104 mg/dL — ABNORMAL HIGH (ref 70–99)
Potassium: 4.2 mmol/L (ref 3.5–5.1)
Sodium: 140 mmol/L (ref 135–145)
Total Bilirubin: 0.3 mg/dL (ref 0.3–1.2)
Total Protein: 7.5 g/dL (ref 6.5–8.1)

## 2022-03-21 LAB — CBC WITH DIFFERENTIAL (CANCER CENTER ONLY)
Abs Immature Granulocytes: 0.01 10*3/uL (ref 0.00–0.07)
Basophils Absolute: 0 10*3/uL (ref 0.0–0.1)
Basophils Relative: 1 %
Eosinophils Absolute: 0.1 10*3/uL (ref 0.0–0.5)
Eosinophils Relative: 2 %
HCT: 42.1 % (ref 36.0–46.0)
Hemoglobin: 13.5 g/dL (ref 12.0–15.0)
Immature Granulocytes: 0 %
Lymphocytes Relative: 26 %
Lymphs Abs: 1.3 10*3/uL (ref 0.7–4.0)
MCH: 28.7 pg (ref 26.0–34.0)
MCHC: 32.1 g/dL (ref 30.0–36.0)
MCV: 89.6 fL (ref 80.0–100.0)
Monocytes Absolute: 0.5 10*3/uL (ref 0.1–1.0)
Monocytes Relative: 10 %
Neutro Abs: 3 10*3/uL (ref 1.7–7.7)
Neutrophils Relative %: 61 %
Platelet Count: 192 10*3/uL (ref 150–400)
RBC: 4.7 MIL/uL (ref 3.87–5.11)
RDW: 13.1 % (ref 11.5–15.5)
WBC Count: 4.9 10*3/uL (ref 4.0–10.5)
nRBC: 0 % (ref 0.0–0.2)

## 2022-03-21 LAB — PROTIME-INR
INR: 1 (ref 0.8–1.2)
Prothrombin Time: 12.9 seconds (ref 11.4–15.2)

## 2022-03-21 LAB — LACTATE DEHYDROGENASE: LDH: 160 U/L (ref 98–192)

## 2022-03-21 LAB — FIBRINOGEN: Fibrinogen: 465 mg/dL (ref 210–475)

## 2022-03-21 LAB — SEDIMENTATION RATE: Sed Rate: 18 mm/hr (ref 0–22)

## 2022-03-21 LAB — APTT: aPTT: 29 seconds (ref 24–36)

## 2022-03-21 NOTE — Assessment & Plan Note (Signed)
She had positive ANA screen in 2017 Her recent MRI of the hip from 2023 revealed possible bone marrow infiltration or anemia I will order serum protein electrophoresis for further evaluation along with repeat ANA screen

## 2022-03-21 NOTE — Assessment & Plan Note (Signed)
The most likely cause of easy bruising is related to her chronic use of ibuprofen containing pain medication regimen She is avoiding acetaminophen due to history of liver disease She is also concerned about potential undiagnosed malignancy or autoimmune disorder I will order additional workup I will see her back in 3 weeks for further follow-up

## 2022-03-21 NOTE — Progress Notes (Signed)
[facility] NOTE  Patient Care Team: Panosh, Standley Brooking, MD as PCP - General (Internal Medicine) Chucky May, MD as Consulting Physician (Psychiatry) Chase Caller, PA-C as Physician Assistant (Neurosurgery) Kathrynn Ducking, MD (Inactive) as Consulting Physician (Neurology)  ASSESSMENT & PLAN Easy bruising The most likely cause of easy bruising is related to her chronic use of ibuprofen containing pain medication regimen She is avoiding acetaminophen due to history of liver disease She is also concerned about potential undiagnosed malignancy or autoimmune disorder I will order additional workup I will see her back in 3 weeks for further follow-up  Chronic pain She had positive ANA screen in 2017 Her recent MRI of the hip from 2023 revealed possible bone marrow infiltration or anemia I will order serum protein electrophoresis for further evaluation along with repeat ANA screen  Orders Placed This Encounter  Procedures   ANA, IFA (with reflex)    Standing Status:   Future    Number of Occurrences:   1    Standing Expiration Date:   03/21/2023   CBC with Differential (Cancer Center Only)    Standing Status:   Future    Number of Occurrences:   1    Standing Expiration Date:   03/21/2023   Protime-INR    Standing Status:   Future    Number of Occurrences:   1    Standing Expiration Date:   03/21/2023   APTT    Standing Status:   Future    Number of Occurrences:   1    Standing Expiration Date:   03/21/2023   Fibrinogen    Standing Status:   Future    Number of Occurrences:   1    Standing Expiration Date:   03/21/2023   Von Willebrand panel    Standing Status:   Future    Number of Occurrences:   1    Standing Expiration Date:   03/21/2023   Multiple Myeloma Panel (SPEP&IFE w/QIG)    Standing Status:   Future    Number of Occurrences:   1    Standing Expiration Date:   03/21/2023   Kappa/lambda light chains    Standing Status:    Future    Number of Occurrences:   1    Standing Expiration Date:   03/21/2023   Lactate dehydrogenase    Standing Status:   Future    Number of Occurrences:   1    Standing Expiration Date:   03/21/2023   Sedimentation rate    Standing Status:   Future    Number of Occurrences:   1    Standing Expiration Date:   03/21/2023   CMP ([place] only)    Standing Status:   Future    Number of Occurrences:   1    Standing Expiration Date:   03/21/2023   All questions were answered. The patient knows to call the clinic with any problems, questions or concerns. No barriers to learning was detected. The total time spent in the appointment was 60 minutes encounter with patients including review of chart and various tests results, discussions about plan of care and coordination of care plan  Heath Lark, MD 03/21/2022 12:53 PM  CHIEF COMPLAINTS/PURPOSE OF CONSULTATION:  Easy bruising, concern for malignancy  HISTORY OF PRESENTING ILLNESS:  Toni [name] 67 y.o. female is here because of diffuse ecchymosis/easy bruising on background history of treated hepatitis C. She is also concerned about possible  malignancy due to abnormal MRI reading from 2023 She denies spontaneous epistaxis, hematuria, melena or hematochezia She denies history of excessive menorrhagia The patient denies history of new over-the-counter NSAID or other supplements such as fish oil She denies prior blood or platelet transfusions She is prescribed combination of hydrocodone with ibuprofen for a very long time for chronic degenerative joint disease. She is avoiding acetaminophen due to history of hepatitis C infection  MEDICAL HISTORY:  Past Medical History:  Diagnosis Date   Brachial neuritis or radiculitis NOS    Chronic hepatitis C ([place]) 10/05/2015   treated and rsolved   Depressive disorder, not elsewhere classified    Essential and other specified forms of tremor    Fracture of 5th metatarsal  08/13/2017   Fracture of multiple pubic rami ([place]) 08/13/2017   Headache(784.0)    Hepatitis C infection 12/05/2017   Treated and now resolved and "cured " as of early 2018  And      Memory loss    better see evaluation   Microscopic hematuria    seed dr Gaynelle Arabian in past   Migraine, unspecified, without mention of intractable migraine without mention of status migrainosus    Multiple rib fractures 08/13/2017   Other specified disorders of rotator cuff syndrome of shoulder and allied disorders    Vulvar pain    rx with valium supp    SURGICAL HISTORY: Past Surgical History:  Procedure Laterality Date   BREAST EXCISIONAL BIOPSY Right    No scar   CESAREAN SECTION     x3   SHOULDER ARTHROSCOPY W/ ROTATOR CUFF REPAIR Right 2004   SHOULDER ARTHROSCOPY W/ ROTATOR CUFF REPAIR Left 2008    SOCIAL HISTORY: Social History   Socioeconomic History   Marital status: Divorced    Spouse name: Not on file   Number of children: 3   Years of education: Not on file   Highest education level: Bachelor's degree (e.g., BA, AB, BS)  Occupational History   Not on file  Tobacco Use   Smoking status: Former    Types: Cigarettes    Quit date: 05/23/1978    Years since quitting: 43.8   Smokeless tobacco: Never  Vaping Use   Vaping Use: Never used  Substance and Sexual Activity   Alcohol use: No    Alcohol/week: 0.0 standard drinks of alcohol   Drug use: Yes    Types: Hydrocodone   Sexual activity: Not Currently  Other Topics Concern   Not on file  Social History Narrative   Lives with grandson   Left Handed   Drinks 1-2 cups daily   Social Determinants of Health   Financial Resource Strain: Low Risk  (02/22/2022)   Overall Financial Resource Strain (CARDIA)    Difficulty of Paying Living Expenses: Not very hard  Food Insecurity: No Food Insecurity (02/22/2022)   Hunger Vital Sign    Worried About Running Out of Food in the Last Year: Never true    Ran Out of Food in the Last  Year: Never true  Transportation Needs: No Transportation Needs (02/22/2022)   PRAPARE - Hydrologist (Medical): No    Lack of Transportation (Non-Medical): No  Physical Activity: Inactive (02/22/2022)   Exercise Vital Sign    Days of Exercise per Week: 0 days    Minutes of Exercise per Session: 0 min  Stress: Stress Concern Present (02/22/2022)   [place]    Feeling  of Stress : Very much  Social Connections: Unknown (02/22/2022)   Social Connection and Isolation Panel [NHANES]    Frequency of Communication with Friends and Family: Once a week    Frequency of Social Gatherings with Friends and Family: Patient declined    Attends Religious Services: 1 to 4 times per year    Active Member of [employer] or Organizations: Yes    Attends Music therapist: More than 4 times per year    Marital Status: Divorced  Human resources officer Violence: Not on file    FAMILY HISTORY: Family History  Problem Relation Age of Onset   Headache Mother    Stroke Father        father passed away July 09, 2022   Diabetes Maternal Grandmother    Other Son        Mother is deceased 2011/04/23 father moved to the area 04/22/2012.    ALLERGIES:  is allergic to codeine.  MEDICATIONS:  Current Outpatient Medications  Medication Sig Dispense Refill   BIOTIN PO Take 1 capsule by mouth daily. Biotin hair and nails     Cyanocobalamin ([employer] B12 GUMMIES PO) Take 1 capsule by mouth daily.     desvenlafaxine (PRISTIQ) 50 MG 24 hr tablet Take 50 mg by mouth daily.     Vitamin D, Cholecalciferol, 25 MCG (1000 UT) CAPS Take 1 capsule by mouth daily.     albuterol (VENTOLIN HFA) 108 (90 Base) MCG/ACT inhaler Inhale 2 puffs into the lungs every 6 (six) hours as needed for wheezing or shortness of breath. 1 each 1   Alum Hydroxide-Mag Trisilicate [id number] MG CHEW Chew by mouth. CHEW 2 TABLETS BY MOUTH BEFORE EACH MEAL AT BEDTIME AS NEEDED FOR  INDIGESTION OR HEARTBURN     amLODipine (NORVASC) 2.5 MG tablet TAKE ONE TABLET BY MOUTH DAILY 90 tablet 1   cetirizine (ZYRTEC) 10 MG tablet Take 10 mg by mouth daily as needed for allergies.     clonazePAM (KLONOPIN) 0.5 MG tablet Take by mouth. (Patient not taking: Reported on 02/23/2022)     dextroamphetamine (DEXEDRINE SPANSULE) 15 MG 24 hr capsule Take 30-45 mg by mouth daily. 2 po q am and 1 po q noon 01/27/22: weaning it down to 1 a day.     fluticasone (FLONASE) 50 MCG/ACT nasal spray Place 1 spray into both nostrils daily. 16 g 5   gabapentin (NEURONTIN) 600 MG tablet Take 600 mg by mouth. PO TID FOR HEAD PAIN     HYDROcodone-ibuprofen (VICOPROFEN) 7.5-200 MG tablet every 6 (six) hours as needed.      ketoconazole (NIZORAL) 2 % cream APPLY TO AFFECTED AREA TWICE A DAY 30 g 2   levothyroxine (SYNTHROID) 50 MCG tablet Take 1 tablet (50 mcg total) by mouth daily. 90 tablet 3   Misc Natural Products (ESTROVEN + ENERGY MAX STRENGTH) TABS Take by mouth daily.     Multiple Vitamins-Minerals (CENTRUM SILVER ADULT 50+) TABS Take 1 tablet by mouth daily.     SUMAtriptan (IMITREX) 25 MG tablet Take 1 tablet (25 mg total) by mouth as needed for migraine. May repeat in 2 hours if headache persists or recurs. 10 tablet 11   SUMAtriptan 6 MG/0.5ML SOAJ Use 1 syringe for migraines upon awakening.  Do not use with Imitrex tablets. 0.5 mL 6   No current facility-administered medications for this visit.    REVIEW OF SYSTEMS:   Constitutional: Denies fevers, chills or abnormal night sweats Eyes: Denies blurriness of vision, double vision or watery  eyes Ears, nose, mouth, throat, and face: Denies mucositis or sore throat Respiratory: Denies cough, dyspnea or wheezes Cardiovascular: Denies palpitation, chest discomfort or lower extremity swelling Gastrointestinal:  Denies nausea, heartburn or change in bowel habits Skin: Denies abnormal skin rashes Lymphatics: Denies new lymphadenopathy   Neurological:Denies numbness, tingling or new weaknesses Behavioral/Psych: Mood is stable, no new changes  All other systems were reviewed with the patient and are negative.  PHYSICAL EXAMINATION: ECOG PERFORMANCE STATUS: 1 - Symptomatic but completely ambulatory  Vitals:   03/21/22 1207  BP: (!) 144/75  Pulse: (!) 101  Resp: 18  Temp: 97.9 F (36.6 C)  SpO2: 98%   Filed Weights   03/21/22 1207  Weight: 184 lb 12.8 oz (83.8 kg)    GENERAL:alert, no distress and comfortable SKIN: She has signs of ecchymosis but no petechiae EYES: normal, conjunctiva are pink and non-injected, sclera clear OROPHARYNX:no exudate, no erythema and lips, buccal mucosa, and tongue normal  NECK: supple, thyroid normal size, non-tender, without nodularity LYMPH:  no palpable lymphadenopathy in the cervical, axillary or inguinal LUNGS: clear to auscultation and percussion with normal breathing effort HEART: regular rate & rhythm and no murmurs and no lower extremity edema ABDOMEN:abdomen soft, non-tender and normal bowel sounds Musculoskeletal:no cyanosis of digits and no clubbing  PSYCH: alert & oriented x 3 with fluent speech NEURO: no focal motor/sensory deficits  LABORATORY DATA:  I have reviewed the data as listed Lab Results  Component Value Date   WBC 7.3 01/19/2022   HGB 14.4 01/19/2022   HCT 43.1 01/19/2022   MCV 89.2 01/19/2022   PLT 243.0 01/19/2022     RADIOGRAPHIC STUDIES: I have personally reviewed her MRI imaging from 2023 I have personally reviewed the radiological images as listed and agreed with the findings in the report.

## 2022-03-22 LAB — VON WILLEBRAND PANEL
Coagulation Factor VIII: 252 % — ABNORMAL HIGH (ref 56–140)
Ristocetin Co-factor, Plasma: 173 % (ref 50–200)
Von Willebrand Antigen, Plasma: 280 % — ABNORMAL HIGH (ref 50–200)

## 2022-03-22 LAB — KAPPA/LAMBDA LIGHT CHAINS
Kappa free light chain: 24.2 mg/L — ABNORMAL HIGH (ref 3.3–19.4)
Kappa, lambda light chain ratio: 1.42 (ref 0.26–1.65)
Lambda free light chains: 17.1 mg/L (ref 5.7–26.3)

## 2022-03-22 LAB — COAG STUDIES INTERP REPORT

## 2022-03-23 LAB — ANTINUCLEAR ANTIBODIES, IFA: ANA Ab, IFA: NEGATIVE

## 2022-03-25 LAB — MULTIPLE MYELOMA PANEL, SERUM
Albumin SerPl Elph-Mcnc: 4 g/dL (ref 2.9–4.4)
Albumin/Glob SerPl: 1.3 (ref 0.7–1.7)
Alpha 1: 0.3 g/dL (ref 0.0–0.4)
Alpha2 Glob SerPl Elph-Mcnc: 0.9 g/dL (ref 0.4–1.0)
B-Globulin SerPl Elph-Mcnc: 1.1 g/dL (ref 0.7–1.3)
Gamma Glob SerPl Elph-Mcnc: 1 g/dL (ref 0.4–1.8)
Globulin, Total: 3.3 g/dL (ref 2.2–3.9)
IgA: 117 mg/dL (ref 87–352)
IgG (Immunoglobin G), Serum: 952 mg/dL (ref 586–1602)
IgM (Immunoglobulin M), Srm: 130 mg/dL (ref 26–217)
Total Protein ELP: 7.3 g/dL (ref 6.0–8.5)

## 2022-03-28 ENCOUNTER — Telehealth: Payer: Self-pay | Admitting: Internal Medicine

## 2022-03-28 NOTE — Telephone Encounter (Signed)
Spoke to pt. Reschedule her VV appt on 03/31/2022 to tomorrow 8:30am with Dr. Regis Bill.

## 2022-03-28 NOTE — Telephone Encounter (Signed)
Pt called back and asks to hold off on ortho referral until she sees the provider. Asking for a call.

## 2022-03-28 NOTE — Telephone Encounter (Signed)
Pt is scheduled for VV on Thursday,  03/31/22.  Pt states she may not be able to wait that long, as she is in extreme pain and needs to see an Ortho ASAP!!  Pt is asking if MD could please send a referral?

## 2022-03-29 ENCOUNTER — Encounter: Payer: Medicare Other | Admitting: Internal Medicine

## 2022-03-29 DIAGNOSIS — Z91199 Patient's noncompliance with other medical treatment and regimen due to unspecified reason: Secondary | ICD-10-CM

## 2022-03-29 NOTE — Progress Notes (Unsigned)
Virtual Visit via Video Note  I connected with Toni [name] on 03/29/22 at  8:30 AM EDT by a video enabled telemedicine application and verified that I am speaking with the correct person using two identifiers. Location patient: home Location provider:work office Persons participating in the virtual visit: patient, provider  Patient aware  of the limitations of evaluation and management by telemedicine and  availability of in person appointments. and agreed to proceed.   HPI: Toni [name] presents for video visit    ROS: See pertinent positives and negatives per HPI.  Past Medical History:  Diagnosis Date   Brachial neuritis or radiculitis NOS    Chronic hepatitis C ([place]) 10/05/2015   treated and rsolved   Depressive disorder, not elsewhere classified    Essential and other specified forms of tremor    Fracture of 5th metatarsal 08/13/2017   Fracture of multiple pubic rami ([place]) 08/13/2017   Headache(784.0)    Hepatitis C infection 12/05/2017   Treated and now resolved and "cured " as of early 04-12-16  And      Memory loss    better see evaluation   Microscopic hematuria    seed dr Gaynelle Arabian in past   Migraine, unspecified, without mention of intractable migraine without mention of status migrainosus    Multiple rib fractures 08/13/2017   Other specified disorders of rotator cuff syndrome of shoulder and allied disorders    Vulvar pain    rx with valium supp    Past Surgical History:  Procedure Laterality Date   BREAST EXCISIONAL BIOPSY Right    No scar   CESAREAN SECTION     x3   SHOULDER ARTHROSCOPY W/ ROTATOR CUFF REPAIR Right April 13, 2002   SHOULDER ARTHROSCOPY W/ ROTATOR CUFF REPAIR Left 04/13/06    Family History  Problem Relation Age of Onset   Headache Mother    Stroke Father        father passed away 06-30-22   Diabetes Maternal Grandmother    Other Son        Mother is deceased 13-Apr-2011 father moved to the area 04-12-12.    Social History    Tobacco Use   Smoking status: Former    Types: Cigarettes    Quit date: 05/23/1978    Years since quitting: 43.8   Smokeless tobacco: Never  Vaping Use   Vaping Use: Never used  Substance Use Topics   Alcohol use: No    Alcohol/week: 0.0 standard drinks of alcohol   Drug use: Yes    Types: Hydrocodone      Current Outpatient Medications:    albuterol (VENTOLIN HFA) 108 (90 Base) MCG/ACT inhaler, Inhale 2 puffs into the lungs every 6 (six) hours as needed for wheezing or shortness of breath., Disp: 1 each, Rfl: 1   Alum Hydroxide-Mag Trisilicate A999333 MG CHEW, Chew by mouth. CHEW 2 TABLETS BY MOUTH BEFORE EACH MEAL AT BEDTIME AS NEEDED FOR INDIGESTION OR HEARTBURN, Disp: , Rfl:    amLODipine (NORVASC) 2.5 MG tablet, TAKE ONE TABLET BY MOUTH DAILY, Disp: 90 tablet, Rfl: 1   BIOTIN PO, Take 1 capsule by mouth daily. Biotin hair and nails, Disp: , Rfl:    cetirizine (ZYRTEC) 10 MG tablet, Take 10 mg by mouth daily as needed for allergies., Disp: , Rfl:    clonazePAM (KLONOPIN) 0.5 MG tablet, Take by mouth. (Patient not taking: Reported on 02/23/2022), Disp: , Rfl:    Cyanocobalamin ([employer] B12 GUMMIES PO), Take 1 capsule by mouth  daily., Disp: , Rfl:    desvenlafaxine (PRISTIQ) 50 MG 24 hr tablet, Take 50 mg by mouth daily., Disp: , Rfl:    dextroamphetamine (DEXEDRINE SPANSULE) 15 MG 24 hr capsule, Take 30-45 mg by mouth daily. 2 po q am and 1 po q noon 01/27/22: weaning it down to 1 a day., Disp: , Rfl:    fluticasone (FLONASE) 50 MCG/ACT nasal spray, Place 1 spray into both nostrils daily., Disp: 16 g, Rfl: 5   gabapentin (NEURONTIN) 600 MG tablet, Take 600 mg by mouth. PO TID FOR HEAD PAIN, Disp: , Rfl:    HYDROcodone-ibuprofen (VICOPROFEN) 7.5-200 MG tablet, every 6 (six) hours as needed. , Disp: , Rfl:    ketoconazole (NIZORAL) 2 % cream, APPLY TO AFFECTED AREA TWICE A DAY, Disp: 30 g, Rfl: 2   levothyroxine (SYNTHROID) 50 MCG tablet, Take 1 tablet (50 mcg total) by mouth daily.,  Disp: 90 tablet, Rfl: 3   Misc Natural Products (ESTROVEN + ENERGY MAX STRENGTH) TABS, Take by mouth daily., Disp: , Rfl:    Multiple Vitamins-Minerals (CENTRUM SILVER ADULT 50+) TABS, Take 1 tablet by mouth daily., Disp: , Rfl:    SUMAtriptan (IMITREX) 25 MG tablet, Take 1 tablet (25 mg total) by mouth as needed for migraine. May repeat in 2 hours if headache persists or recurs., Disp: 10 tablet, Rfl: 11   SUMAtriptan 6 MG/0.5ML SOAJ, Use 1 syringe for migraines upon awakening.  Do not use with Imitrex tablets., Disp: 0.5 mL, Rfl: 6   Vitamin D, Cholecalciferol, 25 MCG (1000 UT) CAPS, Take 1 capsule by mouth daily., Disp: , Rfl:   EXAM: BP Readings from Last 3 Encounters:  03/21/22 (!) 144/75  02/23/22 120/66  01/19/22 118/76    VITALS per patient if applicable:  GENERAL: alert, oriented, appears well and in no acute distress  HEENT: atraumatic, conjunttiva clear, no obvious abnormalities on inspection of external nose and ears  NECK: normal movements of the head and neck  LUNGS: on inspection no signs of respiratory distress, breathing rate appears normal, no obvious gross SOB, gasping or wheezing  CV: no obvious cyanosis  MS: moves all visible extremities without noticeable abnormality  PSYCH/NEURO: pleasant and cooperative, no obvious depression or anxiety, speech and thought processing grossly intact Lab Results  Component Value Date   WBC 4.9 03/21/2022   HGB 13.5 03/21/2022   HCT 42.1 03/21/2022   PLT 192 03/21/2022   GLUCOSE 104 (H) 03/21/2022   CHOL 253 (H) 08/31/2021   TRIG 99.0 08/31/2021   HDL 59.60 08/31/2021   LDLCALC 174 (H) 08/31/2021   ALT 16 03/21/2022   AST 21 03/21/2022   NA 140 03/21/2022   K 4.2 03/21/2022   CL 106 03/21/2022   CREATININE 0.97 03/21/2022   BUN 30 (H) 03/21/2022   CO2 26 03/21/2022   TSH 2.90 01/19/2022   INR 1.0 03/21/2022   HGBA1C 5.9 04/21/2021    ASSESSMENT AND PLAN:  Discussed the following assessment and plan:  No  diagnosis found.  Counseled.   Expectant management and discussion of plan and treatment with opportunity to ask questions and all were answered. The patient agreed with the plan and demonstrated an understanding of the instructions.   Advised to call back or seek an in-person evaluation if worsening  or having  further concerns  in interim. No follow-ups on file.    Shanon Ace, MD

## 2022-03-31 ENCOUNTER — Telehealth: Payer: Medicare Other | Admitting: Internal Medicine

## 2022-03-31 ENCOUNTER — Ambulatory Visit: Payer: Medicare Other | Admitting: Internal Medicine

## 2022-04-01 ENCOUNTER — Other Ambulatory Visit: Payer: Self-pay | Admitting: Internal Medicine

## 2022-04-05 ENCOUNTER — Telehealth: Payer: Medicare Other | Admitting: Internal Medicine

## 2022-04-11 ENCOUNTER — Inpatient Hospital Stay: Payer: Medicare Other | Attending: Hematology and Oncology | Admitting: Hematology and Oncology

## 2022-04-11 ENCOUNTER — Encounter: Payer: Self-pay | Admitting: Hematology and Oncology

## 2022-04-11 VITALS — BP 133/80 | HR 100 | Temp 98.5°F | Resp 18 | Ht 61.0 in | Wt 185.4 lb

## 2022-04-11 DIAGNOSIS — G8929 Other chronic pain: Secondary | ICD-10-CM | POA: Diagnosis not present

## 2022-04-11 DIAGNOSIS — R238 Other skin changes: Secondary | ICD-10-CM | POA: Insufficient documentation

## 2022-04-11 DIAGNOSIS — R233 Spontaneous ecchymoses: Secondary | ICD-10-CM | POA: Diagnosis not present

## 2022-04-11 DIAGNOSIS — G894 Chronic pain syndrome: Secondary | ICD-10-CM | POA: Diagnosis not present

## 2022-04-11 DIAGNOSIS — M2550 Pain in unspecified joint: Secondary | ICD-10-CM | POA: Diagnosis not present

## 2022-04-11 NOTE — Assessment & Plan Note (Signed)
Autoimmune screen, sedimentation rate and myeloma panel were all negative. I would defer to her orthopedic physician for management

## 2022-04-11 NOTE — Assessment & Plan Note (Signed)
I reviewed all test results with the patient Screening for von Willebrand disease, coagulopathy and CBC were within normal range The most likely cause of her easy bruising is the combination pain medicine with ibuprofen that she has been taking for chronic pain She does not need long-term follow-up I recommend the patient to discuss pain management with her treating physician to consider stopping the ibuprofen and substituting with acetaminophen

## 2022-04-11 NOTE — Progress Notes (Signed)
[facility] OFFICE PROGRESS NOTE  Panosh, Neta Mends, MD  ASSESSMENT & PLAN:  Easy bruising I reviewed all test results with the patient Screening for von Willebrand disease, coagulopathy and CBC were within normal range The most likely cause of her easy bruising is the combination pain medicine with ibuprofen that she has been taking for chronic pain She does not need long-term follow-up I recommend the patient to discuss pain management with her treating physician to consider stopping the ibuprofen and substituting with acetaminophen  Chronic pain Autoimmune screen, sedimentation rate and myeloma panel were all negative. I would defer to her orthopedic physician for management  No orders of the defined types were placed in this encounter.   The total time spent in the appointment was 20 minutes encounter with patients including review of chart and various tests results, discussions about plan of care and coordination of care plan   All questions were answered. The patient knows to call the clinic with any problems, questions or concerns. No barriers to learning was detected.    Artis Delay, MD 4/8/20243:18 PM  INTERVAL HISTORY: Toni [name] 67 y.o. female returns for review of test results She continues to complain of significant joint pain.  She has persistent bruising in her lower extremities We spent majority of our time reviewing test results  SUMMARY OF HEMATOLOGIC HISTORY:  Toni [name] 67 y.o. female is here because of diffuse ecchymosis/easy bruising on background history of treated hepatitis C. She is also concerned about possible malignancy due to abnormal MRI reading from 2023 She denies spontaneous epistaxis, hematuria, melena or hematochezia She denies history of excessive menorrhagia The patient denies history of new over-the-counter NSAID or other supplements such as fish oil She denies prior blood or platelet  transfusions She is prescribed combination of hydrocodone with ibuprofen for a very long time for chronic degenerative joint disease. She is avoiding acetaminophen due to history of hepatitis C infection Extensive workup including multiple blood test in March 2024 came back negative for definitive cause of bruising.  I have reviewed the past medical history, past surgical history, social history and family history with the patient and they are unchanged from previous note.  ALLERGIES:  is allergic to codeine.  MEDICATIONS:  Current Outpatient Medications  Medication Sig Dispense Refill   albuterol (VENTOLIN HFA) 108 (90 Base) MCG/ACT inhaler Inhale 2 puffs into the lungs every 6 (six) hours as needed for wheezing or shortness of breath. 1 each 1   Alum Hydroxide-Mag Trisilicate 80-14.2 MG CHEW Chew by mouth. CHEW 2 TABLETS BY MOUTH BEFORE EACH MEAL AT BEDTIME AS NEEDED FOR INDIGESTION OR HEARTBURN     amLODipine (NORVASC) 2.5 MG tablet TAKE 1 TABLET BY MOUTH DAILY 90 tablet 1   BIOTIN PO Take 1 capsule by mouth daily. Biotin hair and nails     cetirizine (ZYRTEC) 10 MG tablet Take 10 mg by mouth daily as needed for allergies.     clonazePAM (KLONOPIN) 0.5 MG tablet Take by mouth. (Patient not taking: Reported on 02/23/2022)     Cyanocobalamin ([employer] B12 GUMMIES PO) Take 1 capsule by mouth daily.     desvenlafaxine (PRISTIQ) 50 MG 24 hr tablet Take 50 mg by mouth daily.     dextroamphetamine (DEXEDRINE SPANSULE) 15 MG 24 hr capsule Take 30-45 mg by mouth daily. 2 po q am and 1 po q noon 01/27/22: weaning it down to 1 a day.     fluticasone (FLONASE) 50 MCG/ACT  nasal spray Place 1 spray into both nostrils daily. 16 g 5   gabapentin (NEURONTIN) 600 MG tablet Take 600 mg by mouth. PO TID FOR HEAD PAIN     HYDROcodone-ibuprofen (VICOPROFEN) 7.5-200 MG tablet every 6 (six) hours as needed.      ketoconazole (NIZORAL) 2 % cream APPLY TO AFFECTED AREA TWICE A DAY 30 g 2   levothyroxine (SYNTHROID) 50  MCG tablet Take 1 tablet (50 mcg total) by mouth daily. 90 tablet 3   Misc Natural Products (ESTROVEN + ENERGY MAX STRENGTH) TABS Take by mouth daily.     Multiple Vitamins-Minerals (CENTRUM SILVER ADULT 50+) TABS Take 1 tablet by mouth daily.     SUMAtriptan (IMITREX) 25 MG tablet Take 1 tablet (25 mg total) by mouth as needed for migraine. May repeat in 2 hours if headache persists or recurs. 10 tablet 11   SUMAtriptan 6 MG/0.5ML SOAJ Use 1 syringe for migraines upon awakening.  Do not use with Imitrex tablets. 0.5 mL 6   Vitamin D, Cholecalciferol, 25 MCG (1000 UT) CAPS Take 1 capsule by mouth daily.     No current facility-administered medications for this visit.     REVIEW OF SYSTEMS:   Constitutional: Denies fevers, chills or night sweats Eyes: Denies blurriness of vision Ears, nose, mouth, throat, and face: Denies mucositis or sore throat Respiratory: Denies cough, dyspnea or wheezes Cardiovascular: Denies palpitation, chest discomfort or lower extremity swelling Gastrointestinal:  Denies nausea, heartburn or change in bowel habits Skin: Denies abnormal skin rashes Lymphatics: Denies new lymphadenopathy or easy bruising Neurological:Denies numbness, tingling or new weaknesses Behavioral/Psych: Mood is stable, no new changes  All other systems were reviewed with the patient and are negative.  PHYSICAL EXAMINATION: ECOG PERFORMANCE STATUS: 1 - Symptomatic but completely ambulatory  Vitals:   04/11/22 1441  BP: 133/80  Pulse: 100  Resp: 18  Temp: 98.5 F (36.9 C)  SpO2: 99%   Filed Weights   04/11/22 1441  Weight: 185 lb 6.4 oz (84.1 kg)    GENERAL:alert, no distress and comfortable  NEURO: alert & oriented x 3 with fluent speech, no focal motor/sensory deficits  LABORATORY DATA:  I have reviewed the data as listed     Component Value Date/Time   NA 140 03/21/2022 1232   NA 144 04/22/2019 1200   K 4.2 03/21/2022 1232   CL 106 03/21/2022 1232   CO2 26  03/21/2022 1232   GLUCOSE 104 (H) 03/21/2022 1232   BUN 30 (H) 03/21/2022 1232   BUN 33 (H) 04/22/2019 1200   CREATININE 0.97 03/21/2022 1232   CALCIUM 9.7 03/21/2022 1232   PROT 7.5 03/21/2022 1232   PROT 7.5 04/22/2019 1200   ALBUMIN 4.4 03/21/2022 1232   ALBUMIN 4.9 (H) 04/22/2019 1200   AST 21 03/21/2022 1232   ALT 16 03/21/2022 1232   ALKPHOS 67 03/21/2022 1232   BILITOT 0.3 03/21/2022 1232   GFRNONAA >60 03/21/2022 1232   GFRAA 76 04/22/2019 1200    No results found for: "SPEP", "UPEP"  Lab Results  Component Value Date   WBC 4.9 03/21/2022   NEUTROABS 3.0 03/21/2022   HGB 13.5 03/21/2022   HCT 42.1 03/21/2022   MCV 89.6 03/21/2022   PLT 192 03/21/2022      Chemistry      Component Value Date/Time   NA 140 03/21/2022 1232   NA 144 04/22/2019 1200   K 4.2 03/21/2022 1232   CL 106 03/21/2022 1232   CO2 26 03/21/2022  1232   BUN 30 (H) 03/21/2022 1232   BUN 33 (H) 04/22/2019 1200   CREATININE 0.97 03/21/2022 1232   GLU 89 08/14/2017 0000      Component Value Date/Time   CALCIUM 9.7 03/21/2022 1232   ALKPHOS 67 03/21/2022 1232   AST 21 03/21/2022 1232   ALT 16 03/21/2022 1232   BILITOT 0.3 03/21/2022 1232

## 2022-04-15 ENCOUNTER — Telehealth: Payer: Self-pay | Admitting: Internal Medicine

## 2022-04-15 NOTE — Telephone Encounter (Signed)
Pt is requesting to have lab work ordered for a rheumatology referral. She provided a list of labs needed given to her by Dr. Darrelyn Hillock at [facility]. Labs that she is requesting are:  Comprehensive metabolic panel ANA screen, ifa w/refl titer and pattern Reheumatoid factor Sed rate by modified westergren TSH Uric acid Hla-B27 DNA typing Hla-B27 antigen Cyclic citrullinated peptide CBC Urinalysis Angiotensin - 1-coverting enzyme Creatine kinase isoenzyme panel  Pt does state she has had some recent lab work done at the cancer center and wasn't sure if she would need repeated lab work done for the referral.   She also would like to know if Dr. Fabian Sharp would need to see her before making the referral to rheumatology.

## 2022-04-18 ENCOUNTER — Ambulatory Visit: Payer: Medicare Other | Admitting: Neurology

## 2022-04-18 NOTE — Telephone Encounter (Signed)
Looks like had some of these labs done  via  hematology.  I would need a visit ( virtual ok before ordering more blood work) Seems like dr Ciro Backer is looking for  autoimmune  inflammatory  conditions  that can be identified ,.

## 2022-04-19 NOTE — Telephone Encounter (Addendum)
Pt was scheduled for an OV on 04/27/22.  Pt was offered sooner OVs, as well as VVs, but had a lot going on with her schedule  (Professional/Personal)

## 2022-04-20 ENCOUNTER — Telehealth: Payer: Self-pay | Admitting: Internal Medicine

## 2022-04-20 NOTE — Telephone Encounter (Signed)
Blood work for rheumatologist was ordered by ortho. MRI was concerning to sara at [facility] office and she is setting up a second opinion. Tears shown in MRI. Requesting provider review results and requesting a call.

## 2022-04-27 ENCOUNTER — Ambulatory Visit (INDEPENDENT_AMBULATORY_CARE_PROVIDER_SITE_OTHER): Payer: Medicare Other | Admitting: Internal Medicine

## 2022-04-27 ENCOUNTER — Encounter: Payer: Self-pay | Admitting: Internal Medicine

## 2022-04-27 VITALS — BP 138/72 | HR 85 | Temp 97.6°F | Ht 61.0 in | Wt 185.0 lb

## 2022-04-27 DIAGNOSIS — M25552 Pain in left hip: Secondary | ICD-10-CM | POA: Diagnosis not present

## 2022-04-27 NOTE — Patient Instructions (Signed)
I agree  not gogng to rheumatology at this time, but more opinion about  hip  and knee as discussed.

## 2022-04-27 NOTE — Progress Notes (Signed)
Chief Complaint  Patient presents with   Follow-up    Pt is follow up on MRI done with [facility]. Not sure if needed a referral rheumatology.     HPI: Toni [name] 67 y.o. come in for  fu  evaluation of severe left groin knee pain   on going.  Saw dr Reece Agar who ordered a panel of blood work checking for evidence of  inflammatory disease , also ordered an mri arthro of  hip  Saw heme and no underlying disease.   From bruising  Hx of pelvic fracture remote   see past  Has been dealing with hip pain form months  saw dr Dion Saucier next  Knee x ray and then .   Pain management  from ns office .  Saw dr Mayra Reel  this am  to have knee mri. This week.   Fu next dr Sena Slate and to go on plane trip.  Having hard tome t raise  foot and put on shoe.   Not sure needs to see rheum at this time  ROS: See pertinent positives and negatives per HPI.  Past Medical History:  Diagnosis Date   Brachial neuritis or radiculitis NOS    Chronic hepatitis C (HCC) 10/05/2015   treated and rsolved   Depressive disorder, not elsewhere classified    Essential and other specified forms of tremor    Fracture of 5th metatarsal 08/13/2017   Fracture of multiple pubic rami (HCC) 08/13/2017   Headache(784.0)    Hepatitis C infection 12/05/2017   Treated and now resolved and "cured " as of early 05/19/16  And      Memory loss    better see evaluation   Microscopic hematuria    seed dr Patsi Sears in past   Migraine, unspecified, without mention of intractable migraine without mention of status migrainosus    Multiple rib fractures 08/13/2017   Other specified disorders of rotator cuff syndrome of shoulder and allied disorders    Vulvar pain    rx with valium supp    Family History  Problem Relation Age of Onset   Headache Mother    Stroke Father        father passed away june17   Diabetes Maternal Grandmother    Other Son        Mother is deceased 20-May-2011 father moved to the area 19-May-2012.    Social  History   Socioeconomic History   Marital status: Divorced    Spouse name: Not on file   Number of children: 3   Years of education: Not on file   Highest education level: Bachelor's degree (e.g., BA, AB, BS)  Occupational History   Not on file  Tobacco Use   Smoking status: Former    Types: Cigarettes    Quit date: 05/23/1978    Years since quitting: 43.9   Smokeless tobacco: Never  Vaping Use   Vaping Use: Never used  Substance and Sexual Activity   Alcohol use: No    Alcohol/week: 0.0 standard drinks of alcohol   Drug use: Yes    Types: Hydrocodone   Sexual activity: Not Currently  Other Topics Concern   Not on file  Social History Narrative   Lives with grandson   Left Handed   Drinks 1-2 cups daily   Social Determinants of Health   Financial Resource Strain: Low Risk  (02/22/2022)   Overall Financial Resource Strain (CARDIA)    Difficulty of Paying Living Expenses: Not very  hard  Food Insecurity: No Food Insecurity (02/22/2022)   Hunger Vital Sign    Worried About Running Out of Food in the Last Year: Never true    Ran Out of Food in the Last Year: Never true  Transportation Needs: No Transportation Needs (02/22/2022)   PRAPARE - Administrator, Civil Service (Medical): No    Lack of Transportation (Non-Medical): No  Physical Activity: Inactive (02/22/2022)   Exercise Vital Sign    Days of Exercise per Week: 0 days    Minutes of Exercise per Session: 0 min  Stress: Stress Concern Present (02/22/2022)   [employer] of Occupational Health - Occupational Stress Questionnaire    Feeling of Stress : Very much  Social Connections: Unknown (02/22/2022)   Social Connection and Isolation Panel [NHANES]    Frequency of Communication with Friends and Family: Once a week    Frequency of Social Gatherings with Friends and Family: Patient declined    Attends Religious Services: 1 to 4 times per year    Active Member of [employer] or Organizations: Yes    Attends  Engineer, structural: More than 4 times per year    Marital Status: Divorced    Outpatient Medications Prior to Visit  Medication Sig Dispense Refill   albuterol (VENTOLIN HFA) 108 (90 Base) MCG/ACT inhaler Inhale 2 puffs into the lungs every 6 (six) hours as needed for wheezing or shortness of breath. 1 each 1   Alum Hydroxide-Mag Trisilicate 80-14.2 MG CHEW Chew by mouth. CHEW 2 TABLETS BY MOUTH BEFORE EACH MEAL AT BEDTIME AS NEEDED FOR INDIGESTION OR HEARTBURN     amLODipine (NORVASC) 2.5 MG tablet TAKE 1 TABLET BY MOUTH DAILY 90 tablet 1   BIOTIN PO Take 1 capsule by mouth daily. Biotin hair and nails     cetirizine (ZYRTEC) 10 MG tablet Take 10 mg by mouth daily as needed for allergies.     clonazePAM (KLONOPIN) 0.5 MG tablet Take by mouth.     Cyanocobalamin ([employer]S B12 GUMMIES PO) Take 1 capsule by mouth daily.     desvenlafaxine (PRISTIQ) 50 MG 24 hr tablet Take 50 mg by mouth daily.     dextroamphetamine (DEXEDRINE SPANSULE) 15 MG 24 hr capsule Take 30 mg by mouth daily. 2 po q am and 1 po q noon 01/27/22: weaning it down to 1 a day.     fluticasone (FLONASE) 50 MCG/ACT nasal spray Place 1 spray into both nostrils daily. 16 g 5   gabapentin (NEURONTIN) 600 MG tablet Take 600 mg by mouth. PO TID FOR HEAD PAIN     HYDROcodone-ibuprofen (VICOPROFEN) 7.5-200 MG tablet every 6 (six) hours as needed.      ketoconazole (NIZORAL) 2 % cream APPLY TO AFFECTED AREA TWICE A DAY 30 g 2   levothyroxine (SYNTHROID) 50 MCG tablet Take 1 tablet (50 mcg total) by mouth daily. 90 tablet 3   Misc Natural Products (ESTROVEN + ENERGY MAX STRENGTH) TABS Take by mouth daily.     Multiple Vitamins-Minerals (CENTRUM SILVER ADULT 50+) TABS Take 1 tablet by mouth daily.     SUMAtriptan (IMITREX) 25 MG tablet Take 1 tablet (25 mg total) by mouth as needed for migraine. May repeat in 2 hours if headache persists or recurs. 10 tablet 11   SUMAtriptan 6 MG/0.5ML SOAJ Use 1 syringe for migraines upon awakening.   Do not use with Imitrex tablets. 0.5 mL 6   Vitamin D, Cholecalciferol, 25 MCG (1000 UT) CAPS Take  1 capsule by mouth daily.     No facility-administered medications prior to visit.     EXAM:  BP 138/72 (BP Location: Left Arm, Patient Position: Sitting, Cuff Size: Large)   Pulse 85   Temp 97.6 F (36.4 C) (Oral)   Ht 5\' 1"  (1.549 m)   Wt 185 lb (83.9 kg)   SpO2 93%   BMI 34.96 kg/m   Body mass index is 34.96 kg/m.  GENERAL: vitals reviewed and listed above, alert, oriented, appears well hydrated and in no acute distress limps  HEENT: atraumatic, conjunctiva  clear, no obvious abnormalities on inspection of external nose and ears  NECK: no obvious masses on inspection palpation  MS: moves all extremities without noticeable focal  abnormality limping  independent  ambulation PSYCH: pleasant and cooperative, baseline  Lab Results  Component Value Date   WBC 4.9 03/21/2022   HGB 13.5 03/21/2022   HCT 42.1 03/21/2022   PLT 192 03/21/2022   GLUCOSE 104 (H) 03/21/2022   CHOL 253 (H) 08/31/2021   TRIG 99.0 08/31/2021   HDL 59.60 08/31/2021   LDLCALC 174 (H) 08/31/2021   ALT 16 03/21/2022   AST 21 03/21/2022   NA 140 03/21/2022   K 4.2 03/21/2022   CL 106 03/21/2022   CREATININE 0.97 03/21/2022   BUN 30 (H) 03/21/2022   CO2 26 03/21/2022   TSH 2.90 01/19/2022   INR 1.0 03/21/2022   HGBA1C 5.9 04/21/2021   BP Readings from Last 3 Encounters:  04/27/22 138/72  04/11/22 133/80  03/21/22 (!) 144/75   4/9 24 [facility] hip arthrogram  cam deformity mod djd degenerative tearing of labrum and partial tea of leg teres and mod severe tear  gluteous minmus  and left hamstring at origin  Report to be sent to scan ASSESSMENT AND PLAN:  Discussed the following assessment and plan:  Left hip pain - arthritis  and  labral and soft tissue tears. Sig degenerative  changes  hips soft tissues and mod djd area   Lab results reviewed   basically normal results  or insig  findings  Agree with ortho fu and knee condition may be adding to gait imbalance .   At this time do not see help with rheum  referral so put that on hold.  Counseled. And have ortho fu.  32 minutes review and counsel .  -Patient advised to return or notify health care team  if  new concerns arise.  Patient Instructions  I agree  not gogng to rheumatology at this time, but more opinion about  hip  and knee as discussed.     Neta Mends. Kamyah Wilhelmsen M.D.

## 2022-05-13 ENCOUNTER — Telehealth: Payer: Self-pay | Admitting: Internal Medicine

## 2022-05-13 NOTE — Telephone Encounter (Signed)
Pt is calling to let md know she has been referral to [facility] for hip and knee pain. Pt had mri on knee and has multiple tears

## 2022-05-18 ENCOUNTER — Ambulatory Visit: Payer: Medicare Other | Admitting: Internal Medicine

## 2022-05-25 NOTE — Telephone Encounter (Signed)
So I dont see  the knee MRI  in the EHR yet but may eventually come later.  Sorry you have both knee and hip to deal with . Glad  you are getting multiple opinions. There is hope for improvement

## 2022-07-16 ENCOUNTER — Other Ambulatory Visit: Payer: Self-pay | Admitting: Internal Medicine

## 2022-09-14 ENCOUNTER — Encounter: Payer: Self-pay | Admitting: Internal Medicine

## 2022-09-14 ENCOUNTER — Ambulatory Visit (INDEPENDENT_AMBULATORY_CARE_PROVIDER_SITE_OTHER): Payer: Medicare Other | Admitting: Internal Medicine

## 2022-09-14 ENCOUNTER — Other Ambulatory Visit: Payer: Self-pay | Admitting: Internal Medicine

## 2022-09-14 VITALS — BP 120/82 | HR 93 | Temp 97.6°F | Ht 61.0 in | Wt 190.0 lb

## 2022-09-14 DIAGNOSIS — E038 Other specified hypothyroidism: Secondary | ICD-10-CM

## 2022-09-14 DIAGNOSIS — Z8619 Personal history of other infectious and parasitic diseases: Secondary | ICD-10-CM

## 2022-09-14 DIAGNOSIS — E7800 Pure hypercholesterolemia, unspecified: Secondary | ICD-10-CM

## 2022-09-14 DIAGNOSIS — G8929 Other chronic pain: Secondary | ICD-10-CM

## 2022-09-14 DIAGNOSIS — M25562 Pain in left knee: Secondary | ICD-10-CM | POA: Diagnosis not present

## 2022-09-14 DIAGNOSIS — R239 Unspecified skin changes: Secondary | ICD-10-CM

## 2022-09-14 DIAGNOSIS — I10 Essential (primary) hypertension: Secondary | ICD-10-CM

## 2022-09-14 DIAGNOSIS — Z79899 Other long term (current) drug therapy: Secondary | ICD-10-CM | POA: Diagnosis not present

## 2022-09-14 DIAGNOSIS — F439 Reaction to severe stress, unspecified: Secondary | ICD-10-CM

## 2022-09-14 MED ORDER — KETOCONAZOLE 2 % EX CREA: TOPICAL_CREAM | CUTANEOUS | 2 refills | Status: AC

## 2022-09-14 NOTE — Patient Instructions (Addendum)
Not sure but can try ketoconazole  shampoo  for upper body rash pigmentation . OTC   in case tinea versicolor  leave on lather for 10 - 15 minutes .   And wash off  and can repeat  weekly .   Will  place  future orders  to get labs pre visit cpe .

## 2022-09-14 NOTE — Progress Notes (Signed)
Chief Complaint  Patient presents with   Skin Concern    HPI: Toni [name] 67 y.o. come in for   concern at skin area  back area  and fter beach trip some whit patches on skin upper extremities  and a few other scaly lesions  non itchy   Under eval for foot and knee pain oa  Knee   [name]   needs replaced .   Limiting  cns meds .  Injection in knee and then had mri  knee , following .  And follow up.   Trying to use Less pain meds now and tylenol.   Stress  living alone now  Due for cpe soon  ROS: See pertinent positives and negatives per HPI.  Past Medical History:  Diagnosis Date   Brachial neuritis or radiculitis NOS    Chronic hepatitis C (HCC) 10/05/2015   treated and rsolved   Depressive disorder, not elsewhere classified    Essential and other specified forms of tremor    Fracture of 5th metatarsal 08/13/2017   Fracture of multiple pubic rami (HCC) 08/13/2017   Headache(784.0)    Hepatitis C infection 12/05/2017   Treated and now resolved and "cured " as of early 10/03/2016  And      Memory loss    better see evaluation   Microscopic hematuria    seed dr Patsi Sears in past   Migraine, unspecified, without mention of intractable migraine without mention of status migrainosus    Multiple rib fractures 08/13/2017   Other specified disorders of rotator cuff syndrome of shoulder and allied disorders    Vulvar pain    rx with valium supp    Family History  Problem Relation Age of Onset   Headache Mother    Stroke Father        father passed away june17   Diabetes Maternal Grandmother    Other Son        Mother is deceased 2011-10-04 father moved to the area 10-03-2012.    Social History   Socioeconomic History   Marital status: Divorced    Spouse name: Not on file   Number of children: 3   Years of education: Not on file   Highest education level: Bachelor's degree (e.g., BA, AB, BS)  Occupational History   Not on file  Tobacco Use   Smoking status: Former     Current packs/day: 0.00    Types: Cigarettes    Quit date: 05/23/1978    Years since quitting: 44.3   Smokeless tobacco: Never  Vaping Use   Vaping status: Never Used  Substance and Sexual Activity   Alcohol use: No    Alcohol/week: 0.0 standard drinks of alcohol   Drug use: Yes    Types: Hydrocodone   Sexual activity: Not Currently  Other Topics Concern   Not on file  Social History Narrative   Lives with grandson   Left Handed   Drinks 1-2 cups daily   Social Determinants of Health   Financial Resource Strain: Low Risk  (02/22/2022)   Overall Financial Resource Strain (CARDIA)    Difficulty of Paying Living Expenses: Not very hard  Food Insecurity: No Food Insecurity (02/22/2022)   Hunger Vital Sign    Worried About Running Out of Food in the Last Year: Never true    Ran Out of Food in the Last Year: Never true  Transportation Needs: No Transportation Needs (02/22/2022)   PRAPARE - Transportation    Lack  of Transportation (Medical): No    Lack of Transportation (Non-Medical): No  Physical Activity: Unknown (02/22/2022)   Exercise Vital Sign    Days of Exercise per Week: 0 days    Minutes of Exercise per Session: Not on file  Recent Concern: Physical Activity - Inactive (02/22/2022)   Exercise Vital Sign    Days of Exercise per Week: 0 days    Minutes of Exercise per Session: 0 min  Stress: Stress Concern Present (02/22/2022)   [employer] of Occupational Health - Occupational Stress Questionnaire    Feeling of Stress : Very much  Social Connections: Unknown (02/22/2022)   Social Connection and Isolation Panel [NHANES]    Frequency of Communication with Friends and Family: Once a week    Frequency of Social Gatherings with Friends and Family: Patient declined    Attends Religious Services: 1 to 4 times per year    Active Member of [employer] or Organizations: Yes    Attends Engineer, structural: More than 4 times per year    Marital Status: Divorced     Outpatient Medications Prior to Visit  Medication Sig Dispense Refill   albuterol (VENTOLIN HFA) 108 (90 Base) MCG/ACT inhaler Inhale 2 puffs into the lungs every 6 (six) hours as needed for wheezing or shortness of breath. 1 each 1   Alum Hydroxide-Mag Trisilicate 80-14.2 MG CHEW Chew by mouth. CHEW 2 TABLETS BY MOUTH BEFORE EACH MEAL AT BEDTIME AS NEEDED FOR INDIGESTION OR HEARTBURN     amLODipine (NORVASC) 2.5 MG tablet TAKE 1 TABLET BY MOUTH DAILY 90 tablet 1   cetirizine (ZYRTEC) 10 MG tablet Take 10 mg by mouth daily as needed for allergies.     clonazePAM (KLONOPIN) 0.5 MG tablet Take by mouth.     Cyanocobalamin ([employer] B12 GUMMIES PO) Take 1 capsule by mouth daily.     desvenlafaxine (PRISTIQ) 50 MG 24 hr tablet Take 50 mg by mouth daily.     dextroamphetamine (DEXEDRINE SPANSULE) 15 MG 24 hr capsule Take 30 mg by mouth daily. 2 po q am and 1 po q noon 01/27/22: weaning it down to 1 a day.     fluticasone (FLONASE) 50 MCG/ACT nasal spray Place 1 spray into both nostrils daily. 16 g 5   gabapentin (NEURONTIN) 600 MG tablet Take 600 mg by mouth. PO TID FOR HEAD PAIN     HYDROcodone-ibuprofen (VICOPROFEN) 7.5-200 MG tablet every 6 (six) hours as needed.      levothyroxine (SYNTHROID) 50 MCG tablet TAKE ONE TABLET BY MOUTH DAILY 90 tablet 3   Misc Natural Products (ESTROVEN + ENERGY MAX STRENGTH) TABS Take by mouth daily.     Multiple Vitamins-Minerals (CENTRUM SILVER ADULT 50+) TABS Take 1 tablet by mouth daily.     SUMAtriptan (IMITREX) 25 MG tablet Take 1 tablet (25 mg total) by mouth as needed for migraine. May repeat in 2 hours if headache persists or recurs. 10 tablet 11   SUMAtriptan 6 MG/0.5ML SOAJ Use 1 syringe for migraines upon awakening.  Do not use with Imitrex tablets. 0.5 mL 6   Vitamin D, Cholecalciferol, 25 MCG (1000 UT) CAPS Take 1 capsule by mouth daily.     ketoconazole (NIZORAL) 2 % cream APPLY TO AFFECTED AREA TWICE A DAY 30 g 2   BIOTIN PO Take 1 capsule by mouth  daily. Biotin hair and nails     No facility-administered medications prior to visit.     EXAM:  BP 120/82 (BP Location: Right  Arm, Patient Position: Sitting, Cuff Size: Large)   Pulse 93   Temp 97.6 F (36.4 C) (Oral)   Ht 5\' 1"  (1.549 m)   Wt 190 lb (86.2 kg)   SpO2 97%   BMI 35.90 kg/m   Body mass index is 35.9 kg/m. Wt Readings from Last 3 Encounters:  09/14/22 190 lb (86.2 kg)  04/27/22 185 lb (83.9 kg)  04/11/22 185 lb 6.4 oz (84.1 kg)    GENERAL: vitals reviewed and listed above, alert, oriented, appears well hydrated and in no acute distress HEENT: atraumatic, conjunctiva  clear, no obvious abnormalities on inspection of external nose and ears NECK: no obvious masses on inspection palpation  MS: moves all extremities  limmps favoring left knee  Skin some sun pigment changes  white spots upper extr  and a few round oval pink flat scaly rim area   back with a large Sk ( not new)  Nl speech and interaction stress   Lab Results  Component Value Date   WBC 4.9 03/21/2022   HGB 13.5 03/21/2022   HCT 42.1 03/21/2022   PLT 192 03/21/2022   GLUCOSE 104 (H) 03/21/2022   CHOL 253 (H) 08/31/2021   TRIG 99.0 08/31/2021   HDL 59.60 08/31/2021   LDLCALC 174 (H) 08/31/2021   ALT 16 03/21/2022   AST 21 03/21/2022   NA 140 03/21/2022   K 4.2 03/21/2022   CL 106 03/21/2022   CREATININE 0.97 03/21/2022   BUN 30 (H) 03/21/2022   CO2 26 03/21/2022   TSH 2.90 01/19/2022   INR 1.0 03/21/2022   HGBA1C 5.9 04/21/2021   BP Readings from Last 3 Encounters:  09/14/22 120/82  04/27/22 138/72  04/11/22 133/80    ASSESSMENT AND PLAN:  Discussed the following assessment and plan:  Skin change  Medication management  Chronic pain of left knee  Stress - w depressive sx  under care Last lipid panel a year ago  Need updated lab orders to included lfts with med changes  To get  before cpe visit   Disc options for skin I think prob sun changes and not fungal infection but  ok to empirically rx to see if helps. Nizoral shampoo   Struggling with knee pain and other issues  Working on getting off pain  meds   The 10-year ASCVD risk score (Arnett DK, et al., 2019) is: 9.7%   Values used to calculate the score:     Age: 48 years     Sex: Female     Is Non-Hispanic African American: No     Diabetic: No     Tobacco smoker: No     Systolic Blood Pressure: 120 mmHg     Is BP treated: Yes     HDL Cholesterol: 59.6 mg/dL     Total Cholesterol: 253 mg/dL 30 minutes review future orders counsel and assess  -Patient advised to return or notify health care team  if  new concerns arise.  Patient Instructions  Not sure but can try ketoconazole  shampoo  for upper body rash pigmentation . OTC   in case tinea versicolor  leave on lather for 10 - 15 minutes .   And wash off  and can repeat  weekly .   Will  place  future orders  to get labs pre visit cpe .  Neta Mends. Vanity Larsson M.D.

## 2022-09-28 ENCOUNTER — Other Ambulatory Visit: Payer: Medicare Other

## 2022-09-29 ENCOUNTER — Encounter: Payer: Self-pay | Admitting: Internal Medicine

## 2022-09-29 ENCOUNTER — Other Ambulatory Visit (INDEPENDENT_AMBULATORY_CARE_PROVIDER_SITE_OTHER): Payer: Medicare Other

## 2022-09-29 DIAGNOSIS — E7800 Pure hypercholesterolemia, unspecified: Secondary | ICD-10-CM | POA: Diagnosis not present

## 2022-09-29 DIAGNOSIS — E038 Other specified hypothyroidism: Secondary | ICD-10-CM

## 2022-09-29 DIAGNOSIS — G8929 Other chronic pain: Secondary | ICD-10-CM

## 2022-09-29 DIAGNOSIS — I10 Essential (primary) hypertension: Secondary | ICD-10-CM | POA: Diagnosis not present

## 2022-09-29 DIAGNOSIS — Z79899 Other long term (current) drug therapy: Secondary | ICD-10-CM | POA: Diagnosis not present

## 2022-09-29 DIAGNOSIS — Z8619 Personal history of other infectious and parasitic diseases: Secondary | ICD-10-CM

## 2022-09-29 LAB — CBC WITH DIFFERENTIAL/PLATELET
Basophils Absolute: 0 10*3/uL (ref 0.0–0.1)
Basophils Relative: 0.4 % (ref 0.0–3.0)
Eosinophils Absolute: 0 10*3/uL (ref 0.0–0.7)
Eosinophils Relative: 0.4 % (ref 0.0–5.0)
HCT: 42.4 % (ref 36.0–46.0)
Hemoglobin: 13.8 g/dL (ref 12.0–15.0)
Lymphocytes Relative: 27.6 % (ref 12.0–46.0)
Lymphs Abs: 1.8 10*3/uL (ref 0.7–4.0)
MCHC: 32.6 g/dL (ref 30.0–36.0)
MCV: 89.2 fl (ref 78.0–100.0)
Monocytes Absolute: 0.5 10*3/uL (ref 0.1–1.0)
Monocytes Relative: 7.5 % (ref 3.0–12.0)
Neutro Abs: 4.2 10*3/uL (ref 1.4–7.7)
Neutrophils Relative %: 64.1 % (ref 43.0–77.0)
Platelets: 230 10*3/uL (ref 150.0–400.0)
RBC: 4.76 Mil/uL (ref 3.87–5.11)
RDW: 13.8 % (ref 11.5–15.5)
WBC: 6.6 10*3/uL (ref 4.0–10.5)

## 2022-09-29 LAB — LIPID PANEL
Cholesterol: 223 mg/dL — ABNORMAL HIGH (ref 0–200)
HDL: 61.4 mg/dL (ref >39.00)
LDL Cholesterol: 147 mg/dL — ABNORMAL HIGH (ref 0–99)
NonHDL: 161.98
Total CHOL/HDL Ratio: 4
Triglycerides: 76 mg/dL (ref 0.0–149.0)
VLDL: 15.2 mg/dL (ref 0.0–40.0)

## 2022-09-29 LAB — BASIC METABOLIC PANEL
BUN: 21 mg/dL (ref 6–23)
CO2: 30 mEq/L (ref 19–32)
Calcium: 9.8 mg/dL (ref 8.4–10.5)
Chloride: 104 mEq/L (ref 96–112)
Creatinine, Ser: 0.82 mg/dL (ref 0.40–1.20)
GFR: 73.21 mL/min (ref >60.00)
Glucose, Bld: 100 mg/dL — ABNORMAL HIGH (ref 70–99)
Potassium: 4.3 mEq/L (ref 3.5–5.1)
Sodium: 141 mEq/L (ref 135–145)

## 2022-09-29 LAB — HEPATIC FUNCTION PANEL
ALT: 13 U/L (ref 0–35)
AST: 17 U/L (ref 0–37)
Albumin: 4.5 g/dL (ref 3.5–5.2)
Alkaline Phosphatase: 74 U/L (ref 39–117)
Bilirubin, Direct: 0.1 mg/dL (ref 0.0–0.3)
Total Bilirubin: 0.4 mg/dL (ref 0.2–1.2)
Total Protein: 7.6 g/dL (ref 6.0–8.3)

## 2022-09-29 LAB — T4, FREE: Free T4: 0.78 ng/dL (ref 0.60–1.60)

## 2022-09-29 LAB — HEMOGLOBIN A1C: Hgb A1c MFr Bld: 5.7 % (ref 4.6–6.5)

## 2022-09-29 LAB — TSH: TSH: 1.18 u[IU]/mL (ref 0.35–5.50)

## 2022-10-02 LAB — LIPOPROTEIN A (LPA): Lipoprotein (a): 83 nmol/L — ABNORMAL HIGH (ref <75)

## 2022-10-03 NOTE — Progress Notes (Unsigned)
No chief complaint on file.   HPI: Patient  Toni [name]  67 y.o. comes in today for Preventive Health Care visit  And med check  Hx of multiple medical problems  Under care neuro ortho psych  Health Maintenance  Topic Date Due   Zoster Vaccines- Shingrix (1 of 2) Never done   Pneumonia Vaccine 10+ Years old (2 of 2 - PPSV23 or PCV20) 05/14/2020   INFLUENZA VACCINE  08/04/2022   Medicare Annual Wellness (AWV)  09/01/2022   COVID-19 Vaccine (5 - 2023-24 season) 09/04/2022   MAMMOGRAM  01/06/2023   Colonoscopy  03/29/2026   DTaP/Tdap/Td (2 - Tdap) 07/20/2027   DEXA SCAN  Completed   Hepatitis C Screening  Completed   HPV VACCINES  Aged Out   Health Maintenance Review LIFESTYLE:  Exercise:   Tobacco/ETS: Alcohol:  Sugar beverages: Sleep: Drug use: no HH of  Work:    ROS:  GEN/ HEENT: No fever, significant weight changes sweats headaches vision problems hearing changes, CV/ PULM; No chest pain shortness of breath cough, syncope,edema  change in exercise tolerance. GI /GU: No adominal pain, vomiting, change in bowel habits. No blood in the stool. No significant GU symptoms. SKIN/HEME: ,no acute skin rashes suspicious lesions or bleeding. No lymphadenopathy, nodules, masses.  NEURO/ PSYCH:  No neurologic signs such as weakness numbness. No depression anxiety. IMM/ Allergy: No unusual infections.  Allergy .   REST of 12 system review negative except as per HPI   Past Medical History:  Diagnosis Date   Brachial neuritis or radiculitis NOS    Chronic hepatitis C (HCC) 10/05/2015   treated and rsolved   Depressive disorder, not elsewhere classified    Essential and other specified forms of tremor    Fracture of 5th metatarsal 08/13/2017   Fracture of multiple pubic rami (HCC) 08/13/2017   Headache(784.0)    Hepatitis C infection 12/05/2017   Treated and now resolved and "cured " as of early 11/16/2016  And      Memory loss    better see evaluation    Microscopic hematuria    seed dr Patsi Sears in past   Migraine, unspecified, without mention of intractable migraine without mention of status migrainosus    Multiple rib fractures 08/13/2017   Other specified disorders of rotator cuff syndrome of shoulder and allied disorders    Vulvar pain    rx with valium supp    Past Surgical History:  Procedure Laterality Date   BREAST EXCISIONAL BIOPSY Right    No scar   CESAREAN SECTION     x3   SHOULDER ARTHROSCOPY W/ ROTATOR CUFF REPAIR Right 11/17/2002   SHOULDER ARTHROSCOPY W/ ROTATOR CUFF REPAIR Left 11-17-2006    Family History  Problem Relation Age of Onset   Headache Mother    Stroke Father        father passed away june17   Diabetes Maternal Grandmother    Other Son        Mother is deceased 2011-11-17 father moved to the area 11-16-2012.    Social History   Socioeconomic History   Marital status: Divorced    Spouse name: Not on file   Number of children: 3   Years of education: Not on file   Highest education level: Bachelor's degree (e.g., BA, AB, BS)  Occupational History   Not on file  Tobacco Use   Smoking status: Former    Current packs/day: 0.00    Types: Cigarettes  Quit date: 05/23/1978    Years since quitting: 44.3   Smokeless tobacco: Never  Vaping Use   Vaping status: Never Used  Substance and Sexual Activity   Alcohol use: No    Alcohol/week: 0.0 standard drinks of alcohol   Drug use: Yes    Types: Hydrocodone   Sexual activity: Not Currently  Other Topics Concern   Not on file  Social History Narrative   Lives with grandson   Left Handed   Drinks 1-2 cups daily   Social Determinants of Health   Financial Resource Strain: Low Risk  (02/22/2022)   Overall Financial Resource Strain (CARDIA)    Difficulty of Paying Living Expenses: Not very hard  Food Insecurity: No Food Insecurity (02/22/2022)   Hunger Vital Sign    Worried About Running Out of Food in the Last Year: Never true    Ran Out of Food in the Last  Year: Never true  Transportation Needs: No Transportation Needs (02/22/2022)   PRAPARE - Administrator, Civil Service (Medical): No    Lack of Transportation (Non-Medical): No  Physical Activity: Unknown (02/22/2022)   Exercise Vital Sign    Days of Exercise per Week: 0 days    Minutes of Exercise per Session: Not on file  Recent Concern: Physical Activity - Inactive (02/22/2022)   Exercise Vital Sign    Days of Exercise per Week: 0 days    Minutes of Exercise per Session: 0 min  Stress: Stress Concern Present (02/22/2022)   [employer] of Occupational Health - Occupational Stress Questionnaire    Feeling of Stress : Very much  Social Connections: Unknown (02/22/2022)   Social Connection and Isolation Panel [NHANES]    Frequency of Communication with Friends and Family: Once a week    Frequency of Social Gatherings with Friends and Family: Patient declined    Attends Religious Services: 1 to 4 times per year    Active Member of [employer] or Organizations: Yes    Attends Engineer, structural: More than 4 times per year    Marital Status: Divorced    Outpatient Medications Prior to Visit  Medication Sig Dispense Refill   albuterol (VENTOLIN HFA) 108 (90 Base) MCG/ACT inhaler Inhale 2 puffs into the lungs every 6 (six) hours as needed for wheezing or shortness of breath. 1 each 1   Alum Hydroxide-Mag Trisilicate 80-14.2 MG CHEW Chew by mouth. CHEW 2 TABLETS BY MOUTH BEFORE EACH MEAL AT BEDTIME AS NEEDED FOR INDIGESTION OR HEARTBURN     amLODipine (NORVASC) 2.5 MG tablet TAKE 1 TABLET BY MOUTH DAILY 90 tablet 1   cetirizine (ZYRTEC) 10 MG tablet Take 10 mg by mouth daily as needed for allergies.     clonazePAM (KLONOPIN) 0.5 MG tablet Take by mouth.     Cyanocobalamin (CVS B12 GUMMIES PO) Take 1 capsule by mouth daily.     desvenlafaxine (PRISTIQ) 50 MG 24 hr tablet Take 50 mg by mouth daily.     dextroamphetamine (DEXEDRINE SPANSULE) 15 MG 24 hr capsule Take 30 mg by  mouth daily. 2 po q am and 1 po q noon 01/27/22: weaning it down to 1 a day.     fluticasone (FLONASE) 50 MCG/ACT nasal spray Place 1 spray into both nostrils daily. 16 g 5   gabapentin (NEURONTIN) 600 MG tablet Take 600 mg by mouth. PO TID FOR HEAD PAIN     HYDROcodone-ibuprofen (VICOPROFEN) 7.5-200 MG tablet every 6 (six) hours as needed.  ketoconazole (NIZORAL) 2 % cream APPLY TO AFFECTED AREA TWICE A DAY 30 g 2   levothyroxine (SYNTHROID) 50 MCG tablet TAKE ONE TABLET BY MOUTH DAILY 90 tablet 3   Misc Natural Products (ESTROVEN + ENERGY MAX STRENGTH) TABS Take by mouth daily.     Multiple Vitamins-Minerals (CENTRUM SILVER ADULT 50+) TABS Take 1 tablet by mouth daily.     SUMAtriptan (IMITREX) 25 MG tablet Take 1 tablet (25 mg total) by mouth as needed for migraine. May repeat in 2 hours if headache persists or recurs. 10 tablet 11   SUMAtriptan 6 MG/0.5ML SOAJ Use 1 syringe for migraines upon awakening.  Do not use with Imitrex tablets. 0.5 mL 6   Vitamin D, Cholecalciferol, 25 MCG (1000 UT) CAPS Take 1 capsule by mouth daily.     No facility-administered medications prior to visit.     EXAM:  There were no vitals taken for this visit.  There is no height or weight on file to calculate BMI. Wt Readings from Last 3 Encounters:  09/14/22 190 lb (86.2 kg)  04/27/22 185 lb (83.9 kg)  04/11/22 185 lb 6.4 oz (84.1 kg)    Physical Exam: Vital signs reviewed FAO:ZHYQ is a well-developed well-nourished alert cooperative    who appearsr stated age in no acute distress.  HEENT: normocephalic atraumatic , Eyes: PERRL EOM's full, conjunctiva clear, Nares: paten,t no deformity discharge or tenderness., Ears: no deformity EAC's clear TMs with normal landmarks. Mouth: clear OP, no lesions, edema.  Moist mucous membranes. Dentition in adequate repair. NECK: supple without masses, thyromegaly or bruits. CHEST/PULM:  Clear to auscultation and percussion breath sounds equal no wheeze , rales or  rhonchi. No chest wall deformities or tenderness. Breast: normal by inspection . No dimpling, discharge, masses, tenderness or discharge . CV: PMI is nondisplaced, S1 S2 no gallops, murmurs, rubs. Peripheral pulses are full without delay.No JVD .  ABDOMEN: Bowel sounds normal nontender  No guard or rebound, no hepato splenomegal no CVA tenderness.  No hernia. Extremtities:  No clubbing cyanosis or edema, no acute joint swelling or redness no focal atrophy NEURO:  Oriented x3, cranial nerves 3-12 appear to be intact, no obvious focal weakness,gait within normal limits no abnormal reflexes or asymmetrical SKIN: No acute rashes normal turgor, color, no bruising or petechiae. PSYCH: Oriented, good eye contact, no obvious depression anxiety, cognition and judgment appear normal. LN: no cervical axillary inguinal adenopathy  Lab Results  Component Value Date   WBC 6.6 09/29/2022   HGB 13.8 09/29/2022   HCT 42.4 09/29/2022   PLT 230.0 09/29/2022   GLUCOSE 100 (H) 09/29/2022   CHOL 223 (H) 09/29/2022   TRIG 76.0 09/29/2022   HDL 61.40 09/29/2022   LDLCALC 147 (H) 09/29/2022   ALT 13 09/29/2022   AST 17 09/29/2022   NA 141 09/29/2022   K 4.3 09/29/2022   CL 104 09/29/2022   CREATININE 0.82 09/29/2022   BUN 21 09/29/2022   CO2 30 09/29/2022   TSH 1.18 09/29/2022   INR 1.0 03/21/2022   HGBA1C 5.7 09/29/2022    BP Readings from Last 3 Encounters:  09/14/22 120/82  04/27/22 138/72  04/11/22 133/80    Lab results reviewed with patient  Lipo a mod elevated  consider ct  calcium score  The 10-year ASCVD risk score (Arnett DK, et al., 2019) is: 9.1%   Values used to calculate the score:     Age: 70 years     Sex: Female  Is Non-Hispanic African American: No     Diabetic: No     Tobacco smoker: No     Systolic Blood Pressure: 120 mmHg     Is BP treated: Yes     HDL Cholesterol: 61.4 mg/dL     Total Cholesterol: 223 mg/dL  ASSESSMENT AND PLAN:  Discussed the following  assessment and plan:  No diagnosis found. No follow-ups on file.  Patient Care Team: Berit Raczkowski, Neta Mends, MD as PCP - General (Internal Medicine) Milagros Evener, MD as Consulting Physician (Psychiatry) Candace Gallus as Physician Assistant (Neurosurgery) York Spaniel, MD (Inactive) as Consulting Physician (Neurology) There are no Patient Instructions on file for this visit.  Neta Mends. Buddie Marston M.D.

## 2022-10-04 ENCOUNTER — Ambulatory Visit (INDEPENDENT_AMBULATORY_CARE_PROVIDER_SITE_OTHER): Payer: Medicare Other | Admitting: Internal Medicine

## 2022-10-04 ENCOUNTER — Encounter: Payer: Self-pay | Admitting: Internal Medicine

## 2022-10-04 VITALS — BP 148/86 | HR 84 | Temp 98.2°F | Ht 61.8 in | Wt 186.4 lb

## 2022-10-04 DIAGNOSIS — G8929 Other chronic pain: Secondary | ICD-10-CM

## 2022-10-04 DIAGNOSIS — Z0000 Encounter for general adult medical examination without abnormal findings: Secondary | ICD-10-CM

## 2022-10-04 DIAGNOSIS — Z79899 Other long term (current) drug therapy: Secondary | ICD-10-CM

## 2022-10-04 DIAGNOSIS — E2839 Other primary ovarian failure: Secondary | ICD-10-CM | POA: Diagnosis not present

## 2022-10-04 DIAGNOSIS — I10 Essential (primary) hypertension: Secondary | ICD-10-CM

## 2022-10-04 DIAGNOSIS — E7800 Pure hypercholesterolemia, unspecified: Secondary | ICD-10-CM

## 2022-10-04 DIAGNOSIS — E038 Other specified hypothyroidism: Secondary | ICD-10-CM

## 2022-10-04 DIAGNOSIS — Z9189 Other specified personal risk factors, not elsewhere classified: Secondary | ICD-10-CM

## 2022-10-04 DIAGNOSIS — F439 Reaction to severe stress, unspecified: Secondary | ICD-10-CM

## 2022-10-04 DIAGNOSIS — M25562 Pain in left knee: Secondary | ICD-10-CM

## 2022-10-04 NOTE — Patient Instructions (Addendum)
Good to see you today .  Will order self pay 99$ ct calcium coronary scan to assess future risk assessment . Consideration of   adding statin medication or other for risk reduction. Make sure bp is in range 130 /80 and below average at home.  If not we can increase dose of amlodipine to 5 mg per day .   Have a good trip.

## 2022-10-24 ENCOUNTER — Other Ambulatory Visit: Payer: Self-pay

## 2022-10-24 MED ORDER — AMLODIPINE BESYLATE 2.5 MG PO TABS: 2.5000 mg | ORAL_TABLET | Freq: Every day | ORAL | 1 refills | Status: DC

## 2022-11-03 ENCOUNTER — Ambulatory Visit (HOSPITAL_BASED_OUTPATIENT_CLINIC_OR_DEPARTMENT_OTHER)
Admission: RE | Admit: 2022-11-03 | Discharge: 2022-11-03 | Disposition: A | Discharge status: Home / Self Care | Payer: Medicare Other | Source: Ambulatory Visit | Attending: Internal Medicine | Admitting: Internal Medicine

## 2022-11-03 DIAGNOSIS — E7800 Pure hypercholesterolemia, unspecified: Secondary | ICD-10-CM | POA: Insufficient documentation

## 2022-11-03 DIAGNOSIS — Z9189 Other specified personal risk factors, not elsewhere classified: Secondary | ICD-10-CM | POA: Insufficient documentation

## 2022-11-28 ENCOUNTER — Other Ambulatory Visit: Payer: Self-pay

## 2022-11-28 ENCOUNTER — Ambulatory Visit: Payer: Medicare Other | Attending: Orthopedic Surgery

## 2022-11-28 DIAGNOSIS — M25662 Stiffness of left knee, not elsewhere classified: Secondary | ICD-10-CM | POA: Insufficient documentation

## 2022-11-28 DIAGNOSIS — G8929 Other chronic pain: Secondary | ICD-10-CM | POA: Insufficient documentation

## 2022-11-28 DIAGNOSIS — R262 Difficulty in walking, not elsewhere classified: Secondary | ICD-10-CM | POA: Insufficient documentation

## 2022-11-28 DIAGNOSIS — M25562 Pain in left knee: Secondary | ICD-10-CM | POA: Insufficient documentation

## 2022-11-28 NOTE — Therapy (Signed)
OUTPATIENT PHYSICAL THERAPY LOWER EXTREMITY EVALUATION   Patient Name: Toni [name] MRN: [id number] DOB:28-Jun-1955, 67 y.o., female Today's Date: 11/29/2022  END OF SESSION:  PT End of Session - 11/28/22 1702     Visit Number 1    Date for PT Re-Evaluation 01/23/23    Progress Note Due on Visit 10    PT Start Time 1430    PT Stop Time 1518    PT Time Calculation (min) 48 min    Activity Tolerance Patient tolerated treatment well;Patient limited by pain    Behavior During Therapy [facility] for tasks assessed/performed;Anxious             Past Medical History:  Diagnosis Date   Brachial neuritis or radiculitis NOS    Chronic hepatitis C (HCC) 10/05/2015   treated and rsolved   Depressive disorder, not elsewhere classified    Essential and other specified forms of tremor    Fracture of 5th metatarsal 08/13/2017   Fracture of multiple pubic rami (HCC) 08/13/2017   Headache(784.0)    Hepatitis C infection 12/05/2017   Treated and now resolved and "cured " as of early 2018  And      Memory loss    better see evaluation   Microscopic hematuria    seed dr Patsi Sears in past   Migraine, unspecified, without mention of intractable migraine without mention of status migrainosus    Multiple rib fractures 08/13/2017   Other specified disorders of rotator cuff syndrome of shoulder and allied disorders    Vulvar pain    rx with valium supp   Past Surgical History:  Procedure Laterality Date   BREAST EXCISIONAL BIOPSY Right    No scar   CESAREAN SECTION     x3   SHOULDER ARTHROSCOPY W/ ROTATOR CUFF REPAIR Right 2004   SHOULDER ARTHROSCOPY W/ ROTATOR CUFF REPAIR Left 2008   Patient Active Problem List   Diagnosis Date Noted   Easy bruising 03/21/2022   Common migraine with intractable migraine 12/10/2019   Chronic pain 08/13/2017   Occipital neuralgia of right side 10/21/2016   Chronic migraine 01/26/2015   Cervicogenic headache 12/16/2014   Subclinical  hypothyroidism 12/16/2014   Memory loss 10/08/2014   Depression 10/08/2014   Generalized anxiety disorder 10/08/2014   Tremor 10/08/2014   Major depressive disorder, single episode, moderate (HCC) 08/27/2014   Memory difficulties 08/27/2014   Degenerative disc disease, cervical 08/27/2014   Chronically on opiate therapy 08/27/2014    PCP: Madelin Headings  REFERRING PROVIDER: Duwayne Heck, MD, [facility]  REFERRING DIAG: s/p arthroscopy , partial lateral meniscectomy, chrondroplasty L medial femoral condyle, medial meniscus repair.   THERAPY DIAG:  Difficulty in walking, not elsewhere classified  Chronic pain of left knee  Decreased ROM of left knee  Rationale for Evaluation and Treatment: Rehabilitation  ONSET DATE: 11/21/22  SUBJECTIVE:   SUBJECTIVE STATEMENT: The patient has many concerns:  having to put more weight on L Leg than she is supposed to, poor fit of L DonJoy brace, unable to reach her L foot to don/doff sock, unable to manage the brace by herself, lots of pain and bruising L ankle lat and med knee, some from the brace  PERTINENT HISTORY: Patient with chronic L knee pain, underwent surgery as described above, referred for outpt PT PAIN:  Are you having pain? Yes: NPRS scale: 7/10 Pain location: L knee, lower leg, ankle Pain description: constant Aggravating factors: the brace rubs, walking, weight bearing Relieving factors: elevating leg  PRECAUTIONS: Other: 50% weight bearing, until 2 weeks post op, Dec 05, 2022, no weight bearing flexion L knee until 6 weeks 01/02/23, ROM out of the brace 2 weeks post op, Dec 05, 2022  RED FLAGS: None   WEIGHT BEARING RESTRICTIONS: Yes 50% for initial 6 weeks, to 01/02/23  FALLS:  Has patient fallen in last 6 months? Yes. Number of falls unclear  LIVING ENVIRONMENT: Lives with: lives alone Lives in: House/apartment Stairs: No Has following equipment at home: Single point cane and Environmental consultant - 2 wheeled  OCCUPATION:  retired   PLOF: Independent with basic ADLs  PATIENT GOALS: get to full independence , able to maintain I at  my town home  NEXT MD VISIT: Dec 07 2022  OBJECTIVE:  Note: Objective measures were completed at Evaluation unless otherwise noted.  DIAGNOSTIC FINDINGS: na, see op note in media  PATIENT SURVEYS:  Tegner Lysholm Knee Score: 19 / 100 = 19.0 %   COGNITION: Overall cognitive status: Within functional limits for tasks assessed     SENSATION: WFL  EDEMA:  Mild edema, with multiple healing hematomas entire L LE    POSTURE: rounded shoulders, forward head, and Don joy brace  L LE, brace 2" too low, distally positioned.  Has ace wrap under brace with multiple areas creating indentation skin L leg  PALPATION: Tender along entire L tibia, with patellar mobs, medial and lateral thigh. No warmth or redness noted skin  LOWER EXTREMITY GEX:BMWUXL to assess L knee ROM due to surgical restrictions   LOWER EXTREMITY MMT: able to contract L quads, able to perform SLR within the brace.   LOWER EXTREMITY SPECIAL TESTS:  na  FUNCTIONAL TESTS:  Patient able to get on and off treatment table I and able to move sit to stand with r walker with SBA .  Very slow, guarded with all movements  GAIT: Distance walked: 71' in clinic Assistive device utilized: Walker - 2 wheeled Level of assistance: Modified independence Comments: patient is supposed to be 50% wbing, unclear whether she is really adhering to this restriction.  Walks with step to pattern with walker, leading with L LE, L knee extended in immobilizer   TODAY'S TREATMENT:                                                                                                                              DATE: 11/28/22: Evaluation, primarily spent most of time repositioning and educating the pt in correct positioning of the [facility] joy splint, marked correct position with marker on pts leg for her. Inst in quad sets within brace.   Called orthopedist for specific precautions regarding pts progression with PT    PATIENT EDUCATION:  Education details: POC, goals Person educated: Patient Education method: Explanation, Demonstration, and Tactile cues Education comprehension: verbalized understanding, returned demonstration, and verbal cues required  HOME EXERCISE PROGRAM: Inst in correct position of brace . And in quad sets   ASSESSMENT:  CLINICAL IMPRESSION: Patient is a 67 y.o. female who was evaluated today by skilled physical therapy following extensive surgery due to L meniscus tear and medial femoral condyle debridement.   She is limited currently in ability to perform ROM and strengthening until 2 weeks post op on Dec 2.  She was in high pain, improved by end of session due to poor fit/position of brace L knee.  She is having much difficulty with her overall mobility, lives alone and can't reach her L foot to don/doff socks, compression stockings.  Reliant on her neighbor for assistance with transportation and with groceries, etc.  She demonstrates palpable quad set abd able to SLR L I.  Not really following 50% wbing restriction.  Should benefit from skilled PT to address her recovery of function L LE. OBJECTIVE IMPAIRMENTS: decreased activity tolerance, decreased balance, decreased endurance, decreased knowledge of condition, decreased mobility, difficulty walking, decreased ROM, decreased strength, increased edema, impaired perceived functional ability, impaired flexibility, and pain.   ACTIVITY LIMITATIONS: carrying, lifting, bending, standing, squatting, and stairs  PARTICIPATION LIMITATIONS: meal prep, cleaning, laundry, driving, shopping, community activity, and yard work  PERSONAL FACTORS: Behavior pattern, Education, Fitness, Past/current experiences, and 3+ comorbidities: major depressive disorder, chronic hepititis, multiple hip and shoulder injuries and arthroscopic repairs  are also affecting patient's  functional outcome.   REHAB POTENTIAL: Fair    CLINICAL DECISION MAKING: Evolving/moderate complexity  EVALUATION COMPLEXITY: Moderate   GOALS: Goals reviewed with patient? Yes  SHORT TERM GOALS: Target date: 2 weeks 12/05/22 I HEP Baseline: Goal status: INITIAL   LONG TERM GOALS: Target date: 01/23/23  Tegner Lysholm Knee Score: 19 / 100 = 19.0 % improve to 80/100 Baseline:  Goal status: INITIAL  2.  Improve L knee flexibility to 0 to 120 Baseline: flex NT, ext 0 Goal status: INITIAL  3.  L LE strength 5/5 for normal transfers,functional gait Baseline:  Goal status: INITIAL  4.  30 sec sit to stand 14 reps or greater for normal transfers, gait Baseline:  Goal status: INITIAL    PLAN:  PT FREQUENCY: 2x/week  PT DURATION: 8 weeks  PLANNED INTERVENTIONS: 97110-Therapeutic exercises, 97530- Therapeutic activity, 97112- Neuromuscular re-education, 97535- Self Care, 16109- Manual therapy, and 97014- Electrical stimulation (unattended)  PLAN FOR NEXT SESSION: Reinstruct in therex with brace on, quad sets, SLR, ankle pumps, add t band for ankle pumps.  Recheck position of splint   Else Habermann L Mortimer Bair, PT, DPT, OCS 11/29/2022, 8:50 AM

## 2022-11-30 ENCOUNTER — Ambulatory Visit: Payer: Medicare Other

## 2022-11-30 ENCOUNTER — Other Ambulatory Visit: Payer: Self-pay

## 2022-11-30 DIAGNOSIS — R262 Difficulty in walking, not elsewhere classified: Secondary | ICD-10-CM

## 2022-11-30 DIAGNOSIS — M25662 Stiffness of left knee, not elsewhere classified: Secondary | ICD-10-CM

## 2022-11-30 DIAGNOSIS — G8929 Other chronic pain: Secondary | ICD-10-CM

## 2022-11-30 NOTE — Therapy (Signed)
OUTPATIENT PHYSICAL THERAPY LOWER EXTREMITY EVALUATION   Patient Name: Cola Grainer MRN: [id number] DOB:1955-12-19, 67 y.o., female Today's Date: 11/30/2022  END OF SESSION:  PT End of Session - 11/30/22 1712     Visit Number 2    Date for PT Re-Evaluation 01/23/23    Progress Note Due on Visit 10    PT Start Time 1430    PT Stop Time 1518    PT Time Calculation (min) 48 min    Activity Tolerance Patient tolerated treatment well;Patient limited by pain    Behavior During Therapy [facility] for tasks assessed/performed;Anxious              Past Medical History:  Diagnosis Date   Brachial neuritis or radiculitis NOS    Chronic hepatitis C (HCC) 10/05/2015   treated and rsolved   Depressive disorder, not elsewhere classified    Essential and other specified forms of tremor    Fracture of 5th metatarsal 08/13/2017   Fracture of multiple pubic rami (HCC) 08/13/2017   Headache(784.0)    Hepatitis C infection 12/05/2017   Treated and now resolved and "cured " as of early 2018  And      Memory loss    better see evaluation   Microscopic hematuria    seed dr Patsi Sears in past   Migraine, unspecified, without mention of intractable migraine without mention of status migrainosus    Multiple rib fractures 08/13/2017   Other specified disorders of rotator cuff syndrome of shoulder and allied disorders    Vulvar pain    rx with valium supp   Past Surgical History:  Procedure Laterality Date   BREAST EXCISIONAL BIOPSY Right    No scar   CESAREAN SECTION     x3   SHOULDER ARTHROSCOPY W/ ROTATOR CUFF REPAIR Right 2004   SHOULDER ARTHROSCOPY W/ ROTATOR CUFF REPAIR Left 2008   Patient Active Problem List   Diagnosis Date Noted   Easy bruising 03/21/2022   Common migraine with intractable migraine 12/10/2019   Chronic pain 08/13/2017   Occipital neuralgia of right side 10/21/2016   Chronic migraine 01/26/2015   Cervicogenic headache 12/16/2014   Subclinical  hypothyroidism 12/16/2014   Memory loss 10/08/2014   Depression 10/08/2014   Generalized anxiety disorder 10/08/2014   Tremor 10/08/2014   Major depressive disorder, single episode, moderate (HCC) 08/27/2014   Memory difficulties 08/27/2014   Degenerative disc disease, cervical 08/27/2014   Chronically on opiate therapy 08/27/2014    PCP: Madelin Headings  REFERRING PROVIDER: Duwayne Heck, MD, [facility]  REFERRING DIAG: s/p arthroscopy , partial lateral meniscectomy, chrondroplasty L medial femoral condyle, medial meniscus repair.   THERAPY DIAG:  Difficulty in walking, not elsewhere classified  Chronic pain of left knee  Decreased ROM of left knee  Rationale for Evaluation and Treatment: Rehabilitation  ONSET DATE: 11/21/22  SUBJECTIVE:   SUBJECTIVE STATEMENT: I have been able to obtain help with transportation, also my ex husband is coming to help me as I recover so I will have some help  EVAL:The patient has many concerns:  having to put more weight on L Leg than she is supposed to, poor fit of L DonJoy brace, unable to reach her L foot to don/doff sock, unable to manage the brace by herself, lots of pain and bruising L ankle lat and med knee, some from the brace  PERTINENT HISTORY: Patient with chronic L knee pain, underwent surgery as described above, referred for outpt PT PAIN:  Are  you having pain? Yes: NPRS scale: 7/10 Pain location: L knee, lower leg, ankle Pain description: constant Aggravating factors: the brace rubs, walking, weight bearing Relieving factors: elevating leg  PRECAUTIONS: Other: 50% weight bearing, until 2 weeks post op, Dec 05, 2022, no weight bearing flexion L knee until 6 weeks 01/02/23, ROM out of the brace 2 weeks post op, Dec 05, 2022  RED FLAGS: None   WEIGHT BEARING RESTRICTIONS: Yes 50% for initial 6 weeks, to 01/02/23  FALLS:  Has patient fallen in last 6 months? Yes. Number of falls unclear  LIVING ENVIRONMENT: Lives with:  lives alone Lives in: House/apartment Stairs: No Has following equipment at home: Single point cane and Environmental consultant - 2 wheeled  OCCUPATION: retired   PLOF: Independent with basic ADLs  PATIENT GOALS: get to full independence , able to maintain I at  my town home  NEXT MD VISIT: Dec 07 2022  OBJECTIVE:  Note: Objective measures were completed at Evaluation unless otherwise noted.  DIAGNOSTIC FINDINGS: na, see op note in media  PATIENT SURVEYS:  Tegner Lysholm Knee Score: 19 / 100 = 19.0 %   COGNITION: Overall cognitive status: Within functional limits for tasks assessed     SENSATION: WFL  EDEMA:  Mild edema, with multiple healing hematomas entire L LE    POSTURE: rounded shoulders, forward head, and Don joy brace  L LE, brace 2" too low, distally positioned.  Has ace wrap under brace with multiple areas creating indentation skin L leg  PALPATION: Tender along entire L tibia, with patellar mobs, medial and lateral thigh. No warmth or redness noted skin  LOWER EXTREMITY UXL:[id number] to assess L knee ROM due to surgical restrictions   LOWER EXTREMITY MMT: able to contract L quads, able to perform SLR within the brace.   LOWER EXTREMITY SPECIAL TESTS:  na  FUNCTIONAL TESTS:  Patient able to get on and off treatment table I and able to move sit to stand with r walker with SBA .  Very slow, guarded with all movements  GAIT: Distance walked: 90' in clinic Assistive device utilized: Walker - 2 wheeled Level of assistance: Modified independence Comments: patient is supposed to be 50% wbing, unclear whether she is really adhering to this restriction.  Walks with step to pattern with walker, leading with L LE, L knee extended in immobilizer   TODAY'S TREATMENT:                                                                                                                              DATE:  11/30/22:  Therapeutic activities:   Spent a good portion of today's session  repositioning the Donjoy  brace and trying to make it more comfortable, removed the ace wrap and added non adherent guaze that the pt brought with her to cover her steristrips and sensitive area L medial thigh, then repositioned the splint.  Therex:  pt performed quad sets, with brace open and  L leg resting on table, also SLR with and without the brace Patient instructed in and performed ankle pumps with blue theraband. 15 x , provided with band to use at home.    11/28/22: Evaluation, primarily spent most of time repositioning and educating the pt in correct positioning of the [facility] joy splint, marked correct position with marker on pts leg for her. Inst in quad sets within brace.  Called orthopedist for specific precautions regarding pts progression with PT    PATIENT EDUCATION:  Education details: POC, goals Person educated: Patient Education method: Explanation, Demonstration, and Tactile cues Education comprehension: verbalized understanding, returned demonstration, and verbal cues required  HOME EXERCISE PROGRAM: Inst in correct position of brace . And in quad sets   ASSESSMENT:  CLINICAL IMPRESSION: Patient is a 67 y.o. female who participated in  skilled physical therapy session due to L meniscus tear and medial femoral condyle debridement.   She is limited currently in ability to perform ROM and strengthening until 2 weeks post op on Dec 2.  She was in less pain today, still difficulty with positioning and discomfort from the brace.  She demonstrates palpable quad set abd able to SLR L I.  She is trying to follow  50% wbing restriction, has bruising on her R palm due to use of walker. Tolerated today's session well, able to perform SLR very well without lag . Will be able to start ROM L knee next session.   Should benefit from skilled PT to address her recovery of function L LE. OBJECTIVE IMPAIRMENTS: decreased activity tolerance, decreased balance, decreased endurance, decreased  knowledge of condition, decreased mobility, difficulty walking, decreased ROM, decreased strength, increased edema, impaired perceived functional ability, impaired flexibility, and pain.   ACTIVITY LIMITATIONS: carrying, lifting, bending, standing, squatting, and stairs  PARTICIPATION LIMITATIONS: meal prep, cleaning, laundry, driving, shopping, community activity, and yard work  PERSONAL FACTORS: Behavior pattern, Education, Fitness, Past/current experiences, and 3+ comorbidities: major depressive disorder, chronic hepititis, multiple hip and shoulder injuries and arthroscopic repairs  are also affecting patient's functional outcome.   REHAB POTENTIAL: Fair    CLINICAL DECISION MAKING: Evolving/moderate complexity  EVALUATION COMPLEXITY: Moderate   GOALS: Goals reviewed with patient? Yes  SHORT TERM GOALS: Target date: 2 weeks 12/05/22 I HEP Baseline: Goal status: INITIAL   LONG TERM GOALS: Target date: 01/23/23  Tegner Lysholm Knee Score: 19 / 100 = 19.0 % improve to 80/100 Baseline:  Goal status: INITIAL  2.  Improve L knee flexibility to 0 to 120 Baseline: flex NT, ext 0 Goal status: INITIAL  3.  L LE strength 5/5 for normal transfers,functional gait Baseline:  Goal status: INITIAL  4.  30 sec sit to stand 14 reps or greater for normal transfers, gait Baseline:  Goal status: INITIAL    PLAN:  PT FREQUENCY: 2x/week  PT DURATION: 8 weeks  PLANNED INTERVENTIONS: 97110-Therapeutic exercises, 97530- Therapeutic activity, 97112- Neuromuscular re-education, 97535- Self Care, 47829- Manual therapy, and 97014- Electrical stimulation (unattended)  PLAN FOR NEXT SESSION: Initiate ROM L knee  Alger Kerstein L Yulissa Needham, PT, DPT, OCS 11/30/2022, 5:14 PM

## 2022-12-05 ENCOUNTER — Ambulatory Visit: Payer: Medicare Other | Attending: Orthopedic Surgery

## 2022-12-05 ENCOUNTER — Other Ambulatory Visit: Payer: Self-pay

## 2022-12-05 DIAGNOSIS — M25562 Pain in left knee: Secondary | ICD-10-CM | POA: Diagnosis present

## 2022-12-05 DIAGNOSIS — R262 Difficulty in walking, not elsewhere classified: Secondary | ICD-10-CM | POA: Insufficient documentation

## 2022-12-05 DIAGNOSIS — M25662 Stiffness of left knee, not elsewhere classified: Secondary | ICD-10-CM | POA: Diagnosis present

## 2022-12-05 DIAGNOSIS — G8929 Other chronic pain: Secondary | ICD-10-CM | POA: Insufficient documentation

## 2022-12-05 NOTE — Therapy (Signed)
OUTPATIENT PHYSICAL THERAPY LOWER EXTREMITY    Patient Name: Toni [name] MRN: [id number] DOB:Mar 03, 1955, 67 y.o., female Today's Date: 12/05/2022  END OF SESSION:  PT End of Session - 12/05/22 1528     Visit Number 3    Date for PT Re-Evaluation 01/23/23    Progress Note Due on Visit 10    PT Start Time 1515    PT Stop Time 1600    PT Time Calculation (min) 45 min    Activity Tolerance Patient tolerated treatment well;Patient limited by pain    Behavior During Therapy [facility] for tasks assessed/performed;Anxious               Past Medical History:  Diagnosis Date   Brachial neuritis or radiculitis NOS    Chronic hepatitis C (HCC) 10/05/2015   treated and rsolved   Depressive disorder, not elsewhere classified    Essential and other specified forms of tremor    Fracture of 5th metatarsal 08/13/2017   Fracture of multiple pubic rami (HCC) 08/13/2017   Headache(784.0)    Hepatitis C infection 12/05/2017   Treated and now resolved and "cured " as of early 2018  And      Memory loss    better see evaluation   Microscopic hematuria    seed dr Patsi Sears in past   Migraine, unspecified, without mention of intractable migraine without mention of status migrainosus    Multiple rib fractures 08/13/2017   Other specified disorders of rotator cuff syndrome of shoulder and allied disorders    Vulvar pain    rx with valium supp   Past Surgical History:  Procedure Laterality Date   BREAST EXCISIONAL BIOPSY Right    No scar   CESAREAN SECTION     x3   SHOULDER ARTHROSCOPY W/ ROTATOR CUFF REPAIR Right 2004   SHOULDER ARTHROSCOPY W/ ROTATOR CUFF REPAIR Left 2008   Patient Active Problem List   Diagnosis Date Noted   Easy bruising 03/21/2022   Common migraine with intractable migraine 12/10/2019   Chronic pain 08/13/2017   Occipital neuralgia of right side 10/21/2016   Chronic migraine 01/26/2015   Cervicogenic headache 12/16/2014   Subclinical hypothyroidism  12/16/2014   Memory loss 10/08/2014   Depression 10/08/2014   Generalized anxiety disorder 10/08/2014   Tremor 10/08/2014   Major depressive disorder, single episode, moderate (HCC) 08/27/2014   Memory difficulties 08/27/2014   Degenerative disc disease, cervical 08/27/2014   Chronically on opiate therapy 08/27/2014    PCP: Madelin Headings  REFERRING PROVIDER: Duwayne Heck, MD, [facility]  REFERRING DIAG: s/p arthroscopy , partial lateral meniscectomy, chrondroplasty L medial femoral condyle, medial meniscus repair.   THERAPY DIAG:  Difficulty in walking, not elsewhere classified  Chronic pain of left knee  Decreased ROM of left knee  Rationale for Evaluation and Treatment: Rehabilitation  ONSET DATE: 11/21/22  SUBJECTIVE:   SUBJECTIVE STATEMENT: I have been able to obtain help with transportation, also my ex husband is coming to help me as I recover so I will have some help  EVAL:The patient has many concerns:  having to put more weight on L Leg than she is supposed to, poor fit of L DonJoy brace, unable to reach her L foot to don/doff sock, unable to manage the brace by herself, lots of pain and bruising L ankle lat and med knee, some from the brace  PERTINENT HISTORY: Patient with chronic L knee pain, underwent surgery as described above, referred for outpt PT PAIN:  Are you having pain? Yes: NPRS scale: 7/10 Pain location: L knee, lower leg, ankle Pain description: constant Aggravating factors: the brace rubs, walking, weight bearing Relieving factors: elevating leg  PRECAUTIONS: Other: 50% weight bearing, until 2 weeks post op, Dec 05, 2022, no weight bearing flexion L knee until 6 weeks 01/02/23, ROM out of the brace 2 weeks post op, Dec 05, 2022  RED FLAGS: None   WEIGHT BEARING RESTRICTIONS: Yes 50% for initial 6 weeks, to 01/02/23  FALLS:  Has patient fallen in last 6 months? Yes. Number of falls unclear  LIVING ENVIRONMENT: Lives with: lives  alone Lives in: House/apartment Stairs: No Has following equipment at home: Single point cane and Environmental consultant - 2 wheeled  OCCUPATION: retired   PLOF: Independent with basic ADLs  PATIENT GOALS: get to full independence , able to maintain I at  my town home  NEXT MD VISIT: Dec 07 2022  OBJECTIVE:  Note: Objective measures were completed at Evaluation unless otherwise noted.  DIAGNOSTIC FINDINGS: na, see op note in media  PATIENT SURVEYS:  Tegner Lysholm Knee Score: 19 / 100 = 19.0 %   COGNITION: Overall cognitive status: Within functional limits for tasks assessed     SENSATION: WFL  EDEMA:  Mild edema, with multiple healing hematomas entire L LE    POSTURE: rounded shoulders, forward head, and Don joy brace  L LE, brace 2" too low, distally positioned.  Has ace wrap under brace with multiple areas creating indentation skin L leg  PALPATION: Tender along entire L tibia, with patellar mobs, medial and lateral thigh. No warmth or redness noted skin  LOWER EXTREMITY [id number] to assess L knee ROM due to surgical restrictions   LOWER EXTREMITY MMT: able to contract L quads, able to perform SLR within the brace.   LOWER EXTREMITY SPECIAL TESTS:  na  FUNCTIONAL TESTS:  Patient able to get on and off treatment table I and able to move sit to stand with r walker with SBA .  Very slow, guarded with all movements  GAIT: Distance walked: 68' in clinic Assistive device utilized: Walker - 2 wheeled Level of assistance: Modified independence Comments: patient is supposed to be 50% wbing, unclear whether she is really adhering to this restriction.  Walks with step to pattern with walker, leading with L LE, L knee extended in immobilizer   TODAY'S TREATMENT:                                                                                                                              DATE:  12/05/22:  Supine for heel slides, L LE, to tolerance 10 reps, measured 73 degrees Added  flat sheet around L  foot, for assisted heel slides, pt to replicate at home Supine quad sets L with towel roll under knee for tactile cuing SLR L , no brace, able to manage without lag, over 10 reps Seated L knee flexion, pt using flat sheet  to support L foot Standing with brace on for heel/toe rocks Standing with brace within walker for L hip flex and abd All ex x 10 reps.  Applied ice R ant knee after therex to alleviate edema and soreness.  11/30/22:  Therapeutic activities:   Spent a good portion of today's session repositioning the Donjoy  brace and trying to make it more comfortable, removed the ace wrap and added non adherent guaze that the pt brought with her to cover her steristrips and sensitive area L medial thigh, then repositioned the splint.  Therex:  pt performed quad sets, with brace open and L leg resting on table, also SLR with and without the brace Patient instructed in and performed ankle pumps with blue theraband. 15 x , provided with band to use at home.    11/28/22: Evaluation, primarily spent most of time repositioning and educating the pt in correct positioning of the [facility] joy splint, marked correct position with marker on pts leg for her. Inst in quad sets within brace.  Called orthopedist for specific precautions regarding pts progression with PT    PATIENT EDUCATION:  Education details: POC, goals Person educated: Patient Education method: Explanation, Demonstration, and Tactile cues Education comprehension: verbalized understanding, returned demonstration, and verbal cues required  HOME EXERCISE PROGRAM: Inst in correct position of brace . And in quad sets   ASSESSMENT:  CLINICAL IMPRESSION: Patient is a 67 y.o. female who participated in 3rd skilled physical therapy session due to L meniscus tear and medial femoral condyle debridement.   Able to progress today per surgeon's protocol to L knee Rom out of the brace, so initiated with her.  She also is  cleared for full weight bearing at this point.  She  still has difficulty with positioning and discomfort from the brace, it slides down consistently.  Tolerated today's session well, again able to perform SLR very well without lag . Will be going to surgeon for follow up appt tomorrow.   Needs to advance as tolerated and within surgeon's guidelines regarding strengthening activities R LE. OBJECTIVE IMPAIRMENTS: decreased activity tolerance, decreased balance, decreased endurance, decreased knowledge of condition, decreased mobility, difficulty walking, decreased ROM, decreased strength, increased edema, impaired perceived functional ability, impaired flexibility, and pain.   ACTIVITY LIMITATIONS: carrying, lifting, bending, standing, squatting, and stairs  PARTICIPATION LIMITATIONS: meal prep, cleaning, laundry, driving, shopping, community activity, and yard work  PERSONAL FACTORS: Behavior pattern, Education, Fitness, Past/current experiences, and 3+ comorbidities: major depressive disorder, chronic hepititis, multiple hip and shoulder injuries and arthroscopic repairs  are also affecting patient's functional outcome.   REHAB POTENTIAL: Fair    CLINICAL DECISION MAKING: Evolving/moderate complexity  EVALUATION COMPLEXITY: Moderate   GOALS: Goals reviewed with patient? Yes  SHORT TERM GOALS: Target date: 2 weeks 12/05/22 I HEP Baseline: Goal status: INITIAL   LONG TERM GOALS: Target date: 01/23/23  Tegner Lysholm Knee Score: 19 / 100 = 19.0 % improve to 80/100 Baseline:  Goal status: INITIAL  2.  Improve L knee flexibility to 0 to 120 Baseline: flex NT, ext 0 Goal status: INITIAL  3.  L LE strength 5/5 for normal transfers,functional gait Baseline:  Goal status: INITIAL  4.  30 sec sit to stand 14 reps or greater for normal transfers, gait Baseline:  Goal status: INITIAL    PLAN:  PT FREQUENCY: 2x/week  PT DURATION: 8 weeks  PLANNED INTERVENTIONS: 97110-Therapeutic  exercises, 97530- Therapeutic activity, 97112- Neuromuscular re-education, 97535- Self Care, 16109- Manual therapy, and 97014- Electrical  stimulation (unattended)  PLAN FOR NEXT SESSION: continue ROM L knee advance more strengthening  Tymesha Ditmore L Othniel Maret, PT, DPT, OCS 12/05/2022, 4:08 PM

## 2022-12-07 NOTE — Therapy (Signed)
OUTPATIENT PHYSICAL THERAPY LOWER EXTREMITY    Patient Name: Toni [name] MRN: [id number] DOB:02-Mar-1955, 67 y.o., female Today's Date: 12/08/2022  END OF SESSION:  PT End of Session - 12/08/22 1311     Visit Number 4    Date for PT Re-Evaluation 01/23/23    Progress Note Due on Visit 10    PT Start Time 1310    PT Stop Time 1355    PT Time Calculation (min) 45 min    Activity Tolerance Patient tolerated treatment well;Patient limited by pain    Behavior During Therapy [facility] for tasks assessed/performed;Anxious                Past Medical History:  Diagnosis Date   Brachial neuritis or radiculitis NOS    Chronic hepatitis C (HCC) 10/05/2015   treated and rsolved   Depressive disorder, not elsewhere classified    Essential and other specified forms of tremor    Fracture of 5th metatarsal 08/13/2017   Fracture of multiple pubic rami (HCC) 08/13/2017   Headache(784.0)    Hepatitis C infection 12/05/2017   Treated and now resolved and "cured " as of early 2018  And      Memory loss    better see evaluation   Microscopic hematuria    seed dr Patsi Sears in past   Migraine, unspecified, without mention of intractable migraine without mention of status migrainosus    Multiple rib fractures 08/13/2017   Other specified disorders of rotator cuff syndrome of shoulder and allied disorders    Vulvar pain    rx with valium supp   Past Surgical History:  Procedure Laterality Date   BREAST EXCISIONAL BIOPSY Right    No scar   CESAREAN SECTION     x3   SHOULDER ARTHROSCOPY W/ ROTATOR CUFF REPAIR Right 2004   SHOULDER ARTHROSCOPY W/ ROTATOR CUFF REPAIR Left 2008   Patient Active Problem List   Diagnosis Date Noted   Easy bruising 03/21/2022   Common migraine with intractable migraine 12/10/2019   Chronic pain 08/13/2017   Occipital neuralgia of right side 10/21/2016   Chronic migraine 01/26/2015   Cervicogenic headache 12/16/2014   Subclinical  hypothyroidism 12/16/2014   Memory loss 10/08/2014   Depression 10/08/2014   Generalized anxiety disorder 10/08/2014   Tremor 10/08/2014   Major depressive disorder, single episode, moderate (HCC) 08/27/2014   Memory difficulties 08/27/2014   Degenerative disc disease, cervical 08/27/2014   Chronically on opiate therapy 08/27/2014    PCP: Madelin Headings  REFERRING PROVIDER: Duwayne Heck, MD, [facility]  REFERRING DIAG: s/p arthroscopy , partial lateral meniscectomy, chrondroplasty L medial femoral condyle, medial meniscus repair.   THERAPY DIAG:  Difficulty in walking, not elsewhere classified  Chronic pain of left knee  Decreased ROM of left knee  Rationale for Evaluation and Treatment: Rehabilitation  ONSET DATE: 11/21/22  SUBJECTIVE:   SUBJECTIVE STATEMENT: saw the doctor and they took the brace off. She said 50% weight bearing for the next 2 weeks.   EVAL:The patient has many concerns:  having to put more weight on L Leg than she is supposed to, poor fit of L DonJoy brace, unable to reach her L foot to don/doff sock, unable to manage the brace by herself, lots of pain and bruising L ankle lat and med knee, some from the brace  PERTINENT HISTORY: Patient with chronic L knee pain, underwent surgery as described above, referred for outpt PT PAIN:  Are you having pain? Yes: NPRS  scale: 7/10 Pain location: L knee, lower leg, ankle Pain description: constant Aggravating factors: the brace rubs, walking, weight bearing Relieving factors: elevating leg  PRECAUTIONS: Other: 50% weight bearing, until 2 weeks post op, Dec 05, 2022, no weight bearing flexion L knee until 6 weeks 01/02/23, ROM out of the brace 2 weeks post op, Dec 05, 2022  RED FLAGS: None   WEIGHT BEARING RESTRICTIONS: Yes 50% for initial 6 weeks, to 01/02/23  FALLS:  Has patient fallen in last 6 months? Yes. Number of falls unclear  LIVING ENVIRONMENT: Lives with: lives alone Lives in:  House/apartment Stairs: No Has following equipment at home: Single point cane and Environmental consultant - 2 wheeled  OCCUPATION: retired   PLOF: Independent with basic ADLs  PATIENT GOALS: get to full independence , able to maintain I at  my town home  NEXT MD VISIT: Dec 07 2022  OBJECTIVE:  Note: Objective measures were completed at Evaluation unless otherwise noted.  DIAGNOSTIC FINDINGS: na, see op note in media  PATIENT SURVEYS:  Tegner Lysholm Knee Score: 19 / 100 = 19.0 %   COGNITION: Overall cognitive status: Within functional limits for tasks assessed     SENSATION: WFL  EDEMA:  Mild edema, with multiple healing hematomas entire L LE    POSTURE: rounded shoulders, forward head, and Don joy brace  L LE, brace 2" too low, distally positioned.  Has ace wrap under brace with multiple areas creating indentation skin L leg  PALPATION: Tender along entire L tibia, with patellar mobs, medial and lateral thigh. No warmth or redness noted skin  LOWER EXTREMITY [id number] to assess L knee ROM due to surgical restrictions   LOWER EXTREMITY MMT: able to contract L quads, able to perform SLR within the brace.   LOWER EXTREMITY SPECIAL TESTS:  na  FUNCTIONAL TESTS:  Patient able to get on and off treatment table I and able to move sit to stand with r walker with SBA .  Very slow, guarded with all movements  GAIT: Distance walked: 54' in clinic Assistive device utilized: Walker - 2 wheeled Level of assistance: Modified independence Comments: patient is supposed to be 50% wbing, unclear whether she is really adhering to this restriction.  Walks with step to pattern with walker, leading with L LE, L knee extended in immobilizer   TODAY'S TREATMENT:                                                                                                                              DATE:  12/08/22 LAQ 2# 2x10  HS curls green 2x10  Fitter pushes 2 blue band 2x10 STS x10 SAQ 2x10 SLR  2x10 NuStep L4 x6mins    12/05/22:  Supine for heel slides, L LE, to tolerance 10 reps, measured 73 degrees Added flat sheet around L  foot, for assisted heel slides, pt to replicate at home Supine quad sets L with towel roll under knee for tactile  cuing SLR L , no brace, able to manage without lag, over 10 reps Seated L knee flexion, pt using flat sheet to support L foot Standing with brace on for heel/toe rocks Standing with brace within walker for L hip flex and abd All ex x 10 reps.  Applied ice R ant knee after therex to alleviate edema and soreness.  11/30/22:  Therapeutic activities:   Spent a good portion of today's session repositioning the Donjoy  brace and trying to make it more comfortable, removed the ace wrap and added non adherent guaze that the pt brought with her to cover her steristrips and sensitive area L medial thigh, then repositioned the splint.  Therex:  pt performed quad sets, with brace open and L leg resting on table, also SLR with and without the brace Patient instructed in and performed ankle pumps with blue theraband. 15 x , provided with band to use at home.    11/28/22: Evaluation, primarily spent most of time repositioning and educating the pt in correct positioning of the [facility] joy splint, marked correct position with marker on pts leg for her. Inst in quad sets within brace.  Called orthopedist for specific precautions regarding pts progression with PT    PATIENT EDUCATION:  Education details: POC, goals Person educated: Patient Education method: Explanation, Demonstration, and Tactile cues Education comprehension: verbalized understanding, returned demonstration, and verbal cues required  HOME EXERCISE PROGRAM: Inst in correct position of brace . And in quad sets   ASSESSMENT:  CLINICAL IMPRESSION: Patient is a 67 y.o. female who participated in skilled physical therapy session due to L meniscus tear and medial femoral condyle debridement.   Her surgeon took the brace off and said she is able to progress with ROM. Has put her on 50% weight bearing for 2 weeks. Tolerated today's session well, working on more progressive strengthening.  She was able to demonstrate 100d of active knee flexion in sitting. Has a hard time relaxing her leg when sitting, was advised that if she is sitting to let her leg hang to avoid overworking the quad muscle.   OBJECTIVE IMPAIRMENTS: decreased activity tolerance, decreased balance, decreased endurance, decreased knowledge of condition, decreased mobility, difficulty walking, decreased ROM, decreased strength, increased edema, impaired perceived functional ability, impaired flexibility, and pain.   ACTIVITY LIMITATIONS: carrying, lifting, bending, standing, squatting, and stairs  PARTICIPATION LIMITATIONS: meal prep, cleaning, laundry, driving, shopping, community activity, and yard work  PERSONAL FACTORS: Behavior pattern, Education, Fitness, Past/current experiences, and 3+ comorbidities: major depressive disorder, chronic hepititis, multiple hip and shoulder injuries and arthroscopic repairs  are also affecting patient's functional outcome.   REHAB POTENTIAL: Fair    CLINICAL DECISION MAKING: Evolving/moderate complexity  EVALUATION COMPLEXITY: Moderate   GOALS: Goals reviewed with patient? Yes  SHORT TERM GOALS: Target date: 2 weeks 12/05/22 I HEP Baseline: Goal status: INITIAL   LONG TERM GOALS: Target date: 01/23/23  Tegner Lysholm Knee Score: 19 / 100 = 19.0 % improve to 80/100 Baseline:  Goal status: INITIAL  2.  Improve L knee flexibility to 0 to 120 Baseline: flex NT, ext 0 Goal status: INITIAL  3.  L LE strength 5/5 for normal transfers,functional gait Baseline:  Goal status: INITIAL  4.  30 sec sit to stand 14 reps or greater for normal transfers, gait Baseline:  Goal status: INITIAL    PLAN:  PT FREQUENCY: 2x/week  PT DURATION: 8 weeks  PLANNED INTERVENTIONS:  97110-Therapeutic exercises, 97530- Therapeutic activity, [id number]- Neuromuscular re-education, 97535- Self  Care, 16109- Manual therapy, and 97014- Electrical stimulation (unattended)  PLAN FOR NEXT SESSION: continue ROM L knee advance more strengthening  Cassie Freer, PT, DPT, OCS 12/08/2022, 1:50 PM

## 2022-12-08 ENCOUNTER — Ambulatory Visit: Payer: Medicare Other

## 2022-12-08 DIAGNOSIS — G8929 Other chronic pain: Secondary | ICD-10-CM

## 2022-12-08 DIAGNOSIS — R262 Difficulty in walking, not elsewhere classified: Secondary | ICD-10-CM | POA: Diagnosis not present

## 2022-12-08 DIAGNOSIS — M25662 Stiffness of left knee, not elsewhere classified: Secondary | ICD-10-CM

## 2022-12-12 ENCOUNTER — Other Ambulatory Visit: Payer: Self-pay

## 2022-12-12 ENCOUNTER — Ambulatory Visit: Payer: Medicare Other

## 2022-12-12 DIAGNOSIS — G8929 Other chronic pain: Secondary | ICD-10-CM

## 2022-12-12 DIAGNOSIS — R262 Difficulty in walking, not elsewhere classified: Secondary | ICD-10-CM

## 2022-12-12 DIAGNOSIS — M25662 Stiffness of left knee, not elsewhere classified: Secondary | ICD-10-CM

## 2022-12-12 NOTE — Therapy (Signed)
OUTPATIENT PHYSICAL THERAPY LOWER EXTREMITY    Patient Name: Toni [name] MRN: [id number] DOB:August 14, 1955, 67 y.o., female Today's Date: 12/12/2022  END OF SESSION:  PT End of Session - 12/12/22 1422     Visit Number 5    Date for PT Re-Evaluation 01/23/23    Progress Note Due on Visit 10    PT Start Time 1423    PT Stop Time 1510    PT Time Calculation (min) 47 min    Activity Tolerance Patient tolerated treatment well;Patient limited by pain    Behavior During Therapy [facility] for tasks assessed/performed;Anxious                 Past Medical History:  Diagnosis Date   Brachial neuritis or radiculitis NOS    Chronic hepatitis C (HCC) 10/05/2015   treated and rsolved   Depressive disorder, not elsewhere classified    Essential and other specified forms of tremor    Fracture of 5th metatarsal 08/13/2017   Fracture of multiple pubic rami (HCC) 08/13/2017   Headache(784.0)    Hepatitis C infection 12/05/2017   Treated and now resolved and "cured " as of early 2018  And      Memory loss    better see evaluation   Microscopic hematuria    seed dr Patsi Sears in past   Migraine, unspecified, without mention of intractable migraine without mention of status migrainosus    Multiple rib fractures 08/13/2017   Other specified disorders of rotator cuff syndrome of shoulder and allied disorders    Vulvar pain    rx with valium supp   Past Surgical History:  Procedure Laterality Date   BREAST EXCISIONAL BIOPSY Right    No scar   CESAREAN SECTION     x3   SHOULDER ARTHROSCOPY W/ ROTATOR CUFF REPAIR Right 2004   SHOULDER ARTHROSCOPY W/ ROTATOR CUFF REPAIR Left 2008   Patient Active Problem List   Diagnosis Date Noted   Easy bruising 03/21/2022   Common migraine with intractable migraine 12/10/2019   Chronic pain 08/13/2017   Occipital neuralgia of right side 10/21/2016   Chronic migraine 01/26/2015   Cervicogenic headache 12/16/2014   Subclinical  hypothyroidism 12/16/2014   Memory loss 10/08/2014   Depression 10/08/2014   Generalized anxiety disorder 10/08/2014   Tremor 10/08/2014   Major depressive disorder, single episode, moderate (HCC) 08/27/2014   Memory difficulties 08/27/2014   Degenerative disc disease, cervical 08/27/2014   Chronically on opiate therapy 08/27/2014    PCP: Madelin Headings  REFERRING PROVIDER: Duwayne Heck, MD, [facility]  REFERRING DIAG: s/p arthroscopy , partial lateral meniscectomy, chrondroplasty L medial femoral condyle, medial meniscus repair.   THERAPY DIAG:  Difficulty in walking, not elsewhere classified  Chronic pain of left knee  Decreased ROM of left knee  Rationale for Evaluation and Treatment: Rehabilitation  ONSET DATE: 11/21/22  SUBJECTIVE:   SUBJECTIVE STATEMENT: saw the doctor and they took the brace off. She said 50% weight bearing for the next 2 weeks.   EVAL:The patient has many concerns:  having to put more weight on L Leg than she is supposed to, poor fit of L DonJoy brace, unable to reach her L foot to don/doff sock, unable to manage the brace by herself, lots of pain and bruising L ankle lat and med knee, some from the brace  PERTINENT HISTORY: Patient with chronic L knee pain, underwent surgery as described above, referred for outpt PT PAIN:  Are you having pain? Yes:  NPRS scale: 7/10 Pain location: L knee, lower leg, ankle Pain description: constant Aggravating factors: the brace rubs, walking, weight bearing Relieving factors: elevating leg  PRECAUTIONS: Other: 50% weight bearing, until 2 weeks post op, Dec 05, 2022, no weight bearing flexion L knee until 6 weeks 01/02/23, ROM out of the brace 2 weeks post op, Dec 05, 2022  RED FLAGS: None   WEIGHT BEARING RESTRICTIONS: Yes 50% for initial 6 weeks, to 01/02/23  FALLS:  Has patient fallen in last 6 months? Yes. Number of falls unclear  LIVING ENVIRONMENT: Lives with: lives alone Lives in:  House/apartment Stairs: No Has following equipment at home: Single point cane and Environmental consultant - 2 wheeled  OCCUPATION: retired   PLOF: Independent with basic ADLs  PATIENT GOALS: get to full independence , able to maintain I at  my town home  NEXT MD VISIT: Dec 07 2022  OBJECTIVE:  Note: Objective measures were completed at Evaluation unless otherwise noted.  DIAGNOSTIC FINDINGS: na, see op note in media  PATIENT SURVEYS:  Tegner Lysholm Knee Score: 19 / 100 = 19.0 %   COGNITION: Overall cognitive status: Within functional limits for tasks assessed     SENSATION: WFL  EDEMA:  Mild edema, with multiple healing hematomas entire L LE    POSTURE: rounded shoulders, forward head, and Don joy brace  L LE, brace 2" too low, distally positioned.  Has ace wrap under brace with multiple areas creating indentation skin L leg  PALPATION: Tender along entire L tibia, with patellar mobs, medial and lateral thigh. No warmth or redness noted skin  LOWER EXTREMITY WUJ:[id number] to assess L knee ROM due to surgical restrictions   LOWER EXTREMITY MMT: able to contract L quads, able to perform SLR within the brace.   LOWER EXTREMITY SPECIAL TESTS:  na  FUNCTIONAL TESTS:  Patient able to get on and off treatment table I and able to move sit to stand with r walker with SBA .  Very slow, guarded with all movements  GAIT: Distance walked: 33' in clinic Assistive device utilized: Walker - 2 wheeled Level of assistance: Modified independence Comments: patient is supposed to be 50% wbing, unclear whether she is really adhering to this restriction.  Walks with step to pattern with walker, leading with L LE, L knee extended in immobilizer   TODAY'S TREATMENT:                                                                                                                              DATE:  12/12/22: Nustep L5 LE's only Supine SLR foot up Supine SLR L LE ER 30 degrees Seated LAQ's 3#  Seated  hamstring curls green, stopped after 2 attempts, 5 reps each, due to pain L knee medial jt line Standing for L LE 3 way hip 2# ankle wt L LE, 10 reps each Heel/ toe rocks on airex in ll bars to maintain PWB L Heel raises with  ball squeeze ankles to engage medial stabilizers Heel /toe raises with isometric resistance from red t band around ankles 20 reps, hands on bars to maintain PWB  Stair training, practiced up /down 3 steps in clinic x 2, instructed in leading down with L, up with R to avoid weight bearing in flexion on L knee. 12/08/22 LAQ 2# 2x10  HS curls green 2x10  Fitter pushes 2 blue band 2x10 STS x10 SAQ 2x10 SLR 2x10 NuStep L4 x13mins    12/05/22:  Supine for heel slides, L LE, to tolerance 10 reps, measured 73 degrees Added flat sheet around L  foot, for assisted heel slides, pt to replicate at home Supine quad sets L with towel roll under knee for tactile cuing SLR L , no brace, able to manage without lag, over 10 reps Seated L knee flexion, pt using flat sheet to support L foot Standing with brace on for heel/toe rocks Standing with brace within walker for L hip flex and abd All ex x 10 reps.  Applied ice R ant knee after therex to alleviate edema and soreness.  11/30/22:  Therapeutic activities:   Spent a good portion of today's session repositioning the Donjoy  brace and trying to make it more comfortable, removed the ace wrap and added non adherent guaze that the pt brought with her to cover her steristrips and sensitive area L medial thigh, then repositioned the splint.  Therex:  pt performed quad sets, with brace open and L leg resting on table, also SLR with and without the brace Patient instructed in and performed ankle pumps with blue theraband. 15 x , provided with band to use at home.    11/28/22: Evaluation, primarily spent most of time repositioning and educating the pt in correct positioning of the [facility] joy splint, marked correct position with marker on  pts leg for her. Inst in quad sets within brace.  Called orthopedist for specific precautions regarding pts progression with PT    PATIENT EDUCATION:  Education details: POC, goals Person educated: Patient Education method: Explanation, Demonstration, and Tactile cues Education comprehension: verbalized understanding, returned demonstration, and verbal cues required  HOME EXERCISE PROGRAM: Inst in correct position of brace . And in quad sets   ASSESSMENT:  CLINICAL IMPRESSION: Patient is a 67 y.o. female who participated in skilled physical therapy session due to L meniscus tear and medial femoral condyle debridement.   Tolerated today's session well, working on more progressive strengthening, and proximal stability through hips.  She tolerated well.  Her L shoulder is getting painful due to use of assistive devices and straining with bed mobility. She has one more week of PWB L. OBJECTIVE IMPAIRMENTS: decreased activity tolerance, decreased balance, decreased endurance, decreased knowledge of condition, decreased mobility, difficulty walking, decreased ROM, decreased strength, increased edema, impaired perceived functional ability, impaired flexibility, and pain.   ACTIVITY LIMITATIONS: carrying, lifting, bending, standing, squatting, and stairs  PARTICIPATION LIMITATIONS: meal prep, cleaning, laundry, driving, shopping, community activity, and yard work  PERSONAL FACTORS: Behavior pattern, Education, Fitness, Past/current experiences, and 3+ comorbidities: major depressive disorder, chronic hepititis, multiple hip and shoulder injuries and arthroscopic repairs  are also affecting patient's functional outcome.   REHAB POTENTIAL: Fair    CLINICAL DECISION MAKING: Evolving/moderate complexity  EVALUATION COMPLEXITY: Moderate   GOALS: Goals reviewed with patient? Yes  SHORT TERM GOALS: Target date: 2 weeks 12/05/22 I HEP Baseline: Goal status: INITIAL   LONG TERM GOALS: Target  date: 01/23/23  Tegner Lysholm Knee Score:  19 / 100 = 19.0 % improve to 80/100 Baseline:  Goal status: INITIAL  2.  Improve L knee flexibility to 0 to 120 Baseline: flex NT, ext 0 Goal status: INITIAL  3.  L LE strength 5/5 for normal transfers,functional gait Baseline:  Goal status: INITIAL  4.  30 sec sit to stand 14 reps or greater for normal transfers, gait Baseline:  Goal status: INITIAL    PLAN:  PT FREQUENCY: 2x/week  PT DURATION: 8 weeks  PLANNED INTERVENTIONS: 97110-Therapeutic exercises, 97530- Therapeutic activity, 97112- Neuromuscular re-education, 97535- Self Care, 29562- Manual therapy, and 97014- Electrical stimulation (unattended)  PLAN FOR NEXT SESSION: continue ROM L knee advance more strengthening , stability training   Aaren Krog L Hadlee Burback, PT, DPT, OCS 12/12/2022, 3:18 PM

## 2022-12-14 ENCOUNTER — Ambulatory Visit: Payer: Medicare Other

## 2022-12-19 ENCOUNTER — Ambulatory Visit: Payer: Medicare Other

## 2022-12-19 ENCOUNTER — Other Ambulatory Visit: Payer: Self-pay

## 2022-12-19 DIAGNOSIS — R262 Difficulty in walking, not elsewhere classified: Secondary | ICD-10-CM

## 2022-12-19 DIAGNOSIS — G8929 Other chronic pain: Secondary | ICD-10-CM

## 2022-12-19 DIAGNOSIS — M25662 Stiffness of left knee, not elsewhere classified: Secondary | ICD-10-CM

## 2022-12-19 NOTE — Therapy (Signed)
OUTPATIENT PHYSICAL THERAPY LOWER EXTREMITY TREATMENT   Patient Name: Koralynn Suer MRN: [id number] DOB:01-02-1956, 67 y.o., female Today's Date: 12/19/2022  END OF SESSION:  PT End of Session - 12/19/22 1436     Visit Number 6    Date for PT Re-Evaluation 01/23/23    Progress Note Due on Visit 10    PT Start Time 1430    PT Stop Time 1512    PT Time Calculation (min) 42 min    Activity Tolerance Patient tolerated treatment well;Patient limited by pain    Behavior During Therapy [facility] for tasks assessed/performed;Anxious                  Past Medical History:  Diagnosis Date   Brachial neuritis or radiculitis NOS    Chronic hepatitis C (HCC) 10/05/2015   treated and rsolved   Depressive disorder, not elsewhere classified    Essential and other specified forms of tremor    Fracture of 5th metatarsal 08/13/2017   Fracture of multiple pubic rami (HCC) 08/13/2017   Headache(784.0)    Hepatitis C infection 12/05/2017   Treated and now resolved and "cured " as of early 2018  And      Memory loss    better see evaluation   Microscopic hematuria    seed dr Patsi Sears in past   Migraine, unspecified, without mention of intractable migraine without mention of status migrainosus    Multiple rib fractures 08/13/2017   Other specified disorders of rotator cuff syndrome of shoulder and allied disorders    Vulvar pain    rx with valium supp   Past Surgical History:  Procedure Laterality Date   BREAST EXCISIONAL BIOPSY Right    No scar   CESAREAN SECTION     x3   SHOULDER ARTHROSCOPY W/ ROTATOR CUFF REPAIR Right 2004   SHOULDER ARTHROSCOPY W/ ROTATOR CUFF REPAIR Left 2008   Patient Active Problem List   Diagnosis Date Noted   Easy bruising 03/21/2022   Common migraine with intractable migraine 12/10/2019   Chronic pain 08/13/2017   Occipital neuralgia of right side 10/21/2016   Chronic migraine 01/26/2015   Cervicogenic headache 12/16/2014    Subclinical hypothyroidism 12/16/2014   Memory loss 10/08/2014   Depression 10/08/2014   Generalized anxiety disorder 10/08/2014   Tremor 10/08/2014   Major depressive disorder, single episode, moderate (HCC) 08/27/2014   Memory difficulties 08/27/2014   Degenerative disc disease, cervical 08/27/2014   Chronically on opiate therapy 08/27/2014    PCP: Madelin Headings  REFERRING PROVIDER: Duwayne Heck, MD, [facility]  REFERRING DIAG: s/p arthroscopy , partial lateral meniscectomy, chrondroplasty L medial femoral condyle, medial meniscus repair.   THERAPY DIAG:  Difficulty in walking, not elsewhere classified  Chronic pain of left knee  Decreased ROM of left knee  Rationale for Evaluation and Treatment: Rehabilitation  ONSET DATE: 11/21/22  SUBJECTIVE:   SUBJECTIVE STATEMENT: saw the doctor and they took the brace off. She said 50% weight bearing for the next 2 weeks.   EVAL:The patient has many concerns:  having to put more weight on L Leg than she is supposed to, poor fit of L DonJoy brace, unable to reach her L foot to don/doff sock, unable to manage the brace by herself, lots of pain and bruising L ankle lat and med knee, some from the brace  PERTINENT HISTORY: Patient with chronic L knee pain, underwent surgery as described above, referred for outpt PT PAIN:  Are you having pain?  Yes: NPRS scale: 7/10 Pain location: L knee, lower leg, ankle Pain description: constant Aggravating factors: the brace rubs, walking, weight bearing Relieving factors: elevating leg  PRECAUTIONS: Other: 50% weight bearing, until 2 weeks post op, Dec 05, 2022, no weight bearing flexion L knee until 6 weeks 01/02/23, ROM out of the brace 2 weeks post op, Dec 05, 2022  RED FLAGS: None   WEIGHT BEARING RESTRICTIONS: Yes 50% for initial 6 weeks, to 01/02/23  FALLS:  Has patient fallen in last 6 months? Yes. Number of falls unclear  LIVING ENVIRONMENT: Lives with: lives alone Lives in:  House/apartment Stairs: No Has following equipment at home: Single point cane and Environmental consultant - 2 wheeled  OCCUPATION: retired   PLOF: Independent with basic ADLs  PATIENT GOALS: get to full independence , able to maintain I at  my town home  NEXT MD VISIT: Dec 07 2022  OBJECTIVE:  Note: Objective measures were completed at Evaluation unless otherwise noted.  DIAGNOSTIC FINDINGS: na, see op note in media  PATIENT SURVEYS:  Tegner Lysholm Knee Score: 19 / 100 = 19.0 %   COGNITION: Overall cognitive status: Within functional limits for tasks assessed     SENSATION: WFL  EDEMA:  Mild edema, with multiple healing hematomas entire L LE    POSTURE: rounded shoulders, forward head, and Don joy brace  L LE, brace 2" too low, distally positioned.  Has ace wrap under brace with multiple areas creating indentation skin L leg  PALPATION: Tender along entire L tibia, with patellar mobs, medial and lateral thigh. No warmth or redness noted skin  LOWER EXTREMITY ZOX:WRUEAV to assess L knee ROM due to surgical restrictions   LOWER EXTREMITY MMT: able to contract L quads, able to perform SLR within the brace.   LOWER EXTREMITY SPECIAL TESTS:  na  FUNCTIONAL TESTS:  Patient able to get on and off treatment table I and able to move sit to stand with r walker with SBA .  Very slow, guarded with all movements  GAIT: Distance walked: 50' in clinic Assistive device utilized: Walker - 2 wheeled Level of assistance: Modified independence Comments: patient is supposed to be 50% wbing, unclear whether she is really adhering to this restriction.  Walks with step to pattern with walker, leading with L LE, L knee extended in immobilizer   TODAY'S TREATMENT:                                                                                                                              DATE:  12/19/22: Nustep level 5 6 min LE only Gait training with st cane in R hand, with good pattern noted, mild  decreased L hip ext in mid and terminal stance Bknee ext , 5 # B up, L LE eccentric lowering 15 reps Seated B knee flexion 20# 15 reps Standing with 2# cuff weights each ankle for alt hip abd, alt hip flex, alt hip ext Standing  heel/ toe rocks on airex in ll bars Standing tandem with leading foot on airex with horizontal head turns for proprioceptive training  12/12/22: Nustep L5 LE's only Supine SLR foot up Supine SLR L LE ER 30 degrees Seated LAQ's 3#  Seated hamstring curls green, stopped after 2 attempts, 5 reps each, due to pain L knee medial jt line Standing for L LE 3 way hip 2# ankle wt L LE, 10 reps each Heel/ toe rocks on airex in ll bars to maintain PWB L Heel raises with ball squeeze ankles to engage medial stabilizers Heel /toe raises with isometric resistance from red t band around ankles 20 reps, hands on bars to maintain PWB  Stair training, practiced up /down 3 steps in clinic x 2, instructed in leading down with L, up with R to avoid weight bearing in flexion on L knee. 12/08/22 LAQ 2# 2x10  HS curls green 2x10  Fitter pushes 2 blue band 2x10 STS x10 SAQ 2x10 SLR 2x10 NuStep L4 x63mins    12/05/22:  Supine for heel slides, L LE, to tolerance 10 reps, measured 73 degrees Added flat sheet around L  foot, for assisted heel slides, pt to replicate at home Supine quad sets L with towel roll under knee for tactile cuing SLR L , no brace, able to manage without lag, over 10 reps Seated L knee flexion, pt using flat sheet to support L foot Standing with brace on for heel/toe rocks Standing with brace within walker for L hip flex and abd All ex x 10 reps.  Applied ice R ant knee after therex to alleviate edema and soreness.  11/30/22:  Therapeutic activities:   Spent a good portion of today's session repositioning the Donjoy  brace and trying to make it more comfortable, removed the ace wrap and added non adherent guaze that the pt brought with her to cover her  steristrips and sensitive area L medial thigh, then repositioned the splint.  Therex:  pt performed quad sets, with brace open and L leg resting on table, also SLR with and without the brace Patient instructed in and performed ankle pumps with blue theraband. 15 x , provided with band to use at home.    11/28/22: Evaluation, primarily spent most of time repositioning and educating the pt in correct positioning of the [facility] joy splint, marked correct position with marker on pts leg for her. Inst in quad sets within brace.  Called orthopedist for specific precautions regarding pts progression with PT    PATIENT EDUCATION:  Education details: POC, goals Person educated: Patient Education method: Explanation, Demonstration, and Tactile cues Education comprehension: verbalized understanding, returned demonstration, and verbal cues required  HOME EXERCISE PROGRAM: Inst in correct position of brace . And in quad sets   ASSESSMENT:  CLINICAL IMPRESSION: Patient is a 66 y.o. female who participated in skilled physical therapy session due to L meniscus tear and medial femoral condyle debridement.   Tolerated today's session well, working on more progressive strengthening, and proximal stability through hips. Progressed from 50% wb to full wb per surgeon's last instructions with st cane.  Continuing to  avoid weight bearing bending on L LE until end of this months, reviewed with pt again today.  She tolerated well.  Her L shoulder is still painful due to use of assistive devices and straining with bed mobility.   OBJECTIVE IMPAIRMENTS: decreased activity tolerance, decreased balance, decreased endurance, decreased knowledge of condition, decreased mobility, difficulty walking, decreased ROM, decreased strength,  increased edema, impaired perceived functional ability, impaired flexibility, and pain.   ACTIVITY LIMITATIONS: carrying, lifting, bending, standing, squatting, and stairs  PARTICIPATION  LIMITATIONS: meal prep, cleaning, laundry, driving, shopping, community activity, and yard work  PERSONAL FACTORS: Behavior pattern, Education, Fitness, Past/current experiences, and 3+ comorbidities: major depressive disorder, chronic hepititis, multiple hip and shoulder injuries and arthroscopic repairs  are also affecting patient's functional outcome.   REHAB POTENTIAL: Fair    CLINICAL DECISION MAKING: Evolving/moderate complexity  EVALUATION COMPLEXITY: Moderate   GOALS: Goals reviewed with patient? Yes  SHORT TERM GOALS: Target date: 2 weeks 12/05/22 I HEP Baseline: Goal status: INITIAL   LONG TERM GOALS: Target date: 01/23/23  Tegner Lysholm Knee Score: 19 / 100 = 19.0 % improve to 80/100 Baseline:  Goal status: INITIAL  2.  Improve L knee flexibility to 0 to 120 Baseline: flex NT, ext 0 Goal status: INITIAL  3.  L LE strength 5/5 for normal transfers,functional gait Baseline:  Goal status: INITIAL  4.  30 sec sit to stand 14 reps or greater for normal transfers, gait Baseline:  Goal status: INITIAL    PLAN:  PT FREQUENCY: 2x/week  PT DURATION: 8 weeks  PLANNED INTERVENTIONS: 97110-Therapeutic exercises, 97530- Therapeutic activity, 97112- Neuromuscular re-education, 97535- Self Care, 08657- Manual therapy, and 97014- Electrical stimulation (unattended)  PLAN FOR NEXT SESSION: continue ROM L knee advance more strengthening , stability training   See Beharry L Ashton Belote, PT, DPT, OCS 12/19/2022, 2:38 PM

## 2022-12-21 ENCOUNTER — Ambulatory Visit: Payer: Medicare Other

## 2022-12-21 DIAGNOSIS — M25662 Stiffness of left knee, not elsewhere classified: Secondary | ICD-10-CM

## 2022-12-21 DIAGNOSIS — G8929 Other chronic pain: Secondary | ICD-10-CM

## 2022-12-21 DIAGNOSIS — R262 Difficulty in walking, not elsewhere classified: Secondary | ICD-10-CM | POA: Diagnosis not present

## 2022-12-21 NOTE — Therapy (Signed)
OUTPATIENT PHYSICAL THERAPY LOWER EXTREMITY TREATMENT   Patient Name: Cornesha Hover MRN: [id number] DOB:1955/02/14, 67 y.o., female Today's Date: 12/21/2022  END OF SESSION:  PT End of Session - 12/21/22 1432     Visit Number 7                   Past Medical History:  Diagnosis Date   Brachial neuritis or radiculitis NOS    Chronic hepatitis C (HCC) 10/05/2015   treated and rsolved   Depressive disorder, not elsewhere classified    Essential and other specified forms of tremor    Fracture of 5th metatarsal 08/13/2017   Fracture of multiple pubic rami (HCC) 08/13/2017   Headache(784.0)    Hepatitis C infection 12/05/2017   Treated and now resolved and "cured " as of early 2018  And      Memory loss    better see evaluation   Microscopic hematuria    seed dr Patsi Sears in past   Migraine, unspecified, without mention of intractable migraine without mention of status migrainosus    Multiple rib fractures 08/13/2017   Other specified disorders of rotator cuff syndrome of shoulder and allied disorders    Vulvar pain    rx with valium supp   Past Surgical History:  Procedure Laterality Date   BREAST EXCISIONAL BIOPSY Right    No scar   CESAREAN SECTION     x3   SHOULDER ARTHROSCOPY W/ ROTATOR CUFF REPAIR Right 2004   SHOULDER ARTHROSCOPY W/ ROTATOR CUFF REPAIR Left 2008   Patient Active Problem List   Diagnosis Date Noted   Easy bruising 03/21/2022   Common migraine with intractable migraine 12/10/2019   Chronic pain 08/13/2017   Occipital neuralgia of right side 10/21/2016   Chronic migraine 01/26/2015   Cervicogenic headache 12/16/2014   Subclinical hypothyroidism 12/16/2014   Memory loss 10/08/2014   Depression 10/08/2014   Generalized anxiety disorder 10/08/2014   Tremor 10/08/2014   Major depressive disorder, single episode, moderate (HCC) 08/27/2014   Memory difficulties 08/27/2014   Degenerative disc disease, cervical 08/27/2014    Chronically on opiate therapy 08/27/2014    PCP: Madelin Headings  REFERRING PROVIDER: Duwayne Heck, MD, [facility]  REFERRING DIAG: s/p arthroscopy , partial lateral meniscectomy, chrondroplasty L medial femoral condyle, medial meniscus repair.   THERAPY DIAG:  No diagnosis found.  Rationale for Evaluation and Treatment: Rehabilitation  ONSET DATE: 11/21/22  SUBJECTIVE:   SUBJECTIVE STATEMENT: not sleeping well , increased pain , have been on my feet a lot and up and down because getting ready for my family to come in for holidays  EVAL:The patient has many concerns:  having to put more weight on L Leg than she is supposed to, poor fit of L DonJoy brace, unable to reach her L foot to don/doff sock, unable to manage the brace by herself, lots of pain and bruising L ankle lat and med knee, some from the brace  PERTINENT HISTORY: Patient with chronic L knee pain, underwent surgery as described above, referred for outpt PT PAIN:  Are you having pain? Yes: NPRS scale: 7/10 Pain location: L knee, lower leg, ankle Pain description: constant Aggravating factors: the brace rubs, walking, weight bearing Relieving factors: elevating leg  PRECAUTIONS: Other: 50% weight bearing, until 2 weeks post op, Dec 05, 2022, no weight bearing flexion L knee until 6 weeks 01/02/23, ROM out of the brace 2 weeks post op, Dec 05, 2022  RED FLAGS: None  WEIGHT BEARING RESTRICTIONS: Yes 50% for initial 6 weeks, to 01/02/23  FALLS:  Has patient fallen in last 6 months? Yes. Number of falls unclear  LIVING ENVIRONMENT: Lives with: lives alone Lives in: House/apartment Stairs: No Has following equipment at home: Single point cane and Environmental consultant - 2 wheeled  OCCUPATION: retired   PLOF: Independent with basic ADLs  PATIENT GOALS: get to full independence , able to maintain I at  my town home  NEXT MD VISIT: Dec 07 2022  OBJECTIVE:  Note: Objective measures were completed at Evaluation unless  otherwise noted.  DIAGNOSTIC FINDINGS: na, see op note in media  PATIENT SURVEYS:  Tegner Lysholm Knee Score: 19 / 100 = 19.0 %   COGNITION: Overall cognitive status: Within functional limits for tasks assessed     SENSATION: WFL  EDEMA:  Mild edema, with multiple healing hematomas entire L LE    POSTURE: rounded shoulders, forward head, and Don joy brace  L LE, brace 2" too low, distally positioned.  Has ace wrap under brace with multiple areas creating indentation skin L leg  PALPATION: Tender along entire L tibia, with patellar mobs, medial and lateral thigh. No warmth or redness noted skin  LOWER EXTREMITY ZOX:WRUEAV to assess L knee ROM due to surgical restrictions   LOWER EXTREMITY MMT: able to contract L quads, able to perform SLR within the brace.   LOWER EXTREMITY SPECIAL TESTS:  na  FUNCTIONAL TESTS:  Patient able to get on and off treatment table I and able to move sit to stand with r walker with SBA .  Very slow, guarded with all movements  GAIT: Distance walked: 24' in clinic Assistive device utilized: Walker - 2 wheeled Level of assistance: Modified independence Comments: patient is supposed to be 50% wbing, unclear whether she is really adhering to this restriction.  Walks with step to pattern with walker, leading with L LE, L knee extended in immobilizer   TODAY'S TREATMENT:                                                                                                                              DATE:  12/21/22:  Gait with st cane, utilizing some circumduction L LE, Nustep level 3, managed for 3 min then stopped due to pain Supine for NMES 4 pads, [place], L distal quads, in combination with quad sets over small 1/2 roller Supine for vasopneumatic x 10 min with L LE elevated on wedge  12/19/22: Nustep level 5 6 min LE only Gait training with st cane in R hand, with good pattern noted, mild decreased L hip ext in mid and terminal stance Bknee ext  , 5 # B up, L LE eccentric lowering 15 reps Seated B knee flexion 20# 15 reps Standing with 2# cuff weights each ankle for alt hip abd, alt hip flex, alt hip ext Standing heel/ toe rocks on airex in ll bars Standing tandem with leading foot on  airex with horizontal head turns for proprioceptive training  12/12/22: Nustep L5 LE's only Supine SLR foot up Supine SLR L LE ER 30 degrees Seated LAQ's 3#  Seated hamstring curls green, stopped after 2 attempts, 5 reps each, due to pain L knee medial jt line Standing for L LE 3 way hip 2# ankle wt L LE, 10 reps each Heel/ toe rocks on airex in ll bars to maintain PWB L Heel raises with ball squeeze ankles to engage medial stabilizers Heel /toe raises with isometric resistance from red t band around ankles 20 reps, hands on bars to maintain PWB  Stair training, practiced up /down 3 steps in clinic x 2, instructed in leading down with L, up with R to avoid weight bearing in flexion on L knee. 12/08/22 LAQ 2# 2x10  HS curls green 2x10  Fitter pushes 2 blue band 2x10 STS x10 SAQ 2x10 SLR 2x10 NuStep L4 x35mins    12/05/22:  Supine for heel slides, L LE, to tolerance 10 reps, measured 73 degrees Added flat sheet around L  foot, for assisted heel slides, pt to replicate at home Supine quad sets L with towel roll under knee for tactile cuing SLR L , no brace, able to manage without lag, over 10 reps Seated L knee flexion, pt using flat sheet to support L foot Standing with brace on for heel/toe rocks Standing with brace within walker for L hip flex and abd All ex x 10 reps.  Applied ice R ant knee after therex to alleviate edema and soreness.  11/30/22:  Therapeutic activities:   Spent a good portion of today's session repositioning the Donjoy  brace and trying to make it more comfortable, removed the ace wrap and added non adherent guaze that the pt brought with her to cover her steristrips and sensitive area L medial thigh, then repositioned  the splint.  Therex:  pt performed quad sets, with brace open and L leg resting on table, also SLR with and without the brace Patient instructed in and performed ankle pumps with blue theraband. 15 x , provided with band to use at home.    11/28/22: Evaluation, primarily spent most of time repositioning and educating the pt in correct positioning of the [facility] joy splint, marked correct position with marker on pts leg for her. Inst in quad sets within brace.  Called orthopedist for specific precautions regarding pts progression with PT    PATIENT EDUCATION:  Education details: POC, goals Person educated: Patient Education method: Explanation, Demonstration, and Tactile cues Education comprehension: verbalized understanding, returned demonstration, and verbal cues required  HOME EXERCISE PROGRAM: Inst in correct position of brace . And in quad sets   ASSESSMENT:  CLINICAL IMPRESSION: Patient is a 67 y.o. female who participated in skilled physical therapy session due to L meniscus tear and medial femoral condyle debridement.   In more pain today medial knee, probably due to frequency of sit to stand during the day and preparing for her family to come in over the holiday.  Therefore utilized more pain and edema management techniques.  Advised pt to elevate her L Le and utilize ice more frequently due to her activity requirements at home.  Continuing to  avoid weight bearing bending on L LE until end of this month, reviewed with pt again today. She will return to her surgeon within the next 1 1/2 weeks for assessment.  OBJECTIVE IMPAIRMENTS: decreased activity tolerance, decreased balance, decreased endurance, decreased knowledge of condition, decreased mobility,  difficulty walking, decreased ROM, decreased strength, increased edema, impaired perceived functional ability, impaired flexibility, and pain.   ACTIVITY LIMITATIONS: carrying, lifting, bending, standing, squatting, and  stairs  PARTICIPATION LIMITATIONS: meal prep, cleaning, laundry, driving, shopping, community activity, and yard work  PERSONAL FACTORS: Behavior pattern, Education, Fitness, Past/current experiences, and 3+ comorbidities: major depressive disorder, chronic hepititis, multiple hip and shoulder injuries and arthroscopic repairs  are also affecting patient's functional outcome.   REHAB POTENTIAL: Fair    CLINICAL DECISION MAKING: Evolving/moderate complexity  EVALUATION COMPLEXITY: Moderate   GOALS: Goals reviewed with patient? Yes  SHORT TERM GOALS: Target date: 2 weeks 12/05/22 I HEP Baseline: Goal status: INITIAL   LONG TERM GOALS: Target date: 01/23/23  Tegner Lysholm Knee Score: 19 / 100 = 19.0 % improve to 80/100 Baseline:  Goal status: INITIAL  2.  Improve L knee flexibility to 0 to 120 Baseline: flex NT, ext 0 Goal status: INITIAL  3.  L LE strength 5/5 for normal transfers,functional gait Baseline:  Goal status: INITIAL  4.  30 sec sit to stand 14 reps or greater for normal transfers, gait Baseline:  Goal status: INITIAL    PLAN:  PT FREQUENCY: 2x/week  PT DURATION: 8 weeks  PLANNED INTERVENTIONS: 97110-Therapeutic exercises, 97530- Therapeutic activity, 97112- Neuromuscular re-education, 97535- Self Care, 16109- Manual therapy, and 97014- Electrical stimulation (unattended)  PLAN FOR NEXT SESSION: continue ROM L knee advance more strengthening , stability training   Austan Nicholl L Melanye Hiraldo, PT, DPT, OCS 12/21/2022, 2:52 PM

## 2022-12-26 ENCOUNTER — Ambulatory Visit: Payer: Medicare Other

## 2022-12-26 ENCOUNTER — Other Ambulatory Visit: Payer: Self-pay

## 2022-12-26 DIAGNOSIS — R262 Difficulty in walking, not elsewhere classified: Secondary | ICD-10-CM | POA: Diagnosis not present

## 2022-12-26 DIAGNOSIS — G8929 Other chronic pain: Secondary | ICD-10-CM

## 2022-12-26 DIAGNOSIS — M25662 Stiffness of left knee, not elsewhere classified: Secondary | ICD-10-CM

## 2022-12-26 NOTE — Therapy (Signed)
OUTPATIENT PHYSICAL THERAPY LOWER EXTREMITY TREATMENT   Patient Name: Toni [name] MRN: [id number] DOB:1955-10-27, 67 y.o., female Today's Date: 12/26/2022  END OF SESSION:          Past Medical History:  Diagnosis Date   Brachial neuritis or radiculitis NOS    Chronic hepatitis C (HCC) 10/05/2015   treated and rsolved   Depressive disorder, not elsewhere classified    Essential and other specified forms of tremor    Fracture of 5th metatarsal 08/13/2017   Fracture of multiple pubic rami (HCC) 08/13/2017   Headache(784.0)    Hepatitis C infection 12/05/2017   Treated and now resolved and "cured " as of early 2018  And      Memory loss    better see evaluation   Microscopic hematuria    seed dr Patsi Sears in past   Migraine, unspecified, without mention of intractable migraine without mention of status migrainosus    Multiple rib fractures 08/13/2017   Other specified disorders of rotator cuff syndrome of shoulder and allied disorders    Vulvar pain    rx with valium supp   Past Surgical History:  Procedure Laterality Date   BREAST EXCISIONAL BIOPSY Right    No scar   CESAREAN SECTION     x3   SHOULDER ARTHROSCOPY W/ ROTATOR CUFF REPAIR Right 2004   SHOULDER ARTHROSCOPY W/ ROTATOR CUFF REPAIR Left 2008   Patient Active Problem List   Diagnosis Date Noted   Easy bruising 03/21/2022   Common migraine with intractable migraine 12/10/2019   Chronic pain 08/13/2017   Occipital neuralgia of right side 10/21/2016   Chronic migraine 01/26/2015   Cervicogenic headache 12/16/2014   Subclinical hypothyroidism 12/16/2014   Memory loss 10/08/2014   Depression 10/08/2014   Generalized anxiety disorder 10/08/2014   Tremor 10/08/2014   Major depressive disorder, single episode, moderate (HCC) 08/27/2014   Memory difficulties 08/27/2014   Degenerative disc disease, cervical 08/27/2014   Chronically on opiate therapy 08/27/2014    PCP: Madelin Headings  REFERRING PROVIDER: Duwayne Heck, MD, [facility]  REFERRING DIAG: s/p arthroscopy , partial lateral meniscectomy, chrondroplasty L medial femoral condyle, medial meniscus repair.   THERAPY DIAG:  No diagnosis found.  Rationale for Evaluation and Treatment: Rehabilitation  ONSET DATE: 11/21/22  SUBJECTIVE:   SUBJECTIVE STATEMENT: 12/26/22: not sleeping well, pain 6/10.  L knee. Have help at home now.   EVAL:The patient has many concerns:  having to put more weight on L Leg than she is supposed to, poor fit of L DonJoy brace, unable to reach her L foot to don/doff sock, unable to manage the brace by herself, lots of pain and bruising L ankle lat and med knee, some from the brace  PERTINENT HISTORY: Patient with chronic L knee pain, underwent surgery as described above, referred for outpt PT PAIN:  Are you having pain? Yes: NPRS scale: 7/10 Pain location: L knee, lower leg, ankle Pain description: constant Aggravating factors: the brace rubs, walking, weight bearing Relieving factors: elevating leg  PRECAUTIONS: Other: 50% weight bearing, until 2 weeks post op, Dec 05, 2022, no weight bearing flexion L knee until 6 weeks 01/02/23, ROM out of the brace 2 weeks post op, Dec 05, 2022  RED FLAGS: None   WEIGHT BEARING RESTRICTIONS: Yes 50% for initial 6 weeks, to 01/02/23  FALLS:  Has patient fallen in last 6 months? Yes. Number of falls unclear  LIVING ENVIRONMENT: Lives with: lives alone Lives in: House/apartment Stairs:  No Has following equipment at home: Single point cane and Walker - 2 wheeled  OCCUPATION: retired   PLOF: Independent with basic ADLs  PATIENT GOALS: get to full independence , able to maintain I at  my town home  NEXT MD VISIT: Dec 07 2022  OBJECTIVE:  Note: Objective measures were completed at Evaluation unless otherwise noted.  DIAGNOSTIC FINDINGS: na, see op note in media  PATIENT SURVEYS:  Tegner Lysholm Knee Score: 19 / 100 = 19.0  %   COGNITION: Overall cognitive status: Within functional limits for tasks assessed     SENSATION: WFL  EDEMA:  Mild edema, with multiple healing hematomas entire L LE    POSTURE: rounded shoulders, forward head, and Don joy brace  L LE, brace 2" too low, distally positioned.  Has ace wrap under brace with multiple areas creating indentation skin L leg  PALPATION: Tender along entire L tibia, with patellar mobs, medial and lateral thigh. No warmth or redness noted skin  LOWER EXTREMITY WJX:[id number] to assess L knee ROM due to surgical restrictions 12/26/22: L knee flexion 127, ext 0  LOWER EXTREMITY MMT: able to contract L quads, able to perform SLR within the brace.   LOWER EXTREMITY SPECIAL TESTS:  na  FUNCTIONAL TESTS:  Patient able to get on and off treatment table I and able to move sit to stand with r walker with SBA .  Very slow, guarded with all movements  GAIT: Distance walked: 25' in clinic Assistive device utilized: Walker - 2 wheeled Level of assistance: Modified independence Comments: patient is supposed to be 50% wbing, unclear whether she is really adhering to this restriction.  Walks with step to pattern with walker, leading with L LE, L knee extended in immobilizer   TODAY'S TREATMENT:                                                                                                                              DATE:  12/26/22: Supine for NMES [place] current, x 7 min while performing L quad sets with L knee over 1/2 foam roller Long arc quads L 3#  Seated knee flexion 25#, B 15 x 3  12/21/22:  Gait with st cane, utilizing some circumduction L LE, Nustep level 3, managed for 3 min then stopped due to pain Supine for NMES 4 pads, [place], L distal quads, in combination with quad sets over small 1/2 roller Supine for vasopneumatic x 10 min with L LE elevated on wedge  12/19/22: Nustep level 5 6 min LE only Gait training with st cane in R hand, with good  pattern noted, mild decreased L hip ext in mid and terminal stance Bknee ext , 5 # B up, L LE eccentric lowering 15 reps Seated B knee flexion 20# 15 reps Standing with 2# cuff weights each ankle for alt hip abd, alt hip flex, alt hip ext Standing heel/ toe rocks on airex in ll bars Standing  tandem with leading foot on airex with horizontal head turns for proprioceptive training  12/12/22: Nustep L5 LE's only Supine SLR foot up Supine SLR L LE ER 30 degrees Seated LAQ's 3#  Seated hamstring curls green, stopped after 2 attempts, 5 reps each, due to pain L knee medial jt line Standing for L LE 3 way hip 2# ankle wt L LE, 10 reps each Heel/ toe rocks on airex in ll bars to maintain PWB L Heel raises with ball squeeze ankles to engage medial stabilizers Heel /toe raises with isometric resistance from red t band around ankles 20 reps, hands on bars to maintain PWB  Stair training, practiced up /down 3 steps in clinic x 2, instructed in leading down with L, up with R to avoid weight bearing in flexion on L knee. 12/08/22 LAQ 2# 2x10  HS curls green 2x10  Fitter pushes 2 blue band 2x10 STS x10 SAQ 2x10 SLR 2x10 NuStep L4 x80mins    12/05/22:  Supine for heel slides, L LE, to tolerance 10 reps, measured 73 degrees Added flat sheet around L  foot, for assisted heel slides, pt to replicate at home Supine quad sets L with towel roll under knee for tactile cuing SLR L , no brace, able to manage without lag, over 10 reps Seated L knee flexion, pt using flat sheet to support L foot Standing with brace on for heel/toe rocks Standing with brace within walker for L hip flex and abd All ex x 10 reps.  Applied ice R ant knee after therex to alleviate edema and soreness.  11/30/22:  Therapeutic activities:   Spent a good portion of today's session repositioning the Donjoy  brace and trying to make it more comfortable, removed the ace wrap and added non adherent guaze that the pt brought with her  to cover her steristrips and sensitive area L medial thigh, then repositioned the splint.  Therex:  pt performed quad sets, with brace open and L leg resting on table, also SLR with and without the brace Patient instructed in and performed ankle pumps with blue theraband. 15 x , provided with band to use at home.    11/28/22: Evaluation, primarily spent most of time repositioning and educating the pt in correct positioning of the [facility] joy splint, marked correct position with marker on pts leg for her. Inst in quad sets within brace.  Called orthopedist for specific precautions regarding pts progression with PT    PATIENT EDUCATION:  Education details: POC, goals Person educated: Patient Education method: Explanation, Demonstration, and Tactile cues Education comprehension: verbalized understanding, returned demonstration, and verbal cues required  HOME EXERCISE PROGRAM: Inst in correct position of brace . And in quad sets   ASSESSMENT:  CLINICAL IMPRESSION: Patient is a 67 y.o. female who participated in skilled physical therapy session due to L meniscus tear and medial femoral condyle debridement.   In pain today primarily medial posterior L knee.  Excellent ROM noted although increased pain noted by pt after stretching.  We did not perform any specific leg pressing motions, avoided the Nustep due to her increased pain yesterday.  Slightly abbreviated session as pt 20 min late today. She will return to her surgeon within the next 1 1/2 weeks for assessment. Excellent gait pattern noted today.   OBJECTIVE IMPAIRMENTS: decreased activity tolerance, decreased balance, decreased endurance, decreased knowledge of condition, decreased mobility, difficulty walking, decreased ROM, decreased strength, increased edema, impaired perceived functional ability, impaired flexibility, and pain.  ACTIVITY LIMITATIONS: carrying, lifting, bending, standing, squatting, and stairs  PARTICIPATION  LIMITATIONS: meal prep, cleaning, laundry, driving, shopping, community activity, and yard work  PERSONAL FACTORS: Behavior pattern, Education, Fitness, Past/current experiences, and 3+ comorbidities: major depressive disorder, chronic hepititis, multiple hip and shoulder injuries and arthroscopic repairs  are also affecting patient's functional outcome.   REHAB POTENTIAL: Fair    CLINICAL DECISION MAKING: Evolving/moderate complexity  EVALUATION COMPLEXITY: Moderate   GOALS: Goals reviewed with patient? Yes  SHORT TERM GOALS: Target date: 2 weeks 12/05/22 I HEP Baseline: Goal status: INITIAL   LONG TERM GOALS: Target date: 01/23/23  Tegner Lysholm Knee Score: 19 / 100 = 19.0 % improve to 80/100 Baseline:  Goal status: INITIAL  2.  Improve L knee flexibility to 0 to 120 Baseline: flex NT, ext 0 Goal status: INITIAL  3.  L LE strength 5/5 for normal transfers,functional gait Baseline:  Goal status: INITIAL  4.  30 sec sit to stand 14 reps or greater for normal transfers, gait Baseline:  Goal status: INITIAL    PLAN:  PT FREQUENCY: 2x/week  PT DURATION: 8 weeks  PLANNED INTERVENTIONS: 97110-Therapeutic exercises, 97530- Therapeutic activity, 97112- Neuromuscular re-education, 97535- Self Care, 27253- Manual therapy, and 97014- Electrical stimulation (unattended)  PLAN FOR NEXT SESSION: continue ROM L knee advance more strengthening , stability training   Emberly Tomasso L Zacari Stiff, PT, DPT, OCS 12/26/2022, 2:41 PM

## 2022-12-30 ENCOUNTER — Ambulatory Visit: Payer: Medicare Other

## 2023-01-09 ENCOUNTER — Ambulatory Visit: Payer: Medicare Other | Attending: Orthopedic Surgery

## 2023-01-09 ENCOUNTER — Other Ambulatory Visit: Payer: Self-pay

## 2023-01-09 DIAGNOSIS — G8929 Other chronic pain: Secondary | ICD-10-CM | POA: Diagnosis present

## 2023-01-09 DIAGNOSIS — R262 Difficulty in walking, not elsewhere classified: Secondary | ICD-10-CM | POA: Insufficient documentation

## 2023-01-09 DIAGNOSIS — M25562 Pain in left knee: Secondary | ICD-10-CM | POA: Insufficient documentation

## 2023-01-09 DIAGNOSIS — M25662 Stiffness of left knee, not elsewhere classified: Secondary | ICD-10-CM | POA: Diagnosis present

## 2023-01-09 NOTE — Therapy (Signed)
 OUTPATIENT PHYSICAL THERAPY LOWER EXTREMITY TREATMENT   Patient Name: Toni [name] MRN: [id number] DOB:[birth year], 68 y.o., female Today's Date: 01/09/2023  END OF SESSION:  PT End of Session - 01/09/23 1428     Visit Number 9    Date for PT Re-Evaluation 01/23/23    Progress Note Due on Visit 10    PT Start Time 1430    PT Stop Time 1515    PT Time Calculation (min) 45 min    Activity Tolerance Patient tolerated treatment well    Behavior During Therapy [facility] for tasks assessed/performed;Anxious                    Past Medical History:  Diagnosis Date   Brachial neuritis or radiculitis NOS    Chronic hepatitis C (HCC) 10/05/2015   treated and rsolved   Depressive disorder, not elsewhere classified    Essential and other specified forms of tremor    Fracture of 5th metatarsal 08/13/2017   Fracture of multiple pubic rami (HCC) 08/13/2017   Headache(784.0)    Hepatitis C infection 12/05/2017   Treated and now resolved and cured  as of early 2018  And      Memory loss    better see evaluation   Microscopic hematuria    seed dr chales in past   Migraine, unspecified, without mention of intractable migraine without mention of status migrainosus    Multiple rib fractures 08/13/2017   Other specified disorders of rotator cuff syndrome of shoulder and allied disorders    Vulvar pain    rx with valium  supp   Past Surgical History:  Procedure Laterality Date   BREAST EXCISIONAL BIOPSY Right    No scar   CESAREAN SECTION     x3   SHOULDER ARTHROSCOPY W/ ROTATOR CUFF REPAIR Right 2004   SHOULDER ARTHROSCOPY W/ ROTATOR CUFF REPAIR Left 2008   Patient Active Problem List   Diagnosis Date Noted   Easy bruising 03/21/2022   Common migraine with intractable migraine 12/10/2019   Chronic pain 08/13/2017   Occipital neuralgia of right side 10/21/2016   Chronic migraine 01/26/2015   Cervicogenic headache 12/16/2014   Subclinical hypothyroidism  12/16/2014   Memory loss 10/08/2014   Depression 10/08/2014   Generalized anxiety disorder 10/08/2014   Tremor 10/08/2014   Major depressive disorder, single episode, moderate (HCC) 08/27/2014   Memory difficulties 08/27/2014   Degenerative disc disease, cervical 08/27/2014   Chronically on opiate therapy 08/27/2014    PCP: Charlett Apolinar POUR  REFERRING PROVIDER: Selinda Gosling, MD, [facility]  REFERRING DIAG: s/p arthroscopy , partial lateral meniscectomy, chrondroplasty L medial femoral condyle, medial meniscus repair.   THERAPY DIAG:  Difficulty in walking, not elsewhere classified  Decreased ROM of left knee  Chronic pain of left knee  Rationale for Evaluation and Treatment: Rehabilitation  ONSET DATE: 11/21/22  SUBJECTIVE:   SUBJECTIVE STATEMENT: 01/09/23:  saw the orthopedic, he started me on a prednisone  doe pack, and he advised me to use a brace for compression because my knee was so inflamed.    EVAL:The patient has many concerns:  having to put more weight on L Leg than she is supposed to, poor fit of L DonJoy brace, unable to reach her L foot to don/doff sock, unable to manage the brace by herself, lots of pain and bruising L ankle lat and med knee, some from the brace  PERTINENT HISTORY: Patient with chronic L knee pain, underwent surgery as described above,  referred for outpt PT PAIN:  Are you having pain? Yes: NPRS scale: 7/10 Pain location: L knee, lower leg, ankle Pain description: constant Aggravating factors: the brace rubs, walking, weight bearing Relieving factors: elevating leg  PRECAUTIONS: Other: 50% weight bearing, until 2 weeks post op, Dec 05, 2022, no weight bearing flexion L knee until 6 weeks 01/02/23, ROM out of the brace 2 weeks post op, Dec 05, 2022  RED FLAGS: None   WEIGHT BEARING RESTRICTIONS: Yes 50% for initial 6 weeks, to 01/02/23  FALLS:  Has patient fallen in last 6 months? Yes. Number of falls unclear  LIVING ENVIRONMENT: Lives  with: lives alone Lives in: House/apartment Stairs: No Has following equipment at home: Single point cane and Environmental Consultant - 2 wheeled  OCCUPATION: retired   PLOF: Independent with basic ADLs  PATIENT GOALS: get to full independence , able to maintain I at  my town home  NEXT MD VISIT: Dec 07 2022  OBJECTIVE:  Note: Objective measures were completed at Evaluation unless otherwise noted.  DIAGNOSTIC FINDINGS: na, see op note in media  PATIENT SURVEYS:  Tegner Lysholm Knee Score: 19 / 100 = 19.0 %   COGNITION: Overall cognitive status: Within functional limits for tasks assessed     SENSATION: WFL  EDEMA:  Mild edema, with multiple healing hematomas entire L LE    POSTURE: rounded shoulders, forward head, and Don joy brace  L LE, brace 2 too low, distally positioned.  Has ace wrap under brace with multiple areas creating indentation skin L leg  PALPATION: Tender along entire L tibia, with patellar mobs, medial and lateral thigh. No warmth or redness noted skin  LOWER EXTREMITY MNF:lwjaoz to assess L knee ROM due to surgical restrictions 12/26/22: L knee flexion 127, ext 0  LOWER EXTREMITY MMT: able to contract L quads, able to perform SLR within the brace.   LOWER EXTREMITY SPECIAL TESTS:  na  FUNCTIONAL TESTS:  Patient able to get on and off treatment table I and able to move sit to stand with r walker with SBA .  Very slow, guarded with all movements  GAIT: Distance walked: 34' in clinic Assistive device utilized: Walker - 2 wheeled Level of assistance: Modified independence Comments: patient is supposed to be 50% wbing, unclear whether she is really adhering to this restriction.  Walks with step to pattern with walker, leading with L LE, L knee extended in immobilizer   TODAY'S TREATMENT:                                                                                                                              DATE:   01/08/22: Nustep level 4 LE's only x 5 min  for mid range rocking, tissue perfusion 4# long arc quads  Seated hamstring curls green t band Standing L hip abd with 2# wt , 10 reps, 2 sets  Supine bridging over ball, 20 reps   Vasopneumatic x 10 min  34 degrees, with L Le elevated on wedge  12/26/22: Supine for NMES Russian current, x 7 min while performing L quad sets with L knee over 1/2 foam roller Long arc quads L 3#  Seated knee flexion 25#, B 15 x 3  12/21/22:  Gait with st cane, utilizing some circumduction L LE, Nustep level 3, managed for 3 min then stopped due to pain Supine for NMES 4 pads, Russian, L distal quads, in combination with quad sets over small 1/2 roller Supine for vasopneumatic x 10 min with L LE elevated on wedge  12/19/22: Nustep level 5 6 min LE only Gait training with st cane in R hand, with good pattern noted, mild decreased L hip ext in mid and terminal stance Bknee ext , 5 # B up, L LE eccentric lowering 15 reps Seated B knee flexion 20# 15 reps Standing with 2# cuff weights each ankle for alt hip abd, alt hip flex, alt hip ext Standing heel/ toe rocks on airex in ll bars Standing tandem with leading foot on airex with horizontal head turns for proprioceptive training  12/12/22: Nustep L5 LE's only Supine SLR foot up Supine SLR L LE ER 30 degrees Seated LAQ's 3#  Seated hamstring curls green, stopped after 2 attempts, 5 reps each, due to pain L knee medial jt line Standing for L LE 3 way hip 2# ankle wt L LE, 10 reps each Heel/ toe rocks on airex in ll bars to maintain PWB L Heel raises with ball squeeze ankles to engage medial stabilizers Heel /toe raises with isometric resistance from red t band around ankles 20 reps, hands on bars to maintain PWB  Stair training, practiced up /down 3 steps in clinic x 2, instructed in leading down with L, up with R to avoid weight bearing in flexion on L knee. 12/08/22 LAQ 2# 2x10  HS curls green 2x10  Fitter pushes 2 blue band 2x10 STS x10 SAQ  2x10 SLR 2x10 NuStep L4 x16mins    12/05/22:  Supine for heel slides, L LE, to tolerance 10 reps, measured 73 degrees Added flat sheet around L  foot, for assisted heel slides, pt to replicate at home Supine quad sets L with towel roll under knee for tactile cuing SLR L , no brace, able to manage without lag, over 10 reps Seated L knee flexion, pt using flat sheet to support L foot Standing with brace on for heel/toe rocks Standing with brace within walker for L hip flex and abd All ex x 10 reps.  Applied ice R ant knee after therex to alleviate edema and soreness.  11/30/22:  Therapeutic activities:   Spent a good portion of today's session repositioning the Donjoy  brace and trying to make it more comfortable, removed the ace wrap and added non adherent guaze that the pt brought with her to cover her steristrips and sensitive area L medial thigh, then repositioned the splint.  Therex:  pt performed quad sets, with brace open and L leg resting on table, also SLR with and without the brace Patient instructed in and performed ankle pumps with blue theraband. 15 x , provided with band to use at home.    11/28/22: Evaluation, primarily spent most of time repositioning and educating the pt in correct positioning of the [facility] joy splint, marked correct position with marker on pts leg for her. Inst in quad sets within brace.  Called orthopedist for specific precautions regarding pts progression with PT  PATIENT EDUCATION:  Education details: POC, goals Person educated: Patient Education method: Explanation, Demonstration, and Tactile cues Education comprehension: verbalized understanding, returned demonstration, and verbal cues required  HOME EXERCISE PROGRAM: Inst in correct position of brace . And in quad sets   ASSESSMENT:  CLINICAL IMPRESSION: Patient is a 68 y.o. female who participated in skilled physical therapy session due to L meniscus tear and medial femoral condyle  debridement.  She has experienced more tissue irritability L med and lat knee over pes anserine since she was last here, saw her orthopedics and they recommended anti inflammatory measures as well.  Her skin medial and lat knee is quite sensitive to touch,she cannot tolerate her skirt on her skin. Instructed her in desensitization with washcloth. Still good ROM L knee.  Assessment of L quads strength and hams strength wfl today.  Having L ant hip pain with standing hip abd today.  Gait good, minimal to no deviations. She has been cleared for weight bearing with L knee flexed now so changing her mechanics for sit to stand motion.      OBJECTIVE IMPAIRMENTS: decreased activity tolerance, decreased balance, decreased endurance, decreased knowledge of condition, decreased mobility, difficulty walking, decreased ROM, decreased strength, increased edema, impaired perceived functional ability, impaired flexibility, and pain.   ACTIVITY LIMITATIONS: carrying, lifting, bending, standing, squatting, and stairs  PARTICIPATION LIMITATIONS: meal prep, cleaning, laundry, driving, shopping, community activity, and yard work  PERSONAL FACTORS: Behavior pattern, Education, Fitness, Past/current experiences, and 3+ comorbidities: major depressive disorder, chronic hepititis, multiple hip and shoulder injuries and arthroscopic repairs  are also affecting patient's functional outcome.   REHAB POTENTIAL: Fair    CLINICAL DECISION MAKING: Evolving/moderate complexity  EVALUATION COMPLEXITY: Moderate   GOALS: Goals reviewed with patient? Yes  SHORT TERM GOALS: Target date: 2 weeks 12/05/22 I HEP Baseline: Goal status: INITIAL   LONG TERM GOALS: Target date: 01/23/23  Tegner Lysholm Knee Score: 19 / 100 = 19.0 % improve to 80/100 Baseline:  Goal status: INITIAL  2.  Improve L knee flexibility to 0 to 120 Baseline: flex NT, ext 0 Goal status: INITIAL  3.  L LE strength 5/5 for normal transfers,functional  gait Baseline:  Goal status: INITIAL  4.  30 sec sit to stand 14 reps or greater for normal transfers, gait Baseline:  Goal status: INITIAL    PLAN:  PT FREQUENCY: 2x/week  PT DURATION: 8 weeks  PLANNED INTERVENTIONS: 97110-Therapeutic exercises, 97530- Therapeutic activity, 97112- Neuromuscular re-education, 97535- Self Care, [id number]- Manual therapy, and [id number]- Electrical stimulation (unattended)  PLAN FOR NEXT SESSION: continue ROM L knee advance more strengthening , stability training   Toni [name], PT, DPT, OCS 01/09/2023, 5:31 PM

## 2023-01-16 ENCOUNTER — Ambulatory Visit: Payer: Medicare Other

## 2023-01-16 ENCOUNTER — Other Ambulatory Visit: Payer: Self-pay

## 2023-01-16 DIAGNOSIS — R262 Difficulty in walking, not elsewhere classified: Secondary | ICD-10-CM

## 2023-01-16 DIAGNOSIS — G8929 Other chronic pain: Secondary | ICD-10-CM

## 2023-01-16 DIAGNOSIS — M25662 Stiffness of left knee, not elsewhere classified: Secondary | ICD-10-CM

## 2023-01-16 NOTE — Therapy (Signed)
 OUTPATIENT PHYSICAL THERAPY LOWER EXTREMITY TREATMENT/progress note   Progress Note Reporting Period 11/28/22 to 01/16/23  See note below for Objective Data and Assessment of Progress/Goals.     Patient Name: Toni [name] MRN: [id number] DOB:November 27, 1955, 68 y.o., female Today's Date: 01/16/2023  END OF SESSION:  PT End of Session - 01/16/23 1408     Visit Number 10    Date for PT Re-Evaluation 01/23/23    Progress Note Due on Visit 20    PT Start Time 1300    PT Stop Time 1345    PT Time Calculation (min) 45 min    Activity Tolerance Patient tolerated treatment well    Behavior During Therapy [facility] for tasks assessed/performed;Anxious             Past Medical History:  Diagnosis Date   Brachial neuritis or radiculitis NOS    Chronic hepatitis C (HCC) 10/05/2015   treated and rsolved   Depressive disorder, not elsewhere classified    Essential and other specified forms of tremor    Fracture of 5th metatarsal 08/13/2017   Fracture of multiple pubic rami (HCC) 08/13/2017   Headache(784.0)    Hepatitis C infection 12/05/2017   Treated and now resolved and cured  as of early 2018  And      Memory loss    better see evaluation   Microscopic hematuria    seed dr chales in past   Migraine, unspecified, without mention of intractable migraine without mention of status migrainosus    Multiple rib fractures 08/13/2017   Other specified disorders of rotator cuff syndrome of shoulder and allied disorders    Vulvar pain    rx with valium  supp   Past Surgical History:  Procedure Laterality Date   BREAST EXCISIONAL BIOPSY Right    No scar   CESAREAN SECTION     x3   SHOULDER ARTHROSCOPY W/ ROTATOR CUFF REPAIR Right 2004   SHOULDER ARTHROSCOPY W/ ROTATOR CUFF REPAIR Left 2008   Patient Active Problem List   Diagnosis Date Noted   Easy bruising 03/21/2022   Common migraine with intractable migraine 12/10/2019   Chronic pain 08/13/2017   Occipital  neuralgia of right side 10/21/2016   Chronic migraine 01/26/2015   Cervicogenic headache 12/16/2014   Subclinical hypothyroidism 12/16/2014   Memory loss 10/08/2014   Depression 10/08/2014   Generalized anxiety disorder 10/08/2014   Tremor 10/08/2014   Major depressive disorder, single episode, moderate (HCC) 08/27/2014   Memory difficulties 08/27/2014   Degenerative disc disease, cervical 08/27/2014   Chronically on opiate therapy 08/27/2014    PCP: Charlett Apolinar POUR  REFERRING PROVIDER: Selinda Gosling, MD, [facility]  REFERRING DIAG: s/p arthroscopy , partial lateral meniscectomy, chrondroplasty L medial femoral condyle, medial meniscus repair.   THERAPY DIAG:  Difficulty in walking, not elsewhere classified  Decreased ROM of left knee  Chronic pain of left knee  Rationale for Evaluation and Treatment: Rehabilitation  ONSET DATE: 11/21/22  SUBJECTIVE:   SUBJECTIVE STATEMENT: 01/16/23:  feeling less inflamed but still can't tolerate clothes rubbing on skin L knee.    EVAL:The patient has many concerns:  having to put more weight on L Leg than she is supposed to, poor fit of L DonJoy brace, unable to reach her L foot to don/doff sock, unable to manage the brace by herself, lots of pain and bruising L ankle lat and med knee, some from the brace  PERTINENT HISTORY: Patient with chronic L knee pain, underwent surgery as  described above, referred for outpt PT PAIN:  Are you having pain? Yes: NPRS scale: 7/10 Pain location: L knee, lower leg, ankle Pain description: constant Aggravating factors: the brace rubs, walking, weight bearing Relieving factors: elevating leg  PRECAUTIONS: Other: 50% weight bearing, until 2 weeks post op, Dec 05, 2022, no weight bearing flexion L knee until 6 weeks 01/02/23, ROM out of the brace 2 weeks post op, Dec 05, 2022  RED FLAGS: None   WEIGHT BEARING RESTRICTIONS: Yes 50% for initial 6 weeks, to 01/02/23  FALLS:  Has patient fallen in last  6 months? Yes. Number of falls unclear  LIVING ENVIRONMENT: Lives with: lives alone Lives in: House/apartment Stairs: No Has following equipment at home: Single point cane and Environmental Consultant - 2 wheeled  OCCUPATION: retired   PLOF: Independent with basic ADLs  PATIENT GOALS: get to full independence , able to maintain I at  my town home  NEXT MD VISIT: Dec 07 2022  OBJECTIVE:  Note: Objective measures were completed at Evaluation unless otherwise noted.  DIAGNOSTIC FINDINGS: na, see op note in media  PATIENT SURVEYS:  Tegner Lysholm Knee Score: 19 / 100 = 19.0 %   COGNITION: Overall cognitive status: Within functional limits for tasks assessed     SENSATION: WFL  EDEMA:  Mild edema, with multiple healing hematomas entire L LE  POSTURE: rounded shoulders, forward head, and Don joy brace  L LE, brace 2 too low, distally positioned.  Has ace wrap under brace with multiple areas creating indentation skin L leg  PALPATION: Tender along entire L tibia, with patellar mobs, medial and lateral thigh. No warmth or redness noted skin  LOWER EXTREMITY MNF:lwjaoz to assess L knee ROM due to surgical restrictions 12/26/22: L knee flexion 127, ext 0 01/16/23: L knee flexion 135, ext 0  LOWER EXTREMITY MMT: able to contract L quads, able to perform SLR within the brace. MMT LE L quads, hamstrings wnl, slight atrophy noted L gastrocs   LOWER EXTREMITY SPECIAL TESTS:  na  FUNCTIONAL TESTS:  Patient able to get on and off treatment table I and able to move sit to stand with r walker with SBA .  Very slow, guarded with all movements  GAIT: Distance walked: 46' in clinic Assistive device utilized: Walker - 2 wheeled Level of assistance: Modified independence Comments: patient is supposed to be 50% wbing, unclear whether she is really adhering to this restriction.  Walks with step to pattern with walker, leading with L LE, L knee extended in immobilizer   TODAY'S TREATMENT:                                                                                                                               DATE:  01/16/23: Reassessed pt for 10th visit goals, including 30 sec sit to stand, ROM, MMT  The patient was instructed in progression to exercises to emphasize stability B LE's as follows: Gait: advised to  shorten B stride length as patient is walking with a large step length and is impacting the ground on heel strike L with L knee hyperextended, by reducing stride length she is able to absorb shock with heel strike better.  Resisted gait, 10# 5 reps F, B and side, with CGA throughout.   Heel/toe rocks on airex to engage righting reactions and B ankle musculature recruitment  01/08/22: Nustep level 4 LE's only x 5 min for mid range rocking, tissue perfusion 4# long arc quads  Seated hamstring curls green t band Standing L hip abd with 2# wt , 10 reps, 2 sets  Supine bridging over ball, 20 reps   Vasopneumatic x 10 min 34 degrees, with L Le elevated on wedge  12/26/22: Supine for NMES Russian current, x 7 min while performing L quad sets with L knee over 1/2 foam roller Long arc quads L 3#  Seated knee flexion 25#, B 15 x 3  12/21/22:  Gait with st cane, utilizing some circumduction L LE, Nustep level 3, managed for 3 min then stopped due to pain Supine for NMES 4 pads, Russian, L distal quads, in combination with quad sets over small 1/2 roller Supine for vasopneumatic x 10 min with L LE elevated on wedge  12/19/22: Nustep level 5 6 min LE only Gait training with st cane in R hand, with good pattern noted, mild decreased L hip ext in mid and terminal stance Bknee ext , 5 # B up, L LE eccentric lowering 15 reps Seated B knee flexion 20# 15 reps Standing with 2# cuff weights each ankle for alt hip abd, alt hip flex, alt hip ext Standing heel/ toe rocks on airex in ll bars Standing tandem with leading foot on airex with horizontal head turns for proprioceptive  training  12/12/22: Nustep L5 LE's only Supine SLR foot up Supine SLR L LE ER 30 degrees Seated LAQ's 3#  Seated hamstring curls green, stopped after 2 attempts, 5 reps each, due to pain L knee medial jt line Standing for L LE 3 way hip 2# ankle wt L LE, 10 reps each Heel/ toe rocks on airex in ll bars to maintain PWB L Heel raises with ball squeeze ankles to engage medial stabilizers Heel /toe raises with isometric resistance from red t band around ankles 20 reps, hands on bars to maintain PWB  Stair training, practiced up /down 3 steps in clinic x 2, instructed in leading down with L, up with R to avoid weight bearing in flexion on L knee. 12/08/22 LAQ 2# 2x10  HS curls green 2x10  Fitter pushes 2 blue band 2x10 STS x10 SAQ 2x10 SLR 2x10 NuStep L4 x31mins    12/05/22:  Supine for heel slides, L LE, to tolerance 10 reps, measured 73 degrees Added flat sheet around L  foot, for assisted heel slides, pt to replicate at home Supine quad sets L with towel roll under knee for tactile cuing SLR L , no brace, able to manage without lag, over 10 reps Seated L knee flexion, pt using flat sheet to support L foot Standing with brace on for heel/toe rocks Standing with brace within walker for L hip flex and abd All ex x 10 reps.  Applied ice R ant knee after therex to alleviate edema and soreness.  11/30/22:  Therapeutic activities:   Spent a good portion of today's session repositioning the Donjoy  brace and trying to make it more comfortable, removed the ace wrap and  added non adherent guaze that the pt brought with her to cover her steristrips and sensitive area L medial thigh, then repositioned the splint.  Therex:  pt performed quad sets, with brace open and L leg resting on table, also SLR with and without the brace Patient instructed in and performed ankle pumps with blue theraband. 15 x , provided with band to use at home.    11/28/22: Evaluation, primarily spent most of time  repositioning and educating the pt in correct positioning of the [facility] joy splint, marked correct position with marker on pts leg for her. Inst in quad sets within brace.  Called orthopedist for specific precautions regarding pts progression with PT    PATIENT EDUCATION:  Education details: POC, goals Person educated: Patient Education method: Explanation, Demonstration, and Tactile cues Education comprehension: verbalized understanding, returned demonstration, and verbal cues required  HOME EXERCISE PROGRAM: Inst in correct position of brace . And in quad sets   ASSESSMENT:  CLINICAL IMPRESSION: Patient is a 68 y.o. female who participated in skilled physical therapy session due to L meniscus tear and medial femoral condyle debridement.  She continues to reports tissue irritability L knee, particularly medial, with sensitivity to clothing.  Noted petechiae L medial knee and medial upper tibia.  Her flexibility L knee is excellent as well as knee strength.  She has occasional loss of balance with gait and balance challenges.  Also her gait mechanics with hyperextension of L knee upon heel strike contribute to  impact through L knee jt, worked with her on smaller amplitude movements and reducing this impact today.    Assessment of L quads strength and hams strength wfl today.  She is scheduled for one more visit this week and wishes to continue afterwards , will reassess and adapt her goals at that time.    OBJECTIVE IMPAIRMENTS: decreased activity tolerance, decreased balance, decreased endurance, decreased knowledge of condition, decreased mobility, difficulty walking, decreased ROM, decreased strength, increased edema, impaired perceived functional ability, impaired flexibility, and pain.   ACTIVITY LIMITATIONS: carrying, lifting, bending, standing, squatting, and stairs  PARTICIPATION LIMITATIONS: meal prep, cleaning, laundry, driving, shopping, community activity, and yard work  PERSONAL  FACTORS: Behavior pattern, Education, Fitness, Past/current experiences, and 3+ comorbidities: major depressive disorder, chronic hepititis, multiple hip and shoulder injuries and arthroscopic repairs  are also affecting patient's functional outcome.   REHAB POTENTIAL: Fair    CLINICAL DECISION MAKING: Evolving/moderate complexity  EVALUATION COMPLEXITY: Moderate   GOALS: Goals reviewed with patient? Yes  SHORT TERM GOALS: Target date: 2 weeks 12/05/22 I HEP Baseline: Goal status: INITIAL   LONG TERM GOALS: Target date: 01/23/23  Tegner Lysholm Knee Score: 19 / 100 = 19.0 % improve to 80/100 01/16/23: Tegner Lysholm Knee Score: 59 / 100 = 59.0 %   Goal status: progressing  2.  Improve L knee flexibility to 0 to 120 Baseline: flex NT, ext 0 Goal status: 01/16/23: 0 to 136, met   3.  L LE strength 5/5 for normal transfers,functional gait Baseline:  Goal status: 01/16/23: L knee strength quads, hamstrings 5/5, ankle and hip 4+/5   4.  30 sec sit to stand 14 reps or greater for normal transfers, gait Baseline:  Goal status: 01/16/23; progressing, 10 reps without hand support     PLAN:  PT FREQUENCY: 2x/week  PT DURATION: 8 weeks  PLANNED INTERVENTIONS: 97110-Therapeutic exercises, 97530- Therapeutic activity, 97112- Neuromuscular re-education, 97535- Self Care, [id number]- Manual therapy, and 97014- Electrical stimulation (unattended)  PLAN FOR NEXT  SESSION: continue ROM L knee advance more strengthening , stability training   Kathleen Likins L Madalynn Pickelsimer, PT, DPT, OCS 01/16/2023, 5:15 PM

## 2023-01-18 ENCOUNTER — Ambulatory Visit: Payer: Medicare Other

## 2023-01-23 ENCOUNTER — Ambulatory Visit: Payer: Medicare Other

## 2023-01-25 ENCOUNTER — Ambulatory Visit: Payer: Medicare Other

## 2023-01-25 ENCOUNTER — Other Ambulatory Visit: Payer: Self-pay

## 2023-01-25 DIAGNOSIS — R262 Difficulty in walking, not elsewhere classified: Secondary | ICD-10-CM | POA: Diagnosis not present

## 2023-01-25 DIAGNOSIS — G8929 Other chronic pain: Secondary | ICD-10-CM

## 2023-01-25 DIAGNOSIS — M25662 Stiffness of left knee, not elsewhere classified: Secondary | ICD-10-CM

## 2023-01-25 NOTE — Therapy (Signed)
OUTPATIENT PHYSICAL THERAPY LOWER EXTREMITY TREATMENT/progress note   Progress Note Reporting Period 11/28/22 to 01/25/23  See note below for Objective Data and Assessment of Progress/Goals.     Patient Name: Toni [name] MRN: [id number] DOB:[birth year], 68 y.o., female Today's Date: 01/25/2023  END OF SESSION:  PT End of Session - 01/25/23 1459     Visit Number 11    Date for PT Re-Evaluation 03/08/23    Progress Note Due on Visit 20    PT Start Time 1430    PT Stop Time 1515    PT Time Calculation (min) 45 min    Activity Tolerance Patient tolerated treatment well    Behavior During Therapy [facility] for tasks assessed/performed;Anxious              Past Medical History:  Diagnosis Date   Brachial neuritis or radiculitis NOS    Chronic hepatitis C (HCC) 10/05/2015   treated and rsolved   Depressive disorder, not elsewhere classified    Essential and other specified forms of tremor    Fracture of 5th metatarsal 08/13/2017   Fracture of multiple pubic rami (HCC) 08/13/2017   Headache(784.0)    Hepatitis C infection 12/05/2017   Treated and now resolved and "cured " as of early 2018  And      Memory loss    better see evaluation   Microscopic hematuria    seed dr Patsi Sears in past   Migraine, unspecified, without mention of intractable migraine without mention of status migrainosus    Multiple rib fractures 08/13/2017   Other specified disorders of rotator cuff syndrome of shoulder and allied disorders    Vulvar pain    rx with valium supp   Past Surgical History:  Procedure Laterality Date   BREAST EXCISIONAL BIOPSY Right    No scar   CESAREAN SECTION     x3   SHOULDER ARTHROSCOPY W/ ROTATOR CUFF REPAIR Right 2004   SHOULDER ARTHROSCOPY W/ ROTATOR CUFF REPAIR Left 2008   Patient Active Problem List   Diagnosis Date Noted   Easy bruising 03/21/2022   Common migraine with intractable migraine 12/10/2019   Chronic pain 08/13/2017   Occipital  neuralgia of right side 10/21/2016   Chronic migraine 01/26/2015   Cervicogenic headache 12/16/2014   Subclinical hypothyroidism 12/16/2014   Memory loss 10/08/2014   Depression 10/08/2014   Generalized anxiety disorder 10/08/2014   Tremor 10/08/2014   Major depressive disorder, single episode, moderate (HCC) 08/27/2014   Memory difficulties 08/27/2014   Degenerative disc disease, cervical 08/27/2014   Chronically on opiate therapy 08/27/2014    PCP: Madelin Headings  REFERRING PROVIDER: Duwayne Heck, MD, [facility]  REFERRING DIAG: s/p arthroscopy , partial lateral meniscectomy, chrondroplasty L medial femoral condyle, medial meniscus repair.   THERAPY DIAG:  Difficulty in walking, not elsewhere classified  Decreased ROM of left knee  Chronic pain of left knee  Rationale for Evaluation and Treatment: Rehabilitation  ONSET DATE: 11/21/22  SUBJECTIVE:   SUBJECTIVE STATEMENT: 01/25/23:  feeling less inflamed staying active, knee and L ant hip hurt EVAL:The patient has many concerns:  having to put more weight on L Leg than she is supposed to, poor fit of L DonJoy brace, unable to reach her L foot to don/doff sock, unable to manage the brace by herself, lots of pain and bruising L ankle lat and med knee, some from the brace  PERTINENT HISTORY: Patient with chronic L knee pain, underwent surgery as described above, referred for  outpt PT PAIN:  Are you having pain? Yes: NPRS scale: 7/10 Pain location: L knee, lower leg, ankle Pain description: constant Aggravating factors: the brace rubs, walking, weight bearing Relieving factors: elevating leg  PRECAUTIONS: Other: 50% weight bearing, until 2 weeks post op, Dec 05, 2022, no weight bearing flexion L knee until 6 weeks 01/02/23, ROM out of the brace 2 weeks post op, Dec 05, 2022  RED FLAGS: None   WEIGHT BEARING RESTRICTIONS: Yes 50% for initial 6 weeks, to 01/02/23  FALLS:  Has patient fallen in last 6 months? Yes. Number  of falls unclear  LIVING ENVIRONMENT: Lives with: lives alone Lives in: House/apartment Stairs: No Has following equipment at home: Single point cane and Environmental consultant - 2 wheeled  OCCUPATION: retired   PLOF: Independent with basic ADLs  PATIENT GOALS: get to full independence , able to maintain I at  my town home  NEXT MD VISIT: Dec 07 2022  OBJECTIVE:  Note: Objective measures were completed at Evaluation unless otherwise noted.  DIAGNOSTIC FINDINGS: na, see op note in media  PATIENT SURVEYS:  Tegner Lysholm Knee Score: 19 / 100 = 19.0 %   COGNITION: Overall cognitive status: Within functional limits for tasks assessed     SENSATION: WFL  EDEMA:  Mild edema, with multiple healing hematomas entire L LE  POSTURE: rounded shoulders, forward head, and Don joy brace  L LE, brace 2" too low, distally positioned.  Has ace wrap under brace with multiple areas creating indentation skin L leg  PALPATION: Tender along entire L tibia, with patellar mobs, medial and lateral thigh. No warmth or redness noted skin  LOWER EXTREMITY UJW:[id number] to assess L knee ROM due to surgical restrictions 12/26/22: L knee flexion 127, ext 0 01/16/23: L knee flexion 135, ext 0  LOWER EXTREMITY MMT: able to contract L quads, able to perform SLR within the brace. MMT LE L quads, hamstrings wnl, slight atrophy noted L gastrocs   LOWER EXTREMITY SPECIAL TESTS:  na  FUNCTIONAL TESTS:  Patient able to get on and off treatment table I and able to move sit to stand with r walker with SBA .  Very slow, guarded with all movements  GAIT: Distance walked: 34' in clinic Assistive device utilized: Walker - 2 wheeled Level of assistance: Modified independence Comments: patient is supposed to be 50% wbing, unclear whether she is really adhering to this restriction.  Walks with step to pattern with walker, leading with L LE, L knee extended in immobilizer   TODAY'S TREATMENT:                                                                                                                               DATE: 01/25/23: Initially in ll bars for balance/stability activities including rocker board side to side,  progressed to 15 reps without UE support, tends to lock knees into extension Heel/toe rocks on airex 15 x Resisted gait 5 reps each  direction with 10# Nustep level 5 Ue's and LE's, 6 min cues to avoid valgum L knee  Sit to stand from elevated table with red tband around thighs for tactile cue to engage B hip abductors and avoid valgum movement B knees.  Mini lunges L foot on compliant side of bosu, with B UE support, again trying to avoid valgum L knee  01/16/23: Reassessed pt for 10th visit goals, including 30 sec sit to stand, ROM, MMT  The patient was instructed in progression to exercises to emphasize stability B LE's as follows: Gait: advised to shorten B stride length as patient is walking with a large step length and is impacting the ground on heel strike L with L knee hyperextended, by reducing stride length she is able to absorb shock with heel strike better.  Resisted gait, 10# 5 reps F, B and side, with CGA throughout.   Heel/toe rocks on airex to engage righting reactions and B ankle musculature recruitment  01/08/22: Nustep level 4 LE's only x 5 min for mid range rocking, tissue perfusion 4# long arc quads  Seated hamstring curls green t band Standing L hip abd with 2# wt , 10 reps, 2 sets  Supine bridging over ball, 20 reps   Vasopneumatic x 10 min 34 degrees, with L Le elevated on wedge  12/26/22: Supine for NMES [place] current, x 7 min while performing L quad sets with L knee over 1/2 foam roller Long arc quads L 3#  Seated knee flexion 25#, B 15 x 3  12/21/22:  Gait with st cane, utilizing some circumduction L LE, Nustep level 3, managed for 3 min then stopped due to pain Supine for NMES 4 pads, [place], L distal quads, in combination with quad sets over small 1/2  roller Supine for vasopneumatic x 10 min with L LE elevated on wedge  12/19/22: Nustep level 5 6 min LE only Gait training with st cane in R hand, with good pattern noted, mild decreased L hip ext in mid and terminal stance Bknee ext , 5 # B up, L LE eccentric lowering 15 reps Seated B knee flexion 20# 15 reps Standing with 2# cuff weights each ankle for alt hip abd, alt hip flex, alt hip ext Standing heel/ toe rocks on airex in ll bars Standing tandem with leading foot on airex with horizontal head turns for proprioceptive training  12/12/22: Nustep L5 LE's only Supine SLR foot up Supine SLR L LE ER 30 degrees Seated LAQ's 3#  Seated hamstring curls green, stopped after 2 attempts, 5 reps each, due to pain L knee medial jt line Standing for L LE 3 way hip 2# ankle wt L LE, 10 reps each Heel/ toe rocks on airex in ll bars to maintain PWB L Heel raises with ball squeeze ankles to engage medial stabilizers Heel /toe raises with isometric resistance from red t band around ankles 20 reps, hands on bars to maintain PWB  Stair training, practiced up /down 3 steps in clinic x 2, instructed in leading down with L, up with R to avoid weight bearing in flexion on L knee. 12/08/22 LAQ 2# 2x10  HS curls green 2x10  Fitter pushes 2 blue band 2x10 STS x10 SAQ 2x10 SLR 2x10 NuStep L4 x79mins    12/05/22:  Supine for heel slides, L LE, to tolerance 10 reps, measured 73 degrees Added flat sheet around L  foot, for assisted heel slides, pt to replicate at home Supine quad sets L with  towel roll under knee for tactile cuing SLR L , no brace, able to manage without lag, over 10 reps Seated L knee flexion, pt using flat sheet to support L foot Standing with brace on for heel/toe rocks Standing with brace within walker for L hip flex and abd All ex x 10 reps.  Applied ice R ant knee after therex to alleviate edema and soreness.  11/30/22:  Therapeutic activities:   Spent a good portion of  today's session repositioning the Donjoy  brace and trying to make it more comfortable, removed the ace wrap and added non adherent guaze that the pt brought with her to cover her steristrips and sensitive area L medial thigh, then repositioned the splint.  Therex:  pt performed quad sets, with brace open and L leg resting on table, also SLR with and without the brace Patient instructed in and performed ankle pumps with blue theraband. 15 x , provided with band to use at home.    11/28/22: Evaluation, primarily spent most of time repositioning and educating the pt in correct positioning of the [facility] joy splint, marked correct position with marker on pts leg for her. Inst in quad sets within brace.  Called orthopedist for specific precautions regarding pts progression with PT    PATIENT EDUCATION:  Education details: POC, goals Person educated: Patient Education method: Explanation, Demonstration, and Tactile cues Education comprehension: verbalized understanding, returned demonstration, and verbal cues required  HOME EXERCISE PROGRAM: Inst in correct position of brace . And in quad sets   ASSESSMENT:  CLINICAL IMPRESSION: Patient is a 68 y.o. female who participated in skilled physical therapy session due to L meniscus tear and medial femoral condyle debridement.  She continues to reports tissue irritability L knee both medial and lateral.  Her gait mechanics are improved today, does still tend ot lock B knees into extension for all standing balance activities.  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 She still experiences occasional loss of balance with gait and balance challenges.   OBJECTIVE IMPAIRMENTS: decreased activity tolerance, decreased balance, decreased endurance, decreased knowledge of condition, decreased mobility, difficulty walking, decreased ROM, decreased strength, increased edema, impaired perceived functional ability, impaired flexibility, and  pain.   ACTIVITY LIMITATIONS: carrying, lifting, bending, standing, squatting, and stairs  PARTICIPATION LIMITATIONS: meal prep, cleaning, laundry, driving, shopping, community activity, and yard work  PERSONAL FACTORS: Behavior pattern, Education, Fitness, Past/current experiences, and 3+ comorbidities: major depressive disorder, chronic hepititis, multiple hip and shoulder injuries and arthroscopic repairs  are also affecting patient's functional outcome.   REHAB POTENTIAL: Fair    CLINICAL DECISION MAKING: Evolving/moderate complexity  EVALUATION COMPLEXITY: Moderate   GOALS: Goals reviewed with patient? Yes  SHORT TERM GOALS: Target date: 2 weeks 12/05/22 I HEP Baseline: Goal status: met   LONG TERM GOALS: Target date: 03/08/23, extended today for additional 6 weeks, 1-2 x week  Tegner Lysholm Knee Score: 19 / 100 = 19.0 % improve to 80/100 01/16/23: Tegner Lysholm Knee Score: 59 / 100 = 59.0 %   Goal status: progressing  2.  Improve L knee flexibility to 0 to 120 Baseline: flex NT, ext 0 Goal status: 01/16/23: 0 to 136, met   3.  L LE strength 5/5 for normal transfers,functional gait Baseline:  Goal status: 01/16/23: L knee strength quads, hamstrings 5/5, ankle and hip 4+/5   4.  30 sec sit to stand 14 reps or greater for normal transfers, gait Baseline:  Goal status: 01/16/23; progressing, 10  reps without hand support     PLAN:  PT FREQUENCY: 1-2x/week  PT DURATION: 6 weeks  PLANNED INTERVENTIONS: 97110-Therapeutic exercises, 97530- Therapeutic activity, 97112- Neuromuscular re-education, 97535- Self Care, 16109- Manual therapy, and 97014- Electrical stimulation (unattended)  PLAN FOR NEXT SESSION: continue to  advance more strengthening , stability training , address mechanics with functional mobility  Tedra Coppernoll L Abenezer Odonell, PT, DPT, OCS 01/25/2023, 5:07 PM

## 2023-01-30 ENCOUNTER — Ambulatory Visit: Payer: Medicare Other

## 2023-02-01 ENCOUNTER — Ambulatory Visit: Payer: Medicare Other

## 2023-02-01 ENCOUNTER — Other Ambulatory Visit: Payer: Self-pay

## 2023-02-01 DIAGNOSIS — R262 Difficulty in walking, not elsewhere classified: Secondary | ICD-10-CM

## 2023-02-01 DIAGNOSIS — G8929 Other chronic pain: Secondary | ICD-10-CM

## 2023-02-01 DIAGNOSIS — M25662 Stiffness of left knee, not elsewhere classified: Secondary | ICD-10-CM

## 2023-02-01 NOTE — Therapy (Signed)
OUTPATIENT PHYSICAL THERAPY LOWER EXTREMITY TREATMENT/progress note   Progress Note Reporting Period 11/28/22 to 01/25/23  See note below for Objective Data and Assessment of Progress/Goals.     Patient Name: Toni [name] MRN: [id number] DOB:1955/03/20, 68 y.o., female Today's Date: 02/01/2023  END OF SESSION:  PT End of Session - 02/01/23 1429     Visit Number 12    Date for PT Re-Evaluation 03/08/23    Progress Note Due on Visit 20    PT Start Time 1430    PT Stop Time 1515    PT Time Calculation (min) 45 min    Activity Tolerance Patient tolerated treatment well    Behavior During Therapy [facility] for tasks assessed/performed;Anxious               Past Medical History:  Diagnosis Date   Brachial neuritis or radiculitis NOS    Chronic hepatitis C (HCC) 10/05/2015   treated and rsolved   Depressive disorder, not elsewhere classified    Essential and other specified forms of tremor    Fracture of 5th metatarsal 08/13/2017   Fracture of multiple pubic rami (HCC) 08/13/2017   Headache(784.0)    Hepatitis C infection 12/05/2017   Treated and now resolved and "cured " as of early 2018  And      Memory loss    better see evaluation   Microscopic hematuria    seed dr Patsi Sears in past   Migraine, unspecified, without mention of intractable migraine without mention of status migrainosus    Multiple rib fractures 08/13/2017   Other specified disorders of rotator cuff syndrome of shoulder and allied disorders    Vulvar pain    rx with valium supp   Past Surgical History:  Procedure Laterality Date   BREAST EXCISIONAL BIOPSY Right    No scar   CESAREAN SECTION     x3   SHOULDER ARTHROSCOPY W/ ROTATOR CUFF REPAIR Right 2004   SHOULDER ARTHROSCOPY W/ ROTATOR CUFF REPAIR Left 2008   Patient Active Problem List   Diagnosis Date Noted   Easy bruising 03/21/2022   Common migraine with intractable migraine 12/10/2019   Chronic pain 08/13/2017   Occipital  neuralgia of right side 10/21/2016   Chronic migraine 01/26/2015   Cervicogenic headache 12/16/2014   Subclinical hypothyroidism 12/16/2014   Memory loss 10/08/2014   Depression 10/08/2014   Generalized anxiety disorder 10/08/2014   Tremor 10/08/2014   Major depressive disorder, single episode, moderate (HCC) 08/27/2014   Memory difficulties 08/27/2014   Degenerative disc disease, cervical 08/27/2014   Chronically on opiate therapy 08/27/2014    PCP: Madelin Headings  REFERRING PROVIDER: Duwayne Heck, MD, [facility]  REFERRING DIAG: s/p arthroscopy , partial lateral meniscectomy, chrondroplasty L medial femoral condyle, medial meniscus repair.   THERAPY DIAG:  Difficulty in walking, not elsewhere classified  Decreased ROM of left knee  Chronic pain of left knee  Rationale for Evaluation and Treatment: Rehabilitation  ONSET DATE: 11/21/22  SUBJECTIVE:   SUBJECTIVE STATEMENT: 02/01/23:  went to professional basketball game with grandson this last week, after PT session. My knee really hurts especially if I twist it, I feel like it is swollen a lot.  My L ant hip still hurts a lot too which affects my gait.     EVAL:The patient has many concerns:  having to put more weight on L Leg than she is supposed to, poor fit of L DonJoy brace, unable to reach her L foot to don/doff sock,  unable to manage the brace by herself, lots of pain and bruising L ankle lat and med knee, some from the brace  PERTINENT HISTORY: Patient with chronic L knee pain, underwent surgery as described above, referred for outpt PT PAIN:  Are you having pain? Yes: NPRS scale: 7/10 Pain location: L knee, lower leg, ankle Pain description: constant Aggravating factors: the brace rubs, walking, weight bearing Relieving factors: elevating leg  PRECAUTIONS: Other: 50% weight bearing, until 2 weeks post op, Dec 05, 2022, no weight bearing flexion L knee until 6 weeks 01/02/23, ROM out of the brace 2 weeks post op,  Dec 05, 2022  RED FLAGS: None   WEIGHT BEARING RESTRICTIONS: Yes 50% for initial 6 weeks, to 01/02/23  FALLS:  Has patient fallen in last 6 months? Yes. Number of falls unclear  LIVING ENVIRONMENT: Lives with: lives alone Lives in: House/apartment Stairs: No Has following equipment at home: Single point cane and Environmental consultant - 2 wheeled  OCCUPATION: retired   PLOF: Independent with basic ADLs  PATIENT GOALS: get to full independence , able to maintain I at  my town home  NEXT MD VISIT: Dec 07 2022  OBJECTIVE:  Note: Objective measures were completed at Evaluation unless otherwise noted.  DIAGNOSTIC FINDINGS: na, see op note in media  PATIENT SURVEYS:  Tegner Lysholm Knee Score: 19 / 100 = 19.0 %   COGNITION: Overall cognitive status: Within functional limits for tasks assessed     SENSATION: WFL  EDEMA:  Mild edema, with multiple healing hematomas entire L LE  POSTURE: rounded shoulders, forward head, and Don joy brace  L LE, brace 2" too low, distally positioned.  Has ace wrap under brace with multiple areas creating indentation skin L leg  PALPATION: Tender along entire L tibia, with patellar mobs, medial and lateral thigh. No warmth or redness noted skin  LOWER EXTREMITY GNF:AOZHYQ to assess L knee ROM due to surgical restrictions 12/26/22: L knee flexion 127, ext 0 01/16/23: L knee flexion 135, ext 0  LOWER EXTREMITY MMT: able to contract L quads, able to perform SLR within the brace. MMT LE L quads, hamstrings wnl, slight atrophy noted L gastrocs   LOWER EXTREMITY SPECIAL TESTS:  na  FUNCTIONAL TESTS:  Patient able to get on and off treatment table I and able to move sit to stand with r walker with SBA .  Very slow, guarded with all movements  GAIT: Distance walked: 74' in clinic Assistive device utilized: Walker - 2 wheeled Level of assistance: Modified independence Comments: patient is supposed to be 50% wbing, unclear whether she is really adhering to  this restriction.  Walks with step to pattern with walker, leading with L LE, L knee extended in immobilizer   TODAY'S TREATMENT:                                                                                                                              DATE: 02/01/23:  In ll bars for heel/toe rocks,with 2 # cuff wts ankles B knee flexion 25# B knee ext 10# Sit to stand with heels on bar from hi/low table, 15x Resisted gait 20# 5 reps f and 5 reps back  Ice L knee with L LE elevated for 10 min at end of session.  01/25/23: Initially in ll bars for balance/stability activities including rocker board side to side,  progressed to 15 reps without UE support, tends to lock knees into extension Heel/toe rocks on airex 15 x Resisted gait 5 reps each direction with 10# Nustep level 5 Ue's and LE's, 6 min cues to avoid valgum L knee  Sit to stand from elevated table with red tband around thighs for tactile cue to engage B hip abductors and avoid valgum movement B knees.  Mini lunges L foot on compliant side of bosu, with B UE support, again trying to avoid valgum L knee  01/16/23: Reassessed pt for 10th visit goals, including 30 sec sit to stand, ROM, MMT  The patient was instructed in progression to exercises to emphasize stability B LE's as follows: Gait: advised to shorten B stride length as patient is walking with a large step length and is impacting the ground on heel strike L with L knee hyperextended, by reducing stride length she is able to absorb shock with heel strike better.  Resisted gait, 10# 5 reps F, B and side, with CGA throughout.   Heel/toe rocks on airex to engage righting reactions and B ankle musculature recruitment  01/08/22: Nustep level 4 LE's only x 5 min for mid range rocking, tissue perfusion 4# long arc quads  Seated hamstring curls green t band Standing L hip abd with 2# wt , 10 reps, 2 sets  Supine bridging over ball, 20 reps   Vasopneumatic x 10 min 34 degrees,  with L Le elevated on wedge  12/26/22: Supine for NMES [place] current, x 7 min while performing L quad sets with L knee over 1/2 foam roller Long arc quads L 3#  Seated knee flexion 25#, B 15 x 3  12/21/22:  Gait with st cane, utilizing some circumduction L LE, Nustep level 3, managed for 3 min then stopped due to pain Supine for NMES 4 pads, [place], L distal quads, in combination with quad sets over small 1/2 roller Supine for vasopneumatic x 10 min with L LE elevated on wedge  12/19/22: Nustep level 5 6 min LE only Gait training with st cane in R hand, with good pattern noted, mild decreased L hip ext in mid and terminal stance Bknee ext , 5 # B up, L LE eccentric lowering 15 reps Seated B knee flexion 20# 15 reps Standing with 2# cuff weights each ankle for alt hip abd, alt hip flex, alt hip ext Standing heel/ toe rocks on airex in ll bars Standing tandem with leading foot on airex with horizontal head turns for proprioceptive training  12/12/22: Nustep L5 LE's only Supine SLR foot up Supine SLR L LE ER 30 degrees Seated LAQ's 3#  Seated hamstring curls green, stopped after 2 attempts, 5 reps each, due to pain L knee medial jt line Standing for L LE 3 way hip 2# ankle wt L LE, 10 reps each Heel/ toe rocks on airex in ll bars to maintain PWB L Heel raises with ball squeeze ankles to engage medial stabilizers Heel /toe raises with isometric resistance from red t band around ankles 20 reps, hands on bars to maintain  PWB  Stair training, practiced up /down 3 steps in clinic x 2, instructed in leading down with L, up with R to avoid weight bearing in flexion on L knee. 12/08/22 LAQ 2# 2x10  HS curls green 2x10  Fitter pushes 2 blue band 2x10 STS x10 SAQ 2x10 SLR 2x10 NuStep L4 x25mins    12/05/22:  Supine for heel slides, L LE, to tolerance 10 reps, measured 73 degrees Added flat sheet around L  foot, for assisted heel slides, pt to replicate at home Supine quad sets L with  towel roll under knee for tactile cuing SLR L , no brace, able to manage without lag, over 10 reps Seated L knee flexion, pt using flat sheet to support L foot Standing with brace on for heel/toe rocks Standing with brace within walker for L hip flex and abd All ex x 10 reps.  Applied ice R ant knee after therex to alleviate edema and soreness.  11/30/22:  Therapeutic activities:   Spent a good portion of today's session repositioning the Donjoy  brace and trying to make it more comfortable, removed the ace wrap and added non adherent guaze that the pt brought with her to cover her steristrips and sensitive area L medial thigh, then repositioned the splint.  Therex:  pt performed quad sets, with brace open and L leg resting on table, also SLR with and without the brace Patient instructed in and performed ankle pumps with blue theraband. 15 x , provided with band to use at home.    11/28/22: Evaluation, primarily spent most of time repositioning and educating the pt in correct positioning of the [facility] joy splint, marked correct position with marker on pts leg for her. Inst in quad sets within brace.  Called orthopedist for specific precautions regarding pts progression with PT    PATIENT EDUCATION:  Education details: POC, goals Person educated: Patient Education method: Explanation, Demonstration, and Tactile cues Education comprehension: verbalized understanding, returned demonstration, and verbal cues required  HOME EXERCISE PROGRAM: Inst in correct position of brace . And in quad sets   ASSESSMENT:  CLINICAL IMPRESSION: Patient is a 67 y.o. female who participated in skilled physical therapy session due to L meniscus tear and medial femoral condyle debridement.  She continues to reports tissue irritability L knee both medial and lateral. Today due to her reports of consistent L groin/ ant hip pain, we tried to emphasize more straight plane movements and build up her bulk strength L  quads and hamstrings, which she tolerated well.  She still has some occasional balance loss with resisted gait .  She is to see her orthopedist in 2 weeks.  In the mean time we will continue to try to improve her functional strength and hopefully reduce her tissue irritation and inflammation, pain.  OBJECTIVE IMPAIRMENTS: decreased activity tolerance, decreased balance, decreased endurance, decreased knowledge of condition, decreased mobility, difficulty walking, decreased ROM, decreased strength, increased edema, impaired perceived functional ability, impaired flexibility, and pain.   ACTIVITY LIMITATIONS: carrying, lifting, bending, standing, squatting, and stairs  PARTICIPATION LIMITATIONS: meal prep, cleaning, laundry, driving, shopping, community activity, and yard work  PERSONAL FACTORS: Behavior pattern, Education, Fitness, Past/current experiences, and 3+ comorbidities: major depressive disorder, chronic hepititis, multiple hip and shoulder injuries and arthroscopic repairs  are also affecting patient's functional outcome.   REHAB POTENTIAL: Fair    CLINICAL DECISION MAKING: Evolving/moderate complexity  EVALUATION COMPLEXITY: Moderate   GOALS: Goals reviewed with patient? Yes  SHORT TERM GOALS: Target date:  2 weeks 12/05/22 I HEP Baseline: Goal status: met   LONG TERM GOALS: Target date: 03/08/23, extended today for additional 6 weeks, 1-2 x week  Tegner Lysholm Knee Score: 19 / 100 = 19.0 % improve to 80/100 01/16/23: Tegner Lysholm Knee Score: 59 / 100 = 59.0 %   Goal status: progressing  2.  Improve L knee flexibility to 0 to 120 Baseline: flex NT, ext 0 Goal status: 01/16/23: 0 to 136, met   3.  L LE strength 5/5 for normal transfers,functional gait Baseline:  Goal status: 01/16/23: L knee strength quads, hamstrings 5/5, ankle and hip 4+/5   4.  30 sec sit to stand 14 reps or greater for normal transfers, gait Baseline:  Goal status: 01/16/23; progressing, 10 reps  without hand support     PLAN:  PT FREQUENCY: 1-2x/week  PT DURATION: 6 weeks  PLANNED INTERVENTIONS: 97110-Therapeutic exercises, 97530- Therapeutic activity, 97112- Neuromuscular re-education, 97535- Self Care, 33295- Manual therapy, and 97014- Electrical stimulation (unattended)  PLAN FOR NEXT SESSION: continue to  advance more strengthening , stability training , address mechanics with functional mobility  Stacy Sailer L Johnney Scarlata, PT, DPT, OCS 02/01/2023, 5:04 PM

## 2023-02-06 ENCOUNTER — Other Ambulatory Visit: Payer: Self-pay

## 2023-02-06 ENCOUNTER — Ambulatory Visit: Payer: Medicare Other

## 2023-02-06 ENCOUNTER — Ambulatory Visit: Payer: Medicare Other | Attending: Orthopedic Surgery

## 2023-02-06 DIAGNOSIS — G8929 Other chronic pain: Secondary | ICD-10-CM | POA: Diagnosis present

## 2023-02-06 DIAGNOSIS — M25662 Stiffness of left knee, not elsewhere classified: Secondary | ICD-10-CM | POA: Diagnosis present

## 2023-02-06 DIAGNOSIS — R262 Difficulty in walking, not elsewhere classified: Secondary | ICD-10-CM | POA: Diagnosis present

## 2023-02-06 DIAGNOSIS — M25562 Pain in left knee: Secondary | ICD-10-CM | POA: Insufficient documentation

## 2023-02-06 NOTE — Therapy (Signed)
OUTPATIENT PHYSICAL THERAPY LOWER EXTREMITY TREATMENT       Patient Name: Toni [name] MRN: [id number] DOB:1955/06/10, 68 y.o., female Today's Date: 02/06/2023  END OF SESSION:  PT End of Session - 02/06/23 1430     Visit Number 13    Date for PT Re-Evaluation 03/08/23    Progress Note Due on Visit 20    PT Start Time 1431    PT Stop Time 1515    PT Time Calculation (min) 44 min    Activity Tolerance Patient tolerated treatment well    Behavior During Therapy [facility] for tasks assessed/performed;Anxious                Past Medical History:  Diagnosis Date   Brachial neuritis or radiculitis NOS    Chronic hepatitis C (HCC) 10/05/2015   treated and rsolved   Depressive disorder, not elsewhere classified    Essential and other specified forms of tremor    Fracture of 5th metatarsal 08/13/2017   Fracture of multiple pubic rami (HCC) 08/13/2017   Headache(784.0)    Hepatitis C infection 12/05/2017   Treated and now resolved and "cured " as of early 2018  And      Memory loss    better see evaluation   Microscopic hematuria    seed dr Patsi Sears in past   Migraine, unspecified, without mention of intractable migraine without mention of status migrainosus    Multiple rib fractures 08/13/2017   Other specified disorders of rotator cuff syndrome of shoulder and allied disorders    Vulvar pain    rx with valium supp   Past Surgical History:  Procedure Laterality Date   BREAST EXCISIONAL BIOPSY Right    No scar   CESAREAN SECTION     x3   SHOULDER ARTHROSCOPY W/ ROTATOR CUFF REPAIR Right 2004   SHOULDER ARTHROSCOPY W/ ROTATOR CUFF REPAIR Left 2008   Patient Active Problem List   Diagnosis Date Noted   Easy bruising 03/21/2022   Common migraine with intractable migraine 12/10/2019   Chronic pain 08/13/2017   Occipital neuralgia of right side 10/21/2016   Chronic migraine 01/26/2015   Cervicogenic headache 12/16/2014   Subclinical hypothyroidism  12/16/2014   Memory loss 10/08/2014   Depression 10/08/2014   Generalized anxiety disorder 10/08/2014   Tremor 10/08/2014   Major depressive disorder, single episode, moderate (HCC) 08/27/2014   Memory difficulties 08/27/2014   Degenerative disc disease, cervical 08/27/2014   Chronically on opiate therapy 08/27/2014    PCP: Madelin Headings  REFERRING PROVIDER: Duwayne Heck, MD, [facility]  REFERRING DIAG: s/p arthroscopy , partial lateral meniscectomy, chrondroplasty L medial femoral condyle, medial meniscus repair.   THERAPY DIAG:  Difficulty in walking, not elsewhere classified  Decreased ROM of left knee  Chronic pain of left knee  Rationale for Evaluation and Treatment: Rehabilitation  ONSET DATE: 11/21/22  SUBJECTIVE:   SUBJECTIVE STATEMENT: 02/06/23:  my L knee hurts really badly in the front , points to L lateral knee along tibial jt line. Hurts when trying to get into tailor position L , unable to reach foot/ shoe.    EVAL:The patient has many concerns:  having to put more weight on L Leg than she is supposed to, poor fit of L DonJoy brace, unable to reach her L foot to don/doff sock, unable to manage the brace by herself, lots of pain and bruising L ankle lat and med knee, some from the brace  PERTINENT HISTORY: Patient with chronic L  knee pain, underwent surgery as described above, referred for outpt PT PAIN:  Are you having pain? Yes: NPRS scale: 7/10 Pain location: L knee, lower leg, ankle Pain description: constant Aggravating factors: the brace rubs, walking, weight bearing Relieving factors: elevating leg  PRECAUTIONS: Other: 50% weight bearing, until 2 weeks post op, Dec 05, 2022, no weight bearing flexion L knee until 6 weeks 01/02/23, ROM out of the brace 2 weeks post op, Dec 05, 2022  RED FLAGS: None   WEIGHT BEARING RESTRICTIONS: Yes 50% for initial 6 weeks, to 01/02/23  FALLS:  Has patient fallen in last 6 months? Yes. Number of falls  unclear  LIVING ENVIRONMENT: Lives with: lives alone Lives in: House/apartment Stairs: No Has following equipment at home: Single point cane and Environmental consultant - 2 wheeled  OCCUPATION: retired   PLOF: Independent with basic ADLs  PATIENT GOALS: get to full independence , able to maintain I at  my town home  NEXT MD VISIT: Dec 07 2022  OBJECTIVE:  Note: Objective measures were completed at Evaluation unless otherwise noted.  DIAGNOSTIC FINDINGS: na, see op note in media  PATIENT SURVEYS:  Tegner Lysholm Knee Score: 19 / 100 = 19.0 %   COGNITION: Overall cognitive status: Within functional limits for tasks assessed     SENSATION: WFL  EDEMA:  Mild edema, with multiple healing hematomas entire L LE  POSTURE: rounded shoulders, forward head, and Don joy brace  L LE, brace 2" too low, distally positioned.  Has ace wrap under brace with multiple areas creating indentation skin L leg  PALPATION: Tender along entire L tibia, with patellar mobs, medial and lateral thigh. No warmth or redness noted skin  LOWER EXTREMITY ZOX:WRUEAV to assess L knee ROM due to surgical restrictions 12/26/22: L knee flexion 127, ext 0 01/16/23: L knee flexion 135, ext 0  LOWER EXTREMITY MMT: able to contract L quads, able to perform SLR within the brace. MMT LE L quads, hamstrings wnl, slight atrophy noted L gastrocs   LOWER EXTREMITY SPECIAL TESTS:  na  FUNCTIONAL TESTS:  Patient able to get on and off treatment table I and able to move sit to stand with r walker with SBA .  Very slow, guarded with all movements  GAIT: Distance walked: 5' in clinic Assistive device utilized: Walker - 2 wheeled Level of assistance: Modified independence Comments: patient is supposed to be 50% wbing, unclear whether she is really adhering to this restriction.  Walks with step to pattern with walker, leading with L LE, L knee extended in immobilizer   TODAY'S TREATMENT:                                                                                                                               DATE: 02/06/23: Kinesiotaping L ant knee, 2-I pieces, extending from tibial tubercle to prox ant thigh Sit to stand from elevated mat , heels on black bar, 2 sets 10 Knee  flexion B 25# 3 x 10 Knee ext B 5# 3 x 10 Resisted gait, x 5 reps each for/ back , side/side B heel raises with 2 # ankle wts 20 x Standing hip ext with ER 30 degrees, 10 x each 2# cuff wts ankles  Forward step ups on 4" step with B hand rails, opposite knee driver 10 reps  7/82/95: In ll bars for heel/toe rocks,with 2 # cuff wts ankles B knee flexion 25# B knee ext 10# Sit to stand with heels on bar from hi/low table, 15x Resisted gait 20# 5 reps f and 5 reps back  Ice L knee with L LE elevated for 10 min at end of session.  01/25/23: Initially in ll bars for balance/stability activities including rocker board side to side,  progressed to 15 reps without UE support, tends to lock knees into extension Heel/toe rocks on airex 15 x Resisted gait 5 reps each direction with 10# Nustep level 5 Ue's and LE's, 6 min cues to avoid valgum L knee  Sit to stand from elevated table with red tband around thighs for tactile cue to engage B hip abductors and avoid valgum movement B knees.  Mini lunges L foot on compliant side of bosu, with B UE support, again trying to avoid valgum L knee  01/16/23: Reassessed pt for 10th visit goals, including 30 sec sit to stand, ROM, MMT  The patient was instructed in progression to exercises to emphasize stability B LE's as follows: Gait: advised to shorten B stride length as patient is walking with a large step length and is impacting the ground on heel strike L with L knee hyperextended, by reducing stride length she is able to absorb shock with heel strike better.  Resisted gait, 10# 5 reps F, B and side, with CGA throughout.   Heel/toe rocks on airex to engage righting reactions and B ankle musculature  recruitment  01/08/22: Nustep level 4 LE's only x 5 min for mid range rocking, tissue perfusion 4# long arc quads  Seated hamstring curls green t band Standing L hip abd with 2# wt , 10 reps, 2 sets  Supine bridging over ball, 20 reps   Vasopneumatic x 10 min 34 degrees, with L Le elevated on wedge  12/26/22: Supine for NMES [place] current, x 7 min while performing L quad sets with L knee over 1/2 foam roller Long arc quads L 3#  Seated knee flexion 25#, B 15 x 3  12/21/22:  Gait with st cane, utilizing some circumduction L LE, Nustep level 3, managed for 3 min then stopped due to pain Supine for NMES 4 pads, [place], L distal quads, in combination with quad sets over small 1/2 roller Supine for vasopneumatic x 10 min with L LE elevated on wedge  12/19/22: Nustep level 5 6 min LE only Gait training with st cane in R hand, with good pattern noted, mild decreased L hip ext in mid and terminal stance Bknee ext , 5 # B up, L LE eccentric lowering 15 reps Seated B knee flexion 20# 15 reps Standing with 2# cuff weights each ankle for alt hip abd, alt hip flex, alt hip ext Standing heel/ toe rocks on airex in ll bars Standing tandem with leading foot on airex with horizontal head turns for proprioceptive training  12/12/22: Nustep L5 LE's only Supine SLR foot up Supine SLR L LE ER 30 degrees Seated LAQ's 3#  Seated hamstring curls green, stopped after 2 attempts, 5 reps  each, due to pain L knee medial jt line Standing for L LE 3 way hip 2# ankle wt L LE, 10 reps each Heel/ toe rocks on airex in ll bars to maintain PWB L Heel raises with ball squeeze ankles to engage medial stabilizers Heel /toe raises with isometric resistance from red t band around ankles 20 reps, hands on bars to maintain PWB  Stair training, practiced up /down 3 steps in clinic x 2, instructed in leading down with L, up with R to avoid weight bearing in flexion on L knee. 12/08/22 LAQ 2# 2x10  HS curls green  2x10  Fitter pushes 2 blue band 2x10 STS x10 SAQ 2x10 SLR 2x10 NuStep L4 x75mins    12/05/22:  Supine for heel slides, L LE, to tolerance 10 reps, measured 73 degrees Added flat sheet around L  foot, for assisted heel slides, pt to replicate at home Supine quad sets L with towel roll under knee for tactile cuing SLR L , no brace, able to manage without lag, over 10 reps Seated L knee flexion, pt using flat sheet to support L foot Standing with brace on for heel/toe rocks Standing with brace within walker for L hip flex and abd All ex x 10 reps.  Applied ice R ant knee after therex to alleviate edema and soreness.  11/30/22:  Therapeutic activities:   Spent a good portion of today's session repositioning the Donjoy  brace and trying to make it more comfortable, removed the ace wrap and added non adherent guaze that the pt brought with her to cover her steristrips and sensitive area L medial thigh, then repositioned the splint.  Therex:  pt performed quad sets, with brace open and L leg resting on table, also SLR with and without the brace Patient instructed in and performed ankle pumps with blue theraband. 15 x , provided with band to use at home.    11/28/22: Evaluation, primarily spent most of time repositioning and educating the pt in correct positioning of the [facility] joy splint, marked correct position with marker on pts leg for her. Inst in quad sets within brace.  Called orthopedist for specific precautions regarding pts progression with PT    PATIENT EDUCATION:  Education details: POC, goals Person educated: Patient Education method: Explanation, Demonstration, and Tactile cues Education comprehension: verbalized understanding, returned demonstration, and verbal cues required  HOME EXERCISE PROGRAM: Inst in correct position of brace . And in quad sets   ASSESSMENT:  CLINICAL IMPRESSION: Patient is a 68 y.o. female who participated in skilled physical therapy session due  to L meniscus tear and medial femoral condyle debridement.  She continues to reports tissue irritability L knee both medial and lateral. Palpation of her L knee today reveals some increased sensitivity L lat/inferior patellar border.  No obvious color changes or edema noted L knee.  Utilized kinesiotaping  to assist with reducing compressive forces under her patella at beginning of session to which she responded well .    She is to see her orthopedist in 1 weeks.  In the mean time we will continue to try to improve her functional strength and hopefully reduce her tissue irritation and inflammation, pain.  OBJECTIVE IMPAIRMENTS: decreased activity tolerance, decreased balance, decreased endurance, decreased knowledge of condition, decreased mobility, difficulty walking, decreased ROM, decreased strength, increased edema, impaired perceived functional ability, impaired flexibility, and pain.   ACTIVITY LIMITATIONS: carrying, lifting, bending, standing, squatting, and stairs  PARTICIPATION LIMITATIONS: meal prep, cleaning, laundry, driving, shopping,  community activity, and yard work  PERSONAL FACTORS: Behavior pattern, Education, Fitness, Past/current experiences, and 3+ comorbidities: major depressive disorder, chronic hepititis, multiple hip and shoulder injuries and arthroscopic repairs  are also affecting patient's functional outcome.   REHAB POTENTIAL: Fair    CLINICAL DECISION MAKING: Evolving/moderate complexity  EVALUATION COMPLEXITY: Moderate   GOALS: Goals reviewed with patient? Yes  SHORT TERM GOALS: Target date: 2 weeks 12/05/22 I HEP Baseline: Goal status: met   LONG TERM GOALS: Target date: 03/08/23, extended today for additional 6 weeks, 1-2 x week  Tegner Lysholm Knee Score: 19 / 100 = 19.0 % improve to 80/100 01/16/23: Tegner Lysholm Knee Score: 59 / 100 = 59.0 %   Goal status: progressing  2.  Improve L knee flexibility to 0 to 120 Baseline: flex NT, ext 0 Goal status:  01/16/23: 0 to 136, met   3.  L LE strength 5/5 for normal transfers,functional gait Baseline:  Goal status: 01/16/23: L knee strength quads, hamstrings 5/5, ankle and hip 4+/5   4.  30 sec sit to stand 14 reps or greater for normal transfers, gait Baseline:  Goal status: 01/16/23; progressing, 10 reps without hand support     PLAN:  PT FREQUENCY: 1-2x/week  PT DURATION: 6 weeks  PLANNED INTERVENTIONS: 97110-Therapeutic exercises, 97530- Therapeutic activity, 97112- Neuromuscular re-education, 97535- Self Care, 14782- Manual therapy, and 97014- Electrical stimulation (unattended)  PLAN FOR NEXT SESSION: continue to  advance more strengthening , stability training , address mechanics with functional mobility  Jayma Volpi L Sevon Rotert, PT, DPT, OCS 02/06/2023, 4:51 PM

## 2023-02-08 ENCOUNTER — Other Ambulatory Visit: Payer: Self-pay

## 2023-02-08 ENCOUNTER — Ambulatory Visit: Payer: Medicare Other

## 2023-02-08 DIAGNOSIS — R262 Difficulty in walking, not elsewhere classified: Secondary | ICD-10-CM

## 2023-02-08 DIAGNOSIS — M25662 Stiffness of left knee, not elsewhere classified: Secondary | ICD-10-CM

## 2023-02-08 DIAGNOSIS — G8929 Other chronic pain: Secondary | ICD-10-CM

## 2023-02-08 NOTE — Therapy (Signed)
 OUTPATIENT PHYSICAL THERAPY LOWER EXTREMITY TREATMENT       Patient Name: Toni [name] MRN: [id number] DOB:May 08, 1955, 68 y.o., female Today's Date: 02/09/2023  END OF SESSION:  PT End of Session - 02/08/23 1433     Visit Number 14    Date for PT Re-Evaluation 03/08/23    Progress Note Due on Visit 20    PT Start Time 1435    PT Stop Time 1515    PT Time Calculation (min) 40 min    Activity Tolerance Patient limited by pain    Behavior During Therapy [facility] for tasks assessed/performed;Anxious                 Past Medical History:  Diagnosis Date   Brachial neuritis or radiculitis NOS    Chronic hepatitis C (HCC) 10/05/2015   treated and rsolved   Depressive disorder, not elsewhere classified    Essential and other specified forms of tremor    Fracture of 5th metatarsal 08/13/2017   Fracture of multiple pubic rami (HCC) 08/13/2017   Headache(784.0)    Hepatitis C infection 12/05/2017   Treated and now resolved and cured  as of early 2018  And      Memory loss    better see evaluation   Microscopic hematuria    seed dr chales in past   Migraine, unspecified, without mention of intractable migraine without mention of status migrainosus    Multiple rib fractures 08/13/2017   Other specified disorders of rotator cuff syndrome of shoulder and allied disorders    Vulvar pain    rx with valium  supp   Past Surgical History:  Procedure Laterality Date   BREAST EXCISIONAL BIOPSY Right    No scar   CESAREAN SECTION     x3   SHOULDER ARTHROSCOPY W/ ROTATOR CUFF REPAIR Right 2004   SHOULDER ARTHROSCOPY W/ ROTATOR CUFF REPAIR Left 2008   Patient Active Problem List   Diagnosis Date Noted   Easy bruising 03/21/2022   Common migraine with intractable migraine 12/10/2019   Chronic pain 08/13/2017   Occipital neuralgia of right side 10/21/2016   Chronic migraine 01/26/2015   Cervicogenic headache 12/16/2014   Subclinical hypothyroidism 12/16/2014    Memory loss 10/08/2014   Depression 10/08/2014   Generalized anxiety disorder 10/08/2014   Tremor 10/08/2014   Major depressive disorder, single episode, moderate (HCC) 08/27/2014   Memory difficulties 08/27/2014   Degenerative disc disease, cervical 08/27/2014   Chronically on opiate therapy 08/27/2014    PCP: Charlett Apolinar POUR  REFERRING PROVIDER: Selinda Gosling, MD, [facility]  REFERRING DIAG: s/p arthroscopy , partial lateral meniscectomy, chrondroplasty L medial femoral condyle, medial meniscus repair.   THERAPY DIAG:  No diagnosis found.  Rationale for Evaluation and Treatment: Rehabilitation  ONSET DATE: 11/21/22  SUBJECTIVE:   SUBJECTIVE STATEMENT: 02/08/23:  in excruciating pain , L lat kneecap and medial knee jt line.  I liked the tape, still can't reach heel for lotion or cross my foot over my L thigh to put my socks and shoes on. My back really hurts, went for my pain clinic appt and they are updating my meds.   EVAL:The patient has many concerns:  having to put more weight on L Leg than she is supposed to, poor fit of L DonJoy brace, unable to reach her L foot to don/doff sock, unable to manage the brace by herself, lots of pain and bruising L ankle lat and med knee, some from the brace  PERTINENT  HISTORY: Patient with chronic L knee pain, underwent surgery as described above, referred for outpt PT PAIN:  Are you having pain? Yes: NPRS scale: 7/10 Pain location: L knee, lower leg, ankle Pain description: constant Aggravating factors: the brace rubs, walking, weight bearing Relieving factors: elevating leg  PRECAUTIONS: Other: 50% weight bearing, until 2 weeks post op, Dec 05, 2022, no weight bearing flexion L knee until 6 weeks 01/02/23, ROM out of the brace 2 weeks post op, Dec 05, 2022  RED FLAGS: None   WEIGHT BEARING RESTRICTIONS: Yes 50% for initial 6 weeks, to 01/02/23  FALLS:  Has patient fallen in last 6 months? Yes. Number of falls unclear  LIVING  ENVIRONMENT: Lives with: lives alone Lives in: House/apartment Stairs: No Has following equipment at home: Single point cane and Environmental Consultant - 2 wheeled  OCCUPATION: retired   PLOF: Independent with basic ADLs  PATIENT GOALS: get to full independence , able to maintain I at  my town home  NEXT MD VISIT: Dec 07 2022  OBJECTIVE:  Note: Objective measures were completed at Evaluation unless otherwise noted.  DIAGNOSTIC FINDINGS: na, see op note in media  PATIENT SURVEYS:  Tegner Lysholm Knee Score: 19 / 100 = 19.0 %   COGNITION: Overall cognitive status: Within functional limits for tasks assessed     SENSATION: WFL  EDEMA:  Mild edema, with multiple healing hematomas entire L LE  POSTURE: rounded shoulders, forward head, and Don joy brace  L LE, brace 2 too low, distally positioned.  Has ace wrap under brace with multiple areas creating indentation skin L leg  PALPATION: Tender along entire L tibia, with patellar mobs, medial and lateral thigh. No warmth or redness noted skin  LOWER EXTREMITY MNF:lwjaoz to assess L knee ROM due to surgical restrictions 12/26/22: L knee flexion 127, ext 0 01/16/23: L knee flexion 135, ext 0  LOWER EXTREMITY MMT: able to contract L quads, able to perform SLR within the brace. MMT LE L quads, hamstrings wnl, slight atrophy noted L gastrocs   LOWER EXTREMITY SPECIAL TESTS:  na  FUNCTIONAL TESTS:  Patient able to get on and off treatment table I and able to move sit to stand with r walker with SBA .  Very slow, guarded with all movements  GAIT: Distance walked: 69' in clinic Assistive device utilized: Walker - 2 wheeled Level of assistance: Modified independence Comments: patient is supposed to be 50% wbing, unclear whether she is really adhering to this restriction.  Walks with step to pattern with walker, leading with L LE, L knee extended in immobilizer   TODAY'S TREATMENT:                                                                                                                               DATE: 02/08/23:  Palpation L knee, very tender along lateral /inf patellar border, kinesiotape partially off L thigh so removed Patient very painful/ limited with tailor  position on L, points to lateral patellar border with this motion at about 65 degrees flexion Manual:  therapist provided L tibial IR stretch as pt flexed L knee in supine, repeated 10 bouts.  Improved motion with less pain lat patella with this stretch Nustep L5, 6 min, Ue's and LE's Kinesiotaping again L ant knee to assist with patellar tracking, 2 I pieces from tibial tubercle to ant thigh, surrounding patella Knee ext B 10# 3 x 10 Knee flexion B 15# 3 x 10   02/06/23: Kinesiotaping L ant knee, 2-I pieces, extending from tibial tubercle to prox ant thigh Sit to stand from elevated mat , heels on black bar, 2 sets 10 Knee flexion B 25# 3 x 10 Knee ext B 5# 3 x 10 Resisted gait, x 5 reps each for/ back , side/side B heel raises with 2 # ankle wts 20 x Standing hip ext with ER 30 degrees, 10 x each 2# cuff wts ankles  Forward step ups on 4 step with B hand rails, opposite knee driver 10 reps  8/70/74: In ll bars for heel/toe rocks,with 2 # cuff wts ankles B knee flexion 25# B knee ext 10# Sit to stand with heels on bar from hi/low table, 15x Resisted gait 20# 5 reps f and 5 reps back  Ice L knee with L LE elevated for 10 min at end of session.  01/25/23: Initially in ll bars for balance/stability activities including rocker board side to side,  progressed to 15 reps without UE support, tends to lock knees into extension Heel/toe rocks on airex 15 x Resisted gait 5 reps each direction with 10# Nustep level 5 Ue's and LE's, 6 min cues to avoid valgum L knee  Sit to stand from elevated table with red tband around thighs for tactile cue to engage B hip abductors and avoid valgum movement B knees.  Mini lunges L foot on compliant side of bosu, with B UE support,  again trying to avoid valgum L knee  01/16/23: Reassessed pt for 10th visit goals, including 30 sec sit to stand, ROM, MMT  The patient was instructed in progression to exercises to emphasize stability B LE's as follows: Gait: advised to shorten B stride length as patient is walking with a large step length and is impacting the ground on heel strike L with L knee hyperextended, by reducing stride length she is able to absorb shock with heel strike better.  Resisted gait, 10# 5 reps F, B and side, with CGA throughout.   Heel/toe rocks on airex to engage righting reactions and B ankle musculature recruitment  01/08/22: Nustep level 4 LE's only x 5 min for mid range rocking, tissue perfusion 4# long arc quads  Seated hamstring curls green t band Standing L hip abd with 2# wt , 10 reps, 2 sets  Supine bridging over ball, 20 reps   Vasopneumatic x 10 min 34 degrees, with L Le elevated on wedge  12/26/22: Supine for NMES Russian current, x 7 min while performing L quad sets with L knee over 1/2 foam roller Long arc quads L 3#  Seated knee flexion 25#, B 15 x 3  12/21/22:  Gait with st cane, utilizing some circumduction L LE, Nustep level 3, managed for 3 min then stopped due to pain Supine for NMES 4 pads, Russian, L distal quads, in combination with quad sets over small 1/2 roller Supine for vasopneumatic x 10 min with L LE elevated on wedge  12/19/22: Nustep  level 5 6 min LE only Gait training with st cane in R hand, with good pattern noted, mild decreased L hip ext in mid and terminal stance Bknee ext , 5 # B up, L LE eccentric lowering 15 reps Seated B knee flexion 20# 15 reps Standing with 2# cuff weights each ankle for alt hip abd, alt hip flex, alt hip ext Standing heel/ toe rocks on airex in ll bars Standing tandem with leading foot on airex with horizontal head turns for proprioceptive training  12/12/22: Nustep L5 LE's only Supine SLR foot up Supine SLR L LE ER 30  degrees Seated LAQ's 3#  Seated hamstring curls green, stopped after 2 attempts, 5 reps each, due to pain L knee medial jt line Standing for L LE 3 way hip 2# ankle wt L LE, 10 reps each Heel/ toe rocks on airex in ll bars to maintain PWB L Heel raises with ball squeeze ankles to engage medial stabilizers Heel /toe raises with isometric resistance from red t band around ankles 20 reps, hands on bars to maintain PWB  Stair training, practiced up /down 3 steps in clinic x 2, instructed in leading down with L, up with R to avoid weight bearing in flexion on L knee. 12/08/22 LAQ 2# 2x10  HS curls green 2x10  Fitter pushes 2 blue band 2x10 STS x10 SAQ 2x10 SLR 2x10 NuStep L4 x57mins    12/05/22:  Supine for heel slides, L LE, to tolerance 10 reps, measured 73 degrees Added flat sheet around L  foot, for assisted heel slides, pt to replicate at home Supine quad sets L with towel roll under knee for tactile cuing SLR L , no brace, able to manage without lag, over 10 reps Seated L knee flexion, pt using flat sheet to support L foot Standing with brace on for heel/toe rocks Standing with brace within walker for L hip flex and abd All ex x 10 reps.  Applied ice R ant knee after therex to alleviate edema and soreness.  11/30/22:  Therapeutic activities:   Spent a good portion of today's session repositioning the Donjoy  brace and trying to make it more comfortable, removed the ace wrap and added non adherent guaze that the pt brought with her to cover her steristrips and sensitive area L medial thigh, then repositioned the splint.  Therex:  pt performed quad sets, with brace open and L leg resting on table, also SLR with and without the brace Patient instructed in and performed ankle pumps with blue theraband. 15 x , provided with band to use at home.    11/28/22: Evaluation, primarily spent most of time repositioning and educating the pt in correct positioning of the [facility] joy splint, marked  correct position with marker on pts leg for her. Inst in quad sets within brace.  Called orthopedist for specific precautions regarding pts progression with PT    PATIENT EDUCATION:  Education details: POC, goals Person educated: Patient Education method: Explanation, Demonstration, and Tactile cues Education comprehension: verbalized understanding, returned demonstration, and verbal cues required  HOME EXERCISE PROGRAM: Inst in correct position of brace . And in quad sets   ASSESSMENT:  CLINICAL IMPRESSION: Patient is a 68 y.o. female who participated in skilled physical therapy session due to L meniscus tear and medial femoral condyle debridement.  She continues to reports tissue irritability L knee both medial and lateral. Palpation of her L knee today reveals continued sensitivity L lat/inferior patellar border.  No obvious  color changes or edema noted L knee.  Utilized kinesiotaping again today to assist with reducing compressive forces under her patella at beginning of session to which she responded well .    Continued to progress with strengthening L quads and hamstrings.  Did not utilize resisted gait today due to her increased lower back pain after last session.  Her ROM is still excellent L knee, still painful lateral knee in tailor position, also in L groin.  She is to see her orthopedist in 1 week.  In the mean time we will continue to try to improve her functional strength and hopefully reduce her tissue irritation and inflammation, pain L knee.  OBJECTIVE IMPAIRMENTS: decreased activity tolerance, decreased balance, decreased endurance, decreased knowledge of condition, decreased mobility, difficulty walking, decreased ROM, decreased strength, increased edema, impaired perceived functional ability, impaired flexibility, and pain.   ACTIVITY LIMITATIONS: carrying, lifting, bending, standing, squatting, and stairs  PARTICIPATION LIMITATIONS: meal prep, cleaning, laundry, driving,  shopping, community activity, and yard work  PERSONAL FACTORS: Behavior pattern, Education, Fitness, Past/current experiences, and 3+ comorbidities: major depressive disorder, chronic hepititis, multiple hip and shoulder injuries and arthroscopic repairs  are also affecting patient's functional outcome.   REHAB POTENTIAL: Fair    CLINICAL DECISION MAKING: Evolving/moderate complexity  EVALUATION COMPLEXITY: Moderate   GOALS: Goals reviewed with patient? Yes  SHORT TERM GOALS: Target date: 2 weeks 12/05/22 I HEP Baseline: Goal status: met   LONG TERM GOALS: Target date: 03/08/23, extended today for additional 6 weeks, 1-2 x week  Tegner Lysholm Knee Score: 19 / 100 = 19.0 % improve to 80/100 01/16/23: Tegner Lysholm Knee Score: 59 / 100 = 59.0 %   Goal status: progressing  2.  Improve L knee flexibility to 0 to 120 Baseline: flex NT, ext 0 Goal status: 01/16/23: 0 to 136, met   3.  L LE strength 5/5 for normal transfers,functional gait Baseline:  Goal status: 01/16/23: L knee strength quads, hamstrings 5/5, ankle and hip 4+/5   4.  30 sec sit to stand 14 reps or greater for normal transfers, gait Baseline:  Goal status: 01/16/23; progressing, 10 reps without hand support     PLAN:  PT FREQUENCY: 1-2x/week  PT DURATION: 6 weeks  PLANNED INTERVENTIONS: 97110-Therapeutic exercises, 97530- Therapeutic activity, 97112- Neuromuscular re-education, 97535- Self Care, [id number]- Manual therapy, and 97014- Electrical stimulation (unattended)  PLAN FOR NEXT SESSION: continue to  advance more strengthening , stability training , address mechanics with functional mobility  Chelsea Pedretti L Zinnia Tindall, PT, DPT, OCS 02/09/2023, 9:31 AM

## 2023-02-13 ENCOUNTER — Ambulatory Visit: Payer: Medicare Other

## 2023-02-15 ENCOUNTER — Other Ambulatory Visit: Payer: Self-pay

## 2023-02-15 ENCOUNTER — Ambulatory Visit: Payer: Medicare Other

## 2023-02-15 DIAGNOSIS — M25662 Stiffness of left knee, not elsewhere classified: Secondary | ICD-10-CM

## 2023-02-15 DIAGNOSIS — G8929 Other chronic pain: Secondary | ICD-10-CM

## 2023-02-15 DIAGNOSIS — R262 Difficulty in walking, not elsewhere classified: Secondary | ICD-10-CM

## 2023-02-15 NOTE — Therapy (Signed)
OUTPATIENT PHYSICAL THERAPY LOWER EXTREMITY TREATMENT       Patient Name: Toni [name] MRN: [id number] DOB:1955/09/29, 68 y.o., female Today's Date: 02/15/2023  END OF SESSION:  PT End of Session - 02/15/23 1430     Visit Number 15    Date for PT Re-Evaluation 03/08/23    Progress Note Due on Visit 20    PT Start Time 1430    PT Stop Time 1515    PT Time Calculation (min) 45 min                 Past Medical History:  Diagnosis Date   Brachial neuritis or radiculitis NOS    Chronic hepatitis C (HCC) 10/05/2015   treated and rsolved   Depressive disorder, not elsewhere classified    Essential and other specified forms of tremor    Fracture of 5th metatarsal 08/13/2017   Fracture of multiple pubic rami (HCC) 08/13/2017   Headache(784.0)    Hepatitis C infection 12/05/2017   Treated and now resolved and "cured " as of early 2018  And      Memory loss    better see evaluation   Microscopic hematuria    seed dr Patsi Sears in past   Migraine, unspecified, without mention of intractable migraine without mention of status migrainosus    Multiple rib fractures 08/13/2017   Other specified disorders of rotator cuff syndrome of shoulder and allied disorders    Vulvar pain    rx with valium supp   Past Surgical History:  Procedure Laterality Date   BREAST EXCISIONAL BIOPSY Right    No scar   CESAREAN SECTION     x3   SHOULDER ARTHROSCOPY W/ ROTATOR CUFF REPAIR Right 2004   SHOULDER ARTHROSCOPY W/ ROTATOR CUFF REPAIR Left 2008   Patient Active Problem List   Diagnosis Date Noted   Easy bruising 03/21/2022   Common migraine with intractable migraine 12/10/2019   Chronic pain 08/13/2017   Occipital neuralgia of right side 10/21/2016   Chronic migraine 01/26/2015   Cervicogenic headache 12/16/2014   Subclinical hypothyroidism 12/16/2014   Memory loss 10/08/2014   Depression 10/08/2014   Generalized anxiety disorder 10/08/2014   Tremor 10/08/2014    Major depressive disorder, single episode, moderate (HCC) 08/27/2014   Memory difficulties 08/27/2014   Degenerative disc disease, cervical 08/27/2014   Chronically on opiate therapy 08/27/2014    PCP: Madelin Headings  REFERRING PROVIDER: Duwayne Heck, MD, [facility]  REFERRING DIAG: s/p arthroscopy , partial lateral meniscectomy, chrondroplasty L medial femoral condyle, medial meniscus repair.   THERAPY DIAG:  Difficulty in walking, not elsewhere classified  Decreased ROM of left knee  Chronic pain of left knee  Rationale for Evaluation and Treatment: Rehabilitation  ONSET DATE: 11/21/22  SUBJECTIVE:   SUBJECTIVE STATEMENT: 02/15/23:  in excruciating pain , with L knee, still can't place my L foot on my R knee it just hurts.  I've started different pain meds.  Will see the orthopedic doctor tomorrow.   EVAL:The patient has many concerns:  having to put more weight on L Leg than she is supposed to, poor fit of L DonJoy brace, unable to reach her L foot to don/doff sock, unable to manage the brace by herself, lots of pain and bruising L ankle lat and med knee, some from the brace  PERTINENT HISTORY: Patient with chronic L knee pain, underwent surgery as described above, referred for outpt PT PAIN:  Are you having pain? Yes: NPRS scale:  7/10 Pain location: L knee, lower leg, ankle Pain description: constant Aggravating factors: the brace rubs, walking, weight bearing Relieving factors: elevating leg  PRECAUTIONS: Other: 50% weight bearing, until 2 weeks post op, Dec 05, 2022, no weight bearing flexion L knee until 6 weeks 01/02/23, ROM out of the brace 2 weeks post op, Dec 05, 2022  RED FLAGS: None   WEIGHT BEARING RESTRICTIONS: Yes 50% for initial 6 weeks, to 01/02/23  FALLS:  Has patient fallen in last 6 months? Yes. Number of falls unclear  LIVING ENVIRONMENT: Lives with: lives alone Lives in: House/apartment Stairs: No Has following equipment at home: Single  point cane and Environmental consultant - 2 wheeled  OCCUPATION: retired   PLOF: Independent with basic ADLs  PATIENT GOALS: get to full independence , able to maintain I at  my town home  NEXT MD VISIT: Dec 07 2022  OBJECTIVE:  Note: Objective measures were completed at Evaluation unless otherwise noted.  DIAGNOSTIC FINDINGS: na, see op note in media  PATIENT SURVEYS:  Tegner Lysholm Knee Score: 19 / 100 = 19.0 %   COGNITION: Overall cognitive status: Within functional limits for tasks assessed     SENSATION: WFL  EDEMA:  Mild edema, with multiple healing hematomas entire L LE  POSTURE: rounded shoulders, forward head, and Don joy brace  L LE, brace 2" too low, distally positioned.  Has ace wrap under brace with multiple areas creating indentation skin L leg  PALPATION: Tender along entire L tibia, with patellar mobs, medial and lateral thigh. No warmth or redness noted skin  LOWER EXTREMITY WJX:BJYNWG to assess L knee ROM due to surgical restrictions 12/26/22: L knee flexion 127, ext 0 01/16/23: L knee flexion 135, ext 0  LOWER EXTREMITY MMT: able to contract L quads, able to perform SLR within the brace. MMT LE L quads, hamstrings wnl, slight atrophy noted L gastrocs   LOWER EXTREMITY SPECIAL TESTS:  na  FUNCTIONAL TESTS:  Patient able to get on and off treatment table I and able to move sit to stand with r walker with SBA .  Very slow, guarded with all movements  GAIT: Distance walked: 23' in clinic Assistive device utilized: Walker - 2 wheeled Level of assistance: Modified independence Comments: patient is supposed to be 50% wbing, unclear whether she is really adhering to this restriction.  Walks with step to pattern with walker, leading with L LE, L knee extended in immobilizer   TODAY'S TREATMENT:                                                                                                                              DATE: 02/15/23:  Therapeutic activities, manual  techniques, therex to improve L knee flexibility and B LE stability Rom L knee flex:142 L knee ext: 0 Palpation still very sensitive L lateral inf patellar border Nustep level 5, x 6 min Supine on mat for manual stretching into L knee flexion, with  distraction prox tibia and IR assist at terminal flexion, 5 sec holds at end range, pain free, multiple reps  Seated for isometric B knee extension at 75 degrees flex and wt stack maxed out, cued for 20% to 50% contraction, 10 sec holds x 8 reps. B knee ext 10# 2 x 15 B knee flexion 35# 10x, 3 sets B leg press 20# 10x , 3 sets  Standing with forefeet on bar for B plantarflexor stretch and heel raises RDL's staggered stance L foot slightly forward, 7 reps  02/08/23:  Palpation L knee, very tender along lateral /inf patellar border, kinesiotape partially off L thigh so removed Patient very painful/ limited with tailor position on L, points to lateral patellar border with this motion at about 65 degrees flexion Manual:  therapist provided L tibial IR stretch as pt flexed L knee in supine, repeated 10 bouts.  Improved motion with less pain lat patella with this stretch Nustep L5, 6 min, Ue's and LE's Kinesiotaping again L ant knee to assist with patellar tracking, 2 I pieces from tibial tubercle to ant thigh, surrounding patella Knee ext B 10# 3 x 10 Knee flexion B 15# 3 x 10   02/06/23: Kinesiotaping L ant knee, 2-I pieces, extending from tibial tubercle to prox ant thigh Sit to stand from elevated mat , heels on black bar, 2 sets 10 Knee flexion B 25# 3 x 10 Knee ext B 5# 3 x 10 Resisted gait, x 5 reps each for/ back , side/side B heel raises with 2 # ankle wts 20 x Standing hip ext with ER 30 degrees, 10 x each 2# cuff wts ankles  Forward step ups on 4" step with B hand rails, opposite knee driver 10 reps  1/61/09: In ll bars for heel/toe rocks,with 2 # cuff wts ankles B knee flexion 25# B knee ext 10# Sit to stand with heels on bar from  hi/low table, 15x Resisted gait 20# 5 reps f and 5 reps back  Ice L knee with L LE elevated for 10 min at end of session.  01/25/23: Initially in ll bars for balance/stability activities including rocker board side to side,  progressed to 15 reps without UE support, tends to lock knees into extension Heel/toe rocks on airex 15 x Resisted gait 5 reps each direction with 10# Nustep level 5 Ue's and LE's, 6 min cues to avoid valgum L knee  Sit to stand from elevated table with red tband around thighs for tactile cue to engage B hip abductors and avoid valgum movement B knees.  Mini lunges L foot on compliant side of bosu, with B UE support, again trying to avoid valgum L knee  01/16/23: Reassessed pt for 10th visit goals, including 30 sec sit to stand, ROM, MMT  The patient was instructed in progression to exercises to emphasize stability B LE's as follows: Gait: advised to shorten B stride length as patient is walking with a large step length and is impacting the ground on heel strike L with L knee hyperextended, by reducing stride length she is able to absorb shock with heel strike better.  Resisted gait, 10# 5 reps F, B and side, with CGA throughout.   Heel/toe rocks on airex to engage righting reactions and B ankle musculature recruitment  01/08/22: Nustep level 4 LE's only x 5 min for mid range rocking, tissue perfusion 4# long arc quads  Seated hamstring curls green t band Standing L hip abd with 2# wt , 10  reps, 2 sets  Supine bridging over ball, 20 reps   Vasopneumatic x 10 min 34 degrees, with L Le elevated on wedge  12/26/22: Supine for NMES [place] current, x 7 min while performing L quad sets with L knee over 1/2 foam roller Long arc quads L 3#  Seated knee flexion 25#, B 15 x 3  12/21/22:  Gait with st cane, utilizing some circumduction L LE, Nustep level 3, managed for 3 min then stopped due to pain Supine for NMES 4 pads, [place], L distal quads, in combination with quad  sets over small 1/2 roller Supine for vasopneumatic x 10 min with L LE elevated on wedge  12/19/22: Nustep level 5 6 min LE only Gait training with st cane in R hand, with good pattern noted, mild decreased L hip ext in mid and terminal stance Bknee ext , 5 # B up, L LE eccentric lowering 15 reps Seated B knee flexion 20# 15 reps Standing with 2# cuff weights each ankle for alt hip abd, alt hip flex, alt hip ext Standing heel/ toe rocks on airex in ll bars Standing tandem with leading foot on airex with horizontal head turns for proprioceptive training  12/12/22: Nustep L5 LE's only Supine SLR foot up Supine SLR L LE ER 30 degrees Seated LAQ's 3#  Seated hamstring curls green, stopped after 2 attempts, 5 reps each, due to pain L knee medial jt line Standing for L LE 3 way hip 2# ankle wt L LE, 10 reps each Heel/ toe rocks on airex in ll bars to maintain PWB L Heel raises with ball squeeze ankles to engage medial stabilizers Heel /toe raises with isometric resistance from red t band around ankles 20 reps, hands on bars to maintain PWB  Stair training, practiced up /down 3 steps in clinic x 2, instructed in leading down with L, up with R to avoid weight bearing in flexion on L knee. 12/08/22 LAQ 2# 2x10  HS curls green 2x10  Fitter pushes 2 blue band 2x10 STS x10 SAQ 2x10 SLR 2x10 NuStep L4 x55mins    12/05/22:  Supine for heel slides, L LE, to tolerance 10 reps, measured 73 degrees Added flat sheet around L  foot, for assisted heel slides, pt to replicate at home Supine quad sets L with towel roll under knee for tactile cuing SLR L , no brace, able to manage without lag, over 10 reps Seated L knee flexion, pt using flat sheet to support L foot Standing with brace on for heel/toe rocks Standing with brace within walker for L hip flex and abd All ex x 10 reps.  Applied ice R ant knee after therex to alleviate edema and soreness.  11/30/22:  Therapeutic activities:   Spent a  good portion of today's session repositioning the Donjoy  brace and trying to make it more comfortable, removed the ace wrap and added non adherent guaze that the pt brought with her to cover her steristrips and sensitive area L medial thigh, then repositioned the splint.  Therex:  pt performed quad sets, with brace open and L leg resting on table, also SLR with and without the brace Patient instructed in and performed ankle pumps with blue theraband. 15 x , provided with band to use at home.    11/28/22: Evaluation, primarily spent most of time repositioning and educating the pt in correct positioning of the [facility] [place] joy splint, marked correct position with marker on pts leg for her. Inst in quad  sets within brace.  Called orthopedist for specific precautions regarding pts progression with PT    PATIENT EDUCATION:  Education details: POC, goals Person educated: Patient Education method: Explanation, Demonstration, and Tactile cues Education comprehension: verbalized understanding, returned demonstration, and verbal cues required  HOME EXERCISE PROGRAM: Inst in correct position of brace . And in quad sets   ASSESSMENT:  CLINICAL IMPRESSION: Patient is a 68 y.o. female who participated in skilled physical therapy session due to L meniscus tear and medial femoral condyle debridement.  She continues to reports tissue irritability L knee , primarily laterally.  Palpation of her L knee today reveals continued sensitivity L lat/inferior patellar border.  Deferred kinesiotaping due to some skin sensitivity.  No obvious color changes or edema noted L knee.    Continued to progress with strengthening L quads and hamstrings.  Added isometric knee extension today for pain management for L ant knee/ lat patella and infrapatellar tendon.  Her ROM is still excellent L knee, still painful lateral knee in tailor position, also in L groin.  She is to see her orthopedist tomorrow.  We will await further instruction  regarding her care after this visit.  OBJECTIVE IMPAIRMENTS: decreased activity tolerance, decreased balance, decreased endurance, decreased knowledge of condition, decreased mobility, difficulty walking, decreased ROM, decreased strength, increased edema, impaired perceived functional ability, impaired flexibility, and pain.   ACTIVITY LIMITATIONS: carrying, lifting, bending, standing, squatting, and stairs  PARTICIPATION LIMITATIONS: meal prep, cleaning, laundry, driving, shopping, community activity, and yard work  PERSONAL FACTORS: Behavior pattern, Education, Fitness, Past/current experiences, and 3+ comorbidities: major depressive disorder, chronic hepititis, multiple hip and shoulder injuries and arthroscopic repairs  are also affecting patient's functional outcome.   REHAB POTENTIAL: Fair    CLINICAL DECISION MAKING: Evolving/moderate complexity  EVALUATION COMPLEXITY: Moderate   GOALS: Goals reviewed with patient? Yes  SHORT TERM GOALS: Target date: 2 weeks 12/05/22 I HEP Baseline: Goal status: met   LONG TERM GOALS: Target date: 03/08/23, extended today for additional 6 weeks, 1-2 x week  Tegner Lysholm Knee Score: 19 / 100 = 19.0 % improve to 80/100 01/16/23: Tegner Lysholm Knee Score: 59 / 100 = 59.0 %   Goal status: progressing  2.  Improve L knee flexibility to 0 to 120 Baseline: flex NT, ext 0 Goal status: 01/16/23: 0 to 136, met   3.  L LE strength 5/5 for normal transfers,functional gait Baseline:  Goal status: 01/16/23: L knee strength quads, hamstrings 5/5, ankle and hip 4+/5   4.  30 sec sit to stand 14 reps or greater for normal transfers, gait Baseline:  Goal status: 01/16/23; progressing, 10 reps without hand support     PLAN:  PT FREQUENCY: 1-2x/week  PT DURATION: 6 weeks  PLANNED INTERVENTIONS: 97110-Therapeutic exercises, 97530- Therapeutic activity, 97112- Neuromuscular re-education, 97535- Self Care, 16109- Manual therapy, and [id number]-  Electrical stimulation (unattended)  PLAN FOR NEXT SESSION: continue to  advance more strengthening , stability training , address mechanics with functional mobility  Lettie Czarnecki L Breana Litts, PT, DPT, OCS 02/15/2023, 2:34 PM

## 2023-02-20 ENCOUNTER — Ambulatory Visit: Payer: Medicare Other

## 2023-02-20 ENCOUNTER — Other Ambulatory Visit: Payer: Self-pay

## 2023-02-20 DIAGNOSIS — G8929 Other chronic pain: Secondary | ICD-10-CM

## 2023-02-20 DIAGNOSIS — M25662 Stiffness of left knee, not elsewhere classified: Secondary | ICD-10-CM

## 2023-02-20 DIAGNOSIS — R262 Difficulty in walking, not elsewhere classified: Secondary | ICD-10-CM | POA: Diagnosis not present

## 2023-02-20 NOTE — Therapy (Signed)
OUTPATIENT PHYSICAL THERAPY LOWER EXTREMITY TREATMENT       Patient Name: Toni [name] MRN: [id number] DOB:08-30-1955, 68 y.o., female Today's Date: 02/20/2023  END OF SESSION:  PT End of Session - 02/20/23 1519     Visit Number 16    Date for PT Re-Evaluation 03/08/23    Progress Note Due on Visit 20    PT Start Time 1515    PT Stop Time 1600    PT Time Calculation (min) 45 min    Activity Tolerance Patient limited by pain    Behavior During Therapy [facility] for tasks assessed/performed;Anxious             Past Medical History:  Diagnosis Date   Brachial neuritis or radiculitis NOS    Chronic hepatitis C (HCC) 10/05/2015   treated and rsolved   Depressive disorder, not elsewhere classified    Essential and other specified forms of tremor    Fracture of 5th metatarsal 08/13/2017   Fracture of multiple pubic rami (HCC) 08/13/2017   Headache(784.0)    Hepatitis C infection 12/05/2017   Treated and now resolved and "cured " as of early 2018  And      Memory loss    better see evaluation   Microscopic hematuria    seed dr Patsi Sears in past   Migraine, unspecified, without mention of intractable migraine without mention of status migrainosus    Multiple rib fractures 08/13/2017   Other specified disorders of rotator cuff syndrome of shoulder and allied disorders    Vulvar pain    rx with valium supp   Past Surgical History:  Procedure Laterality Date   BREAST EXCISIONAL BIOPSY Right    No scar   CESAREAN SECTION     x3   SHOULDER ARTHROSCOPY W/ ROTATOR CUFF REPAIR Right 2004   SHOULDER ARTHROSCOPY W/ ROTATOR CUFF REPAIR Left 2008   Patient Active Problem List   Diagnosis Date Noted   Easy bruising 03/21/2022   Common migraine with intractable migraine 12/10/2019   Chronic pain 08/13/2017   Occipital neuralgia of right side 10/21/2016   Chronic migraine 01/26/2015   Cervicogenic headache 12/16/2014   Subclinical hypothyroidism 12/16/2014    Memory loss 10/08/2014   Depression 10/08/2014   Generalized anxiety disorder 10/08/2014   Tremor 10/08/2014   Major depressive disorder, single episode, moderate (HCC) 08/27/2014   Memory difficulties 08/27/2014   Degenerative disc disease, cervical 08/27/2014   Chronically on opiate therapy 08/27/2014    PCP: Madelin Headings  REFERRING PROVIDER: Duwayne Heck, MD, [facility]  REFERRING DIAG: s/p arthroscopy , partial lateral meniscectomy, chrondroplasty L medial femoral condyle, medial meniscus repair.   THERAPY DIAG:  Difficulty in walking, not elsewhere classified  Decreased ROM of left knee  Chronic pain of left knee  Rationale for Evaluation and Treatment: Rehabilitation  ONSET DATE: 11/21/22  SUBJECTIVE:   SUBJECTIVE STATEMENT: 02/20/23:  in a lot of pain, the injection that the ortho did worked for one night.   EVAL:The patient has many concerns:  having to put more weight on L Leg than she is supposed to, poor fit of L DonJoy brace, unable to reach her L foot to don/doff sock, unable to manage the brace by herself, lots of pain and bruising L ankle lat and med knee, some from the brace  PERTINENT HISTORY: Patient with chronic L knee pain, underwent surgery as described above, referred for outpt PT PAIN:  Are you having pain? Yes: NPRS scale: 7/10 Pain location:  L knee, lower leg, ankle Pain description: constant Aggravating factors: the brace rubs, walking, weight bearing Relieving factors: elevating leg  PRECAUTIONS: Other: 50% weight bearing, until 2 weeks post op, Dec 05, 2022, no weight bearing flexion L knee until 6 weeks 01/02/23, ROM out of the brace 2 weeks post op, Dec 05, 2022  RED FLAGS: None   WEIGHT BEARING RESTRICTIONS: Yes 50% for initial 6 weeks, to 01/02/23  FALLS:  Has patient fallen in last 6 months? Yes. Number of falls unclear  LIVING ENVIRONMENT: Lives with: lives alone Lives in: House/apartment Stairs: No Has following equipment at  home: Single point cane and Environmental consultant - 2 wheeled  OCCUPATION: retired   PLOF: Independent with basic ADLs  PATIENT GOALS: get to full independence , able to maintain I at  my town home  NEXT MD VISIT: Dec 07 2022  OBJECTIVE:  Note: Objective measures were completed at Evaluation unless otherwise noted.  DIAGNOSTIC FINDINGS: na, see op note in media  PATIENT SURVEYS:  Tegner Lysholm Knee Score: 19 / 100 = 19.0 %   COGNITION: Overall cognitive status: Within functional limits for tasks assessed     SENSATION: WFL  EDEMA:  Mild edema, with multiple healing hematomas entire L LE  POSTURE: rounded shoulders, forward head, and Don joy brace  L LE, brace 2" too low, distally positioned.  Has ace wrap under brace with multiple areas creating indentation skin L leg  PALPATION: Tender along entire L tibia, with patellar mobs, medial and lateral thigh. No warmth or redness noted skin  LOWER EXTREMITY NGE:[id number] to assess L knee ROM due to surgical restrictions 12/26/22: L knee flexion 127, ext 0 01/16/23: L knee flexion 135, ext 0  LOWER EXTREMITY MMT: able to contract L quads, able to perform SLR within the brace. MMT LE L quads, hamstrings wnl, slight atrophy noted L gastrocs   LOWER EXTREMITY SPECIAL TESTS:  na  FUNCTIONAL TESTS:  Patient able to get on and off treatment table I and able to move sit to stand with r walker with SBA .  Very slow, guarded with all movements  GAIT: Distance walked: 53' in clinic Assistive device utilized: Walker - 2 wheeled Level of assistance: Modified independence Comments: patient is supposed to be 50% wbing, unclear whether she is really adhering to this restriction.  Walks with step to pattern with walker, leading with L LE, L knee extended in immobilizer   TODAY'S TREATMENT:                                                                                                                              DATE: 02/20/23:  Nustep level 5, for 4  min 30 sec Supine for stretching L knee into flexion, therapist providing manual distraction of proximal tibia, and IR L tibia in terminal flexion Instructed and had pt perform seated isometric knee ext, in a chair, pushing against the wall 10 sec holds, 6 reps Leg  press, 20#, B x 25 reps Ice massage L lat patella over hypersensitive area Toe raises, rocks B forefeet on black bar, 20x Standing with 1 1/2# cuff wts for alt hip ext, cues to maintain knee extended  02/15/23:  Therapeutic activities, manual techniques, therex to improve L knee flexibility and B LE stability Rom L knee flex:142 L knee ext: 0 Palpation still very sensitive L lateral inf patellar border Nustep level 5, x 6 min Supine on mat for manual stretching into L knee flexion, with distraction prox tibia and IR assist at terminal flexion, 5 sec holds at end range, pain free, multiple reps  Seated for isometric B knee extension at 75 degrees flex and wt stack maxed out, cued for 20% to 50% contraction, 10 sec holds x 8 reps. B knee ext 10# 2 x 15 B knee flexion 35# 10x, 3 sets B leg press 20# 10x , 3 sets  Standing with forefeet on bar for B plantarflexor stretch and heel raises RDL's staggered stance L foot slightly forward, 7 reps  02/08/23:  Palpation L knee, very tender along lateral /inf patellar border, kinesiotape partially off L thigh so removed Patient very painful/ limited with tailor position on L, points to lateral patellar border with this motion at about 65 degrees flexion Manual:  therapist provided L tibial IR stretch as pt flexed L knee in supine, repeated 10 bouts.  Improved motion with less pain lat patella with this stretch Nustep L5, 6 min, Ue's and LE's Kinesiotaping again L ant knee to assist with patellar tracking, 2 I pieces from tibial tubercle to ant thigh, surrounding patella Knee ext B 10# 3 x 10 Knee flexion B 15# 3 x 10   02/06/23: Kinesiotaping L ant knee, 2-I pieces, extending from tibial  tubercle to prox ant thigh Sit to stand from elevated mat , heels on black bar, 2 sets 10 Knee flexion B 25# 3 x 10 Knee ext B 5# 3 x 10 Resisted gait, x 5 reps each for/ back , side/side B heel raises with 2 # ankle wts 20 x Standing hip ext with ER 30 degrees, 10 x each 2# cuff wts ankles  Forward step ups on 4" step with B hand rails, opposite knee driver 10 reps  2/53/66: In ll bars for heel/toe rocks,with 2 # cuff wts ankles B knee flexion 25# B knee ext 10# Sit to stand with heels on bar from hi/low table, 15x Resisted gait 20# 5 reps f and 5 reps back  Ice L knee with L LE elevated for 10 min at end of session.  01/25/23: Initially in ll bars for balance/stability activities including rocker board side to side,  progressed to 15 reps without UE support, tends to lock knees into extension Heel/toe rocks on airex 15 x Resisted gait 5 reps each direction with 10# Nustep level 5 Ue's and LE's, 6 min cues to avoid valgum L knee  Sit to stand from elevated table with red tband around thighs for tactile cue to engage B hip abductors and avoid valgum movement B knees.  Mini lunges L foot on compliant side of bosu, with B UE support, again trying to avoid valgum L knee  01/16/23: Reassessed pt for 10th visit goals, including 30 sec sit to stand, ROM, MMT  The patient was instructed in progression to exercises to emphasize stability B LE's as follows: Gait: advised to shorten B stride length as patient is walking with a large step length and is  impacting the ground on heel strike L with L knee hyperextended, by reducing stride length she is able to absorb shock with heel strike better.  Resisted gait, 10# 5 reps F, B and side, with CGA throughout.   Heel/toe rocks on airex to engage righting reactions and B ankle musculature recruitment  01/08/22: Nustep level 4 LE's only x 5 min for mid range rocking, tissue perfusion 4# long arc quads  Seated hamstring curls green t band Standing L hip  abd with 2# wt , 10 reps, 2 sets  Supine bridging over ball, 20 reps   Vasopneumatic x 10 min 34 degrees, with L Le elevated on wedge  12/26/22: Supine for NMES [place] current, x 7 min while performing L quad sets with L knee over 1/2 foam roller Long arc quads L 3#  Seated knee flexion 25#, B 15 x 3  12/21/22:  Gait with st cane, utilizing some circumduction L LE, Nustep level 3, managed for 3 min then stopped due to pain Supine for NMES 4 pads, [place], L distal quads, in combination with quad sets over small 1/2 roller Supine for vasopneumatic x 10 min with L LE elevated on wedge  12/19/22: Nustep level 5 6 min LE only Gait training with st cane in R hand, with good pattern noted, mild decreased L hip ext in mid and terminal stance Bknee ext , 5 # B up, L LE eccentric lowering 15 reps Seated B knee flexion 20# 15 reps Standing with 2# cuff weights each ankle for alt hip abd, alt hip flex, alt hip ext Standing heel/ toe rocks on airex in ll bars Standing tandem with leading foot on airex with horizontal head turns for proprioceptive training  12/12/22: Nustep L5 LE's only Supine SLR foot up Supine SLR L LE ER 30 degrees Seated LAQ's 3#  Seated hamstring curls green, stopped after 2 attempts, 5 reps each, due to pain L knee medial jt line Standing for L LE 3 way hip 2# ankle wt L LE, 10 reps each Heel/ toe rocks on airex in ll bars to maintain PWB L Heel raises with ball squeeze ankles to engage medial stabilizers Heel /toe raises with isometric resistance from red t band around ankles 20 reps, hands on bars to maintain PWB  Stair training, practiced up /down 3 steps in clinic x 2, instructed in leading down with L, up with R to avoid weight bearing in flexion on L knee. 12/08/22 LAQ 2# 2x10  HS curls green 2x10  Fitter pushes 2 blue band 2x10 STS x10 SAQ 2x10 SLR 2x10 NuStep L4 x71mins    12/05/22:  Supine for heel slides, L LE, to tolerance 10 reps, measured 73  degrees Added flat sheet around L  foot, for assisted heel slides, pt to replicate at home Supine quad sets L with towel roll under knee for tactile cuing SLR L , no brace, able to manage without lag, over 10 reps Seated L knee flexion, pt using flat sheet to support L foot Standing with brace on for heel/toe rocks Standing with brace within walker for L hip flex and abd All ex x 10 reps.  Applied ice R ant knee after therex to alleviate edema and soreness.  11/30/22:  Therapeutic activities:   Spent a good portion of today's session repositioning the Donjoy  brace and trying to make it more comfortable, removed the ace wrap and added non adherent guaze that the pt brought with her to cover her steristrips  and sensitive area L medial thigh, then repositioned the splint.  Therex:  pt performed quad sets, with brace open and L leg resting on table, also SLR with and without the brace Patient instructed in and performed ankle pumps with blue theraband. 15 x , provided with band to use at home.    11/28/22: Evaluation, primarily spent most of time repositioning and educating the pt in correct positioning of the [facility] joy splint, marked correct position with marker on pts leg for her. Inst in quad sets within brace.  Called orthopedist for specific precautions regarding pts progression with PT    PATIENT EDUCATION:  Education details: POC, goals Person educated: Patient Education method: Explanation, Demonstration, and Tactile cues Education comprehension: verbalized understanding, returned demonstration, and verbal cues required  HOME EXERCISE PROGRAM: Inst in correct position of brace . And in quad sets   ASSESSMENT:  CLINICAL IMPRESSION: Patient is a 68 y.o. female who participated in skilled physical therapy session due to L meniscus tear and medial femoral condyle debridement.  She continues to reports tissue irritability L knee , primarily laterally, in spite of an injection last  week with her follow up appt with the orthopedist.  Palpation of her L knee today reveals continued sensitivity L lat/inferior patellar border.      Continued to progress with strengthening L quads and hamstrings. Reviewed isometric knee extension today for pain management for L ant knee/ lat patella and infrapatellar tendon.  Her ROM is still excellent L knee, still painful lateral knee in tailor position, also in L groin.  Will plan to continue PT for remainder of her care plan which is through Mar 5.   OBJECTIVE IMPAIRMENTS: decreased activity tolerance, decreased balance, decreased endurance, decreased knowledge of condition, decreased mobility, difficulty walking, decreased ROM, decreased strength, increased edema, impaired perceived functional ability, impaired flexibility, and pain.   ACTIVITY LIMITATIONS: carrying, lifting, bending, standing, squatting, and stairs  PARTICIPATION LIMITATIONS: meal prep, cleaning, laundry, driving, shopping, community activity, and yard work  PERSONAL FACTORS: Behavior pattern, Education, Fitness, Past/current experiences, and 3+ comorbidities: major depressive disorder, chronic hepititis, multiple hip and shoulder injuries and arthroscopic repairs  are also affecting patient's functional outcome.   REHAB POTENTIAL: Fair    CLINICAL DECISION MAKING: Evolving/moderate complexity  EVALUATION COMPLEXITY: Moderate   GOALS: Goals reviewed with patient? Yes  SHORT TERM GOALS: Target date: 2 weeks 12/05/22 I HEP Baseline: Goal status: met   LONG TERM GOALS: Target date: 03/08/23, extended today for additional 6 weeks, 1-2 x week  Tegner Lysholm Knee Score: 19 / 100 = 19.0 % improve to 80/100 01/16/23: Tegner Lysholm Knee Score: 59 / 100 = 59.0 %   Goal status: progressing  2.  Improve L knee flexibility to 0 to 120 Baseline: flex NT, ext 0 Goal status: 01/16/23: 0 to 136, met   3.  L LE strength 5/5 for normal transfers,functional gait Baseline:   Goal status: 01/16/23: L knee strength quads, hamstrings 5/5, ankle and hip 4+/5   4.  30 sec sit to stand 14 reps or greater for normal transfers, gait Baseline:  Goal status: 01/16/23; progressing, 10 reps without hand support     PLAN:  PT FREQUENCY: 1-2x/week  PT DURATION: 6 weeks  PLANNED INTERVENTIONS: 97110-Therapeutic exercises, 97530- Therapeutic activity, 97112- Neuromuscular re-education, 97535- Self Care, 78295- Manual therapy, and 97014- Electrical stimulation (unattended)  PLAN FOR NEXT SESSION: continue to  advance more strengthening , stability training , address mechanics with functional mobility  Senay Sistrunk  L Joslynne Klatt, PT, DPT, OCS 02/20/2023, 5:25 PM

## 2023-02-22 ENCOUNTER — Ambulatory Visit: Payer: Medicare Other

## 2023-02-27 ENCOUNTER — Ambulatory Visit: Payer: Medicare Other | Admitting: Physical Therapy

## 2023-03-02 ENCOUNTER — Ambulatory Visit: Payer: Medicare Other | Admitting: Physical Therapy

## 2023-03-02 ENCOUNTER — Encounter: Payer: Self-pay | Admitting: Physical Therapy

## 2023-03-02 DIAGNOSIS — R262 Difficulty in walking, not elsewhere classified: Secondary | ICD-10-CM

## 2023-03-02 DIAGNOSIS — G8929 Other chronic pain: Secondary | ICD-10-CM

## 2023-03-02 DIAGNOSIS — M25662 Stiffness of left knee, not elsewhere classified: Secondary | ICD-10-CM

## 2023-03-02 NOTE — Therapy (Signed)
 OUTPATIENT PHYSICAL THERAPY LOWER EXTREMITY TREATMENT       Patient Name: Toni [name] MRN: [id number] DOB:[birth year], 68 y.o., female Today's Date: 03/02/2023  END OF SESSION:  PT End of Session - 03/02/23 1517     Visit Number 17    Date for PT Re-Evaluation 03/08/23    PT Start Time 1517    PT Stop Time 1600    PT Time Calculation (min) 43 min    Activity Tolerance Patient limited by pain    Behavior During Therapy [facility] for tasks assessed/performed;Anxious             Past Medical History:  Diagnosis Date   Brachial neuritis or radiculitis NOS    Chronic hepatitis C (HCC) 10/05/2015   treated and rsolved   Depressive disorder, not elsewhere classified    Essential and other specified forms of tremor    Fracture of 5th metatarsal 08/13/2017   Fracture of multiple pubic rami (HCC) 08/13/2017   Headache(784.0)    Hepatitis C infection 12/05/2017   Treated and now resolved and "cured " as of early 2018  And      Memory loss    better see evaluation   Microscopic hematuria    seed dr Patsi Sears in past   Migraine, unspecified, without mention of intractable migraine without mention of status migrainosus    Multiple rib fractures 08/13/2017   Other specified disorders of rotator cuff syndrome of shoulder and allied disorders    Vulvar pain    rx with valium supp   Past Surgical History:  Procedure Laterality Date   BREAST EXCISIONAL BIOPSY Right    No scar   CESAREAN SECTION     x3   SHOULDER ARTHROSCOPY W/ ROTATOR CUFF REPAIR Right 2004   SHOULDER ARTHROSCOPY W/ ROTATOR CUFF REPAIR Left 2008   Patient Active Problem List   Diagnosis Date Noted   Easy bruising 03/21/2022   Common migraine with intractable migraine 12/10/2019   Chronic pain 08/13/2017   Occipital neuralgia of right side 10/21/2016   Chronic migraine 01/26/2015   Cervicogenic headache 12/16/2014   Subclinical hypothyroidism 12/16/2014   Memory loss 10/08/2014   Depression  10/08/2014   Generalized anxiety disorder 10/08/2014   Tremor 10/08/2014   Major depressive disorder, single episode, moderate (HCC) 08/27/2014   Memory difficulties 08/27/2014   Degenerative disc disease, cervical 08/27/2014   Chronically on opiate therapy 08/27/2014    PCP: Madelin Headings  REFERRING PROVIDER: Duwayne Heck, MD, [facility]  REFERRING DIAG: s/p arthroscopy , partial lateral meniscectomy, chrondroplasty L medial femoral condyle, medial meniscus repair.   THERAPY DIAG:  Difficulty in walking, not elsewhere classified  Decreased ROM of left knee  Chronic pain of left knee  Rationale for Evaluation and Treatment: Rehabilitation  ONSET DATE: 11/21/22  SUBJECTIVE:   SUBJECTIVE STATEMENT: 03/02/23:  Hurting not doing well today. Pt reports that she has some L ear pain think it may be dur to her grinding her teeth.    EVAL:The patient has many concerns:  having to put more weight on L Leg than she is supposed to, poor fit of L DonJoy brace, unable to reach her L foot to don/doff sock, unable to manage the brace by herself, lots of pain and bruising L ankle lat and med knee, some from the brace  PERTINENT HISTORY: Patient with chronic L knee pain, underwent surgery as described above, referred for outpt PT PAIN:  Are you having pain? Yes: NPRS scale: 7/10 Pain  location: L knee, lateral  Pain description: constant Aggravating factors: Using it Relieving factors: elevating leg  PRECAUTIONS: Other: 50% weight bearing, until 2 weeks post op, Dec 05, 2022, no weight bearing flexion L knee until 6 weeks 01/02/23, ROM out of the brace 2 weeks post op, Dec 05, 2022  RED FLAGS: None   WEIGHT BEARING RESTRICTIONS: Yes 50% for initial 6 weeks, to 01/02/23  FALLS:  Has patient fallen in last 6 months? Yes. Number of falls unclear  LIVING ENVIRONMENT: Lives with: lives alone Lives in: House/apartment Stairs: No Has following equipment at home: Single point cane and  Environmental consultant - 2 wheeled  OCCUPATION: retired   PLOF: Independent with basic ADLs  PATIENT GOALS: get to full independence , able to maintain I at  my town home  NEXT MD VISIT: Dec 07 2022  OBJECTIVE:  Note: Objective measures were completed at Evaluation unless otherwise noted.  DIAGNOSTIC FINDINGS: na, see op note in media  PATIENT SURVEYS:  Tegner Lysholm Knee Score: 19 / 100 = 19.0 %   COGNITION: Overall cognitive status: Within functional limits for tasks assessed     SENSATION: WFL  EDEMA:  Mild edema, with multiple healing hematomas entire L LE  POSTURE: rounded shoulders, forward head, and Don joy brace  L LE, brace 2" too low, distally positioned.  Has ace wrap under brace with multiple areas creating indentation skin L leg  PALPATION: Tender along entire L tibia, with patellar mobs, medial and lateral thigh. No warmth or redness noted skin  LOWER EXTREMITY AVW:[id number] to assess L knee ROM due to surgical restrictions 12/26/22: L knee flexion 127, ext 0 01/16/23: L knee flexion 135, ext 0  LOWER EXTREMITY MMT: able to contract L quads, able to perform SLR within the brace. MMT LE L quads, hamstrings wnl, slight atrophy noted L gastrocs   LOWER EXTREMITY SPECIAL TESTS:  na  FUNCTIONAL TESTS:  Patient able to get on and off treatment table I and able to move sit to stand with r walker with SBA .  Very slow, guarded with all movements  GAIT: Distance walked: 71' in clinic Assistive device utilized: Walker - 2 wheeled Level of assistance: Modified independence Comments: patient is supposed to be 50% wbing, unclear whether she is really adhering to this restriction.  Walks with step to pattern with walker, leading with L LE, L knee extended in immobilizer   TODAY'S TREATMENT:                                                                                                                              DATE: 03/02/23 L knee PROM w/ end range holds   Unable to relax for  patellar mobs S2S 2x10 6in step ups x10 each NuStep L 5 x 5 min LE only HS curls 35lb 2x10 Leg Ext 5lb 2x10 Heel raises 2x10 black bar  Resisted side steps 20lb x 3 each  02/20/23:  Nustep level  5, for 4 min 30 sec Supine for stretching L knee into flexion, therapist providing manual distraction of proximal tibia, and IR L tibia in terminal flexion Instructed and had pt perform seated isometric knee ext, in a chair, pushing against the wall 10 sec holds, 6 reps Leg press, 20#, B x 25 reps Ice massage L lat patella over hypersensitive area Toe raises, rocks B forefeet on black bar, 20x Standing with 1 1/2# cuff wts for alt hip ext, cues to maintain knee extended  02/15/23:  Therapeutic activities, manual techniques, therex to improve L knee flexibility and B LE stability Rom L knee flex:142 L knee ext: 0 Palpation still very sensitive L lateral inf patellar border Nustep level 5, x 6 min Supine on mat for manual stretching into L knee flexion, with distraction prox tibia and IR assist at terminal flexion, 5 sec holds at end range, pain free, multiple reps  Seated for isometric B knee extension at 75 degrees flex and wt stack maxed out, cued for 20% to 50% contraction, 10 sec holds x 8 reps. B knee ext 10# 2 x 15 B knee flexion 35# 10x, 3 sets B leg press 20# 10x , 3 sets  Standing with forefeet on bar for B plantarflexor stretch and heel raises RDL's staggered stance L foot slightly forward, 7 reps  02/08/23:  Palpation L knee, very tender along lateral /inf patellar border, kinesiotape partially off L thigh so removed Patient very painful/ limited with tailor position on L, points to lateral patellar border with this motion at about 65 degrees flexion Manual:  therapist provided L tibial IR stretch as pt flexed L knee in supine, repeated 10 bouts.  Improved motion with less pain lat patella with this stretch Nustep L5, 6 min, Ue's and LE's Kinesiotaping again L ant knee to assist  with patellar tracking, 2 I pieces from tibial tubercle to ant thigh, surrounding patella Knee ext B 10# 3 x 10 Knee flexion B 15# 3 x 10   02/06/23: Kinesiotaping L ant knee, 2-I pieces, extending from tibial tubercle to prox ant thigh Sit to stand from elevated mat , heels on black bar, 2 sets 10 Knee flexion B 25# 3 x 10 Knee ext B 5# 3 x 10 Resisted gait, x 5 reps each for/ back , side/side B heel raises with 2 # ankle wts 20 x Standing hip ext with ER 30 degrees, 10 x each 2# cuff wts ankles  Forward step ups on 4" step with B hand rails, opposite knee driver 10 reps  0/45/40: In ll bars for heel/toe rocks,with 2 # cuff wts ankles B knee flexion 25# B knee ext 10# Sit to stand with heels on bar from hi/low table, 15x Resisted gait 20# 5 reps f and 5 reps back  Ice L knee with L LE elevated for 10 min at end of session.  01/25/23: Initially in ll bars for balance/stability activities including rocker board side to side,  progressed to 15 reps without UE support, tends to lock knees into extension Heel/toe rocks on airex 15 x Resisted gait 5 reps each direction with 10# Nustep level 5 Ue's and LE's, 6 min cues to avoid valgum L knee  Sit to stand from elevated table with red tband around thighs for tactile cue to engage B hip abductors and avoid valgum movement B knees.  Mini lunges L foot on compliant side of bosu, with B UE support, again trying to avoid valgum L knee  01/16/23: Reassessed pt for 10th visit goals, including 30 sec sit to stand, ROM, MMT  The patient was instructed in progression to exercises to emphasize stability B LE's as follows: Gait: advised to shorten B stride length as patient is walking with a large step length and is impacting the ground on heel strike L with L knee hyperextended, by reducing stride length she is able to absorb shock with heel strike better.  Resisted gait, 10# 5 reps F, B and side, with CGA throughout.   Heel/toe rocks on airex to  engage righting reactions and B ankle musculature recruitment  01/08/22: Nustep level 4 LE's only x 5 min for mid range rocking, tissue perfusion 4# long arc quads  Seated hamstring curls green t band Standing L hip abd with 2# wt , 10 reps, 2 sets  Supine bridging over ball, 20 reps   Vasopneumatic x 10 min 34 degrees, with L Le elevated on wedge  12/26/22: Supine for NMES [place] current, x 7 min while performing L quad sets with L knee over 1/2 foam roller Long arc quads L 3#  Seated knee flexion 25#, B 15 x 3  12/21/22:  Gait with st cane, utilizing some circumduction L LE, Nustep level 3, managed for 3 min then stopped due to pain Supine for NMES 4 pads, [place], L distal quads, in combination with quad sets over small 1/2 roller Supine for vasopneumatic x 10 min with L LE elevated on wedge  12/19/22: Nustep level 5 6 min LE only Gait training with st cane in R hand, with good pattern noted, mild decreased L hip ext in mid and terminal stance Bknee ext , 5 # B up, L LE eccentric lowering 15 reps Seated B knee flexion 20# 15 reps Standing with 2# cuff weights each ankle for alt hip abd, alt hip flex, alt hip ext Standing heel/ toe rocks on airex in ll bars Standing tandem with leading foot on airex with horizontal head turns for proprioceptive training  12/12/22: Nustep L5 LE's only Supine SLR foot up Supine SLR L LE ER 30 degrees Seated LAQ's 3#  Seated hamstring curls green, stopped after 2 attempts, 5 reps each, due to pain L knee medial jt line Standing for L LE 3 way hip 2# ankle wt L LE, 10 reps each Heel/ toe rocks on airex in ll bars to maintain PWB L Heel raises with ball squeeze ankles to engage medial stabilizers Heel /toe raises with isometric resistance from red t band around ankles 20 reps, hands on bars to maintain PWB  Stair training, practiced up /down 3 steps in clinic x 2, instructed in leading down with L, up with R to avoid weight bearing in flexion on  L knee. 12/08/22 LAQ 2# 2x10  HS curls green 2x10  Fitter pushes 2 blue band 2x10 STS x10 SAQ 2x10 SLR 2x10 NuStep L4 x47mins    12/05/22:  Supine for heel slides, L LE, to tolerance 10 reps, measured 73 degrees Added flat sheet around L  foot, for assisted heel slides, pt to replicate at home Supine quad sets L with towel roll under knee for tactile cuing SLR L , no brace, able to manage without lag, over 10 reps Seated L knee flexion, pt using flat sheet to support L foot Standing with brace on for heel/toe rocks Standing with brace within walker for L hip flex and abd All ex x 10 reps.  Applied ice R ant knee after therex to  alleviate edema and soreness.     PATIENT EDUCATION:  Education details: POC, goals Person educated: Patient Education method: Explanation, Demonstration, and Tactile cues Education comprehension: verbalized understanding, returned demonstration, and verbal cues required  HOME EXERCISE PROGRAM: Inst in correct position of brace . And in quad sets   ASSESSMENT:  CLINICAL IMPRESSION: Patient is a 68 y.o. female who participated in skilled physical therapy session due to L meniscus tear and medial femoral condyle debridement.  She continues to reports tissue irritability L knee , primarily laterally, in spite of an injection.  Continued to progress with strengthening L quads and hamstrings. Today pt was willing to partake in a more active treatment session that's consisted functional interventions. Cuing for speed needed throughout to get pt to slow down when completing activities. Increase fatigued noted with sit to stands  Cue to avoid narrow base of support with resisted side steps. Will plan to continue PT for remainder of her care plan which is through Mar 5.   OBJECTIVE IMPAIRMENTS: decreased activity tolerance, decreased balance, decreased endurance, decreased knowledge of condition, decreased mobility, difficulty walking, decreased ROM, decreased  strength, increased edema, impaired perceived functional ability, impaired flexibility, and pain.   ACTIVITY LIMITATIONS: carrying, lifting, bending, standing, squatting, and stairs  PARTICIPATION LIMITATIONS: meal prep, cleaning, laundry, driving, shopping, community activity, and yard work  PERSONAL FACTORS: Behavior pattern, Education, Fitness, Past/current experiences, and 3+ comorbidities: major depressive disorder, chronic hepititis, multiple hip and shoulder injuries and arthroscopic repairs  are also affecting patient's functional outcome.   REHAB POTENTIAL: Fair    CLINICAL DECISION MAKING: Evolving/moderate complexity  EVALUATION COMPLEXITY: Moderate   GOALS: Goals reviewed with patient? Yes  SHORT TERM GOALS: Target date: 2 weeks 12/05/22 I HEP Baseline: Goal status: met   LONG TERM GOALS: Target date: 03/08/23, extended today for additional 6 weeks, 1-2 x week  Tegner Lysholm Knee Score: 19 / 100 = 19.0 % improve to 80/100 01/16/23: Tegner Lysholm Knee Score: 59 / 100 = 59.0 %   Goal status: progressing  2.  Improve L knee flexibility to 0 to 120 Baseline: flex NT, ext 0 Goal status: 01/16/23: 0 to 136, met   3.  L LE strength 5/5 for normal transfers,functional gait Baseline:  Goal status: 01/16/23: L knee strength quads, hamstrings 5/5, ankle and hip 4+/5   4.  30 sec sit to stand 14 reps or greater for normal transfers, gait Baseline:  Goal status: 01/16/23; progressing, 10 reps without hand support     PLAN:  PT FREQUENCY: 1-2x/week  PT DURATION: 6 weeks  PLANNED INTERVENTIONS: 97110-Therapeutic exercises, 97530- Therapeutic activity, 97112- Neuromuscular re-education, 97535- Self Care, 95621- Manual therapy, and 97014- Electrical stimulation (unattended)  PLAN FOR NEXT SESSION: continue to  advance more strengthening , stability training , address mechanics with functional mobility  Grayce Sessions, PTA 03/02/2023, 3:17 PM

## 2023-03-06 ENCOUNTER — Ambulatory Visit: Payer: Medicare Other | Attending: Orthopedic Surgery

## 2023-03-06 ENCOUNTER — Other Ambulatory Visit: Payer: Self-pay

## 2023-03-06 DIAGNOSIS — M25562 Pain in left knee: Secondary | ICD-10-CM | POA: Insufficient documentation

## 2023-03-06 DIAGNOSIS — R262 Difficulty in walking, not elsewhere classified: Secondary | ICD-10-CM | POA: Insufficient documentation

## 2023-03-06 DIAGNOSIS — G8929 Other chronic pain: Secondary | ICD-10-CM | POA: Insufficient documentation

## 2023-03-06 DIAGNOSIS — M25662 Stiffness of left knee, not elsewhere classified: Secondary | ICD-10-CM | POA: Insufficient documentation

## 2023-03-06 NOTE — Therapy (Signed)
 OUTPATIENT PHYSICAL THERAPY LOWER EXTREMITY TREATMENT       Patient Name: Toni [name] MRN: [id number] DOB:1955/05/20, 68 y.o., female Today's Date: 03/06/2023  END OF SESSION:  PT End of Session - 03/06/23 1514     Visit Number 18    Date for PT Re-Evaluation 04/03/23    Progress Note Due on Visit 20    PT Start Time 1430    PT Stop Time 1515    PT Time Calculation (min) 45 min    Activity Tolerance Patient limited by pain    Behavior During Therapy [facility] for tasks assessed/performed;Anxious              Past Medical History:  Diagnosis Date   Brachial neuritis or radiculitis NOS    Chronic hepatitis C (HCC) 10/05/2015   treated and rsolved   Depressive disorder, not elsewhere classified    Essential and other specified forms of tremor    Fracture of 5th metatarsal 08/13/2017   Fracture of multiple pubic rami (HCC) 08/13/2017   Headache(784.0)    Hepatitis C infection 12/05/2017   Treated and now resolved and "cured " as of early 2018  And      Memory loss    better see evaluation   Microscopic hematuria    seed dr Patsi Sears in past   Migraine, unspecified, without mention of intractable migraine without mention of status migrainosus    Multiple rib fractures 08/13/2017   Other specified disorders of rotator cuff syndrome of shoulder and allied disorders    Vulvar pain    rx with valium supp   Past Surgical History:  Procedure Laterality Date   BREAST EXCISIONAL BIOPSY Right    No scar   CESAREAN SECTION     x3   SHOULDER ARTHROSCOPY W/ ROTATOR CUFF REPAIR Right 2004   SHOULDER ARTHROSCOPY W/ ROTATOR CUFF REPAIR Left 2008   Patient Active Problem List   Diagnosis Date Noted   Easy bruising 03/21/2022   Common migraine with intractable migraine 12/10/2019   Chronic pain 08/13/2017   Occipital neuralgia of right side 10/21/2016   Chronic migraine 01/26/2015   Cervicogenic headache 12/16/2014   Subclinical hypothyroidism 12/16/2014    Memory loss 10/08/2014   Depression 10/08/2014   Generalized anxiety disorder 10/08/2014   Tremor 10/08/2014   Major depressive disorder, single episode, moderate (HCC) 08/27/2014   Memory difficulties 08/27/2014   Degenerative disc disease, cervical 08/27/2014   Chronically on opiate therapy 08/27/2014    PCP: Madelin Headings  REFERRING PROVIDER: Duwayne Heck, MD, [facility]  REFERRING DIAG: s/p arthroscopy , partial lateral meniscectomy, chrondroplasty L medial femoral condyle, medial meniscus repair.   THERAPY DIAG:  Difficulty in walking, not elsewhere classified  Decreased ROM of left knee  Chronic pain of left knee  Rationale for Evaluation and Treatment: Rehabilitation  ONSET DATE: 11/21/22  SUBJECTIVE:   SUBJECTIVE STATEMENT: 03/06/23:  went to doctor after visit last time and dxd with otitis media L ear. Got steroids, feel better now.  Still sharp pain L border of knee cap, worse when upright.   EVAL:The patient has many concerns:  having to put more weight on L Leg than she is supposed to, poor fit of L DonJoy brace, unable to reach her L foot to don/doff sock, unable to manage the brace by herself, lots of pain and bruising L ankle lat and med knee, some from the brace  PERTINENT HISTORY: Patient with chronic L knee pain, underwent surgery as described  above, referred for outpt PT PAIN:  Are you having pain? Yes: NPRS scale: 7/10 Pain location: L knee, lateral  Pain description: constant Aggravating factors: Using it Relieving factors: elevating leg  PRECAUTIONS: Other: 50% weight bearing, until 2 weeks post op, Dec 05, 2022, no weight bearing flexion L knee until 6 weeks 01/02/23, ROM out of the brace 2 weeks post op, Dec 05, 2022  RED FLAGS: None   WEIGHT BEARING RESTRICTIONS: Yes 50% for initial 6 weeks, to 01/02/23  FALLS:  Has patient fallen in last 6 months? Yes. Number of falls unclear  LIVING ENVIRONMENT: Lives with: lives alone Lives in:  House/apartment Stairs: No Has following equipment at home: Single point cane and Environmental consultant - 2 wheeled  OCCUPATION: retired   PLOF: Independent with basic ADLs  PATIENT GOALS: get to full independence , able to maintain I at  my town home  NEXT MD VISIT: Dec 07 2022  OBJECTIVE:  Note: Objective measures were completed at Evaluation unless otherwise noted.  DIAGNOSTIC FINDINGS: na, see op note in media  PATIENT SURVEYS:  Tegner Lysholm Knee Score: 19 / 100 = 19.0 %   COGNITION: Overall cognitive status: Within functional limits for tasks assessed     SENSATION: WFL  EDEMA:  Mild edema, with multiple healing hematomas entire L LE  POSTURE: rounded shoulders, forward head, and Don joy brace  L LE, brace 2" too low, distally positioned.  Has ace wrap under brace with multiple areas creating indentation skin L leg  PALPATION: Tender along entire L tibia, with patellar mobs, medial and lateral thigh. No warmth or redness noted skin  LOWER EXTREMITY ZOX:WRUEAV to assess L knee ROM due to surgical restrictions 12/26/22: L knee flexion 127, ext 0 01/16/23: L knee flexion 135, ext 0  LOWER EXTREMITY MMT: able to contract L quads, able to perform SLR within the brace. MMT LE L quads, hamstrings wnl, slight atrophy noted L gastrocs   LOWER EXTREMITY SPECIAL TESTS:  na  FUNCTIONAL TESTS:  Patient able to get on and off treatment table I and able to move sit to stand with r walker with SBA .  Very slow, guarded with all movements  GAIT: Distance walked: 38' in clinic Assistive device utilized: Walker - 2 wheeled Level of assistance: Modified independence Comments: patient is supposed to be 50% wbing, unclear whether she is really adhering to this restriction.  Walks with step to pattern with walker, leading with L LE, L knee extended in immobilizer   TODAY'S TREATMENT:                                                                                                                               DATE: 03/06/23:  B knee ext 10# 3 x 10 B knee flexion 25# 3 X 10 Resisted gait 20# side stepping 5 x each Resisted gait 30# for and back 5 x each Nustep level 6 LE's only  Manual stretching  L knee flexion with tibial IR overpressure , good response by pt Measured 30 sec sit to stand today.  Ice after session L knee 10 min  03/02/23 L knee PROM w/ end range holds   Unable to relax for patellar mobs S2S 2x10 6in step ups x10 each NuStep L 5 x 5 min LE only HS curls 35lb 2x10 Leg Ext 5lb 2x10 Heel raises 2x10 black bar  Resisted side steps 20lb x 3 each  02/20/23:  Nustep level 5, for 4 min 30 sec Supine for stretching L knee into flexion, therapist providing manual distraction of proximal tibia, and IR L tibia in terminal flexion Instructed and had pt perform seated isometric knee ext, in a chair, pushing against the wall 10 sec holds, 6 reps Leg press, 20#, B x 25 reps Ice massage L lat patella over hypersensitive area Toe raises, rocks B forefeet on black bar, 20x Standing with 1 1/2# cuff wts for alt hip ext, cues to maintain knee extended  02/15/23:  Therapeutic activities, manual techniques, therex to improve L knee flexibility and B LE stability Rom L knee flex:142 L knee ext: 0 Palpation still very sensitive L lateral inf patellar border Nustep level 5, x 6 min Supine on mat for manual stretching into L knee flexion, with distraction prox tibia and IR assist at terminal flexion, 5 sec holds at end range, pain free, multiple reps  Seated for isometric B knee extension at 75 degrees flex and wt stack maxed out, cued for 20% to 50% contraction, 10 sec holds x 8 reps. B knee ext 10# 2 x 15 B knee flexion 35# 10x, 3 sets B leg press 20# 10x , 3 sets  Standing with forefeet on bar for B plantarflexor stretch and heel raises RDL's staggered stance L foot slightly forward, 7 reps  02/08/23:  Palpation L knee, very tender along lateral /inf patellar border,  kinesiotape partially off L thigh so removed Patient very painful/ limited with tailor position on L, points to lateral patellar border with this motion at about 65 degrees flexion Manual:  therapist provided L tibial IR stretch as pt flexed L knee in supine, repeated 10 bouts.  Improved motion with less pain lat patella with this stretch Nustep L5, 6 min, Ue's and LE's Kinesiotaping again L ant knee to assist with patellar tracking, 2 I pieces from tibial tubercle to ant thigh, surrounding patella Knee ext B 10# 3 x 10 Knee flexion B 15# 3 x 10   02/06/23: Kinesiotaping L ant knee, 2-I pieces, extending from tibial tubercle to prox ant thigh Sit to stand from elevated mat , heels on black bar, 2 sets 10 Knee flexion B 25# 3 x 10 Knee ext B 5# 3 x 10 Resisted gait, x 5 reps each for/ back , side/side B heel raises with 2 # ankle wts 20 x Standing hip ext with ER 30 degrees, 10 x each 2# cuff wts ankles  Forward step ups on 4" step with B hand rails, opposite knee driver 10 reps  1/61/09: In ll bars for heel/toe rocks,with 2 # cuff wts ankles B knee flexion 25# B knee ext 10# Sit to stand with heels on bar from hi/low table, 15x Resisted gait 20# 5 reps f and 5 reps back  Ice L knee with L LE elevated for 10 min at end of session.  01/25/23: Initially in ll bars for balance/stability activities including rocker board side to side,  progressed to 15 reps  without UE support, tends to lock knees into extension Heel/toe rocks on airex 15 x Resisted gait 5 reps each direction with 10# Nustep level 5 Ue's and LE's, 6 min cues to avoid valgum L knee  Sit to stand from elevated table with red tband around thighs for tactile cue to engage B hip abductors and avoid valgum movement B knees.  Mini lunges L foot on compliant side of bosu, with B UE support, again trying to avoid valgum L knee  01/16/23: Reassessed pt for 10th visit goals, including 30 sec sit to stand, ROM, MMT  The patient was  instructed in progression to exercises to emphasize stability B LE's as follows: Gait: advised to shorten B stride length as patient is walking with a large step length and is impacting the ground on heel strike L with L knee hyperextended, by reducing stride length she is able to absorb shock with heel strike better.  Resisted gait, 10# 5 reps F, B and side, with CGA throughout.   Heel/toe rocks on airex to engage righting reactions and B ankle musculature recruitment  01/08/22: Nustep level 4 LE's only x 5 min for mid range rocking, tissue perfusion 4# long arc quads  Seated hamstring curls green t band Standing L hip abd with 2# wt , 10 reps, 2 sets  Supine bridging over ball, 20 reps   Vasopneumatic x 10 min 34 degrees, with L Le elevated on wedge  12/26/22: Supine for NMES [place] current, x 7 min while performing L quad sets with L knee over 1/2 foam roller Long arc quads L 3#  Seated knee flexion 25#, B 15 x 3  12/21/22:  Gait with st cane, utilizing some circumduction L LE, Nustep level 3, managed for 3 min then stopped due to pain Supine for NMES 4 pads, [place], L distal quads, in combination with quad sets over small 1/2 roller Supine for vasopneumatic x 10 min with L LE elevated on wedge  12/19/22: Nustep level 5 6 min LE only Gait training with st cane in R hand, with good pattern noted, mild decreased L hip ext in mid and terminal stance Bknee ext , 5 # B up, L LE eccentric lowering 15 reps Seated B knee flexion 20# 15 reps Standing with 2# cuff weights each ankle for alt hip abd, alt hip flex, alt hip ext Standing heel/ toe rocks on airex in ll bars Standing tandem with leading foot on airex with horizontal head turns for proprioceptive training  12/12/22: Nustep L5 LE's only Supine SLR foot up Supine SLR L LE ER 30 degrees Seated LAQ's 3#  Seated hamstring curls green, stopped after 2 attempts, 5 reps each, due to pain L knee medial jt line Standing for L LE 3 way  hip 2# ankle wt L LE, 10 reps each Heel/ toe rocks on airex in ll bars to maintain PWB L Heel raises with ball squeeze ankles to engage medial stabilizers Heel /toe raises with isometric resistance from red t band around ankles 20 reps, hands on bars to maintain PWB  Stair training, practiced up /down 3 steps in clinic x 2, instructed in leading down with L, up with R to avoid weight bearing in flexion on L knee. 12/08/22 LAQ 2# 2x10  HS curls green 2x10  Fitter pushes 2 blue band 2x10 STS x10 SAQ 2x10 SLR 2x10 NuStep L4 x25mins    12/05/22:  Supine for heel slides, L LE, to tolerance 10 reps, measured 73 degrees  Added flat sheet around L  foot, for assisted heel slides, pt to replicate at home Supine quad sets L with towel roll under knee for tactile cuing SLR L , no brace, able to manage without lag, over 10 reps Seated L knee flexion, pt using flat sheet to support L foot Standing with brace on for heel/toe rocks Standing with brace within walker for L hip flex and abd All ex x 10 reps.  Applied ice R ant knee after therex to alleviate edema and soreness.     PATIENT EDUCATION:  Education details: POC, goals Person educated: Patient Education method: Explanation, Demonstration, and Tactile cues Education comprehension: verbalized understanding, returned demonstration, and verbal cues required  HOME EXERCISE PROGRAM: Inst in correct position of brace . And in quad sets   ASSESSMENT:  CLINICAL IMPRESSION: Patient is a 68 y.o. female who participated in skilled physical therapy session due to L meniscus tear and medial femoral condyle debridement.  She continues to reports tissue irritability L knee , primarily laterally, in spite of an injection.  Continued to progress with strengthening L quads and hamstrings. Advanced resistance/ difficulty with all resistance training, continuing to report pain with specific movements and with general weigh bearing activities.  She wishes  to continue with PT until her next ortho appt which is in around 3 weeks so will reassess next visit for extending her POC .   OBJECTIVE IMPAIRMENTS: decreased activity tolerance, decreased balance, decreased endurance, decreased knowledge of condition, decreased mobility, difficulty walking, decreased ROM, decreased strength, increased edema, impaired perceived functional ability, impaired flexibility, and pain.   ACTIVITY LIMITATIONS: carrying, lifting, bending, standing, squatting, and stairs  PARTICIPATION LIMITATIONS: meal prep, cleaning, laundry, driving, shopping, community activity, and yard work  PERSONAL FACTORS: Behavior pattern, Education, Fitness, Past/current experiences, and 3+ comorbidities: major depressive disorder, chronic hepititis, multiple hip and shoulder injuries and arthroscopic repairs  are also affecting patient's functional outcome.   REHAB POTENTIAL: Fair    CLINICAL DECISION MAKING: Evolving/moderate complexity  EVALUATION COMPLEXITY: Moderate   GOALS: Goals reviewed with patient? Yes  SHORT TERM GOALS: Target date: 2 weeks 12/05/22 I HEP Baseline: Goal status: met   LONG TERM GOALS: Target date: 03/08/23, extended today for additional 6 weeks, 1-2 x week  Tegner Lysholm Knee Score: 19 / 100 = 19.0 % improve to 80/100 01/16/23: Tegner Lysholm Knee Score: 59 / 100 = 59.0 %   Goal status: progressing  2.  Improve L knee flexibility to 0 to 120 Baseline: flex NT, ext 0 Goal status: 01/16/23: 0 to 136, met   3.  L LE strength 5/5 for normal transfers,functional gait Baseline:  Goal status: 01/16/23: L knee strength quads, hamstrings 5/5, ankle and hip 4+/5   4.  30 sec sit to stand 14 reps or greater for normal transfers, gait Baseline:  Goal status: 01/16/23; progressing, 10 reps without hand support  03/06/23: 14 reps     PLAN:  PT FREQUENCY: 1-2x/week  PT DURATION: 6 weeks  PLANNED INTERVENTIONS: 97110-Therapeutic exercises, 97530- Therapeutic  activity, 97112- Neuromuscular re-education, 97535- Self Care, 29562- Manual therapy, and 97014- Electrical stimulation (unattended)  PLAN FOR NEXT SESSION: continue to  advance more strengthening , stability training , address mechanics with functional mobility  Azul Coffie L Malikah Principato, PT, DPT< OCS 03/06/2023, 5:57 PM

## 2023-03-08 ENCOUNTER — Ambulatory Visit: Payer: Medicare Other

## 2023-03-13 ENCOUNTER — Ambulatory Visit: Payer: Medicare Other | Admitting: Physical Therapy

## 2023-03-13 ENCOUNTER — Encounter: Payer: Self-pay | Admitting: Physical Therapy

## 2023-03-13 DIAGNOSIS — M25662 Stiffness of left knee, not elsewhere classified: Secondary | ICD-10-CM

## 2023-03-13 DIAGNOSIS — M25562 Pain in left knee: Secondary | ICD-10-CM | POA: Diagnosis present

## 2023-03-13 DIAGNOSIS — G8929 Other chronic pain: Secondary | ICD-10-CM | POA: Diagnosis present

## 2023-03-13 DIAGNOSIS — R262 Difficulty in walking, not elsewhere classified: Secondary | ICD-10-CM

## 2023-03-13 NOTE — Therapy (Addendum)
 OUTPATIENT PHYSICAL THERAPY LOWER EXTREMITY TREATMENT       Patient Name: Toni [name] MRN: [id number] DOB:August 03, 1955, 68 y.o., female Today's Date: 03/13/2023  END OF SESSION:  PT End of Session - 03/13/23 1527     Visit Number 19    Date for PT Re-Evaluation 04/03/23    PT Start Time 1517    PT Stop Time 1600    PT Time Calculation (min) 43 min    Activity Tolerance Patient limited by pain    Behavior During Therapy [facility] for tasks assessed/performed;Anxious              Past Medical History:  Diagnosis Date   Brachial neuritis or radiculitis NOS    Chronic hepatitis C (HCC) 10/05/2015   treated and rsolved   Depressive disorder, not elsewhere classified    Essential and other specified forms of tremor    Fracture of 5th metatarsal 08/13/2017   Fracture of multiple pubic rami (HCC) 08/13/2017   Headache(784.0)    Hepatitis C infection 12/05/2017   Treated and now resolved and "cured " as of early 2018  And      Memory loss    better see evaluation   Microscopic hematuria    seed dr Patsi Sears in past   Migraine, unspecified, without mention of intractable migraine without mention of status migrainosus    Multiple rib fractures 08/13/2017   Other specified disorders of rotator cuff syndrome of shoulder and allied disorders    Vulvar pain    rx with valium supp   Past Surgical History:  Procedure Laterality Date   BREAST EXCISIONAL BIOPSY Right    No scar   CESAREAN SECTION     x3   SHOULDER ARTHROSCOPY W/ ROTATOR CUFF REPAIR Right 2004   SHOULDER ARTHROSCOPY W/ ROTATOR CUFF REPAIR Left 2008   Patient Active Problem List   Diagnosis Date Noted   Easy bruising 03/21/2022   Common migraine with intractable migraine 12/10/2019   Chronic pain 08/13/2017   Occipital neuralgia of right side 10/21/2016   Chronic migraine 01/26/2015   Cervicogenic headache 12/16/2014   Subclinical hypothyroidism 12/16/2014   Memory loss 10/08/2014   Depression  10/08/2014   Generalized anxiety disorder 10/08/2014   Tremor 10/08/2014   Major depressive disorder, single episode, moderate (HCC) 08/27/2014   Memory difficulties 08/27/2014   Degenerative disc disease, cervical 08/27/2014   Chronically on opiate therapy 08/27/2014    PCP: Madelin Headings  REFERRING PROVIDER: Duwayne Heck, MD, [facility]  REFERRING DIAG: s/p arthroscopy , partial lateral meniscectomy, chrondroplasty L medial femoral condyle, medial meniscus repair.   THERAPY DIAG:  Difficulty in walking, not elsewhere classified  Decreased ROM of left knee  Chronic pain of left knee  Rationale for Evaluation and Treatment: Rehabilitation  ONSET DATE: 11/21/22  SUBJECTIVE:   SUBJECTIVE STATEMENT: 03/13/23:  Been really sick couldn't do much. Knee hurts when she walks bare foot in house. Ended back in the [facility] due to inner and outer ear infection.    EVAL:The patient has many concerns:  having to put more weight on L Leg than she is supposed to, poor fit of L DonJoy brace, unable to reach her L foot to don/doff sock, unable to manage the brace by herself, lots of pain and bruising L ankle lat and med knee, some from the brace  PERTINENT HISTORY: Patient with chronic L knee pain, underwent surgery as described above, referred for outpt PT PAIN:  Are you having pain? Yes:  NPRS scale: 6/10 Pain location: L knee, lateral  Pain description: constant Aggravating factors: Using it Relieving factors: elevating leg  PRECAUTIONS: Other: 50% weight bearing, until 2 weeks post op, Dec 05, 2022, no weight bearing flexion L knee until 6 weeks 01/02/23, ROM out of the brace 2 weeks post op, Dec 05, 2022  RED FLAGS: None   WEIGHT BEARING RESTRICTIONS: Yes 50% for initial 6 weeks, to 01/02/23  FALLS:  Has patient fallen in last 6 months? Yes. Number of falls unclear  LIVING ENVIRONMENT: Lives with: lives alone Lives in: House/apartment Stairs: No Has following equipment at home:  Single point cane and Environmental consultant - 2 wheeled  OCCUPATION: retired   PLOF: Independent with basic ADLs  PATIENT GOALS: get to full independence , able to maintain I at  my town home  NEXT MD VISIT: Dec 07 2022  OBJECTIVE:  Note: Objective measures were completed at Evaluation unless otherwise noted.  DIAGNOSTIC FINDINGS: na, see op note in media  PATIENT SURVEYS:  Tegner Lysholm Knee Score: 19 / 100 = 19.0 %   COGNITION: Overall cognitive status: Within functional limits for tasks assessed     SENSATION: WFL  EDEMA:  Mild edema, with multiple healing hematomas entire L LE  POSTURE: rounded shoulders, forward head, and Don joy brace  L LE, brace 2" too low, distally positioned.  Has ace wrap under brace with multiple areas creating indentation skin L leg  PALPATION: Tender along entire L tibia, with patellar mobs, medial and lateral thigh. No warmth or redness noted skin  LOWER EXTREMITY ZOX:WRUEAV to assess L knee ROM due to surgical restrictions 12/26/22: L knee flexion 127, ext 0 01/16/23: L knee flexion 135, ext 0  LOWER EXTREMITY MMT: able to contract L quads, able to perform SLR within the brace. MMT LE L quads, hamstrings wnl, slight atrophy noted L gastrocs   LOWER EXTREMITY SPECIAL TESTS:  na  FUNCTIONAL TESTS:  Patient able to get on and off treatment table I and able to move sit to stand with r walker with SBA .  Very slow, guarded with all movements  GAIT: Distance walked: 45' in clinic Assistive device utilized: Walker - 2 wheeled Level of assistance: Modified independence Comments: patient is supposed to be 50% wbing, unclear whether she is really adhering to this restriction.  Walks with step to pattern with walker, leading with L LE, L knee extended in immobilizer   TODAY'S TREATMENT:                                                                                                                              DATE: 03/13/23 NuStep L 5 x 6 min CHECKED  GOALS HS curls 25lb 2x12 Leg Ext 5lb 2x12 Forward step ups 4in box on airex x 10 STM to  structures around the knee  03/06/23:  B knee ext 10# 3 x 10 B knee flexion 25# 3 X 10 Resisted gait 20#  side stepping 5 x each Resisted gait 30# for and back 5 x each Nustep level 6 LE's only  Manual stretching L knee flexion with tibial IR overpressure , good response by pt Measured 30 sec sit to stand today.  Ice after session L knee 10 min  03/02/23 L knee PROM w/ end range holds   Unable to relax for patellar mobs S2S 2x10 6in step ups x10 each NuStep L 5 x 5 min LE only HS curls 35lb 2x10 Leg Ext 5lb 2x10 Heel raises 2x10 black bar  Resisted side steps 20lb x 3 each  02/20/23:  Nustep level 5, for 4 min 30 sec Supine for stretching L knee into flexion, therapist providing manual distraction of proximal tibia, and IR L tibia in terminal flexion Instructed and had pt perform seated isometric knee ext, in a chair, pushing against the wall 10 sec holds, 6 reps Leg press, 20#, B x 25 reps Ice massage L lat patella over hypersensitive area Toe raises, rocks B forefeet on black bar, 20x Standing with 1 1/2# cuff wts for alt hip ext, cues to maintain knee extended  02/15/23:  Therapeutic activities, manual techniques, therex to improve L knee flexibility and B LE stability Rom L knee flex:142 L knee ext: 0 Palpation still very sensitive L lateral inf patellar border Nustep level 5, x 6 min Supine on mat for manual stretching into L knee flexion, with distraction prox tibia and IR assist at terminal flexion, 5 sec holds at end range, pain free, multiple reps  Seated for isometric B knee extension at 75 degrees flex and wt stack maxed out, cued for 20% to 50% contraction, 10 sec holds x 8 reps. B knee ext 10# 2 x 15 B knee flexion 35# 10x, 3 sets B leg press 20# 10x , 3 sets  Standing with forefeet on bar for B plantarflexor stretch and heel raises RDL's staggered stance L foot slightly  forward, 7 reps   PATIENT EDUCATION:  Education details: POC, goals Person educated: Patient Education method: Explanation, Demonstration, and Tactile cues Education comprehension: verbalized understanding, returned demonstration, and verbal cues required  HOME EXERCISE PROGRAM: Inst in correct position of brace . And in quad sets   ASSESSMENT:  CLINICAL IMPRESSION: Patient is a 68 y.o. female who participated in skilled physical therapy session due to L meniscus tear and medial femoral condyle debridement.  She continues to reports tissue irritability L knee , primarily laterally, in spite of an injection.  Continued to progress with strengthening L quads and hamstrings. Pt continuing to report pain with specific movements and with general weight bearing activities.  Tegner Lysholm Knee Score is 54% indicating some impairments. Instability present with step ups using airex pad. She wishes to continue with PT until her next ortho appt which is in around 2-3 weeks. She has been sick recently and has not been able to do much.  OBJECTIVE IMPAIRMENTS: decreased activity tolerance, decreased balance, decreased endurance, decreased knowledge of condition, decreased mobility, difficulty walking, decreased ROM, decreased strength, increased edema, impaired perceived functional ability, impaired flexibility, and pain.   ACTIVITY LIMITATIONS: carrying, lifting, bending, standing, squatting, and stairs  PARTICIPATION LIMITATIONS: meal prep, cleaning, laundry, driving, shopping, community activity, and yard work  PERSONAL FACTORS: Behavior pattern, Education, Fitness, Past/current experiences, and 3+ comorbidities: major depressive disorder, chronic hepititis, multiple hip and shoulder injuries and arthroscopic repairs  are also affecting patient's functional outcome.   REHAB POTENTIAL: Fair    CLINICAL DECISION MAKING: Evolving/moderate  complexity  EVALUATION COMPLEXITY: Moderate   GOALS: Goals  reviewed with patient? Yes  SHORT TERM GOALS: Target date: 2 weeks 12/05/22 I HEP Baseline: Goal status: met   LONG TERM GOALS: Target date: 03/08/23, extended today for additional 6 weeks, 1-2 x week  Tegner Lysholm Knee Score: 19 / 100 = 19.0 % improve to 80/100 01/16/23: Tegner Lysholm Knee Score: 59 / 100 = 59.0 % 03/13/23 54/100 = 54%   Goal status: progressing  2.  Improve L knee flexibility to 0 to 120 Baseline: flex NT, ext 0 Goal status: 01/16/23: 0 to 136, met   3.  L LE strength 5/5 for normal transfers,functional gait Baseline:  Goal status: 03/13/23 L knee strength quads, hamstrings 5/5, ankle and hip 4+/5   4.  30 sec sit to stand 14 reps or greater for normal transfers, gait Baseline:  Goal status: 01/16/23; progressing, 10 reps without hand support  03/06/23: 14 reps, 03/13/23 15 reps    PLAN:  PT FREQUENCY: 1-2x/week  PT DURATION: 6 weeks  PLANNED INTERVENTIONS: 97110-Therapeutic exercises, 97530- Therapeutic activity, 97112- Neuromuscular re-education, 97535- Self Care, 84696- Manual therapy, and 97014- Electrical stimulation (unattended)  PLAN FOR NEXT SESSION: continue to  advance more strengthening , stability training , address mechanics with functional mobility  Grayce Sessions, PTA, 03/13/2023, 3:28 PM

## 2023-03-15 ENCOUNTER — Encounter: Payer: Self-pay | Admitting: Family Medicine

## 2023-03-15 ENCOUNTER — Ambulatory Visit: Admitting: Family Medicine

## 2023-03-15 ENCOUNTER — Ambulatory Visit: Payer: Medicare Other

## 2023-03-15 VITALS — BP 126/84 | HR 127 | Temp 97.7°F | Ht 61.8 in | Wt 191.8 lb

## 2023-03-15 DIAGNOSIS — N951 Menopausal and female climacteric states: Secondary | ICD-10-CM

## 2023-03-15 DIAGNOSIS — F411 Generalized anxiety disorder: Secondary | ICD-10-CM | POA: Diagnosis not present

## 2023-03-15 DIAGNOSIS — E038 Other specified hypothyroidism: Secondary | ICD-10-CM | POA: Diagnosis not present

## 2023-03-15 DIAGNOSIS — I10 Essential (primary) hypertension: Secondary | ICD-10-CM

## 2023-03-15 DIAGNOSIS — F439 Reaction to severe stress, unspecified: Secondary | ICD-10-CM | POA: Diagnosis not present

## 2023-03-15 DIAGNOSIS — E2839 Other primary ovarian failure: Secondary | ICD-10-CM

## 2023-03-15 NOTE — Patient Instructions (Addendum)
 Toni [name] is a newer medication for menopause symptoms specifically hot flashes/night sweats.  Because it is so new it may not be covered by your insurance.  You can also try taking your gabapentin consistently to see if you notice improvement in symptoms.  Reach out to Dr. Lucianne Muss to see if you can get an appointment sooner to follow-up on how you are feeling.

## 2023-03-15 NOTE — Progress Notes (Signed)
 Established Patient Office Visit   Subjective  Patient ID: Toni [name], female    DOB: [birth year]  Age: 68 y.o. MRN: [id number]  Chief Complaint  Patient presents with   Medical Management of Chronic Issues    Hormone and metabolism, patient would like to know if there is anything better than estroven complete to help with stress and hot flashes weight and mood swings     Pt is a 68 yo female followed by Dr. Fabian Sharp and seen for ongoing concerns.  HAs appt with pcp in a few wks.  Pt inquires about medication options for hot flashes and menopause symptoms. Endorses increased anxiety, mood swings, etc.  States under increased stress due to personal life/losses.  Taking OTC estroven complete.  Also taking a stress multivitamin with ashwagandha.  Followed by [facility].  Currently on gabapentin, but unsure if helping with hot flashes.  Pt mentions taking stimulant med first thing in am then other meds including synthroid.    Patient Active Problem List   Diagnosis Date Noted   Easy bruising 03/21/2022   Common migraine with intractable migraine 12/10/2019   Chronic pain 08/13/2017   Occipital neuralgia of right side 10/21/2016   Chronic migraine 01/26/2015   Cervicogenic headache 12/16/2014   Subclinical hypothyroidism 12/16/2014   Memory loss 10/08/2014   Depression 10/08/2014   Generalized anxiety disorder 10/08/2014   Tremor 10/08/2014   Major depressive disorder, single episode, moderate (HCC) 08/27/2014   Memory difficulties 08/27/2014   Degenerative disc disease, cervical 08/27/2014   Chronically on opiate therapy 08/27/2014   Past Medical History:  Diagnosis Date   Brachial neuritis or radiculitis NOS    Chronic hepatitis C (HCC) 10/05/2015   treated and rsolved   Depressive disorder, not elsewhere classified    Essential and other specified forms of tremor    Fracture of 5th metatarsal 08/13/2017   Fracture of multiple pubic rami (HCC) 08/13/2017    Headache(784.0)    Hepatitis C infection 12/05/2017   Treated and now resolved and "cured " as of early 2018  And      Memory loss    better see evaluation   Microscopic hematuria    seed dr Patsi Sears in past   Migraine, unspecified, without mention of intractable migraine without mention of status migrainosus    Multiple rib fractures 08/13/2017   Other specified disorders of rotator cuff syndrome of shoulder and allied disorders    Vulvar pain    rx with valium supp   Past Surgical History:  Procedure Laterality Date   BREAST EXCISIONAL BIOPSY Right    No scar   CESAREAN SECTION     x3   SHOULDER ARTHROSCOPY W/ ROTATOR CUFF REPAIR Right 2004   SHOULDER ARTHROSCOPY W/ ROTATOR CUFF REPAIR Left 2008   Social History   Tobacco Use   Smoking status: Former    Current packs/day: 0.00    Types: Cigarettes    Quit date: 05/23/1978    Years since quitting: 44.8   Smokeless tobacco: Never  Vaping Use   Vaping status: Never Used  Substance Use Topics   Alcohol use: No    Alcohol/week: 0.0 standard drinks of alcohol   Drug use: Yes    Types: Hydrocodone   Family History  Problem Relation Age of Onset   Headache Mother    Stroke Father        father passed away 07/14/2023   Diabetes Maternal Grandmother    Other Son  Mother is deceased 2013 father moved to the area 2014.   Allergies  Allergen Reactions   Codeine Nausea And Vomiting and Other (See Comments)    Shuts down GI tract      ROS Negative unless stated above    Objective:     BP 126/84 (BP Location: Left Arm, Patient Position: Sitting, Cuff Size: Large)   Pulse (!) 127   Temp 97.7 F (36.5 C) (Oral)   Ht 5' 1.8" (1.57 m)   Wt 191 lb 12.8 oz (87 kg)   SpO2 94%   BMI 35.31 kg/m  BP Readings from Last 3 Encounters:  03/15/23 126/84  10/04/22 (!) 148/86  09/14/22 120/82   Wt Readings from Last 3 Encounters:  03/15/23 191 lb 12.8 oz (87 kg)  10/04/22 186 lb 6.4 oz (84.6 kg)  09/14/22 190 lb  (86.2 kg)     Physical Exam Constitutional:      Appearance: Normal appearance.  HENT:     Head: Normocephalic and atraumatic.     Nose: Nose normal.     Mouth/Throat:     Mouth: Mucous membranes are moist.  Cardiovascular:     Rate and Rhythm: Tachycardia present.  Pulmonary:     Effort: Pulmonary effort is normal.  Skin:    General: Skin is warm and dry.  Neurological:     Mental Status: She is alert and oriented to person, place, and time.  Psychiatric:        Mood and Affect: Mood is anxious. Affect is labile and tearful.        Behavior: Behavior is cooperative.    No results found for any visits on 03/15/23.    03/15/2023    2:31 PM 04/27/2022    2:12 PM 02/23/2022   12:38 PM  GAD 7 : Generalized Anxiety Score  Nervous, Anxious, on Edge 3 2 3   Control/stop worrying 2 1 3   Worry too much - different things 2 2 3   Trouble relaxing 2 1 3   Restless 0 0 3  Easily annoyed or irritable 1 1 1   Afraid - awful might happen 2 1 1   Total GAD 7 Score 12 8 17   Anxiety Difficulty Very difficult Somewhat difficult Very difficult       03/15/2023    2:30 PM 09/14/2022    5:16 PM 04/27/2022    2:12 PM  Depression screen PHQ 2/9  Decreased Interest 2  1  Down, Depressed, Hopeless 2 2 1   PHQ - 2 Score 4 2 2   Altered sleeping 3 2 1   Tired, decreased energy 2 3 1   Change in appetite 1 1 1   Feeling bad or failure about yourself  1 1 0  Trouble concentrating 1 1 1   Moving slowly or fidgety/restless 0 0 3  Suicidal thoughts 0 0 0  PHQ-9 Score 12 10 9   Difficult doing work/chores Somewhat difficult Very difficult Extremely dIfficult      Assessment & Plan:  Generalized anxiety disorder  Symptoms, such as flushing, sleeplessness, headache, lack of concentration, associated with the menopause  Stress  Subclinical hypothyroidism  Pt seen for concerns regarding menopause vs increased GAD.  PHQ 9 and GAD 7 scores 12 this visit.  Previously 10 and 8 respectively.  Pt advised  to contact [facility] provider, Dr. Lucianne Muss to discuss med adjustment.  Counseling.  Discussed other medication options, r/b/a for vasomotor symptoms including HRT and [place].  Pt to seen if noticed any difference when taking gabapentin.  Advised  to take Synthroid first thing in am at least 30 min prior to any other meds and food then recheck TSH in 4-6 wks.  Pt advised to f/u with pcp in the next few wks.  Has appt, try to move up if needed.  Return if symptoms worsen or fail to improve.   Deeann Saint, MD

## 2023-03-19 NOTE — Progress Notes (Incomplete)
 Established Patient Office Visit   Subjective  Patient ID: Toni [name], female    DOB: 1955-05-25  Age: 68 y.o. MRN: [id number]  Chief Complaint  Patient presents with  . Medical Management of Chronic Issues    Hormone and metabolism, patient would like to know if there is anything better than estroven complete to help with stress and hot flashes weight and mood swings     Pt is a 68 yo female followed by Dr. Fabian Sharp and seen for ongoing concerns.  Pt inquires about medication options for hot flashes and menopause symptoms. Endorses increased anxiety, mood swings, etc.  States under increased stress due to personal life/losses.  Taking OTC estroven complete.  Also taking a stress multivitamin with ashwagandha.  Followed by [facility].  Currently on gabapentin, but unsure if helping with hot flashes.      Patient Active Problem List   Diagnosis Date Noted  . Easy bruising 03/21/2022  . Common migraine with intractable migraine 12/10/2019  . Chronic pain 08/13/2017  . Occipital neuralgia of right side 10/21/2016  . Chronic migraine 01/26/2015  . Cervicogenic headache 12/16/2014  . Subclinical hypothyroidism 12/16/2014  . Memory loss 10/08/2014  . Depression 10/08/2014  . Generalized anxiety disorder 10/08/2014  . Tremor 10/08/2014  . Major depressive disorder, single episode, moderate (HCC) 08/27/2014  . Memory difficulties 08/27/2014  . Degenerative disc disease, cervical 08/27/2014  . Chronically on opiate therapy 08/27/2014   Past Medical History:  Diagnosis Date  . Brachial neuritis or radiculitis NOS   . Chronic hepatitis C (HCC) 10/05/2015   treated and rsolved  . Depressive disorder, not elsewhere classified   . Essential and other specified forms of tremor   . Fracture of 5th metatarsal 08/13/2017  . Fracture of multiple pubic rami (HCC) 08/13/2017  . Headache(784.0)   . Hepatitis C infection 12/05/2017   Treated and now resolved and "cured " as of early Apr 20, 2016   And     . Memory loss    better see evaluation  . Microscopic hematuria    seed dr Patsi Sears in past  . Migraine, unspecified, without mention of intractable migraine without mention of status migrainosus   . Multiple rib fractures 08/13/2017  . Other specified disorders of rotator cuff syndrome of shoulder and allied disorders   . Vulvar pain    rx with valium supp   Past Surgical History:  Procedure Laterality Date  . BREAST EXCISIONAL BIOPSY Right    No scar  . CESAREAN SECTION     x3  . SHOULDER ARTHROSCOPY W/ ROTATOR CUFF REPAIR Right 04/21/2002  . SHOULDER ARTHROSCOPY W/ ROTATOR CUFF REPAIR Left 2006/04/21   Social History   Tobacco Use  . Smoking status: Former    Current packs/day: 0.00    Types: Cigarettes    Quit date: 05/23/1978    Years since quitting: 44.8  . Smokeless tobacco: Never  Vaping Use  . Vaping status: Never Used  Substance Use Topics  . Alcohol use: No    Alcohol/week: 0.0 standard drinks of alcohol  . Drug use: Yes    Types: Hydrocodone   Family History  Problem Relation Age of Onset  . Headache Mother   . Stroke Father        father passed away [id number]  . Diabetes Maternal Grandmother   . Other Son        Mother is deceased Apr 21, 2011 father moved to the area Apr 20, 2012.   Allergies  Allergen Reactions  .  Codeine Nausea And Vomiting and Other (See Comments)    Shuts down GI tract      ROS Negative unless stated above    Objective:     BP 126/84 (BP Location: Left Arm, Patient Position: Sitting, Cuff Size: Large)   Pulse (!) 127   Temp 97.7 F (36.5 C) (Oral)   Ht 5' 1.8" (1.57 m)   Wt 191 lb 12.8 oz (87 kg)   SpO2 94%   BMI 35.31 kg/m  BP Readings from Last 3 Encounters:  03/15/23 126/84  10/04/22 (!) 148/86  09/14/22 120/82   Wt Readings from Last 3 Encounters:  03/15/23 191 lb 12.8 oz (87 kg)  10/04/22 186 lb 6.4 oz (84.6 kg)  09/14/22 190 lb (86.2 kg)     Physical Exam Constitutional:      Appearance: Normal appearance.  HENT:      Head: Normocephalic and atraumatic.     Nose: Nose normal.     Mouth/Throat:     Mouth: Mucous membranes are moist.  Cardiovascular:     Rate and Rhythm: Tachycardia present.  Pulmonary:     Effort: Pulmonary effort is normal.  Skin:    General: Skin is warm and dry.  Neurological:     Mental Status: She is alert and oriented to person, place, and time.  Psychiatric:        Mood and Affect: Mood is anxious. Affect is labile and tearful.        Behavior: Behavior is cooperative.    No results found for any visits on 03/15/23.    03/15/2023    2:31 PM 04/27/2022    2:12 PM 02/23/2022   12:38 PM  GAD 7 : Generalized Anxiety Score  Nervous, Anxious, on Edge 3 2 3   Control/stop worrying 2 1 3   Worry too much - different things 2 2 3   Trouble relaxing 2 1 3   Restless 0 0 3  Easily annoyed or irritable 1 1 1   Afraid - awful might happen 2 1 1   Total GAD 7 Score 12 8 17   Anxiety Difficulty Very difficult Somewhat difficult Very difficult       03/15/2023    2:30 PM 09/14/2022    5:16 PM 04/27/2022    2:12 PM  Depression screen PHQ 2/9  Decreased Interest 2  1  Down, Depressed, Hopeless 2 2 1   PHQ - 2 Score 4 2 2   Altered sleeping 3 2 1   Tired, decreased energy 2 3 1   Change in appetite 1 1 1   Feeling bad or failure about yourself  1 1 0  Trouble concentrating 1 1 1   Moving slowly or fidgety/restless 0 0 3  Suicidal thoughts 0 0 0  PHQ-9 Score 12 10 9   Difficult doing work/chores Somewhat difficult Very difficult Extremely dIfficult      Assessment & Plan:  Generalized anxiety disorder  Symptoms, such as flushing, sleeplessness, headache, lack of concentration, associated with the menopause  Stress  Subclinical hypothyroidism  Pt seen for concerns regarding menopause vs increased GAD.  PHQ 9 and GAD 7 scores 12 this visit.  Previoiusly 10 and 8 respectively.    No follow-ups on file.   Deeann Saint, MD

## 2023-03-20 ENCOUNTER — Ambulatory Visit: Payer: Medicare Other

## 2023-03-22 ENCOUNTER — Ambulatory Visit: Payer: Medicare Other

## 2023-04-12 ENCOUNTER — Ambulatory Visit (INDEPENDENT_AMBULATORY_CARE_PROVIDER_SITE_OTHER): Admitting: Internal Medicine

## 2023-04-12 ENCOUNTER — Encounter: Payer: Self-pay | Admitting: Internal Medicine

## 2023-04-12 VITALS — BP 106/86 | HR 79 | Temp 98.0°F | Ht 61.8 in | Wt 189.6 lb

## 2023-04-12 DIAGNOSIS — Z79899 Other long term (current) drug therapy: Secondary | ICD-10-CM | POA: Diagnosis not present

## 2023-04-12 DIAGNOSIS — Z9189 Other specified personal risk factors, not elsewhere classified: Secondary | ICD-10-CM

## 2023-04-12 DIAGNOSIS — R232 Flushing: Secondary | ICD-10-CM

## 2023-04-12 DIAGNOSIS — E038 Other specified hypothyroidism: Secondary | ICD-10-CM

## 2023-04-12 DIAGNOSIS — F439 Reaction to severe stress, unspecified: Secondary | ICD-10-CM

## 2023-04-12 DIAGNOSIS — E559 Vitamin D deficiency, unspecified: Secondary | ICD-10-CM

## 2023-04-12 DIAGNOSIS — Z8249 Family history of ischemic heart disease and other diseases of the circulatory system: Secondary | ICD-10-CM

## 2023-04-12 NOTE — Progress Notes (Signed)
 Chief Complaint  Patient presents with   Medical Management of Chronic Issues    Pt is here follow up on hormone and statin.  Pt added she will have MRI done on her L knee     HPI: Toni [name] 68 y.o. come in for Chronic disease management   Disc poss adding  statin  ?  Family hx of  heart disease. Related to results of ct calcium score also  Had knee surgery   had increase mobility and felt tdoing better per   dr Aundria Rud   but then after a few months pain and getting worse  ? If knee cap problem  not can't do reg walking and mri is scheduled for fu to assess  she is very dissapointed   and diffiulty with mobility and getting around   Updated Pain meds  hyddrocodone .  Bid to tid  if needed .  Grand son  michael  59 yo coming to visit.  And she is overjoyed with this  has plans  Wonder of hormonal  off balance   Hot and sweaty then cold  some med changes but no dramatic  is trying some  25 yo cat  ailing.  Doesn't want to leave it while sick   Dr [name]  short visits   2 x per year  no new advice    ROS: See pertinent positives and negatives per HPI.  Past Medical History:  Diagnosis Date   Brachial neuritis or radiculitis NOS    Chronic hepatitis C (HCC) 10/05/2015   treated and rsolved   Depressive disorder, not elsewhere classified    Essential and other specified forms of tremor    Fracture of 5th metatarsal 08/13/2017   Fracture of multiple pubic rami (HCC) 08/13/2017   Headache(784.0)    Hepatitis C infection 12/05/2017   Treated and now resolved and "cured " as of early 05/05/2016  And      Memory loss    better see evaluation   Microscopic hematuria    seed dr Patsi Sears in past   Migraine, unspecified, without mention of intractable migraine without mention of status migrainosus    Multiple rib fractures 08/13/2017   Other specified disorders of rotator cuff syndrome of shoulder and allied disorders    Vulvar pain    rx with valium supp    Family History   Problem Relation Age of Onset   Headache Mother    Stroke Father        father passed away june17   Diabetes Maternal Grandmother    Other Son        Mother is deceased 05/06/11 father moved to the area 2012-05-05.    Social History   Socioeconomic History   Marital status: Divorced    Spouse name: Not on file   Number of children: 3   Years of education: Not on file   Highest education level: Bachelor's degree (e.g., BA, AB, BS)  Occupational History   Not on file  Tobacco Use   Smoking status: Former    Current packs/day: 0.00    Types: Cigarettes    Quit date: 05/23/1978    Years since quitting: 44.9   Smokeless tobacco: Never  Vaping Use   Vaping status: Never Used  Substance and Sexual Activity   Alcohol use: No    Alcohol/week: 0.0 standard drinks of alcohol   Drug use: Yes    Types: Hydrocodone   Sexual activity: Not Currently  Other Topics Concern   Not on file  Social History Narrative   Lives with grandson   Left Handed   Drinks 1-2 cups daily   Social Drivers of Health   Financial Resource Strain: Low Risk  (03/12/2023)   Overall Financial Resource Strain (CARDIA)    Difficulty of Paying Living Expenses: Not very hard  Food Insecurity: No Food Insecurity (03/12/2023)   Hunger Vital Sign    Worried About Running Out of Food in the Last Year: Never true    Ran Out of Food in the Last Year: Never true  Transportation Needs: Patient Declined (03/12/2023)   PRAPARE - Transportation    Lack of Transportation (Medical): Patient declined    Lack of Transportation (Non-Medical): Patient declined  Physical Activity: Insufficiently Active (03/12/2023)   Exercise Vital Sign    Days of Exercise per Week: 1 day    Minutes of Exercise per Session: 30 min  Stress: Stress Concern Present (03/12/2023)   [employer] of Occupational Health - Occupational Stress Questionnaire    Feeling of Stress : Very much  Social Connections: Moderately Integrated (03/12/2023)   Social  Connection and Isolation Panel [NHANES]    Frequency of Communication with Friends and Family: Three times a week    Frequency of Social Gatherings with Friends and Family: Twice a week    Attends Religious Services: 1 to 4 times per year    Active Member of [employer] or Organizations: Yes    Attends Banker Meetings: 1 to 4 times per year    Marital Status: Divorced    Outpatient Medications Prior to Visit  Medication Sig Dispense Refill   albuterol (VENTOLIN HFA) 108 (90 Base) MCG/ACT inhaler Inhale 2 puffs into the lungs every 6 (six) hours as needed for wheezing or shortness of breath. 1 each 1   Alum Hydroxide-Mag Trisilicate 80-14.2 MG CHEW Chew by mouth. CHEW 2 TABLETS BY MOUTH BEFORE EACH MEAL AT BEDTIME AS NEEDED FOR INDIGESTION OR HEARTBURN     amLODipine (NORVASC) 2.5 MG tablet Take 1 tablet (2.5 mg total) by mouth daily. 90 tablet 1   cetirizine (ZYRTEC) 10 MG tablet Take 10 mg by mouth daily as needed for allergies.     clonazePAM (KLONOPIN) 0.5 MG tablet Take by mouth.     desvenlafaxine (PRISTIQ) 50 MG 24 hr tablet Take 50 mg by mouth daily.     dextroamphetamine (DEXEDRINE SPANSULE) 15 MG 24 hr capsule Take 30 mg by mouth daily. 2 po q am and 1 po q noon 01/27/22: weaning it down to 1 a day.     fluticasone (FLONASE) 50 MCG/ACT nasal spray Place 1 spray into both nostrils daily. 16 g 5   gabapentin (NEURONTIN) 600 MG tablet Take 600 mg by mouth. PO TID FOR HEAD PAIN     HYDROcodone-acetaminophen (NORCO) 10-325 MG tablet Take 1 tablet by mouth every 8 (eight) hours as needed.     ketoconazole (NIZORAL) 2 % cream APPLY TO AFFECTED AREA TWICE A DAY 30 g 2   levothyroxine (SYNTHROID) 50 MCG tablet TAKE ONE TABLET BY MOUTH DAILY 90 tablet 3   Misc Natural Products (ESTROVEN + ENERGY MAX STRENGTH) TABS Take by mouth daily.     Multiple Vitamins-Minerals (CENTRUM SILVER ADULT 50+) TABS Take 1 tablet by mouth daily.     SUMAtriptan (IMITREX) 25 MG tablet Take 1 tablet (25 mg  total) by mouth as needed for migraine. May repeat in 2 hours if headache persists or recurs.  10 tablet 11   SUMAtriptan 6 MG/0.5ML SOAJ Use 1 syringe for migraines upon awakening.  Do not use with Imitrex tablets. (Patient taking differently: as needed. Use 1 syringe for migraines upon awakening.  Do not use with Imitrex tablets.) 0.5 mL 6   Vitamin D, Cholecalciferol, 25 MCG (1000 UT) CAPS Take 1 capsule by mouth daily.     Cyanocobalamin ([employer] B12 GUMMIES PO) Take 1 capsule by mouth daily. (Patient not taking: Reported on 10/04/2022)     No facility-administered medications prior to visit.     EXAM:  BP 106/86 (BP Location: Left Arm, Patient Position: Sitting, Cuff Size: Large)   Pulse 79   Temp 98 F (36.7 C) (Oral)   Ht 5' 1.8" (1.57 m)   Wt 189 lb 9.6 oz (86 kg)   SpO2 98%   BMI 34.90 kg/m   Body mass index is 34.9 kg/m.  GENERAL: vitals reviewed and listed above, alert, oriented, appears well hydrated stressed but cognition  stable with normal speech HEENT: atraumatic, conjunctiva  clear, no obvious abnormalities on inspection of external nose and ears  NECK: no obvious masses on inspection palpation  LUNGS: clear to auscultation bilaterally, no wheezes, rales or rhonchi, good air movement CV: HRRR, no clubbing cyanosis or  peripheral edema nl cap refill  MS: moves all extremities left knee with surgical scare  no redness or warmth limp with walk  PSYCH: pleasant and cooperative,  disc of stres distress with situation Lab Results  Component Value Date   WBC 6.6 09/29/2022   HGB 13.8 09/29/2022   HCT 42.4 09/29/2022   PLT 230.0 09/29/2022   GLUCOSE 100 (H) 09/29/2022   CHOL 223 (H) 09/29/2022   TRIG 76.0 09/29/2022   HDL 61.40 09/29/2022   LDLCALC 147 (H) 09/29/2022   ALT 13 09/29/2022   AST 17 09/29/2022   NA 141 09/29/2022   K 4.3 09/29/2022   CL 104 09/29/2022   CREATININE 0.82 09/29/2022   BUN 21 09/29/2022   CO2 30 09/29/2022   TSH 1.18 09/29/2022   INR 1.0  03/21/2022   HGBA1C 5.7 09/29/2022   BP Readings from Last 3 Encounters:  04/12/23 106/86  03/15/23 126/84  10/04/22 (!) 148/86    ASSESSMENT AND PLAN:  Discussed the following assessment and plan:  Subclinical hypothyroidism - Plan: TSH, T4, free, Vitamin D, 25-hydroxy, CBC with Differential/Platelet, Basic metabolic panel with GFR, Hepatic function panel, C-reactive protein  Stress - Plan: TSH, T4, free, Vitamin D, 25-hydroxy, CBC with Differential/Platelet, Basic metabolic panel with GFR, Hepatic function panel, C-reactive protein  Medication management - Plan: TSH, T4, free, Vitamin D, 25-hydroxy, CBC with Differential/Platelet, Basic metabolic panel with GFR, Hepatic function panel, C-reactive protein  Cardiovascular risk factor - Plan: TSH, T4, free, Vitamin D, 25-hydroxy, CBC with Differential/Platelet, Basic metabolic panel with GFR, Hepatic function panel, C-reactive protein  Family history of heart disease - Plan: TSH, T4, free, Vitamin D, 25-hydroxy, CBC with Differential/Platelet, Basic metabolic panel with GFR, Hepatic function panel, C-reactive protein  Vitamin D deficiency - Plan: TSH, T4, free, Vitamin D, 25-hydroxy, CBC with Differential/Platelet, Basic metabolic panel with GFR, Hepatic function panel, C-reactive protein  Hot flashes uncertain cause Update labs  indicated  Sx could be from med se also  She is very disappointed that cannot get back to  reg walking at this time Get labs  today on no vit d supp but if all ok will begin a statin med  cause of Benefit more than  risk . Make sure vit d leve l in range  FU depending on results  -Patient advised to return or notify health care team  if  new concerns arise.  Patient Instructions  Advise  we recheck labs to see if off and causing problems  Can take the thyroid med  any time of day but consistent . After labs I can send in statin medication. And then go from there and fu.    Neta Mends. Taiga Lupinacci M.D.

## 2023-04-12 NOTE — Patient Instructions (Addendum)
 Advise  we recheck labs to see if off and causing problems  Can take the thyroid med  any time of day but consistent . After labs I can send in statin medication. And then go from there and fu.

## 2023-04-13 ENCOUNTER — Encounter: Payer: Self-pay | Admitting: Internal Medicine

## 2023-04-13 ENCOUNTER — Other Ambulatory Visit: Payer: Self-pay

## 2023-04-13 DIAGNOSIS — Z9189 Other specified personal risk factors, not elsewhere classified: Secondary | ICD-10-CM

## 2023-04-13 DIAGNOSIS — Z8249 Family history of ischemic heart disease and other diseases of the circulatory system: Secondary | ICD-10-CM

## 2023-04-13 LAB — HEPATIC FUNCTION PANEL
ALT: 13 U/L (ref 0–35)
AST: 19 U/L (ref 0–37)
Albumin: 4.8 g/dL (ref 3.5–5.2)
Alkaline Phosphatase: 82 U/L (ref 39–117)
Bilirubin, Direct: 0.1 mg/dL (ref 0.0–0.3)
Total Bilirubin: 0.3 mg/dL (ref 0.2–1.2)
Total Protein: 7.9 g/dL (ref 6.0–8.3)

## 2023-04-13 LAB — CBC WITH DIFFERENTIAL/PLATELET
Basophils Absolute: 0.1 10*3/uL (ref 0.0–0.1)
Basophils Relative: 1.2 % (ref 0.0–3.0)
Eosinophils Absolute: 0.1 10*3/uL (ref 0.0–0.7)
Eosinophils Relative: 0.7 % (ref 0.0–5.0)
HCT: 45.6 % (ref 36.0–46.0)
Hemoglobin: 15 g/dL (ref 12.0–15.0)
Lymphocytes Relative: 27.3 % (ref 12.0–46.0)
Lymphs Abs: 2.1 10*3/uL (ref 0.7–4.0)
MCHC: 32.9 g/dL (ref 30.0–36.0)
MCV: 89.1 fl (ref 78.0–100.0)
Monocytes Absolute: 0.7 10*3/uL (ref 0.1–1.0)
Monocytes Relative: 8.7 % (ref 3.0–12.0)
Neutro Abs: 4.7 10*3/uL (ref 1.4–7.7)
Neutrophils Relative %: 62.1 % (ref 43.0–77.0)
Platelets: 250 10*3/uL (ref 150.0–400.0)
RBC: 5.12 Mil/uL — ABNORMAL HIGH (ref 3.87–5.11)
RDW: 13.1 % (ref 11.5–15.5)
WBC: 7.6 10*3/uL (ref 4.0–10.5)

## 2023-04-13 LAB — BASIC METABOLIC PANEL WITH GFR
BUN: 26 mg/dL — ABNORMAL HIGH (ref 6–23)
CO2: 27 meq/L (ref 19–32)
Calcium: 9.6 mg/dL (ref 8.4–10.5)
Chloride: 101 meq/L (ref 96–112)
Creatinine, Ser: 1.11 mg/dL (ref 0.40–1.20)
GFR: 50.71 mL/min — ABNORMAL LOW (ref >60.00)
Glucose, Bld: 104 mg/dL — ABNORMAL HIGH (ref 70–99)
Potassium: 4.3 meq/L (ref 3.5–5.1)
Sodium: 139 meq/L (ref 135–145)

## 2023-04-13 LAB — T4, FREE: Free T4: 0.82 ng/dL (ref 0.60–1.60)

## 2023-04-13 LAB — TSH: TSH: 3.85 u[IU]/mL (ref 0.35–5.50)

## 2023-04-13 LAB — VITAMIN D 25 HYDROXY (VIT D DEFICIENCY, FRACTURES): VITD: 29.69 ng/mL — ABNORMAL LOW (ref 30.00–100.00)

## 2023-04-13 LAB — C-REACTIVE PROTEIN: CRP: <1 mg/dL (ref 0.5–20.0)

## 2023-04-13 MED ORDER — ROSUVASTATIN CALCIUM 10 MG PO TABS: 10.0000 mg | ORAL_TABLET | Freq: Every day | ORAL | 0 refills | Status: DC

## 2023-04-13 NOTE — Progress Notes (Signed)
 Thyroid  and other labs in range or ok    vit d is borderline low  so take 1000 international  units  per day  Vit D  Send in crestor  10mg  per day  disp 90 .  Plan  fu lipid check after 3+ months of statin medication or as needed

## 2023-04-29 ENCOUNTER — Other Ambulatory Visit: Payer: Self-pay | Admitting: Internal Medicine

## 2023-06-04 HISTORY — PX: ORIF ELBOW FRACTURE: SUR928

## 2023-06-14 ENCOUNTER — Other Ambulatory Visit: Payer: Medicare Other

## 2023-06-26 ENCOUNTER — Telehealth: Payer: Self-pay

## 2023-06-26 NOTE — Telephone Encounter (Signed)
 Spoke to pt to follow up on her admission for General Surgery.   Pt updates she is following up with Ilean Beers, Dr. Alyse on 06/28/2023. She contacted her insurance to get help with anything she needs. And request for 28 days meals.   Pt updates that her ex-husband passed away in the same week as she had surgery. She added that she is still seeing her life therapist.   Pt added that she started on statin and made her stomach feels off. Stop the medication and now started back on it.   Pt thank for following up with her. Inform pt to let us  know if she needs anything and will forward this to provider to be aware. Pt verbalized understanding.

## 2023-06-26 NOTE — Transitions of Care (Post Inpatient/ED Visit) (Signed)
 06/26/2023  Name: Toni [name] MRN: [id number] DOB: 10-27-1955  Today's TOC FU Call Status: Today's TOC FU Call Status:: Successful TOC FU Call Completed TOC FU Call Complete Date: 06/26/23 Patient's Name and Date of Birth confirmed.  Transition Care Management Follow-up Telephone Call    Items Reviewed:    Medications Reviewed Today: Medications Reviewed Today     Reviewed by Carlyn Mullenbach, CMA (Certified Medical Assistant) on 06/26/23 at 1514  Med List Status: <None>   Medication Order Taking? Sig Documenting Provider Last Dose Status Informant  albuterol  (VENTOLIN  HFA) 108 (90 Base) MCG/ACT inhaler [id number] Yes Inhale 2 puffs into the lungs every 6 (six) hours as needed for wheezing or shortness of breath. Panosh, Wanda K, MD  Active   Alum Hydroxide-Mag Trisilicate 80-14.2 MG CHEW [id number]  Chew by mouth. CHEW 2 TABLETS BY MOUTH BEFORE EACH MEAL AT BEDTIME AS NEEDED FOR INDIGESTION OR HEARTBURN [provider]  Active   amLODipine  (NORVASC ) 2.5 MG tablet [id number] Yes TAKE 1 TABLET BY MOUTH DAILY Panosh, Wanda K, MD  Active   cetirizine (ZYRTEC) 10 MG tablet [id number] Yes Take 10 mg by mouth daily as needed for allergies. [provider]  Active Self  clonazePAM (KLONOPIN) 0.5 MG tablet [id number] Yes Take by mouth. [provider]  Active   desvenlafaxine  (PRISTIQ ) 50 MG 24 hr tablet [id number] Yes Take 50 mg by mouth daily. [provider]  Active   dextroamphetamine  (DEXEDRINE  SPANSULE) 15 MG 24 hr capsule [id number]  Take 30 mg by mouth daily. 2 po q am and 1 po q noon 01/27/22: weaning it down to 1 a day.  Patient not taking: Reported on 06/26/2023   Vincente Grip, MD  Active Self  fluticasone  (FLONASE ) 50 MCG/ACT nasal spray [id number] Yes Place 1 spray into both nostrils daily. Panosh, Wanda K, MD  Active   gabapentin  (NEURONTIN ) 600 MG tablet [id number] Yes Take 600 mg by mouth. PO TID FOR HEAD PAIN [provider]   Active Self  HYDROcodone -acetaminophen  (NORCO) 10-325 MG tablet [id number] Yes Take 1 tablet by mouth every 8 (eight) hours as needed. [provider]  Active   ketoconazole  (NIZORAL ) 2 % cream [id number] Yes APPLY TO AFFECTED AREA TWICE A DAY Panosh, Wanda K, MD  Active   levothyroxine  (SYNTHROID ) 50 MCG tablet [id number] Yes TAKE ONE TABLET BY MOUTH DAILY Webb, Padonda B, FNP  Active   Misc Natural Products (ESTROVEN + ENERGY MAX STRENGTH) TABS [id number] Yes Take by mouth daily. [provider]  Active Self  Multiple Vitamins-Minerals (CENTRUM SILVER ADULT 50+) TABS [id number] Yes Take 1 tablet by mouth daily. [provider]  Active Self  oxyCODONE  (ROXICODONE ) 15 MG immediate release tablet [id number] Yes Take 15 mg by mouth. [provider]  Active   rosuvastatin  (CRESTOR ) 10 MG tablet [id number] Yes Take 1 tablet (10 mg total) by mouth daily. Panosh, Apolinar POUR, MD  Active   SUMAtriptan  (IMITREX ) 25 MG tablet [id number] Yes Take 1 tablet (25 mg total) by mouth as needed for migraine. May repeat in 2 hours if headache persists or recurs. Jenel Carlin POUR, MD  Active   SUMAtriptan  6 MG/0.5ML EMMANUEL [id number]  Use 1 syringe for migraines upon awakening.  Do not use with Imitrex  tablets.  Patient not taking: Reported on 06/26/2023   Jenel Carlin POUR, MD  Active   Vitamin D , Cholecalciferol, 25 MCG (1000 UT) CAPS [id number] Yes Take 1 capsule by mouth daily. [provider]  Active  Home Care and Equipment/Supplies:    Functional Questionnaire:    Follow up appointments reviewed: Specialist Hospital Follow-up appointment confirmed?: Yes Date of Specialist follow-up appointment?: 06/28/23 Follow-Up Specialty Provider:: Dr. Alyse with [facility]      SIGNATURE: Buster Schueller, CMA

## 2023-06-29 ENCOUNTER — Other Ambulatory Visit (HOSPITAL_BASED_OUTPATIENT_CLINIC_OR_DEPARTMENT_OTHER)

## 2023-07-09 ENCOUNTER — Other Ambulatory Visit: Payer: Self-pay | Admitting: Internal Medicine

## 2023-07-11 ENCOUNTER — Other Ambulatory Visit: Payer: Self-pay

## 2023-07-11 MED ORDER — LEVOTHYROXINE SODIUM 50 MCG PO TABS: 50.0000 ug | ORAL_TABLET | Freq: Every day | ORAL | 3 refills | Status: AC

## 2023-07-12 ENCOUNTER — Telehealth: Payer: Self-pay

## 2023-07-12 NOTE — Telephone Encounter (Signed)
 Copied from CRM [id number]. Topic: General - Other >> Jul 12, 2023 10:49 AM Henretta I wrote: Reason for CRM: Patient just wanted to call and notify that she unable to stay on the rosuvastatin  (CRESTOR ) 10 MG tablet due to experiencing some side effects. She stated she had some diarrhea. Patient did not want a call back about this, just wanted to make doctor aware.

## 2023-07-13 ENCOUNTER — Other Ambulatory Visit

## 2023-08-14 ENCOUNTER — Encounter: Payer: Self-pay | Admitting: Internal Medicine

## 2023-08-14 NOTE — Addendum Note (Signed)
 Addended byBETHA CHARLETT APOLINAR MARLA on: 08/14/2023 08:42 AM   Modules accepted: Orders

## 2023-10-04 ENCOUNTER — Ambulatory Visit: Admitting: Internal Medicine

## 2023-10-11 ENCOUNTER — Ambulatory Visit: Admitting: Internal Medicine

## 2023-10-11 ENCOUNTER — Encounter: Payer: Self-pay | Admitting: Internal Medicine

## 2023-10-11 VITALS — BP 96/70 | HR 85 | Temp 97.7°F | Ht 61.8 in | Wt 194.8 lb

## 2023-10-11 DIAGNOSIS — F439 Reaction to severe stress, unspecified: Secondary | ICD-10-CM

## 2023-10-11 DIAGNOSIS — Z79899 Other long term (current) drug therapy: Secondary | ICD-10-CM | POA: Diagnosis not present

## 2023-10-11 DIAGNOSIS — E2839 Other primary ovarian failure: Secondary | ICD-10-CM

## 2023-10-11 DIAGNOSIS — F4312 Post-traumatic stress disorder, chronic: Secondary | ICD-10-CM

## 2023-10-11 DIAGNOSIS — G8929 Other chronic pain: Secondary | ICD-10-CM

## 2023-10-11 DIAGNOSIS — M25562 Pain in left knee: Secondary | ICD-10-CM

## 2023-10-11 DIAGNOSIS — Z8781 Personal history of (healed) traumatic fracture: Secondary | ICD-10-CM

## 2023-10-11 NOTE — Progress Notes (Signed)
 Chief Complaint  Patient presents with   Medical Management of Chronic Issues    Pt reports she has been seeing Dr. Vincente twice a year, got Rx from the provider. Pt would like to discuss referral to new psychiatrist.     HPI: Toni [name] 68 y.o. come in for Chronic disease management  Many thinks have been happening    difficulties .    Elbow fracture discloc 6 15 from fall  R knee a problem   dr Sharl    Had left knee surgery  in past . Year?  Arthro surgery clean out  left  knee keeping her  from walking much  now on Gel injectinos.  Had to change family plans cause of  knee pain limiting mobility . Last in person visit 4/25 Cat had to be put down  and other things going on .  Neighbor committed suicide .  Still working on difficult getting est ate settled with ex  Celeste nettles is therapist   chronic  ptsd Wondering if another  psych for meds  would help.  [facility]. ?  Counselor Celeste Nettles  advised help for chronic PTSD with so many events to health family and others around. Sees Dr meade  for a while pain management   NS office   neck and back (  but  now knee issue ) but never takes more than  up to 3 x per day knee and back . Every 3 months .appt . Dr Vincente  give meds 90 days and appt every 6 mos brief visit .  On pristiq ,  tid low dose clonazepam Dexedrine  sinc [id number] for brain funcitoning ( see  prev eval  and neuropsych evals)  ? Other opinion meds also for chronic ptsd)   ROS: See pertinent positives and negatives per HPI.  Past Medical History:  Diagnosis Date   Brachial neuritis or radiculitis NOS    Chronic hepatitis C (HCC) 10/05/2015   treated and rsolved   Depressive disorder, not elsewhere classified    Essential and other specified forms of tremor    Fracture of 5th metatarsal 08/13/2017   Fracture of multiple pubic rami (HCC) 08/13/2017   Headache(784.0)    Hepatitis C infection 12/05/2017   Treated and now resolved and cured   as of early 2016-11-02  And      Memory loss    better see evaluation   Microscopic hematuria    seed dr chales in past   Migraine, unspecified, without mention of intractable migraine without mention of status migrainosus    Multiple rib fractures 08/13/2017   Other specified disorders of rotator cuff syndrome of shoulder and allied disorders    Vulvar pain    rx with valium  supp    Family History  Problem Relation Age of Onset   Headache Mother    Stroke Father        father passed away june17   Diabetes Maternal Grandmother    Other Son        Mother is deceased 2011-11-03 father moved to the area 11/02/2012.    Social History   Socioeconomic History   Marital status: Divorced    Spouse name: Not on file   Number of children: 3   Years of education: Not on file   Highest education level: Bachelor's degree (e.g., BA, AB, BS)  Occupational History   Not on file  Tobacco Use   Smoking status: Former    Current packs/day:  0.00    Types: Cigarettes    Quit date: 05/23/1978    Years since quitting: 45.4   Smokeless tobacco: Never  Vaping Use   Vaping status: Never Used  Substance and Sexual Activity   Alcohol use: No    Alcohol/week: 0.0 standard drinks of alcohol   Drug use: Yes    Types: Hydrocodone    Sexual activity: Not Currently  Other Topics Concern   Not on file  Social History Narrative   Lives with grandson   Left Handed   Drinks 1-2 cups daily   Social Drivers of Health   Financial Resource Strain: Low Risk  (03/12/2023)   Overall Financial Resource Strain (CARDIA)    Difficulty of Paying Living Expenses: Not very hard  Food Insecurity: No Food Insecurity (06/19/2023)   Received from [facility]   Hunger Vital Sign    Within the past 12 months, you worried that your food would run out before you got the money to buy more.: Never true    Within the past 12 months, the food you bought just didn't last and you didn't have money to get more.: Never true   Transportation Needs: No Transportation Needs (06/19/2023)   Received from [facility] - Transportation    Lack of Transportation (Medical): No    Lack of Transportation (Non-Medical): No  Physical Activity: Insufficiently Active (03/12/2023)   Exercise Vital Sign    Days of Exercise per Week: 1 day    Minutes of Exercise per Session: 30 min  Stress: No Stress Concern Present (06/19/2023)   Received from [facility]: [facility] - Occupational Stress Questionnaire    Feeling of Stress : Not at all  Social Connections: Moderately Integrated (03/12/2023)   Social Connection and Isolation Panel    Frequency of Communication with Friends and Family: Three times a week    Frequency of Social Gatherings with Friends and Family: Twice a week    Attends Religious Services: 1 to 4 times per year    Active Member of [employer] or Organizations: Yes    Attends Banker Meetings: 1 to 4 times per year    Marital Status: Divorced    Outpatient Medications Prior to Visit  Medication Sig Dispense Refill   albuterol  (VENTOLIN  HFA) 108 (90 Base) MCG/ACT inhaler Inhale 2 puffs into the lungs every 6 (six) hours as needed for wheezing or shortness of breath. 1 each 1   amLODipine  (NORVASC ) 2.5 MG tablet TAKE 1 TABLET BY MOUTH DAILY 90 tablet 1   cetirizine (ZYRTEC) 10 MG tablet Take 10 mg by mouth daily as needed for allergies.     clonazePAM (KLONOPIN) 0.5 MG tablet Take by mouth.     desvenlafaxine  (PRISTIQ ) 50 MG 24 hr tablet Take 50 mg by mouth daily.     fluticasone  (FLONASE ) 50 MCG/ACT nasal spray Place 1 spray into both nostrils daily. 16 g 5   gabapentin  (NEURONTIN ) 600 MG tablet Take 600 mg by mouth. PO TID FOR HEAD PAIN     HYDROcodone -acetaminophen  (NORCO) 10-325 MG tablet Take 1 tablet by mouth every 8 (eight) hours as needed.     ketoconazole  (NIZORAL ) 2 % cream APPLY TO AFFECTED AREA TWICE A DAY 30 g 2   levothyroxine  (SYNTHROID ) 50 MCG  tablet Take 1 tablet (50 mcg total) by mouth daily. 90 tablet 3   Misc Natural Products (ESTROVEN + ENERGY MAX STRENGTH) TABS Take by mouth daily.  Multiple Vitamins-Minerals (CENTRUM SILVER ADULT 50+) TABS Take 1 tablet by mouth daily.     SUMAtriptan  (IMITREX ) 25 MG tablet Take 1 tablet (25 mg total) by mouth as needed for migraine. May repeat in 2 hours if headache persists or recurs. 10 tablet 11   Alum Hydroxide-Mag Trisilicate 80-14.2 MG CHEW Chew by mouth. CHEW 2 TABLETS BY MOUTH BEFORE EACH MEAL AT BEDTIME AS NEEDED FOR INDIGESTION OR HEARTBURN     dextroamphetamine  (DEXEDRINE  SPANSULE) 15 MG 24 hr capsule Take 30 mg by mouth daily. 2 po q am and 1 po q noon 01/27/22: weaning it down to 1 a day. (Patient not taking: Reported on 10/11/2023)     SUMAtriptan  6 MG/0.5ML SOAJ Use 1 syringe for migraines upon awakening.  Do not use with Imitrex  tablets. (Patient not taking: Reported on 10/11/2023) 0.5 mL 6   Vitamin D , Cholecalciferol, 25 MCG (1000 UT) CAPS Take 1 capsule by mouth daily. (Patient not taking: Reported on 10/11/2023)     No facility-administered medications prior to visit.     EXAM:  BP 96/70 (BP Location: Left Arm, Patient Position: Sitting, Cuff Size: Large)   Pulse 85   Temp 97.7 F (36.5 C) (Oral)   Ht 5' 1.8 (1.57 m)   Wt 194 lb 12.8 oz (88.4 kg)   SpO2 96%   BMI 35.86 kg/m   Body mass index is 35.86 kg/m. Wt Readings from Last 3 Encounters:  10/11/23 194 lb 12.8 oz (88.4 kg)  04/12/23 189 lb 9.6 oz (86 kg)  03/15/23 191 lb 12.8 oz (87 kg)    GENERAL: vitals reviewed and listed above, alert, oriented, appears well hydrated and in no acute distress stress  cognitin seems intact  stress   HEENT: atraumatic, conjunctiva  clear, no obvious abnormalities on inspection of external nose and ear NECK: no obvious masses  LUNGS: clear to auscultation bilaterally, no wheezes, rales or rhonchi,  CV: HRRR, no clubbing cyanosis  nl cap refill  MS: moves all extremities  left knee  painful no redness or warmth limping  r arm elbow with well healed scar and good rom .  PSYCH: coherant  and cooperative, baseline  stress  Lab Results  Component Value Date   WBC 7.6 04/12/2023   HGB 15.0 04/12/2023   HCT 45.6 04/12/2023   PLT 250.0 04/12/2023   GLUCOSE 104 (H) 04/12/2023   CHOL 223 (H) 09/29/2022   TRIG 76.0 09/29/2022   HDL 61.40 09/29/2022   LDLCALC 147 (H) 09/29/2022   ALT 13 04/12/2023   AST 19 04/12/2023   NA 139 04/12/2023   K 4.3 04/12/2023   CL 101 04/12/2023   CREATININE 1.11 04/12/2023   BUN 26 (H) 04/12/2023   CO2 27 04/12/2023   TSH 3.85 04/12/2023   INR 1.0 03/21/2022   HGBA1C 5.7 09/29/2022   BP Readings from Last 3 Encounters:  10/11/23 96/70  04/12/23 106/86  03/15/23 126/84  Last dexa insystem was 2021  ASSESSMENT AND PLAN:  Discussed the following assessment and plan:  Chronic post-traumatic stress disorder (PTSD) - Plan: Ambulatory referral to Psychiatry  Medication management - Plan: Ambulatory referral to Psychiatry  Estrogen deficiency - Plan: DG Bone Density  Chronic pain of left knee  History of arm fracture  Stress Agree   will do referral for prob chronic ptsd as per  counselor  Sherran Seeds advice  for more . Medication consult . Reorder dexa  at [facility] since  bre[facility] no longer does these  and [facility] backed up until April .  Knee predicament is  limiting and difficult and has had a number or opinions and then interventions  uncertain next step  but proceed with her course of injections and then get multiple opinions as appropriate before any more surgeries .  She is in chronic  pain management for neck and back spinal but stable and no ramping up and was function before knee and  other.  Had cogn neuro deficit  and was on dexadrine from psych  stable but doing well with this  Is adherent and keeps appts  .  -Patient advised to return or notify health care team  if  new concerns arise.  Patient  Instructions  Will do referral  as discussed .  Placed dexa order for [facility] radiology Call then for appt time .  Plan lab in April and visit  Nyzier Boivin K. Aristotle Lieb M.D.

## 2023-10-11 NOTE — Patient Instructions (Addendum)
 Will do referral  as discussed .  Placed dexa order for [facility] radiology Call then for appt time .  Plan lab in April and visit

## 2023-10-12 ENCOUNTER — Telehealth: Payer: Self-pay | Admitting: Internal Medicine

## 2023-10-12 NOTE — Telephone Encounter (Signed)
 Copied from CRM [id number]. Topic: Referral - Question >> Oct 12, 2023  1:39 PM Lauren C wrote: Reason for CRM: Pt called in to see if psychiatry referral was sent, she called [facility] and they said a referral was not received. She was under the impression the dr was sending the referral yesterday. I let her know of 7-10 day turnaround for referrals and she started crying. I asked to see if there was something acute she was dealing with to see if she needed triage, but declined, stating if it wasn't something going on now she wouldn't be calling about the referral. I said I would send a message and see what I could do since she needs to be seen as soon as possible for these mental health symptoms.

## 2023-10-19 NOTE — Telephone Encounter (Signed)
 Pt has been scheduled for 11/04 at 3:30pm

## 2023-10-24 ENCOUNTER — Encounter: Payer: Self-pay | Admitting: Internal Medicine

## 2023-10-30 NOTE — Telephone Encounter (Signed)
 Noted

## 2023-11-03 ENCOUNTER — Encounter: Payer: Self-pay | Admitting: Internal Medicine

## 2023-11-08 NOTE — Telephone Encounter (Unsigned)
 Copied from CRM #[id number]. Topic: General - Other >> Nov 08, 2023  4:11 PM Burnard DEL wrote: Reason for CRM: Patient called I to make sure surgical clearance form was received by provider ? And she would ike to know when It has been completed and faxed back.

## 2023-11-09 ENCOUNTER — Telehealth: Payer: Self-pay | Admitting: Internal Medicine

## 2023-11-09 ENCOUNTER — Telehealth: Payer: Self-pay | Admitting: *Deleted

## 2023-11-09 NOTE — Telephone Encounter (Signed)
 Copied from CRM [id number]. Topic: General - Other >> Nov 09, 2023  1:28 PM Paige D wrote: Reason for CRM: orthopedic dr sent an authorization about a week ago, pt is calling asking if some one can look into this and can't get a date on her surgery until she gets her authorization form.  She said that she was told it was put in pcp mailbox but pt still has not heard anything please call pt

## 2023-11-09 NOTE — Telephone Encounter (Signed)
 Please see other encounter.

## 2023-11-09 NOTE — Telephone Encounter (Signed)
 Follow up with pt.   Pt is aware that form was faxed. Pt updates she got a called and has an upcoming visit on 01/26/2024 at [facility]. No further action is needed at this time.

## 2023-11-09 NOTE — Telephone Encounter (Unsigned)
 Copied from CRM [id number]. Topic: Clinical - Refused Triage >> Nov 09, 2023  1:31 PM Carlyon D wrote: Patient/caller voiced complaints of knee pain. Declined transfer to triage. Sees pain management and is waiting for ortho surgery.

## 2023-11-14 ENCOUNTER — Other Ambulatory Visit (HOSPITAL_BASED_OUTPATIENT_CLINIC_OR_DEPARTMENT_OTHER): Payer: Self-pay | Admitting: Internal Medicine

## 2023-11-14 ENCOUNTER — Ambulatory Visit

## 2023-11-14 VITALS — Wt 194.0 lb

## 2023-11-14 DIAGNOSIS — Z0000 Encounter for general adult medical examination without abnormal findings: Secondary | ICD-10-CM | POA: Diagnosis not present

## 2023-11-14 DIAGNOSIS — Z1231 Encounter for screening mammogram for malignant neoplasm of breast: Secondary | ICD-10-CM

## 2023-11-14 NOTE — Progress Notes (Addendum)
 Chief Complaint  Patient presents with   Medicare Wellness     Subjective:   Toni [name] is a 68 y.o. female who presents for a Medicare Annual Wellness Visit.  Allergies (verified) Codeine and Crestor  [rosuvastatin ]   NO CHANGES IN PHQ9 FROM 10/11/2023-PER PT  History: Past Medical History:  Diagnosis Date   Brachial neuritis or radiculitis NOS    Chronic hepatitis C (HCC) 10/05/2015   treated and rsolved   Depressive disorder, not elsewhere classified    Essential and other specified forms of tremor    Fracture of 5th metatarsal 08/13/2017   Fracture of multiple pubic rami (HCC) 08/13/2017   Headache(784.0)    Hepatitis C infection 12/05/2017   Treated and now resolved and cured  as of early November 30, 2016  And      Memory loss    better see evaluation   Microscopic hematuria    seed dr chales in past   Migraine, unspecified, without mention of intractable migraine without mention of status migrainosus    Multiple rib fractures 08/13/2017   Other specified disorders of rotator cuff syndrome of shoulder and allied disorders    Vulvar pain    rx with valium  supp   Past Surgical History:  Procedure Laterality Date   BREAST EXCISIONAL BIOPSY Right    No scar   CESAREAN SECTION     x3   SHOULDER ARTHROSCOPY W/ ROTATOR CUFF REPAIR Right 12/01/02   SHOULDER ARTHROSCOPY W/ ROTATOR CUFF REPAIR Left 12/01/2006   Family History  Problem Relation Age of Onset   Headache Mother    Stroke Father        father passed away june17   Diabetes Maternal Grandmother    Other Son        Mother is deceased Dec 01, 2011 father moved to the area 2012/11/30.   Social History   Occupational History   Occupation: rETIRED  Tobacco Use   Smoking status: Former    Current packs/day: 0.00    Types: Cigarettes    Quit date: 05/23/1978    Years since quitting: 45.5   Smokeless tobacco: Never  Vaping Use   Vaping status: Never Used  Substance and Sexual Activity   Alcohol use: No     Alcohol/week: 0.0 standard drinks of alcohol   Drug use: Yes    Types: Hydrocodone    Sexual activity: Not Currently   Tobacco Counseling Counseling given: Not Answered  SDOH Screenings   Food Insecurity: No Food Insecurity (11/14/2023)  Housing: Unknown (11/14/2023)  Transportation Needs: No Transportation Needs (11/14/2023)  Utilities: Not At Risk (11/14/2023)  Alcohol Screen: Low Risk  (03/12/2023)  Depression (PHQ2-9): High Risk (10/11/2023)  Financial Resource Strain: Low Risk  (03/12/2023)  Physical Activity: Inactive (11/14/2023)  Social Connections: Moderately Integrated (11/14/2023)  Stress: Stress Concern Present (11/14/2023)  Tobacco Use: Medium Risk (11/14/2023)  Health Literacy: Adequate Health Literacy (11/14/2023)   Depression Screen  NO Changes from 10/11/2023    10/11/2023    2:35 PM 03/15/2023    2:30 PM 09/14/2022    5:16 PM 04/27/2022    2:12 PM 02/23/2022   12:37 PM 08/31/2021   10:37 AM 05/19/2021    2:20 PM  PHQ 2/9 Scores  PHQ - 2 Score 6 4 2 2 6 1 2   PHQ- 9 Score 15  12  10  9  19   7       Data saved with a previous flowsheet row definition     Goals Addressed  None    Visit info / Clinical Intake: Medicare Wellness Visit Type:: Subsequent Annual Wellness Visit Persons participating in visit:: patient Medicare Wellness Visit Mode:: Telephone If telephone:: video declined Because this visit was a virtual/telehealth visit:: vitals recorded from last visit If Telephone or Video please confirm:: I connected with the patient using audio enabled telemedicine application and verified that I am speaking with the correct person using two identifiers; I discussed the limitations of evaluation and management by telemedicine; The patient expressed understanding and agreed to proceed Patient Location:: Home Provider Location:: Home Information given by:: patient Interpreter Needed?: No Pre-visit prep was completed: yes AWV questionnaire completed by patient  prior to visit?: no Living arrangements:: (!) lives alone (has a programmer, multimedia) Patient's Overall Health Status Rating: (!) fair (due bad knee) Typical amount of pain: (!) a lot Does pain affect daily life?: (!) yes (walking) Are you currently prescribed opioids?: (!) yes  Dietary Habits and Nutritional Risks How many meals a day?: 2 (per pt-they are not meals/healthy snacks) Eats fruit and vegetables daily?: yes Most meals are obtained by: having others provide food; eating out (sometimes tenat cooks for her) In the last 2 weeks, have you had any of the following?: (!) nausea, vomiting, diarrhea (nausea from pain) Diabetic:: no  Functional Status Activities of Daily Living (to include ambulation/medication): Independent Ambulation: Independent with device- listed below Home Assistive Devices/Equipment: Eyeglasses Medication Administration: Independent Home Management: Independent Manage your own finances?: yes Primary transportation is: driving Concerns about vision?: no *vision screening is required for WTM* (wears eyeglasses/[employer]) Concerns about hearing?: no  Fall Screening Falls in the past year?: 1 Number of falls in past year: 0 Was there an injury with Fall?: 1 (Rt elbow surgery) Fall Risk Category Calculator: 2 Patient Fall Risk Level: Moderate Fall Risk  Fall Risk Patient at Risk for Falls Due to: Impaired balance/gait Fall risk Follow up: Falls evaluation completed; Falls prevention discussed  Home and Transportation Safety: All rugs have non-skid backing?: (!) no All stairs or steps have railings?: yes (2nd floor in home) Grab bars in the bathtub or shower?: yes Have non-skid surface in bathtub or shower?: yes Good home lighting?: yes Regular seat belt use?: yes Hospital stays in the last year:: (!) yes (major surgery from fall) How many hospital stays:: 4  Cognitive Assessment Difficulty concentrating, remembering, or making decisions? : yes (making  decisions-hard for her) Will 6CIT or Mini Cog be Completed: no 6CIT or Mini Cog Declined: patient alert, oriented, able to answer questions appropriately and recall recent events  Advance Directives (For Healthcare) Does Patient Have a Medical Advance Directive?: No Would patient like information on creating a medical advance directive?: No - Patient declined  Reviewed/Updated  Reviewed/Updated: Reviewed All (Medical, Surgical, Family, Medications, Allergies, Care Teams, Patient Goals)        Objective:    Today's Vitals   11/14/23 1504  Weight: 194 lb (88 kg)   Body mass index is 35.71 kg/m.  Current Medications (verified) Outpatient Encounter Medications as of 11/14/2023  Medication Sig   albuterol  (VENTOLIN  HFA) 108 (90 Base) MCG/ACT inhaler Inhale 2 puffs into the lungs every 6 (six) hours as needed for wheezing or shortness of breath.   Alum Hydroxide-Mag Trisilicate 80-14.2 MG CHEW Chew by mouth. CHEW 2 TABLETS BY MOUTH BEFORE EACH MEAL AT BEDTIME AS NEEDED FOR INDIGESTION OR HEARTBURN   amLODipine  (NORVASC ) 2.5 MG tablet TAKE 1 TABLET BY MOUTH DAILY   cetirizine (ZYRTEC) 10 MG tablet Take  10 mg by mouth daily as needed for allergies.   clonazePAM (KLONOPIN) 0.5 MG tablet Take by mouth.   desvenlafaxine  (PRISTIQ ) 50 MG 24 hr tablet Take 50 mg by mouth daily.   dextroamphetamine  (DEXEDRINE  SPANSULE) 15 MG 24 hr capsule Take 30 mg by mouth daily. 2 po q am and 1 po q noon 01/27/22: weaning it down to 1 a day.   fluticasone  (FLONASE ) 50 MCG/ACT nasal spray Place 1 spray into both nostrils daily.   gabapentin  (NEURONTIN ) 600 MG tablet Take 600 mg by mouth. PO TID FOR HEAD PAIN   HYDROcodone -acetaminophen  (NORCO) 10-325 MG tablet Take 1 tablet by mouth every 8 (eight) hours as needed.   ketoconazole  (NIZORAL ) 2 % cream APPLY TO AFFECTED AREA TWICE A DAY   levothyroxine  (SYNTHROID ) 50 MCG tablet Take 1 tablet (50 mcg total) by mouth daily.   Misc Natural Products (ESTROVEN +  ENERGY MAX STRENGTH) TABS Take by mouth daily.   Multiple Vitamins-Minerals (CENTRUM SILVER ADULT 50+) TABS Take 1 tablet by mouth daily.   SUMAtriptan  (IMITREX ) 25 MG tablet Take 1 tablet (25 mg total) by mouth as needed for migraine. May repeat in 2 hours if headache persists or recurs.   SUMAtriptan  6 MG/0.5ML SOAJ Use 1 syringe for migraines upon awakening.  Do not use with Imitrex  tablets. (Patient not taking: Reported on 11/14/2023)   Vitamin D , Cholecalciferol, 25 MCG (1000 UT) CAPS Take 1 capsule by mouth daily. (Patient not taking: Reported on 11/14/2023)   No facility-administered encounter medications on file as of 11/14/2023.   Hearing/Vision screen Hearing Screening - Comments:: Denies hearing difficulties   Vision Screening - Comments:: Wears eyeglasses sometimes/Dr. McCuen/UTD  Immunizations and Health Maintenance Health Maintenance  Topic Date Due   Mammogram  01/06/2023   COVID-19 Vaccine (10 - Pfizer risk 2025-26 season) 03/19/2024   Medicare Annual Wellness (AWV)  11/13/2024   Colonoscopy  03/29/2026   DTaP/Tdap/Td (2 - Tdap) 07/20/2027   Pneumococcal Vaccine: 50+ Years  Completed   Influenza Vaccine  Completed   DEXA SCAN  Completed   Hepatitis C Screening  Completed   Zoster Vaccines- Shingrix  Completed   Meningococcal B Vaccine  Aged Out   Hepatitis B Vaccines 19-59 Average Risk  Discontinued        Assessment/Plan:  This is a routine wellness examination for [place].  Patient Care Team: Panosh, Apolinar POUR, MD as PCP - General (Internal Medicine) Vincente Grip, MD as Consulting Physician (Psychiatry) C[place]ton Ferebee Desai, Sarah, PA-C as Physician Assistant (Neurosurgery) Jenel Carlin POUR, MD (Inactive) as Consulting Physician (Neurology) Leslee Reusing, MD as Consulting Physician (Ophthalmology)  I have personally reviewed and noted the following in the patient's chart:   Medical and social history Use of alcohol, tobacco or illicit drugs   Current medications and supplements including opioid prescriptions. Functional ability and status Nutritional status Physical activity Advanced directives List of other physicians Hospitalizations, surgeries, and ER visits in previous 12 months Vitals Screenings to include cognitive, depression, and falls Referrals and appointments  No orders of the defined types were placed in this encounter.  In addition, I have reviewed and discussed with patient certain preventive protocols, quality metrics, and best practice recommendations. A written personalized care plan for preventive services as well as general preventive health recommendations were provided to patient.   Toni [name], CMA   11/14/2023   Return in 1 year (on 11/13/2024).  After Visit Summary: (MyChart) Due to this being a telephonic visit, the after visit  summary with patients personalized plan was offered to patient via MyChart   Nurse Notes: Patient is due for a DEXA and order has been placed in October.  Patient is also past due for a mammogram.  Patient stated that she would call and get that set up.

## 2023-11-14 NOTE — Patient Instructions (Addendum)
 Ms. Toni [name],  Thank you for taking the time for your Medicare Wellness Visit. I appreciate your continued commitment to your health goals. Please review the care plan we discussed, and feel free to reach out if I can assist you further.  Please note that Annual Wellness Visits do not include a physical exam. Some assessments may be limited, especially if the visit was conducted virtually. If needed, we may recommend an in-person follow-up with your provider.  Ongoing Care Seeing your primary care provider every 3 to 6 months helps us  monitor your health and provide consistent, personalized care. Last office visit on 10/11/2023.    You are due for a bone density screening and a mammogram.    Referrals If a referral was made during last office visit, please contact the referred provider directly to check on the status. You are due for a bone density.  Please call.  [facility] Address: [address], [place], [place] [address] Phone: [phone]  Recommended Screenings:  Health Maintenance  Topic Date Due   Medicare Annual Wellness Visit  09/01/2022   Breast Cancer Screening  01/06/2023   COVID-19 Vaccine (10 - Pfizer risk 2025-26 season) 03/19/2024   Colon Cancer Screening  03/29/2026   DTaP/Tdap/Td vaccine (2 - Tdap) 07/20/2027   Pneumococcal Vaccine for age over 53  Completed   Flu Shot  Completed   DEXA scan (bone density measurement)  Completed   Hepatitis C Screening  Completed   Zoster (Shingles) Vaccine  Completed   Meningitis B Vaccine  Aged Out   Hepatitis B Vaccine  Discontinued       11/14/2023    3:07 PM  Advanced Directives  Does Patient Have a Medical Advance Directive? No  Would patient like information on creating a medical advance directive? No - Patient declined    Vision: Annual vision screenings are recommended for early detection of glaucoma, cataracts, and diabetic retinopathy. These exams can also reveal signs of chronic  conditions such as diabetes and high blood pressure.  Dental: Annual dental screenings help detect early signs of oral cancer, gum disease, and other conditions linked to overall health, including heart disease and diabetes.  Please see the attached documents for additional preventive care recommendations.

## 2023-11-15 ENCOUNTER — Telehealth: Payer: Self-pay

## 2023-11-15 NOTE — Telephone Encounter (Signed)
 Copied from CRM [id number]. Topic: General - Other >> Nov 14, 2023  3:54 PM Mia F wrote: Reason for CRM: Pt says she just has an Awv appt and after the appt the appt she ready the summary and it shows she declined advanced directives. She says she did not decline advance directives as she is due to have surgery soon and she would like to have it.

## 2023-12-08 ENCOUNTER — Other Ambulatory Visit

## 2023-12-15 ENCOUNTER — Encounter (HOSPITAL_BASED_OUTPATIENT_CLINIC_OR_DEPARTMENT_OTHER): Payer: Self-pay | Admitting: Radiology

## 2023-12-15 ENCOUNTER — Inpatient Hospital Stay (HOSPITAL_BASED_OUTPATIENT_CLINIC_OR_DEPARTMENT_OTHER)
Admission: RE | Admit: 2023-12-15 | Discharge: 2023-12-15 | Disposition: A | Source: Ambulatory Visit | Attending: Internal Medicine | Admitting: Internal Medicine

## 2023-12-15 DIAGNOSIS — Z1231 Encounter for screening mammogram for malignant neoplasm of breast: Secondary | ICD-10-CM

## 2023-12-19 ENCOUNTER — Encounter: Payer: Self-pay | Admitting: Internal Medicine

## 2023-12-25 ENCOUNTER — Ambulatory Visit

## 2023-12-25 ENCOUNTER — Other Ambulatory Visit: Payer: Self-pay

## 2023-12-25 DIAGNOSIS — M25662 Stiffness of left knee, not elsewhere classified: Secondary | ICD-10-CM | POA: Insufficient documentation

## 2023-12-25 DIAGNOSIS — R262 Difficulty in walking, not elsewhere classified: Secondary | ICD-10-CM | POA: Diagnosis present

## 2023-12-25 DIAGNOSIS — M6281 Muscle weakness (generalized): Secondary | ICD-10-CM | POA: Diagnosis present

## 2023-12-25 DIAGNOSIS — G8929 Other chronic pain: Secondary | ICD-10-CM | POA: Insufficient documentation

## 2023-12-25 DIAGNOSIS — M25562 Pain in left knee: Secondary | ICD-10-CM | POA: Diagnosis present

## 2023-12-25 NOTE — Therapy (Unsigned)
 " OUTPATIENT PHYSICAL THERAPY LOWER EXTREMITY EVALUATION   Patient Name: Toni [name] MRN: [id number] DOB:30-May-1955, 68 y.o., female Today's Date: 12/26/2023  END OF SESSION:  PT End of Session - 12/25/23 1434     Visit Number 1    Date for Recertification  03/19/24    PT Start Time 1430    PT Stop Time 1510    PT Time Calculation (min) 40 min    Activity Tolerance Patient limited by pain    Behavior During Therapy Anxious;Lability          Past Medical History:  Diagnosis Date   Brachial neuritis or radiculitis NOS    Chronic hepatitis C (HCC) 10/05/2015   treated and rsolved   Depressive disorder, not elsewhere classified    Essential and other specified forms of tremor    Fracture of 5th metatarsal 08/13/2017   Fracture of multiple pubic rami (HCC) 08/13/2017   Headache(784.0)    Hepatitis C infection 12/05/2017   Treated and now resolved and cured  as of early 2018  And      Memory loss    better see evaluation   Microscopic hematuria    seed dr chales in past   Migraine, unspecified, without mention of intractable migraine without mention of status migrainosus    Multiple rib fractures 08/13/2017   Other specified disorders of rotator cuff syndrome of shoulder and allied disorders    Vulvar pain    rx with valium  supp   Past Surgical History:  Procedure Laterality Date   BREAST EXCISIONAL BIOPSY Right    No scar   CESAREAN SECTION     x3   SHOULDER ARTHROSCOPY W/ ROTATOR CUFF REPAIR Right 2004   SHOULDER ARTHROSCOPY W/ ROTATOR CUFF REPAIR Left 2008   Patient Active Problem List   Diagnosis Date Noted   Easy bruising 03/21/2022   Common migraine with intractable migraine 12/10/2019   Chronic pain 08/13/2017   Occipital neuralgia of right side 10/21/2016   Chronic migraine 01/26/2015   Cervicogenic headache 12/16/2014   Subclinical hypothyroidism 12/16/2014   Memory loss 10/08/2014   Depression 10/08/2014   Generalized anxiety  disorder 10/08/2014   Tremor 10/08/2014   Major depressive disorder, single episode, moderate (HCC) 08/27/2014   Memory difficulties 08/27/2014   Degenerative disc disease, cervical 08/27/2014   Chronically on opiate therapy 08/27/2014    PCP: Charlett Apolinar POUR  REFERRING PROVIDER: Kay Kemps, MD  REFERRING DIAG: L knee osteoarthritis, pre op for TKA 01/31/23  THERAPY DIAG:  Decreased ROM of left knee  Difficulty in walking, not elsewhere classified  Chronic pain of left knee  Muscle weakness (generalized)  Rationale for Evaluation and Treatment: Rehabilitation  ONSET DATE: chronic greater than one year  SUBJECTIVE:   SUBJECTIVE STATEMENT: I never got better after my L knee arthroscopy and I have been losing mobility, cannot tolerate walking through grocery store, difficult to get out of house and drive  PERTINENT HISTORY: Injured L knee greater than one year ago, underwent arthroscopic repair, now pain progressing, to have TKA 01/26/24 so today is pre op visit PAIN:  Are you having pain? Yes: NPRS scale: 5 to 10 Pain location: L knee anterior Pain description: burning Aggravating factors: standing walking Relieving factors: lying on sofa, takes pain meds  PRECAUTIONS: None  RED FLAGS: None   WEIGHT BEARING RESTRICTIONS: No  FALLS:  Has patient fallen in last 6 months? Yes. Number of falls 1 had fracture R forearm with plate, occurred June  2025  LIVING ENVIRONMENT: Lives with: lives alone Lives in: House/apartment Stairs: Yes: Internal: one flight steps; on right going up, doesn't go up there much Has following equipment at home: Single point cane, Walker - 2 wheeled, and Environmental Consultant - 4 wheeled  OCCUPATION: retired/disabled  PLOF: Independent with household mobility with device  PATIENT GOALS: revcover function to be able to travel with my grand son  NEXT MD VISIT: 01/31/24. Scheduled for surgery, TKA L   OBJECTIVE:  Note: Objective measures were completed  at Evaluation unless otherwise noted.  DIAGNOSTIC FINDINGS: na  PATIENT SURVEYS:  Lower Extremity Functional Score: 23 / 80 = 28.7 %  COGNITION: Overall cognitive status: Within functional limits for tasks assessed     SENSATION: Wnl LE's  EDEMA:  Mild edema noted B lower legs  POSTURE: standing wide base of support, no varum or valgum noted  PALPATION: Pt tender L infrapatellar tendon  LOWER EXTREMITY ROM:  Active ROM Right eval Left eval  Hip flexion    Hip extension    Hip abduction    Hip adduction    Hip internal rotation    Hip external rotation    Knee flexion  118  Knee extension  0  Ankle dorsiflexion    Ankle plantarflexion    Ankle inversion    Ankle eversion     (Blank rows = not tested)  LOWER EXTREMITY MMT:  MMT Right eval Left eval  Hip flexion  P!4-  Hip extension    Hip abduction    Hip adduction    Hip internal rotation    Hip external rotation    Knee flexion  P! 4  Knee extension  P! 4  Ankle dorsiflexion    Ankle plantarflexion    Ankle inversion    Ankle eversion     (Blank rows = not tested)    FUNCTIONAL TESTS:  30 sec sit to stand attempted but pt c/o L knee  pain so not completed  GAIT: Distance walked: in clinic up to 74' Assistive device utilized: None Level of assistance: Modified independence Antalgic L, decreased stance time on L                                                                                                                               TREATMENT DATE: 12/25/23:  Evaluation, we discussed/ education regarding clearing pathways, removing throw rugs and tripping hazards.  Discussed whether she needs BSC at home but she has a good set up and doesn't think she needs.  She has a walker , advised her to label with her name and have it brought to hospital day after surgery to have it adjusted appropriately for her. Instructed her in the exercises as described below to perform to engage, provide motor  training for stability L LE in preparation for surgery.   PATIENT EDUCATION:  Education details: POC, goals Person educated: Patient Education method: Explanation Education comprehension: verbalized understanding,  returned demonstration, verbal cues required, tactile cues required, and needs further education  HOME EXERCISE PROGRAM: Access Code: [email or web address] URL: [email or web address] Date: 12/25/2023 Prepared by: Greig Credit  Exercises - Active Straight Leg Raise with Quad Set  - 1 x daily - 7 x weekly - 3 sets - 10 reps - Quad Sets with Compression Garment  - 1 x daily - 7 x weekly - 3 sets - 10 reps - Supine Ankle Pumps  - 1 x daily - 7 x weekly - 3 sets - 10 reps - Glute Squeezes with Compression Garment  - 1 x daily - 7 x weekly - 3 sets - 10 reps  ASSESSMENT:  CLINICAL IMPRESSION: Patient is a 68 y.o. female who was evaluated today by physical therapy evaluation .   ACTIVITY LIMITATIONS: carrying, lifting, bending, standing, squatting, sleeping, stairs, transfers, bed mobility, bathing, toileting, dressing, and locomotion level  PARTICIPATION LIMITATIONS: meal prep, cleaning, laundry, driving, shopping, community activity, and church  PERSONAL FACTORS: Age, Fitness, Past/current experiences, Time since onset of injury/illness/exacerbation, and 1-2 comorbidities: chronic pain, depression are also affecting patient's functional outcome.   REHAB POTENTIAL: Good  CLINICAL DECISION MAKING: Evolving/moderate complexity  EVALUATION COMPLEXITY: Moderate.   OBJECTIVE IMPAIRMENTS: decreased activity tolerance, decreased endurance, decreased mobility, difficulty walking, decreased ROM, decreased strength, increased edema, impaired perceived functional ability, impaired flexibility, postural dysfunction, and pain   GOALS: Goals reviewed with patient? Yes  SHORT TERM GOALS: Target date: 01/31/24 I in performance of home program in preparation for TKA L Baseline: Goal  status: INITIAL   LONG TERM GOALS: Target date: 03/19/24 12 weeks from preop appt:  30 sec sit to stand greater than 10 reps  Baseline: unable to complete due ot L knee pain Goal status: INITIAL  2.  ROM L knee -5 to 115 degrees Baseline: preop 0 to 118 degrees Goal status: INITIAL  3.  Strength with MMT L quads, hamstrings 4+/5, no lag with SLR and with L long arc quad Baseline: painful with testing for preop appt Goal status: INITIAL  4.  Lower Extremity Functional Score: 23 / 80 = 28.7 % improve to 60% or greater function Baseline:  Goal status: INITIAL  5.  Gait with LAD device greater than 6 min at 58m/sec for community ambulation Baseline:  Goal status: INITIAL    PLAN:  PT FREQUENCY: preop eval today, will establish frequency on first post op appt on 01/31/24  PT DURATION: 12 weeks  PLANNED INTERVENTIONS: 97110-Therapeutic exercises, 97530- Therapeutic activity, 97112- Neuromuscular re-education, 97535- Self Care, 02859- Manual therapy, 97016- Vasopneumatic device, Patient/Family education, and Cryotherapy  PLAN FOR NEXT SESSION: pt to return 02/01/24 for first appt after her L TKA on 01/26/24, will need reassessment and initiation of appropriate pain/ edema management and therex progression at that time.   Victormanuel Mclure L Briar Witherspoon, PT, DPT, OCS 12/26/2023, 1:03 PM  "

## 2024-01-09 NOTE — H&P (Signed)
 " Patient's anticipated LOS is less than 2 midnights, meeting these requirements: - Younger than 33 - Lives within 1 hour of care - Has a competent adult at home to recover with post-op recover - NO history of  - Chronic pain requiring opiods  - Diabetes  - Coronary Artery Disease  - Heart failure  - Heart attack  - Stroke  - DVT/VTE  - Cardiac arrhythmia  - Respiratory Failure/COPD  - Renal failure  - Anemia  - Advanced Liver disease     Toni [name] is an 69 y.o. female.    Chief Complaint: left knee pain  HPI: Pt is a 69 y.o. female complaining of left knee pain for multiple years. Pain had continually increased since the beginning. X-rays in the clinic show end-stage arthritic changes of the left knee. Pt has tried various conservative treatments which have failed to alleviate their symptoms, including injections and therapy. Various options are discussed with the patient. Risks, benefits and expectations were discussed with the patient. Patient understand the risks, benefits and expectations and wishes to proceed with surgery.   PCP:  Toni Apolinar POUR, MD  D/C Plans: Home  PMH: Past Medical History:  Diagnosis Date   Brachial neuritis or radiculitis NOS    Chronic hepatitis C (HCC) 10/05/2015   treated and rsolved   Depressive disorder, not elsewhere classified    Essential and other specified forms of tremor    Fracture of 5th metatarsal 08/13/2017   Fracture of multiple pubic rami (HCC) 08/13/2017   Headache(784.0)    Hepatitis C infection 12/05/2017   Treated and now resolved and cured  as of early 2018  And      Memory loss    better see evaluation   Microscopic hematuria    seed dr chales in past   Migraine, unspecified, without mention of intractable migraine without mention of status migrainosus    Multiple rib fractures 08/13/2017   Other specified disorders of rotator cuff syndrome of shoulder and allied disorders    Vulvar pain     rx with valium  supp    PSH: Past Surgical History:  Procedure Laterality Date   BREAST EXCISIONAL BIOPSY Right    No scar   CESAREAN SECTION     x3   SHOULDER ARTHROSCOPY W/ ROTATOR CUFF REPAIR Right 2004   SHOULDER ARTHROSCOPY W/ ROTATOR CUFF REPAIR Left 2008    Social History:  reports that she quit smoking about 45 years ago. Her smoking use included cigarettes. She has never used smokeless tobacco. She reports current drug use. Drug: Hydrocodone . She reports that she does not drink alcohol. BMI: Estimated body mass index is 35.71 kg/m as calculated from the following:   Height as of 10/11/23: 5' 1.8 (1.57 m).   Weight as of 11/14/23: 88 kg.  Lab Results  Component Value Date   ALBUMIN 4.8 04/12/2023   Diabetes: Patient does not have a diagnosis of diabetes.     Smoking Status:      Allergies:  Allergies[1]  Medications: No current facility-administered medications for this encounter.   Current Outpatient Medications  Medication Sig Dispense Refill   albuterol  (VENTOLIN  HFA) 108 (90 Base) MCG/ACT inhaler Inhale 2 puffs into the lungs every 6 (six) hours as needed for wheezing or shortness of breath. 1 each 1   Alum Hydroxide-Mag Trisilicate 80-14.2 MG CHEW Chew by mouth. CHEW 2 TABLETS BY MOUTH BEFORE EACH MEAL AT BEDTIME AS NEEDED FOR INDIGESTION OR HEARTBURN  amLODipine  (NORVASC ) 2.5 MG tablet TAKE 1 TABLET BY MOUTH DAILY 90 tablet 1   cetirizine (ZYRTEC) 10 MG tablet Take 10 mg by mouth daily as needed for allergies.     clonazePAM (KLONOPIN) 0.5 MG tablet Take by mouth.     desvenlafaxine  (PRISTIQ ) 50 MG 24 hr tablet Take 50 mg by mouth daily.     dextroamphetamine  (DEXEDRINE  SPANSULE) 15 MG 24 hr capsule Take 30 mg by mouth daily. 2 po q am and 1 po q noon 01/27/22: weaning it down to 1 a day.     fluticasone  (FLONASE ) 50 MCG/ACT nasal spray Place 1 spray into both nostrils daily. 16 g 5   gabapentin  (NEURONTIN ) 600 MG tablet Take 600 mg by mouth. PO TID FOR  HEAD PAIN     HYDROcodone -acetaminophen  (NORCO) 10-325 MG tablet Take 1 tablet by mouth every 8 (eight) hours as needed.     ketoconazole  (NIZORAL ) 2 % cream APPLY TO AFFECTED AREA TWICE A DAY 30 g 2   levothyroxine  (SYNTHROID ) 50 MCG tablet Take 1 tablet (50 mcg total) by mouth daily. 90 tablet 3   Misc Natural Products (ESTROVEN + ENERGY MAX STRENGTH) TABS Take by mouth daily.     Multiple Vitamins-Minerals (CENTRUM SILVER ADULT 50+) TABS Take 1 tablet by mouth daily.     SUMAtriptan  (IMITREX ) 25 MG tablet Take 1 tablet (25 mg total) by mouth as needed for migraine. May repeat in 2 hours if headache persists or recurs. 10 tablet 11   SUMAtriptan  6 MG/0.5ML SOAJ Use 1 syringe for migraines upon awakening.  Do not use with Imitrex  tablets. (Patient not taking: Reported on 11/14/2023) 0.5 mL 6   Vitamin D , Cholecalciferol, 25 MCG (1000 UT) CAPS Take 1 capsule by mouth daily. (Patient not taking: Reported on 11/14/2023)      No results found for this or any previous visit (from the past 48 hours). No results found.  ROS: Pain with rom of the left lower extremity  Physical Exam: Alert and oriented 69 y.o. female in no acute distress Cranial nerves 2-12 intact Cervical spine: full rom with no tenderness, nv intact distally Chest: active breath sounds bilaterally, no wheeze rhonchi or rales Heart: regular rate and rhythm, no murmur Abd: non tender non distended with active bowel sounds Hip is stable with rom  Left knee painful rom with crepitus Nv intact distally  Assessment/Plan Assessment: left knee end stage osteoarthritis  Plan:  Patient will undergo a left total knee by Dr. Kay at [place] Risks benefits and expectations were discussed with the patient. Patient understand risks, benefits and expectations and wishes to proceed. Preoperative templating of the joint replacement has been completed, documented, and submitted to the Operating Room personnel in order to optimize  intra-operative equipment management.   Arvella Fireman PA-C, MPAS [facility] is now [name] [address], [address], [place], [place] [address] Phone: [phone] [email or web address] Facebook  Instagram  LinkedIn  Twitter        [1]  Allergies Allergen Reactions   Codeine Nausea And Vomiting and Other (See Comments)    Shuts down GI tract   Crestor  [Rosuvastatin ] Diarrhea    Patient  reported diarrhea  see 7/25 note.   "

## 2024-01-11 ENCOUNTER — Other Ambulatory Visit: Payer: Self-pay | Admitting: Internal Medicine

## 2024-01-11 ENCOUNTER — Telehealth: Payer: Self-pay | Admitting: Internal Medicine

## 2024-01-11 NOTE — Telephone Encounter (Unsigned)
 Copied from CRM [id number]. Topic: Clinical - Medication Refill >> Jan 11, 2024  5:17 PM Lauren C wrote: Medication: fluticasone  (FLONASE ) 50 MCG/ACT nasal spray  Has the patient contacted their pharmacy? Yes Refill expired  This is the patient's preferred pharmacy:  [facility] [id number], [place] - [address] [place] [address] Phone: [phone] Fax: [phone]  Is this the correct pharmacy for this prescription? Yes If no, delete pharmacy and type the correct one.   Has the prescription been filled recently? Yes  Is the patient out of the medication? Yes  Has the patient been seen for an appointment in the last year OR does the patient have an upcoming appointment? Yes  Can we respond through MyChart? Yes  Agent: Please be advised that Rx refills may take up to 3 business days. We ask that you follow-up with your pharmacy.

## 2024-01-11 NOTE — Telephone Encounter (Unsigned)
 Copied from CRM [id number]. Topic: Clinical - Refused Triage >> Jan 11, 2024  5:19 PM Lauren C wrote: Patient voiced complaints of depression. She says her support system is always falling through, her husband died this year and her son died. She is unable to afford to have a will made. She wanted a virtual visit to see Dr. Charlett before her upcoming surgery, but no times avail. Declined transfer to triage and declined appointment. #[phone]

## 2024-01-12 ENCOUNTER — Telehealth: Payer: Self-pay | Admitting: Internal Medicine

## 2024-01-12 NOTE — Telephone Encounter (Unsigned)
 Copied from CRM [id number]. Topic: General - Other >> Nov 14, 2023  3:54 PM Mia F wrote: Reason for CRM: Pt says she just has an Awv appt and after the appt the appt she ready the summary and it shows she declined advanced directives. She says she did not decline advance directives as she is due to have surgery soon and she would like to have it. >> Jan 11, 2024  5:25 PM Lauren C wrote: Pt has not heard back on this request. She is requesting paperwork for medical POA and has an upcoming surgery. Please contact with this information.

## 2024-01-15 MED ORDER — FLUTICASONE PROPIONATE 50 MCG/ACT NA SUSP: 2.0000 | Freq: Every day | NASAL | 99 refills | Status: AC | PRN

## 2024-01-15 NOTE — Telephone Encounter (Signed)
 This has been noted in chart

## 2024-01-15 NOTE — Telephone Encounter (Signed)
 Please see other message.

## 2024-01-15 NOTE — Telephone Encounter (Signed)
 Follow up with pt. Pt states she is doing fine. Pt admitted that she was teary when she was calling last week and that the person she was talking to was nice to her.   Pt reports she found someone to be her healthcare power of attorney, her friend. She said they will meet up and discuss, have a witness and notarized it. Pt states she doesn't need anything at this time. Advise pt she can drop off a copy of it to us  at her convenience. Pt verbalized understanding.   Pt shared she is afraid about her surgery as she doesn't know what to expect. And just wants thing to work out. She will have surgery on 01/26/24 for total knee replacement. Pt reports she see counselor every 2 wks, will see one next week.   Pt states she doesn't need anything at this time. Advise pt to reach out to us  through mychart or call us  if anything. Pt verbalized understanding.   Forwarding to Dr. Charlett LIPS

## 2024-01-16 ENCOUNTER — Encounter (HOSPITAL_COMMUNITY): Payer: Self-pay

## 2024-01-16 NOTE — Patient Instructions (Signed)
 SURGICAL WAITING ROOM VISITATION  Patients having surgery or a procedure may have no more than 2 support people in the waiting area - these visitors may rotate.    Children ages 67 and under will not be able to visit patients in [facility] under most circumstances.   Visitors with respiratory illnesses are discouraged from visiting and should remain at home.  If the patient needs to stay at the hospital during part of their recovery, the visitor guidelines for inpatient rooms apply. Pre-op nurse will coordinate an appropriate time for 1 support person to accompany patient in pre-op.  This support person may not rotate.    Please refer to the [facility] website for the visitor guidelines for Inpatients (after your surgery is over and you are in a regular room).    Your procedure is scheduled on: Friday, Jan. 23, 2026   Report to [facility] Main Entrance    Report to admitting at 7:20 AM   Call this number if you have problems the morning of surgery [phone]   Do not eat food :After Midnight.   After Midnight you may have the following liquids until 6:50 AM DAY OF SURGERY  Water Non-Citrus Juices (without pulp, NO RED-Apple, White grape, White cranberry) Black Coffee (NO MILK/CREAM OR CREAMERS, sugar ok)  Clear Tea (NO MILK/CREAM OR CREAMERS, sugar ok) regular and decaf                             Plain Jell-O (NO RED)                                           Fruit ices (not with fruit pulp, NO RED)                                     Popsicles (NO RED)                                                               Sports drinks like Gatorade (NO RED)       The day of surgery:  Drink ONE (1) Pre-Surgery Clear Ensure at 6:50 AM the morning of surgery. Drink in one sitting. Do not sip.  This drink was given to you during your hospital  pre-op appointment visit. Nothing else to drink after completing the  Pre-Surgery Clear Ensure.          If you have  questions, please contact your surgeons office.   FOLLOW BOWEL PREP AND ANY ADDITIONAL PRE OP INSTRUCTIONS YOU RECEIVED FROM YOUR SURGEON'S OFFICE!!!     Oral Hygiene is also important to reduce your risk of infection.                                    Remember - BRUSH YOUR TEETH THE MORNING OF SURGERY WITH YOUR REGULAR TOOTHPASTE  DENTURES WILL BE REMOVED PRIOR TO SURGERY PLEASE DO NOT APPLY Poly grip OR ADHESIVES!!!   Stop  all vitamins and herbal supplements 7 days before surgery.   Take these medicines the morning of surgery with A SIP OF WATER:  Amlodipine  Cetirizine if needed Clonazepam if needed Desvenlafaxine  Gabapentin  Hydrocodone  if needed and can tolerate on empty stomach Levothyroxine  May use Flonase  if needed Use Sumatriptan  per normal routine Bring Albuterol  Inhaler             You may not have any metal on your body including hair pins, jewelry, and body piercing             Do not wear make-up, lotions, powders, perfumes, or deodorant  Do not wear nail polish including gel and S&S, artificial/acrylic nails, or any other type of covering on natural nails including finger and toenails. If you have artificial nails, gel coating, etc. that needs to be removed by a nail salon please have this removed prior to surgery or surgery may need to be canceled/ delayed if the surgeon/ anesthesia feels like they are unable to be safely monitored.   Do not shave  48 hours prior to surgery.              Do not bring valuables to the hospital. [facility] IS NOT             RESPONSIBLE   FOR VALUABLES.   Contacts, glasses, dentures or bridgework may not be worn into surgery.   Bring small overnight bag day of surgery.   DO NOT BRING YOUR HOME MEDICATIONS TO THE HOSPITAL. PHARMACY WILL DISPENSE MEDICATIONS LISTED ON YOUR MEDICATION LIST TO YOU DURING YOUR ADMISSION IN THE HOSPITAL!    Special Instructions: Bring a copy of your healthcare power of attorney and living will  documents the day of surgery if you haven't scanned them before.              Please read over the following fact sheets you were given: IF YOU HAVE QUESTIONS ABOUT YOUR PRE-OP INSTRUCTIONS PLEASE CALL [phone].   If you received a COVID test during your pre-op visit  it is requested that you wear a mask when out in public, stay away from anyone that may not be feeling well and notify your surgeon if you develop symptoms. If you test positive for Covid or have been in contact with anyone that has tested positive in the last 10 days please notify you surgeon.      Pre-operative 4 CHG Bath Instructions  DYNA-Hex 4 Chlorhexidine Gluconate 4% Solution Antiseptic 4 fl. oz   You can play a key role in reducing the risk of infection after surgery. Your skin needs to be as free of germs as possible. You can reduce the number of germs on your skin by washing with CHG (chlorhexidine gluconate) soap before surgery. CHG is an antiseptic soap that kills germs and continues to kill germs even after washing.   DO NOT use if you have an allergy to chlorhexidine/CHG or antibacterial soaps. If your skin becomes reddened or irritated, stop using the CHG and notify one of our RNs at   Please shower with the CHG soap starting 4 days before surgery using the following schedule:     Please keep in mind the following:  DO NOT shave, including legs and underarms, starting the day of your first shower.   You may shave your face at any point before/day of surgery.  Place clean sheets on your bed the day you start using CHG soap. Use a clean washcloth (not  used since being washed) for each shower. DO NOT sleep with pets once you start using the CHG.  CHG Shower Instructions:  If you choose to wash your hair and private area, wash first with your normal shampoo/soap.  After you use shampoo/soap, rinse your hair and body thoroughly to remove shampoo/soap residue.  Turn the water OFF and apply about 3  tablespoons (45 ml) of CHG soap to a CLEAN washcloth.  Apply CHG soap ONLY FROM YOUR NECK DOWN TO YOUR TOES (washing for 3-5 minutes)  DO NOT use CHG soap on face, private areas, open wounds, or sores.  Pay special attention to the area where your surgery is being performed.  If you are having back surgery, having someone wash your back for you may be helpful. Wait 2 minutes after CHG soap is applied, then you may rinse off the CHG soap.  Pat dry with a clean towel  Put on clean clothes/pajamas   If you choose to wear lotion, please use ONLY the CHG-compatible lotions on the back of this paper.     Additional instructions for the day of surgery: DO NOT APPLY any lotions, deodorants, cologne, or perfumes.   Put on clean/comfortable clothes.  Brush your teeth.  Ask your nurse before applying any prescription medications to the skin.   CHG Compatible Lotions   Aveeno Moisturizing lotion  Cetaphil Moisturizing Cream  Cetaphil Moisturizing Lotion  Clairol Herbal Essence Moisturizing Lotion, Dry Skin  Clairol Herbal Essence Moisturizing Lotion, Extra Dry Skin  Clairol Herbal Essence Moisturizing Lotion, Normal Skin  Curel Age Defying Therapeutic Moisturizing Lotion with Alpha Hydroxy  Curel Extreme Care Body Lotion  Curel Soothing Hands Moisturizing Hand Lotion  Curel Therapeutic Moisturizing Cream, Fragrance-Free  Curel Therapeutic Moisturizing Lotion, Fragrance-Free  Curel Therapeutic Moisturizing Lotion, Original Formula  Eucerin Daily Replenishing Lotion  Eucerin Dry Skin Therapy Plus Alpha Hydroxy Crme  Eucerin Dry Skin Therapy Plus Alpha Hydroxy Lotion  Eucerin Original Crme  Eucerin Original Lotion  Eucerin Plus Crme Eucerin Plus Lotion  Eucerin TriLipid Replenishing Lotion  Keri Anti-Bacterial Hand Lotion  Keri Deep Conditioning Original Lotion Dry Skin Formula Softly Scented  Keri Deep Conditioning Original Lotion, Fragrance Free Sensitive Skin Formula  Keri Lotion Fast  Absorbing Fragrance Free Sensitive Skin Formula  Keri Lotion Fast Absorbing Softly Scented Dry Skin Formula  Keri Original Lotion  Keri Skin Renewal Lotion Keri Silky Smooth Lotion  Keri Silky Smooth Sensitive Skin Lotion  Nivea Body Creamy Conditioning Oil  Nivea Body Extra Enriched Lotion  Nivea Body Original Lotion  Nivea Body Sheer Moisturizing Lotion Nivea Crme  Nivea Skin Firming Lotion  NutraDerm 30 Skin Lotion  NutraDerm Skin Lotion  NutraDerm Therapeutic Skin Cream  NutraDerm Therapeutic Skin Lotion  ProShield Protective Hand Cream  Provon moisturizing lotion  Incentive Spirometer (Watch this video at home: [email or web address])  An incentive spirometer is a tool that can help keep your lungs clear and active. This tool measures how well you are filling your lungs with each breath. Taking long deep breaths may help reverse or decrease the chance of developing breathing (pulmonary) problems (especially infection) following: A long period of time when you are unable to move or be active. BEFORE THE PROCEDURE  If the spirometer includes an indicator to show your best effort, your nurse or respiratory therapist will set it to a desired goal. If possible, sit up straight or lean slightly forward. Try not to slouch. Hold the incentive spirometer in an upright position. INSTRUCTIONS FOR USE  Sit on the edge of your bed if possible, or sit up as far as you can in bed or on a chair. Hold the incentive spirometer in an upright position. Breathe out normally. Place the mouthpiece in your mouth and seal your lips tightly around it. Breathe in slowly and as deeply as possible, raising the piston or the ball toward the top of the column. Hold your breath for 3-5 seconds or for as long as possible. Allow the piston or ball to fall to the bottom of the column. Remove the mouthpiece from your mouth and breathe out normally. Rest for a few seconds and repeat Steps 1  through 7 at least 10 times every 1-2 hours when you are awake. Take your time and take a few normal breaths between deep breaths. The spirometer may include an indicator to show your best effort. Use the indicator as a goal to work toward during each repetition. After each set of 10 deep breaths, practice coughing to be sure your lungs are clear. If you have an incision (the cut made at the time of surgery), support your incision when coughing by placing a pillow or rolled up towels firmly against it. Once you are able to get out of bed, walk around indoors and cough well. You may stop using the incentive spirometer when instructed by your caregiver.  RISKS AND COMPLICATIONS Take your time so you do not get dizzy or light-headed. If you are in pain, you may need to take or ask for pain medication before doing incentive spirometry. It is harder to take a deep breath if you are having pain. AFTER USE Rest and breathe slowly and easily. It can be helpful to keep track of a log of your progress. Your caregiver can provide you with a simple table to help with this. If you are using the spirometer at home, follow these instructions: SEEK MEDICAL CARE IF:  You are having difficultly using the spirometer. You have trouble using the spirometer as often as instructed. Your pain medication is not giving enough relief while using the spirometer. You develop fever of 100.5 F (38.1 C) or higher. SEEK IMMEDIATE MEDICAL CARE IF:  You cough up bloody sputum that had not been present before. You develop fever of 102 F (38.9 C) or greater. You develop worsening pain at or near the incision site. MAKE SURE YOU:  Understand these instructions. Will watch your condition. Will get help right away if you are not doing well or get worse. Document Released: 05/02/2006 Document Revised: 03/14/2011 Document Reviewed: 07/03/2006 [facility] Patient Information 2014 [place], [place].

## 2024-01-17 ENCOUNTER — Other Ambulatory Visit: Payer: Self-pay

## 2024-01-17 ENCOUNTER — Encounter (HOSPITAL_COMMUNITY)
Admission: RE | Admit: 2024-01-17 | Discharge: 2024-01-17 | Disposition: A | Discharge status: Home / Self Care | Source: Ambulatory Visit | Attending: Orthopedic Surgery | Admitting: Orthopedic Surgery

## 2024-01-17 ENCOUNTER — Encounter (HOSPITAL_COMMUNITY): Payer: Self-pay

## 2024-01-17 VITALS — BP 142/99 | HR 93 | Temp 97.9°F | Resp 16 | Ht 62.0 in | Wt 197.0 lb

## 2024-01-17 DIAGNOSIS — I10 Essential (primary) hypertension: Secondary | ICD-10-CM | POA: Insufficient documentation

## 2024-01-17 DIAGNOSIS — Z01818 Encounter for other preprocedural examination: Secondary | ICD-10-CM | POA: Insufficient documentation

## 2024-01-17 DIAGNOSIS — K759 Inflammatory liver disease, unspecified: Secondary | ICD-10-CM

## 2024-01-17 HISTORY — DX: Other complications of anesthesia, initial encounter: T88.59XA

## 2024-01-17 HISTORY — DX: Post-traumatic stress disorder, chronic: F43.12

## 2024-01-17 HISTORY — DX: Unspecified osteoarthritis, unspecified site: M19.90

## 2024-01-17 HISTORY — DX: Hypothyroidism, unspecified: E03.9

## 2024-01-17 HISTORY — DX: Occipital neuralgia: M54.81

## 2024-01-17 HISTORY — DX: Essential (primary) hypertension: I10

## 2024-01-17 LAB — CBC
HCT: 43.2 % (ref 36.0–46.0)
Hemoglobin: 13.7 g/dL (ref 12.0–15.0)
MCH: 29.3 pg (ref 26.0–34.0)
MCHC: 31.7 g/dL (ref 30.0–36.0)
MCV: 92.3 fL (ref 80.0–100.0)
Platelets: 421 K/uL — ABNORMAL HIGH (ref 150–400)
RBC: 4.68 MIL/uL (ref 3.87–5.11)
RDW: 12.9 % (ref 11.5–15.5)
WBC: 6.6 K/uL (ref 4.0–10.5)
nRBC: 0 % (ref 0.0–0.2)

## 2024-01-17 LAB — SURGICAL PCR SCREEN
MRSA, PCR: NEGATIVE
Staphylococcus aureus: NEGATIVE

## 2024-01-17 LAB — COMPREHENSIVE METABOLIC PANEL WITH GFR
ALT: 27 U/L (ref 0–44)
AST: 34 U/L (ref 15–41)
Albumin: 4.3 g/dL (ref 3.5–5.0)
Alkaline Phosphatase: 111 U/L (ref 38–126)
Anion gap: 13 (ref 5–15)
BUN: 22 mg/dL (ref 8–23)
CO2: 23 mmol/L (ref 22–32)
Calcium: 9.9 mg/dL (ref 8.9–10.3)
Chloride: 103 mmol/L (ref 98–111)
Creatinine, Ser: 0.89 mg/dL (ref 0.44–1.00)
GFR, Estimated: >60 mL/min
Glucose, Bld: 99 mg/dL (ref 70–99)
Potassium: 4.6 mmol/L (ref 3.5–5.1)
Sodium: 140 mmol/L (ref 135–145)
Total Bilirubin: 0.3 mg/dL (ref 0.0–1.2)
Total Protein: 7.6 g/dL (ref 6.5–8.1)

## 2024-01-17 NOTE — Progress Notes (Signed)
 Date of COVID positive in last 90 days:  PCP - Apolinar Eastern, MD LOV 10/11/23 Cardiologist - Dr. Loni, MD LOV 2021  Chest x-ray - 06/18/23  EKG - N/A Stress Test - 02/14/19 Epic ECHO - 02/14/19 Epic Cardiac Cath - N/A Pacemaker/ICD device last checked:N/A Spinal Cord Stimulator:N/A  Bowel Prep - N/A  Sleep Study - N/A CPAP -   Fasting Blood Sugar - N/A Checks Blood Sugar _____ times a day  Last dose of GLP1 agonist-  N/A GLP1 instructions:  Do not take after     Last dose of SGLT-2 inhibitors-  N/A SGLT-2 instructions:  Do not take after     Blood Thinner Instructions: N/A Last dose:   Time: Aspirin Instructions:N/A Last Dose:  Activity level: Can go up a flight of stairs and perform activities of daily living without stopping and without symptoms of chest pain or shortness of breath.   Anesthesia review: N/A  Patient denies shortness of breath, fever, cough and chest pain at PAT appointment  Patient verbalized understanding of instructions that were given to them at the PAT appointment. Patient was also instructed that they will need to review over the PAT instructions again at home before surgery.

## 2024-01-18 NOTE — Telephone Encounter (Signed)
 Noted   Regards  for the best    hopefully   eventually knee and mobility will help in future quality of life .

## 2024-01-19 ENCOUNTER — Encounter (HOSPITAL_COMMUNITY)

## 2024-01-25 ENCOUNTER — Telehealth: Payer: Self-pay | Admitting: Internal Medicine

## 2024-01-25 ENCOUNTER — Encounter: Payer: Self-pay | Admitting: Internal Medicine

## 2024-01-25 NOTE — Telephone Encounter (Signed)
 Copied from CRM #[id number]. Topic: General - Other >> Jan 25, 2024 10:08 AM Deaijah H wrote: Reason for CRM: Patient called in stating she was advised by her insurance she needs first of the year paperwork in order to put Dr. Charlett as her primary doctor. Please call to further assist

## 2024-01-25 NOTE — Telephone Encounter (Signed)
 Contacted pt and inform her we did not receive any form her insurance. Offer to call pt's insurance to see if there is something our office can do.   Contacted pt's insurance and spoke to Lynwood FALCON, a representative with [facility]. He inform pt has access to portal to change her PCP. Advise pt to call member service team. Follow up with pt and update her information above. Pt is aware and states she had call them via that number on the back of insurance card. But will try again. Inform pt they can fax us  form if need anything for us  to fill out. Pt verbalized understanding.

## 2024-01-26 ENCOUNTER — Other Ambulatory Visit: Payer: Self-pay

## 2024-01-26 ENCOUNTER — Encounter (HOSPITAL_COMMUNITY): Admission: RE | Disposition: A | Payer: Self-pay | Source: Home / Self Care | Attending: Orthopedic Surgery

## 2024-01-26 ENCOUNTER — Encounter (HOSPITAL_COMMUNITY): Payer: Self-pay | Admitting: Orthopedic Surgery

## 2024-01-26 ENCOUNTER — Other Ambulatory Visit (HOSPITAL_COMMUNITY): Payer: Self-pay

## 2024-01-26 ENCOUNTER — Ambulatory Visit (HOSPITAL_COMMUNITY): Payer: Self-pay

## 2024-01-26 ENCOUNTER — Inpatient Hospital Stay (HOSPITAL_COMMUNITY)
Admission: AD | Admit: 2024-01-26 | Discharge: 2024-01-30 | DRG: 470 | Disposition: A | Discharge status: Home / Self Care | Attending: Orthopedic Surgery | Admitting: Orthopedic Surgery

## 2024-01-26 DIAGNOSIS — Z79899 Other long term (current) drug therapy: Secondary | ICD-10-CM

## 2024-01-26 DIAGNOSIS — I10 Essential (primary) hypertension: Secondary | ICD-10-CM | POA: Diagnosis present

## 2024-01-26 DIAGNOSIS — Z8619 Personal history of other infectious and parasitic diseases: Secondary | ICD-10-CM

## 2024-01-26 DIAGNOSIS — F4312 Post-traumatic stress disorder, chronic: Secondary | ICD-10-CM | POA: Diagnosis present

## 2024-01-26 DIAGNOSIS — E039 Hypothyroidism, unspecified: Secondary | ICD-10-CM | POA: Diagnosis present

## 2024-01-26 DIAGNOSIS — M1712 Unilateral primary osteoarthritis, left knee: Principal | ICD-10-CM | POA: Diagnosis present

## 2024-01-26 DIAGNOSIS — Z79890 Hormone replacement therapy (postmenopausal): Secondary | ICD-10-CM

## 2024-01-26 DIAGNOSIS — F32A Depression, unspecified: Secondary | ICD-10-CM | POA: Diagnosis present

## 2024-01-26 DIAGNOSIS — K5909 Other constipation: Secondary | ICD-10-CM | POA: Diagnosis not present

## 2024-01-26 DIAGNOSIS — Z888 Allergy status to other drugs, medicaments and biological substances status: Secondary | ICD-10-CM

## 2024-01-26 DIAGNOSIS — Z96652 Presence of left artificial knee joint: Principal | ICD-10-CM

## 2024-01-26 DIAGNOSIS — Z6835 Body mass index (BMI) 35.0-35.9, adult: Secondary | ICD-10-CM

## 2024-01-26 DIAGNOSIS — Z87891 Personal history of nicotine dependence: Secondary | ICD-10-CM

## 2024-01-26 DIAGNOSIS — Z885 Allergy status to narcotic agent status: Secondary | ICD-10-CM

## 2024-01-26 MED ORDER — CHLORHEXIDINE GLUCONATE 0.12 % MT SOLN: 15.0000 mL | Freq: Once | OROMUCOSAL | Status: DC

## 2024-01-26 MED ORDER — FENTANYL CITRATE (PF) 50 MCG/ML IJ SOSY
50.0000 ug | PREFILLED_SYRINGE | INTRAMUSCULAR | Status: DC
  Administered 2024-01-26: 100 ug via INTRAVENOUS
  Filled 2024-01-26: qty 2

## 2024-01-26 MED ORDER — ALBUTEROL SULFATE (2.5 MG/3ML) 0.083% IN NEBU: 3.0000 mL | INHALATION_SOLUTION | Freq: Four times a day (QID) | RESPIRATORY_TRACT | Status: DC | PRN

## 2024-01-26 MED ORDER — LORATADINE 10 MG PO TABS
10.0000 mg | ORAL_TABLET | Freq: Every day | ORAL | Status: DC
  Administered 2024-01-30: 10 mg via ORAL
  Filled 2024-01-26 (×4): qty 1

## 2024-01-26 MED ORDER — GABAPENTIN 300 MG PO CAPS
600.0000 mg | ORAL_CAPSULE | Freq: Three times a day (TID) | ORAL | Status: DC
  Administered 2024-01-26 – 2024-01-30 (×11): 600 mg via ORAL
  Filled 2024-01-26 (×11): qty 2

## 2024-01-26 MED ORDER — AMLODIPINE BESYLATE 5 MG PO TABS
2.5000 mg | ORAL_TABLET | Freq: Every day | ORAL | Status: DC
  Administered 2024-01-27 – 2024-01-30 (×4): 2.5 mg via ORAL
  Filled 2024-01-26 (×4): qty 1

## 2024-01-26 MED ORDER — FLUTICASONE PROPIONATE 50 MCG/ACT NA SUSP: 2.0000 | Freq: Every day | NASAL | Status: DC | PRN

## 2024-01-26 MED ORDER — OXYCODONE HCL 5 MG PO TABS: 5.0000 mg | ORAL_TABLET | Freq: Once | ORAL | Status: DC | PRN

## 2024-01-26 MED ORDER — GABAPENTIN 300 MG PO CAPS
600.0000 mg | ORAL_CAPSULE | Freq: Once | ORAL | Status: AC
  Administered 2024-01-26: 600 mg via ORAL
  Filled 2024-01-26: qty 2

## 2024-01-26 MED ORDER — BUPIVACAINE LIPOSOME 1.3 % IJ SUSP
INTRAMUSCULAR | Status: AC
  Filled 2024-01-26: qty 20

## 2024-01-26 MED ORDER — ONDANSETRON HCL 4 MG PO TABS
4.0000 mg | ORAL_TABLET | Freq: Four times a day (QID) | ORAL | Status: DC | PRN
  Administered 2024-01-27 – 2024-01-28 (×2): 4 mg via ORAL
  Filled 2024-01-26 (×2): qty 1

## 2024-01-26 MED ORDER — HYDROMORPHONE HCL 2 MG PO TABS
1.0000 mg | ORAL_TABLET | ORAL | Status: DC | PRN
  Administered 2024-01-26 – 2024-01-30 (×12): 2 mg via ORAL
  Filled 2024-01-26 (×12): qty 1

## 2024-01-26 MED ORDER — MENTHOL 3 MG MT LOZG: 1.0000 | LOZENGE | OROMUCOSAL | Status: DC | PRN

## 2024-01-26 MED ORDER — CLONAZEPAM 0.5 MG PO TABS
0.5000 mg | ORAL_TABLET | Freq: Two times a day (BID) | ORAL | Status: DC | PRN
  Administered 2024-01-26 – 2024-01-30 (×7): 0.5 mg via ORAL
  Filled 2024-01-26 (×8): qty 1

## 2024-01-26 MED ORDER — BUPIVACAINE LIPOSOME 1.3 % IJ SUSP: 20.0000 mL | Freq: Once | INTRAMUSCULAR | Status: DC

## 2024-01-26 MED ORDER — DOCUSATE SODIUM 100 MG PO CAPS
100.0000 mg | ORAL_CAPSULE | Freq: Two times a day (BID) | ORAL | Status: DC
  Administered 2024-01-26 – 2024-01-30 (×8): 100 mg via ORAL
  Filled 2024-01-26 (×8): qty 1

## 2024-01-26 MED ORDER — TRANEXAMIC ACID-NACL 1000-0.7 MG/100ML-% IV SOLN
1000.0000 mg | Freq: Once | INTRAVENOUS | Status: AC
  Administered 2024-01-26: 1000 mg via INTRAVENOUS
  Filled 2024-01-26: qty 100

## 2024-01-26 MED ORDER — ACETAMINOPHEN 325 MG PO TABS
325.0000 mg | ORAL_TABLET | Freq: Four times a day (QID) | ORAL | Status: DC | PRN
  Administered 2024-01-27 – 2024-01-30 (×3): 650 mg via ORAL
  Filled 2024-01-26 (×2): qty 2

## 2024-01-26 MED ORDER — SODIUM CHLORIDE 0.9 % IR SOLN
Status: DC | PRN
  Administered 2024-01-26: 1000 mL

## 2024-01-26 MED ORDER — POVIDONE-IODINE 10 % EX SWAB: 2.0000 | Freq: Once | CUTANEOUS | Status: DC

## 2024-01-26 MED ORDER — LEVOTHYROXINE SODIUM 50 MCG PO TABS
50.0000 ug | ORAL_TABLET | Freq: Every day | ORAL | Status: DC
  Administered 2024-01-27 – 2024-01-30 (×4): 50 ug via ORAL
  Filled 2024-01-26 (×4): qty 1

## 2024-01-26 MED ORDER — SODIUM CHLORIDE 0.9 % IV SOLN: INTRAVENOUS | Status: AC

## 2024-01-26 MED ORDER — CEFAZOLIN SODIUM-DEXTROSE 2-4 GM/100ML-% IV SOLN
2.0000 g | INTRAVENOUS | Status: AC
  Administered 2024-01-26: 2 g via INTRAVENOUS
  Filled 2024-01-26: qty 100

## 2024-01-26 MED ORDER — ORAL CARE MOUTH RINSE: 15.0000 mL | Freq: Once | OROMUCOSAL | Status: DC

## 2024-01-26 MED ORDER — BUPIVACAINE-EPINEPHRINE (PF) 0.25% -1:200000 IJ SOLN
INTRAMUSCULAR | Status: AC
  Filled 2024-01-26: qty 30

## 2024-01-26 MED ORDER — WATER FOR IRRIGATION, STERILE IR SOLN
Status: DC | PRN
  Administered 2024-01-26: 2000 mL

## 2024-01-26 MED ORDER — SUMATRIPTAN SUCCINATE 25 MG PO TABS
25.0000 mg | ORAL_TABLET | ORAL | Status: DC | PRN
  Administered 2024-01-26 – 2024-01-27 (×2): 25 mg via ORAL
  Filled 2024-01-26 (×3): qty 1

## 2024-01-26 MED ORDER — CEFAZOLIN SODIUM-DEXTROSE 2-4 GM/100ML-% IV SOLN
2.0000 g | Freq: Four times a day (QID) | INTRAVENOUS | Status: AC
  Administered 2024-01-26 – 2024-01-27 (×2): 2 g via INTRAVENOUS
  Filled 2024-01-26 (×2): qty 100

## 2024-01-26 MED ORDER — ONDANSETRON HCL 4 MG PO TABS
4.0000 mg | ORAL_TABLET | Freq: Three times a day (TID) | ORAL | 0 refills | Status: AC | PRN
  Filled 2024-01-26: qty 30, 10d supply, fill #0

## 2024-01-26 MED ORDER — METOCLOPRAMIDE HCL 5 MG PO TABS: 5.0000 mg | ORAL_TABLET | Freq: Three times a day (TID) | ORAL | Status: DC | PRN

## 2024-01-26 MED ORDER — ONDANSETRON HCL 4 MG/2ML IJ SOLN
INTRAMUSCULAR | Status: DC | PRN
  Administered 2024-01-26: 4 mg via INTRAVENOUS

## 2024-01-26 MED ORDER — METOCLOPRAMIDE HCL 5 MG/ML IJ SOLN: 5.0000 mg | Freq: Three times a day (TID) | INTRAMUSCULAR | Status: DC | PRN

## 2024-01-26 MED ORDER — ADULT MULTIVITAMIN W/MINERALS CH
1.0000 | ORAL_TABLET | Freq: Every day | ORAL | Status: DC
  Administered 2024-01-27 – 2024-01-29 (×3): 1 via ORAL
  Filled 2024-01-26 (×4): qty 1

## 2024-01-26 MED ORDER — FENTANYL CITRATE (PF) 50 MCG/ML IJ SOSY: 25.0000 ug | PREFILLED_SYRINGE | INTRAMUSCULAR | Status: DC | PRN

## 2024-01-26 MED ORDER — METHOCARBAMOL 1000 MG/10ML IJ SOLN: 500.0000 mg | Freq: Four times a day (QID) | INTRAMUSCULAR | Status: DC | PRN

## 2024-01-26 MED ORDER — METHOCARBAMOL 500 MG PO TABS
500.0000 mg | ORAL_TABLET | Freq: Four times a day (QID) | ORAL | 1 refills | Status: AC | PRN
  Filled 2024-01-26: qty 60, 15d supply, fill #0

## 2024-01-26 MED ORDER — 0.9 % SODIUM CHLORIDE (POUR BTL) OPTIME
TOPICAL | Status: DC | PRN
  Administered 2024-01-26: 1000 mL

## 2024-01-26 MED ORDER — HYDROMORPHONE HCL 2 MG PO TABS
2.0000 mg | ORAL_TABLET | ORAL | 0 refills | Status: AC | PRN
  Filled 2024-01-26: qty 40, 7d supply, fill #0

## 2024-01-26 MED ORDER — PHENOL 1.4 % MT LIQD: 1.0000 | OROMUCOSAL | Status: DC | PRN

## 2024-01-26 MED ORDER — KETOCONAZOLE 2 % EX CREA: 1.0000 | TOPICAL_CREAM | Freq: Two times a day (BID) | CUTANEOUS | Status: DC | PRN

## 2024-01-26 MED ORDER — OXYCODONE HCL 5 MG/5ML PO SOLN: 5.0000 mg | Freq: Once | ORAL | Status: DC | PRN

## 2024-01-26 MED ORDER — ONDANSETRON HCL 4 MG/2ML IJ SOLN
INTRAMUSCULAR | Status: AC
  Filled 2024-01-26: qty 2

## 2024-01-26 MED ORDER — HYDROCODONE-ACETAMINOPHEN 10-325 MG PO TABS
1.0000 | ORAL_TABLET | Freq: Three times a day (TID) | ORAL | Status: DC | PRN
  Administered 2024-01-27 – 2024-01-29 (×4): 1 via ORAL
  Filled 2024-01-26 (×7): qty 1

## 2024-01-26 MED ORDER — SENNOSIDES-DOCUSATE SODIUM 8.6-50 MG PO TABS: 1.0000 | ORAL_TABLET | Freq: Every evening | ORAL | Status: DC | PRN

## 2024-01-26 MED ORDER — ROPIVACAINE HCL 5 MG/ML IJ SOLN
INTRAMUSCULAR | Status: DC | PRN
  Administered 2024-01-26: 30 mL via PERINEURAL

## 2024-01-26 MED ORDER — TRANEXAMIC ACID-NACL 1000-0.7 MG/100ML-% IV SOLN
1000.0000 mg | INTRAVENOUS | Status: AC
  Administered 2024-01-26: 1000 mg via INTRAVENOUS
  Filled 2024-01-26: qty 100

## 2024-01-26 MED ORDER — ACETAMINOPHEN 10 MG/ML IV SOLN: 1000.0000 mg | Freq: Once | INTRAVENOUS | Status: DC | PRN

## 2024-01-26 MED ORDER — DEXTROAMPHETAMINE SULFATE ER 5 MG PO CP24: 30.0000 mg | ORAL_CAPSULE | Freq: Every day | ORAL | Status: DC

## 2024-01-26 MED ORDER — MIDAZOLAM HCL 2 MG/2ML IJ SOLN
INTRAMUSCULAR | Status: AC
  Administered 2024-01-26: 2 mg
  Filled 2024-01-26: qty 2

## 2024-01-26 MED ORDER — BUPIVACAINE IN DEXTROSE 0.75-8.25 % IT SOLN
INTRATHECAL | Status: DC | PRN
  Administered 2024-01-26: 1.8 mL via INTRATHECAL

## 2024-01-26 MED ORDER — ONDANSETRON HCL 4 MG/2ML IJ SOLN
4.0000 mg | Freq: Four times a day (QID) | INTRAMUSCULAR | Status: DC | PRN
  Administered 2024-01-29 – 2024-01-30 (×2): 4 mg via INTRAVENOUS
  Filled 2024-01-26 (×2): qty 2

## 2024-01-26 MED ORDER — VENLAFAXINE HCL ER 75 MG PO CP24: 75.0000 mg | ORAL_CAPSULE | Freq: Every day | ORAL | Status: DC

## 2024-01-26 MED ORDER — PROPOFOL 500 MG/50ML IV EMUL
INTRAVENOUS | Status: DC | PRN
  Administered 2024-01-26: 85 ug/kg/min via INTRAVENOUS
  Administered 2024-01-26: 40 mg via INTRAVENOUS

## 2024-01-26 MED ORDER — SODIUM CHLORIDE (PF) 0.9 % IJ SOLN
INTRAMUSCULAR | Status: DC | PRN
  Administered 2024-01-26: 80 mL

## 2024-01-26 MED ORDER — ONDANSETRON HCL 4 MG/2ML IJ SOLN: 4.0000 mg | Freq: Once | INTRAMUSCULAR | Status: DC | PRN

## 2024-01-26 MED ORDER — ESTROVEN + ENERGY MAX STRENGTH PO TABS: 1.0000 | ORAL_TABLET | Freq: Every day | ORAL | Status: DC

## 2024-01-26 MED ORDER — HYDROMORPHONE HCL 1 MG/ML IJ SOLN
0.5000 mg | INTRAMUSCULAR | Status: DC | PRN
  Administered 2024-01-26 – 2024-01-29 (×10): 1 mg via INTRAVENOUS
  Filled 2024-01-26 (×10): qty 1

## 2024-01-26 MED ORDER — ALUM HYDROXIDE-MAG TRISILICATE 80-20 MG PO CHEW: 2.0000 | CHEWABLE_TABLET | Freq: Three times a day (TID) | ORAL | Status: DC | PRN

## 2024-01-26 MED ORDER — ASPIRIN 81 MG PO CHEW
81.0000 mg | CHEWABLE_TABLET | Freq: Two times a day (BID) | ORAL | Status: DC
  Administered 2024-01-26 – 2024-01-30 (×8): 81 mg via ORAL
  Filled 2024-01-26 (×8): qty 1

## 2024-01-26 MED ORDER — LACTATED RINGERS IV SOLN: INTRAVENOUS | Status: DC

## 2024-01-26 MED ORDER — ASPIRIN 81 MG PO CHEW
81.0000 mg | CHEWABLE_TABLET | Freq: Two times a day (BID) | ORAL | 0 refills | Status: AC
  Filled 2024-01-26: qty 60, 30d supply, fill #0

## 2024-01-26 MED ORDER — SODIUM CHLORIDE (PF) 0.9 % IJ SOLN
INTRAMUSCULAR | Status: AC
  Filled 2024-01-26: qty 30

## 2024-01-26 MED ORDER — PROPOFOL 1000 MG/100ML IV EMUL
INTRAVENOUS | Status: AC
  Filled 2024-01-26: qty 100

## 2024-01-26 MED ORDER — METHOCARBAMOL 500 MG PO TABS
500.0000 mg | ORAL_TABLET | Freq: Four times a day (QID) | ORAL | Status: DC | PRN
  Administered 2024-01-26 – 2024-01-30 (×10): 500 mg via ORAL
  Filled 2024-01-26 (×10): qty 1

## 2024-01-26 NOTE — Brief Op Note (Signed)
 01/26/2024  2:45 PM  4:04 PM  PATIENT:  Toni [name]  69 y.o. female  PRE-OPERATIVE DIAGNOSIS:  Primary osteoarthritis of left knee  POST-OPERATIVE DIAGNOSIS:  Primary osteoarthritis of left knee  PROCEDURE:  Procedures: ARTHROPLASTY, KNEE, TOTAL (Left) DePuy Attune  SURGEON:  Surgeons and Role:    DEWAINE Kay Kemps, MD - Primary  PHYSICIAN ASSISTANT:   ASSISTANTS: Debby KATHEE Fireman, PA-C   ANESTHESIA:   regional and spinal  EBL:  50 mL   BLOOD ADMINISTERED:none  DRAINS: none   LOCAL MEDICATIONS USED:  MARCAINE     SPECIMEN:  No Specimen  DISPOSITION OF SPECIMEN:  N/A  COUNTS:  YES  TOURNIQUET:   Total Tourniquet Time Documented: Thigh (Left) - 83 minutes Total: Thigh (Left) - 83 minutes   DICTATION: .Other Dictation: Dictation Number [id number]  PLAN OF CARE: Admit for overnight observation  PATIENT DISPOSITION:  PACU - hemodynamically stable.   Delay start of Pharmacological VTE agent (>24hrs) due to surgical blood loss or risk of bleeding: no

## 2024-01-26 NOTE — Discharge Instructions (Signed)
 Ice to the knee constantly.  Keep the incision covered and clean and dry for one week, then ok to remove the gel bandage and leave the incision open to air and also ok to get it wet.   Do exercise as instructed every hour, please to prevent stiffness.    DO NOT prop anything under the knee, it will make your knee stiff.  Prop under the ankle to encourage your knee to go straight.   Use the walker while you are up and around for balance.  Wear your support stockings 24/7 to prevent blood clots and take baby aspirin  twice daily for 30 days also to prevent blood clots  Follow up with Dr Kay in two weeks in the office, call [phone] for appt  Please call Dr Kay (cell) [phone] with any questions or concerns  INSTRUCTIONS AFTER JOINT REPLACEMENT   Remove items at home which could result in a fall. This includes throw rugs or furniture in walking pathways ICE to the affected joint every three hours while awake for 30 minutes at a time, for at least the first 3-5 days, and then as needed for pain and swelling.  Continue to use ice for pain and swelling. You may notice swelling that will progress down to the foot and ankle.  This is normal after surgery.  Elevate your leg when you are not up walking on it.   Continue to use the breathing machine you got in the hospital (incentive spirometer) which will help keep your temperature down.  It is common for your temperature to cycle up and down following surgery, especially at night when you are not up moving around and exerting yourself.  The breathing machine keeps your lungs expanded and your temperature down.   DIET:  As you were doing prior to hospitalization, we recommend a well-balanced diet.  DRESSING / WOUND CARE / SHOWERING  Keep the Aquacel bandage on for one week then ok to remove it and leave the incision open to air. OK to shower with the Aquacel in place(waterproof)  ACTIVITY  Increase activity slowly as tolerated, but  follow the weight bearing instructions below.   No driving for 6 weeks or until further direction given by your physician.  You cannot drive while taking narcotics.  No lifting or carrying greater than 10 lbs. until further directed by your surgeon. Avoid periods of inactivity such as sitting longer than an hour when not asleep. This helps prevent blood clots.  You may return to work once you are authorized by your doctor.     WEIGHT BEARING   Weight bearing as tolerated with assist device (walker, cane, etc) as directed, use it as long as suggested by your surgeon or therapist, typically at least 4-6 weeks.   EXERCISES  Results after joint replacement surgery are often greatly improved when you follow the exercise, range of motion and muscle strengthening exercises prescribed by your doctor. Safety measures are also important to protect the joint from further injury. Any time any of these exercises cause you to have increased pain or swelling, decrease what you are doing until you are comfortable again and then slowly increase them. If you have problems or questions, call your caregiver or physical therapist for advice.   Rehabilitation is important following a joint replacement. After just a few days of immobilization, the muscles of the leg can become weakened and shrink (atrophy).  These exercises are designed to build up the tone and strength of  the thigh and leg muscles and to improve motion. Often times heat used for twenty to thirty minutes before working out will loosen up your tissues and help with improving the range of motion but do not use heat for the first two weeks following surgery (sometimes heat can increase post-operative swelling).   These exercises can be done on a training (exercise) mat, on the floor, on a table or on a bed. Use whatever works the best and is most comfortable for you.    Use music or television while you are exercising so that the exercises are a pleasant  break in your day. This will make your life better with the exercises acting as a break in your routine that you can look forward to.   Perform all exercises about fifteen times, three times per day or as directed.  You should exercise both the operative leg and the other leg as well.  Exercises include:   Quad Sets - Tighten up the muscle on the front of the thigh (Quad) and hold for 5-10 seconds.   Straight Leg Raises - With your knee straight (if you were given a brace, keep it on), lift the leg to 60 degrees, hold for 3 seconds, and slowly lower the leg.  Perform this exercise against resistance later as your leg gets stronger.  Leg Slides: Lying on your back, slowly slide your foot toward your buttocks, bending your knee up off the floor (only go as far as is comfortable). Then slowly slide your foot back down until your leg is flat on the floor again.  Angel Wings: Lying on your back spread your legs to the side as far apart as you can without causing discomfort.  Hamstring Strength:  Lying on your back, push your heel against the floor with your leg straight by tightening up the muscles of your buttocks.  Repeat, but this time bend your knee to a comfortable angle, and push your heel against the floor.  You may put a pillow under the heel to make it more comfortable if necessary.   A rehabilitation program following joint replacement surgery can speed recovery and prevent re-injury in the future due to weakened muscles. Contact your doctor or a physical therapist for more information on knee rehabilitation.    CONSTIPATION  Constipation is defined medically as fewer than three stools per week and severe constipation as less than one stool per week.  Even if you have a regular bowel pattern at home, your normal regimen is likely to be disrupted due to multiple reasons following surgery.  Combination of anesthesia, postoperative narcotics, change in appetite and fluid intake all can affect your  bowels.   YOU MUST use at least one of the following options; they are listed in order of increasing strength to get the job done.  They are all available over the counter, and you may need to use some, POSSIBLY even all of these options:    Drink plenty of fluids (prune juice may be helpful) and high fiber foods Colace 100 mg by mouth twice a day  Senokot for constipation as directed and as needed Dulcolax (bisacodyl ), take with full glass of water   Miralax  (polyethylene glycol) once or twice a day as needed.  If you have tried all these things and are unable to have a bowel movement in the first 3-4 days after surgery call either your surgeon or your primary doctor.    If you experience loose stools or diarrhea, hold  the medications until you stool forms back up.  If your symptoms do not get better within 1 week or if they get worse, check with your doctor.  If you experience the worst abdominal pain ever or develop nausea or vomiting, please contact the office immediately for further recommendations for treatment.   ITCHING:  If you experience itching with your medications, try taking only a single pain pill, or even half a pain pill at a time.  You can also use Benadryl over the counter for itching or also to help with sleep.   TED HOSE STOCKINGS:  Use stockings on both legs until for at least 2 weeks or as directed by physician office. They may be removed at night for sleeping.  MEDICATIONS:  See your medication summary on the After Visit Summary that nursing will review with you.  You may have some home medications which will be placed on hold until you complete the course of blood thinner medication.  It is important for you to complete the blood thinner medication as prescribed.  PRECAUTIONS:  If you experience chest pain or shortness of breath - call 911 immediately for transfer to the hospital emergency department.   If you develop a fever greater that 101 F, purulent drainage  from wound, increased redness or drainage from wound, foul odor from the wound/dressing, or calf pain - CONTACT YOUR SURGEON.                                                   FOLLOW-UP APPOINTMENTS:  If you do not already have a post-op appointment, please call the office for an appointment to be seen by your surgeon.  Guidelines for how soon to be seen are listed in your After Visit Summary, but are typically between 1-4 weeks after surgery.  OTHER INSTRUCTIONS:   Knee Replacement:  Do not place pillow under knee, focus on keeping the knee straight while resting. CPM instructions: 0-90 degrees, 2 hours in the morning, 2 hours in the afternoon, and 2 hours in the evening. Place foam block, curve side up under heel at all times except when in CPM or when walking.  DO NOT modify, tear, cut, or change the foam block in any way.  POST-OPERATIVE OPIOID TAPER INSTRUCTIONS: It is important to wean off of your opioid medication as soon as possible. If you do not need pain medication after your surgery it is ok to stop day one. Opioids include: Codeine, Hydrocodone (Norco, Vicodin), Oxycodone (Percocet, oxycontin ) and hydromorphone  amongst others.  Long term and even short term use of opiods can cause: Increased pain response Dependence Constipation Depression Respiratory depression And more.  Withdrawal symptoms can include Flu like symptoms Nausea, vomiting And more Techniques to manage these symptoms Hydrate well Eat regular healthy meals Stay active Use relaxation techniques(deep breathing, meditating, yoga) Do Not substitute Alcohol to help with tapering If you have been on opioids for less than two weeks and do not have pain than it is ok to stop all together.  Plan to wean off of opioids This plan should start within one week post op of your joint replacement. Maintain the same interval or time between taking each dose and first decrease the dose.  Cut the total daily intake of  opioids by one tablet each day Next start to increase the time between doses. The  last dose that should be eliminated is the evening dose.   MAKE SURE YOU:  Understand these instructions.  Get help right away if you are not doing well or get worse.    Thank you for letting us  be a part of your medical care team.  It is a privilege we respect greatly.  We hope these instructions will help you stay on track for a fast and full recovery!

## 2024-01-26 NOTE — Interval H&P Note (Signed)
 History and Physical Interval Note:  01/26/2024 1:47 PM  Toni [name]  has presented today for surgery, with the diagnosis of Primary osteoarthritis of left knee.  The various methods of treatment have been discussed with the patient and family. After consideration of risks, benefits and other options for treatment, the patient has consented to  Procedures: ARTHROPLASTY, KNEE, TOTAL (Left) as a surgical intervention.  The patient's history has been reviewed, patient examined, no change in status, stable for surgery.  I have reviewed the patient's chart and labs.  Questions were answered to the patient's satisfaction.     [name]
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
## 2024-01-26 NOTE — Anesthesia Preprocedure Evaluation (Addendum)
"                                    Anesthesia Evaluation  Patient identified by MRN, date of birth, ID band Patient awake    Reviewed: Allergy & Precautions, NPO status , Patient's Chart, lab work & pertinent test results, reviewed documented beta blocker date and time   History of Anesthesia Complications (+) AWARENESS UNDER ANESTHESIA and history of anesthetic complications  Airway Mallampati: III  TM Distance: >3 FB     Dental no notable dental hx.    Pulmonary neg COPD, former smoker   breath sounds clear to auscultation       Cardiovascular hypertension, (-) CAD, (-) Past MI, (-) Cardiac Stents and (-) CABG  Rhythm:Regular Rate:Normal     Neuro/Psych  Headaches, neg Seizures PSYCHIATRIC DISORDERS Anxiety Depression     Neuromuscular disease    GI/Hepatic ,,,(+) Hepatitis -, C  Endo/Other  Hypothyroidism    Renal/GU      Musculoskeletal  (+) Arthritis , Osteoarthritis,    Abdominal   Peds  Hematology   Anesthesia Other Findings   Reproductive/Obstetrics                              Anesthesia Physical Anesthesia Plan  ASA: 2  Anesthesia Plan: Spinal and MAC   Post-op Pain Management: Regional block*   Induction:   PONV Risk Score and Plan: 2 and Ondansetron  and Propofol  infusion  Airway Management Planned: Natural Airway and Simple Face Mask  Additional Equipment:   Intra-op Plan:   Post-operative Plan:   Informed Consent: I have reviewed the patients History and Physical, chart, labs and discussed the procedure including the risks, benefits and alternatives for the proposed anesthesia with the patient or authorized representative who has indicated his/her understanding and acceptance.     Dental advisory given  Plan Discussed with: CRNA  Anesthesia Plan Comments:          Anesthesia Quick Evaluation  "

## 2024-01-26 NOTE — Anesthesia Procedure Notes (Signed)
 Anesthesia Regional Block: Adductor canal block   Pre-Anesthetic Checklist: , timeout performed,  Correct Patient, Correct Site, Correct Laterality,  Correct Procedure, Correct Position, site marked,  Risks and benefits discussed,  Surgical consent,  Pre-op evaluation,  At surgeon's request and post-op pain management  Laterality: Left  Prep: Maximum Sterile Barrier Precautions used, chloraprep       Needles:  Injection technique: Single-shot  Needle Type: Echogenic Needle      Needle Gauge: 20     Additional Needles:   Procedures:,,,, ultrasound used (permanent image in chart),,    Narrative:  Start time: 01/26/2024 1:40 PM End time: 01/26/2024 1:45 PM Injection made incrementally with aspirations every 5 mL.  Performed by: Personally  Anesthesiologist: Keneth Lynwood POUR, MD

## 2024-01-26 NOTE — Anesthesia Procedure Notes (Signed)
 Spinal  Patient location during procedure: OR Start time: 01/26/2024 2:25 PM End time: 01/26/2024 2:30 PM Reason for block: surgical anesthesia  Staffing Performed: anesthesiologist  Authorized by: Keneth Lynwood POUR, MD   Performed by: Keneth Lynwood POUR, MD  Preanesthetic Checklist Completed: patient identified, IV checked, site marked, risks and benefits discussed, surgical consent, monitors and equipment checked, pre-op evaluation and timeout performed Spinal Block Patient position: sitting Prep: DuraPrep Patient monitoring: heart rate, cardiac monitor, continuous pulse ox and blood pressure Approach: midline Location: L3-4 Injection technique: single-shot Needle Needle type: Sprotte  Needle gauge: 24 G Needle length: 9 cm Assessment Sensory level: T4 Events: CSF return

## 2024-01-26 NOTE — Transfer of Care (Signed)
 Immediate Anesthesia Transfer of Care Note  Patient: Toni [name] [facility]  Procedure(s) Performed: ARTHROPLASTY, KNEE, TOTAL (Left: Knee)  Patient Location: PACU  Anesthesia Type:Spinal  Level of Consciousness: awake  Airway & Oxygen Therapy: Patient Spontanous Breathing  Post-op Assessment: Report given to RN and Post -op Vital signs reviewed and stable  Post vital signs: Reviewed and stable  Last Vitals:  Vitals Value Taken Time  BP 133/68 01/26/24 16:15  Temp    Pulse 81 01/26/24 16:16  Resp 13 01/26/24 16:16  SpO2 93 % 01/26/24 16:16  Vitals shown include unfiled device data.  Last Pain:  Vitals:   01/26/24 1345  TempSrc:   PainSc: 0-No pain      Patients Stated Pain Goal: 6 (01/26/24 1226)  Complications: No notable events documented.

## 2024-01-26 NOTE — Plan of Care (Signed)
 " Problem: Education: Goal: Knowledge of General Education information will improve Description: Including pain rating scale, medication(s)/side effects and non-pharmacologic comfort measures 01/26/2024 2005 by Lenon Charmaine LABOR, RN Outcome: Progressing 01/26/2024 2005 by Lenon Charmaine LABOR, RN Outcome: Progressing   Problem: Health Behavior/Discharge Planning: Goal: Ability to manage health-related needs will improve 01/26/2024 2005 by Lenon Charmaine LABOR, RN Outcome: Progressing 01/26/2024 2005 by Lenon Charmaine LABOR, RN Outcome: Progressing   Problem: Clinical Measurements: Goal: Ability to maintain clinical measurements within normal limits will improve 01/26/2024 2005 by Lenon Charmaine LABOR, RN Outcome: Progressing 01/26/2024 2005 by Lenon Charmaine LABOR, RN Outcome: Progressing Goal: Will remain free from infection 01/26/2024 2005 by Lenon Charmaine LABOR, RN Outcome: Progressing 01/26/2024 2005 by Lenon Charmaine LABOR, RN Outcome: Progressing Goal: Diagnostic test results will improve 01/26/2024 2005 by Lenon Charmaine LABOR, RN Outcome: Progressing 01/26/2024 2005 by Lenon Charmaine LABOR, RN Outcome: Progressing Goal: Respiratory complications will improve 01/26/2024 2005 by Lenon Charmaine LABOR, RN Outcome: Progressing 01/26/2024 2005 by Lenon Charmaine LABOR, RN Outcome: Progressing Goal: Cardiovascular complication will be avoided 01/26/2024 2005 by Lenon Charmaine LABOR, RN Outcome: Progressing 01/26/2024 2005 by Lenon Charmaine LABOR, RN Outcome: Progressing   Problem: Activity: Goal: Risk for activity intolerance will decrease 01/26/2024 2005 by Lenon Charmaine LABOR, RN Outcome: Progressing 01/26/2024 2005 by Lenon Charmaine LABOR, RN Outcome: Progressing   Problem: Nutrition: Goal: Adequate nutrition will be maintained 01/26/2024 2005 by Lenon Charmaine LABOR, RN Outcome: Progressing 01/26/2024 2005 by Lenon Charmaine LABOR, RN Outcome: Progressing   Problem:  Coping: Goal: Level of anxiety will decrease 01/26/2024 2005 by Lenon Charmaine LABOR, RN Outcome: Progressing 01/26/2024 2005 by Lenon Charmaine LABOR, RN Outcome: Progressing   Problem: Elimination: Goal: Will not experience complications related to bowel motility 01/26/2024 2005 by Lenon Charmaine LABOR, RN Outcome: Progressing 01/26/2024 2005 by Lenon Charmaine LABOR, RN Outcome: Progressing Goal: Will not experience complications related to urinary retention 01/26/2024 2005 by Lenon Charmaine LABOR, RN Outcome: Progressing 01/26/2024 2005 by Lenon Charmaine LABOR, RN Outcome: Progressing   Problem: Pain Managment: Goal: General experience of comfort will improve and/or be controlled 01/26/2024 2005 by Lenon Charmaine LABOR, RN Outcome: Progressing 01/26/2024 2005 by Lenon Charmaine LABOR, RN Outcome: Progressing   Problem: Safety: Goal: Ability to remain free from injury will improve 01/26/2024 2005 by Lenon Charmaine LABOR, RN Outcome: Progressing 01/26/2024 2005 by Lenon Charmaine LABOR, RN Outcome: Progressing   Problem: Skin Integrity: Goal: Risk for impaired skin integrity will decrease 01/26/2024 2005 by Lenon Charmaine LABOR, RN Outcome: Progressing 01/26/2024 2005 by Lenon Charmaine LABOR, RN Outcome: Progressing   Problem: Education: Goal: Knowledge of the prescribed therapeutic regimen will improve 01/26/2024 2005 by Lenon Charmaine LABOR, RN Outcome: Progressing 01/26/2024 2005 by Lenon Charmaine LABOR, RN Outcome: Progressing   Problem: Bowel/Gastric: Goal: Gastrointestinal status for postoperative course will improve 01/26/2024 2005 by Lenon Charmaine LABOR, RN Outcome: Progressing 01/26/2024 2005 by Lenon Charmaine LABOR, RN Outcome: Progressing   Problem: Cardiac: Goal: Ability to maintain an adequate cardiac output 01/26/2024 2005 by Lenon Charmaine LABOR, RN Outcome: Progressing 01/26/2024 2005 by Lenon Charmaine LABOR, RN Outcome: Progressing Goal: Will show no evidence  of cardiac arrhythmias 01/26/2024 2005 by Lenon Charmaine LABOR, RN Outcome: Progressing 01/26/2024 2005 by Lenon Charmaine LABOR, RN Outcome: Progressing   Problem: Nutritional: Goal: Will attain and maintain optimal nutritional status 01/26/2024 2005 by Lenon Charmaine LABOR, RN Outcome: Progressing 01/26/2024 2005 by Lenon Charmaine LABOR, RN Outcome: Progressing   Problem: Neurological: Goal: Will regain or maintain usual level of  consciousness 01/26/2024 2005 by Lenon Charmaine LABOR, RN Outcome: Progressing 01/26/2024 2005 by Lenon Charmaine LABOR, RN Outcome: Progressing   Problem: Clinical Measurements: Goal: Ability to maintain clinical measurements within normal limits 01/26/2024 2005 by Lenon Charmaine LABOR, RN Outcome: Progressing 01/26/2024 2005 by Lenon Charmaine LABOR, RN Outcome: Progressing Goal: Postoperative complications will be avoided or minimized 01/26/2024 2005 by Lenon Charmaine LABOR, RN Outcome: Progressing 01/26/2024 2005 by Lenon Charmaine LABOR, RN Outcome: Progressing   Problem: Respiratory: Goal: Will regain and/or maintain adequate ventilation 01/26/2024 2005 by Lenon Charmaine LABOR, RN Outcome: Progressing 01/26/2024 2005 by Lenon Charmaine LABOR, RN Outcome: Progressing Goal: Respiratory status will improve 01/26/2024 2005 by Lenon Charmaine LABOR, RN Outcome: Progressing 01/26/2024 2005 by Lenon Charmaine LABOR, RN Outcome: Progressing   Problem: Skin Integrity: Goal: Demonstrates signs of wound healing without infection 01/26/2024 2005 by Lenon Charmaine LABOR, RN Outcome: Progressing 01/26/2024 2005 by Lenon Charmaine LABOR, RN Outcome: Progressing   Problem: Urinary Elimination: Goal: Will remain free from infection 01/26/2024 2005 by Lenon Charmaine LABOR, RN Outcome: Progressing 01/26/2024 2005 by Lenon Charmaine LABOR, RN Outcome: Progressing Goal: Ability to achieve and maintain adequate urine output 01/26/2024 2005 by Lenon Charmaine LABOR,  RN Outcome: Progressing 01/26/2024 2005 by Lenon Charmaine LABOR, RN Outcome: Progressing   Problem: Education: Goal: Knowledge of the prescribed therapeutic regimen will improve 01/26/2024 2005 by Lenon Charmaine LABOR, RN Outcome: Progressing 01/26/2024 2005 by Lenon Charmaine LABOR, RN Outcome: Progressing Goal: Individualized Educational Video(s) 01/26/2024 2005 by Lenon Charmaine LABOR, RN Outcome: Progressing 01/26/2024 2005 by Lenon Charmaine LABOR, RN Outcome: Progressing   Problem: Activity: Goal: Ability to avoid complications of mobility impairment will improve 01/26/2024 2005 by Lenon Charmaine LABOR, RN Outcome: Progressing 01/26/2024 2005 by Lenon Charmaine LABOR, RN Outcome: Progressing Goal: Range of joint motion will improve 01/26/2024 2005 by Lenon Charmaine LABOR, RN Outcome: Progressing 01/26/2024 2005 by Lenon Charmaine LABOR, RN Outcome: Progressing   Problem: Clinical Measurements: Goal: Postoperative complications will be avoided or minimized 01/26/2024 2005 by Lenon Charmaine LABOR, RN Outcome: Progressing 01/26/2024 2005 by Lenon Charmaine LABOR, RN Outcome: Progressing   Problem: Pain Management: Goal: Pain level will decrease with appropriate interventions 01/26/2024 2005 by Lenon Charmaine LABOR, RN Outcome: Progressing 01/26/2024 2005 by Lenon Charmaine LABOR, RN Outcome: Progressing   Problem: Skin Integrity: Goal: Will show signs of wound healing 01/26/2024 2005 by Lenon Charmaine LABOR, RN Outcome: Progressing 01/26/2024 2005 by Lenon Charmaine LABOR, RN Outcome: Progressing   "

## 2024-01-26 NOTE — Progress Notes (Signed)
 Orthopedic Tech Progress Note Patient Details:  Toni [name] [facility] 04/21/1955 [id number] CPM will be removed at 2030 CPM Left Knee CPM Left Knee: On Left Knee Flexion (Degrees): 90 Left Knee Extension (Degrees): 0  Post Interventions Patient Tolerated: Well Ortho Devices Type of Ortho Device: Bone foam zero knee Ortho Device/Splint Location: LLE Ortho Device/Splint Interventions: Application   Post Interventions Patient Tolerated: Well  Massie BRAVO Makensie Mulhall 01/26/2024, 4:58 PM

## 2024-01-27 ENCOUNTER — Other Ambulatory Visit (HOSPITAL_COMMUNITY): Payer: Self-pay

## 2024-01-27 LAB — CBC
HCT: 39.2 % (ref 36.0–46.0)
Hemoglobin: 12.4 g/dL (ref 12.0–15.0)
MCH: 28.9 pg (ref 26.0–34.0)
MCHC: 31.6 g/dL (ref 30.0–36.0)
MCV: 91.4 fL (ref 80.0–100.0)
Platelets: 215 10*3/uL (ref 150–400)
RBC: 4.29 MIL/uL (ref 3.87–5.11)
RDW: 12.5 % (ref 11.5–15.5)
WBC: 11.3 10*3/uL — ABNORMAL HIGH (ref 4.0–10.5)
nRBC: 0 % (ref 0.0–0.2)

## 2024-01-27 LAB — BASIC METABOLIC PANEL WITH GFR
Anion gap: 10 (ref 5–15)
BUN: 20 mg/dL (ref 8–23)
CO2: 26 mmol/L (ref 22–32)
Calcium: 9.1 mg/dL (ref 8.9–10.3)
Chloride: 102 mmol/L (ref 98–111)
Creatinine, Ser: 0.83 mg/dL (ref 0.44–1.00)
GFR, Estimated: >60 mL/min
Glucose, Bld: 171 mg/dL — ABNORMAL HIGH (ref 70–99)
Potassium: 4.4 mmol/L (ref 3.5–5.1)
Sodium: 138 mmol/L (ref 135–145)

## 2024-01-27 MED ORDER — DESVENLAFAXINE SUCCINATE ER 50 MG PO TB24
50.0000 mg | ORAL_TABLET | Freq: Every day | ORAL | 0 refills | Status: AC
  Filled 2024-01-27: qty 30, 30d supply, fill #0

## 2024-01-27 MED ORDER — DESVENLAFAXINE SUCCINATE ER 50 MG PO TB24
50.0000 mg | ORAL_TABLET | Freq: Every day | ORAL | Status: DC
  Administered 2024-01-27 – 2024-01-30 (×4): 50 mg via ORAL
  Filled 2024-01-27 (×4): qty 1

## 2024-01-27 NOTE — Evaluation (Signed)
 Physical Therapy Evaluation Patient Details Name: Toni [name] MRN: [id number] DOB: 1955-01-15 Today's Date: 01/27/2024  History of Present Illness  Pt is a 69 year old female s/p L TKA on 01/26/24.  PMH of hepatitis C, depression, migraines, cervicalgia, and 5th metatarsal fx, left sup/inf pubic rami fxs with adductor strain  Clinical Impression  Pt is s/p TKA resulting in the deficits listed below (see PT Problem List).  Pt will benefit from acute skilled PT to increase their independence and safety with mobility to allow discharge.  Pt very anxious and crying discussing d/c plans on arrival to room.  Pt does not have any assist available over the weekend as she scheduled her help to start on Monday.  Due to significant anxiety, no assist available at home, and pain, pt will likely need to stay the weekend (as per MD note today).  Pt does attempt to perform tasks as requested however anxiety and pain limiting.  Pt was agreeable to remain OOB end of session and ice packs applied to left knee.         If plan is discharge home, recommend the following: Assistance with cooking/housework;Assist for transportation;Help with stairs or ramp for entrance   Can travel by private vehicle        Equipment Recommendations None recommended by PT  Recommendations for Other Services       Functional Status Assessment Patient has had a recent decline in their functional status and demonstrates the ability to make significant improvements in function in a reasonable and predictable amount of time.     Precautions / Restrictions Precautions Precautions: Fall;Knee Restrictions LLE Weight Bearing Per Provider Order: Weight bearing as tolerated      Mobility  Bed Mobility Overal bed mobility: Needs Assistance Bed Mobility: Supine to Sit     Supine to sit: Supervision, HOB elevated     General bed mobility comments: pt maintained Left knee extension to assist with pain control     Transfers Overall transfer level: Needs assistance Equipment used: Rolling walker (2 wheels) Transfers: Sit to/from Stand Sit to Stand: Min assist           General transfer comment: verbal cues for UE and LE positioning for pain control, assist to control descent    Ambulation/Gait Ambulation/Gait assistance: Contact guard assist Gait Distance (Feet): 30 Feet Assistive device: Rolling walker (2 wheels) Gait Pattern/deviations: Step-to pattern, Decreased stance time - left, Antalgic Gait velocity: decr     General Gait Details: max verbal cues for sequence, RW positioning, step length; pt self cued for posture; distance limited by 10/10 left knee pain  Stairs            Wheelchair Mobility     Tilt Bed    Modified Rankin (Stroke Patients Only)       Balance                                             Pertinent Vitals/Pain Pain Assessment Pain Assessment: 0-10 Pain Score: 10-Worst pain ever Pain Location: left knee Pain Descriptors / Indicators: Sore, Aching, Tightness Pain Intervention(s): Ice applied, Monitored during session, Premedicated before session, Repositioned    Home Living Family/patient expects to be discharged to:: Private residence Living Arrangements: Alone Available Help at Discharge: Personal care attendant (pt hired help at home however they were not supposed to start until  Monday) Type of Home: House Home Access: Level entry       Home Layout: Able to live on main level with bedroom/bathroom Home Equipment: Rolling Walker (2 wheels)      Prior Function Prior Level of Function : Independent/Modified Independent                     Extremity/Trunk Assessment        Lower Extremity Assessment Lower Extremity Assessment: LLE deficits/detail LLE Deficits / Details: able to perform SLR, observed 0* knee extension, knee flexion not tested due to significant 10/10 pain reported       Communication    Communication Communication: No apparent difficulties    Cognition Arousal: Alert Behavior During Therapy: Anxious                             Following commands: Intact       Cueing       General Comments      Exercises     Assessment/Plan    PT Assessment Patient needs continued PT services  PT Problem List Decreased strength;Decreased mobility;Decreased range of motion;Decreased activity tolerance;Decreased balance;Pain       PT Treatment Interventions Stair training;Gait training;Therapeutic exercise;DME instruction;Balance training;Functional mobility training;Patient/family education;Therapeutic activities    PT Goals (Current goals can be found in the Care Plan section)  Acute Rehab PT Goals PT Goal Formulation: With patient Time For Goal Achievement: 02/10/24 Potential to Achieve Goals: Good    Frequency 7X/week     Co-evaluation               AM-PAC PT 6 Clicks Mobility  Outcome Measure Help needed turning from your back to your side while in a flat bed without using bedrails?: A Little Help needed moving from lying on your back to sitting on the side of a flat bed without using bedrails?: A Little Help needed moving to and from a bed to a chair (including a wheelchair)?: A Little Help needed standing up from a chair using your arms (e.g., wheelchair or bedside chair)?: A Little Help needed to walk in hospital room?: A Little Help needed climbing 3-5 steps with a railing? : A Lot 6 Click Score: 17    End of Session Equipment Utilized During Treatment: Gait belt Activity Tolerance: Patient tolerated treatment well Patient left: in chair;with call bell/phone within reach;with chair alarm set   PT Visit Diagnosis: Difficulty in walking, not elsewhere classified (R26.2)    Time: 8987-8968 PT Time Calculation (min) (ACUTE ONLY): 19 min   Charges:   PT Evaluation $PT Eval Low Complexity: 1 Low   PT General Charges $$ ACUTE  PT VISIT: 1 Visit        Tari PT, DPT Physical Therapist Acute Rehabilitation Services Office: [phone]   Kati L Payson 01/27/2024, 11:12 AM

## 2024-01-27 NOTE — Progress Notes (Signed)
 Orthopedics Progress Note  Subjective: Patient doing well this AM. Pain controlled with meds. She has been up the the BR  Objective:  Vitals:   01/27/24 0100 01/27/24 0620  BP: 135/76 (!) 142/77  Pulse: 78 72  Resp: 16 17  Temp: 97.7 F (36.5 C) (!) 97.5 F (36.4 C)  SpO2: 96% 97%    General: Awake and alert  Musculoskeletal: Left knee incision CDI, dressing changed to Aquacel, TED applied. No pain with active quad set and ankle pumps Neurovascularly intact  Lab Results  Component Value Date   WBC 11.3 (H) 01/27/2024   HGB 12.4 01/27/2024   HCT 39.2 01/27/2024   MCV 91.4 01/27/2024   PLT 215 01/27/2024       Component Value Date/Time   NA 138 01/27/2024 0307   NA 144 04/22/2019 1200   K 4.4 01/27/2024 0307   CL 102 01/27/2024 0307   CO2 26 01/27/2024 0307   GLUCOSE 171 (H) 01/27/2024 0307   BUN 20 01/27/2024 0307   BUN 33 (H) 04/22/2019 1200   CREATININE 0.83 01/27/2024 0307   CREATININE 0.97 03/21/2022 1232   CALCIUM  9.1 01/27/2024 0307   GFRNONAA >60 01/27/2024 0307   GFRNONAA >60 03/21/2022 1232   GFRAA 76 04/22/2019 1200    Lab Results  Component Value Date   INR 1.0 03/21/2022   INR 1.0 01/19/2022    Assessment/Plan: POD #1 s/p Procedures: ARTHROPLASTY, KNEE, TOTAL Stable overnight. Up with therapy. Pain and confidence a big issue with this patient, doubt discharge unless she does VERY well with therapy and her home care/assistance plan is in place. Please call if this is the case.  DVT prophylaxis with BID 81 mg aspirin  and TED hose and activity   Elspeth SAUNDERS. Kay, MD 01/27/2024 8:31 AM

## 2024-01-27 NOTE — Op Note (Signed)
 Toni [name], Toni [name] MEDICAL RECORD NO: [id number] ACCOUNT NO: [id number] DATE OF BIRTH: 1955/01/27 FACILITY: [facility] LOCATION: [facility] PHYSICIAN: Elspeth SAUNDERS. Kay, MD  Operative Report   DATE OF PROCEDURE: 01/26/2024  DATE OF PROCEDURE:  01/26/2024  PREOPERATIVE DIAGNOSIS:  Left knee end-stage osteoarthritis.  POSTOPERATIVE DIAGNOSIS:  Left knee end-stage osteoarthritis.  PROCEDURE PERFORMED:  Left total knee arthroplasty using the DePuy Attune prosthesis.  ATTENDING SURGEON:  Elspeth SAUNDERS. Kay, MD  ASSISTANT:  Debby Crock [place], [place], who was scrubbed during the entire procedure, and necessary for satisfactory completion of surgery.  ANESTHESIA:  Spinal anesthesia plus adductor canal block was utilized.  ESTIMATED BLOOD LOSS:  50 mL.  FLUID REPLACEMENT:  1000 mL crystalloid.  COUNTS:  Instrument count was correct.  COMPLICATIONS:  There were no complications.  ANTIBIOTICS:  Perioperative antibiotics were given.  INDICATIONS:  The patient is a 69 year old female with worsening left knee pain due to end-stage arthritis.  The patient has had progressive pain despite conservative management.  The patient is limited functionally and has breakthrough pain despite  being on narcotic pain medications.  Due to a failure of conservative management, the patient has elected to proceed with total knee arthroplasty to eliminate pain and restore function.  Informed consent obtained.  DESCRIPTION OF PROCEDURE:  After an adequate level of anesthesia was achieved, the patient was positioned in the supine position.  Left leg correctly identified.  A nonsterile tourniquet was placed to the proximal thigh.  Sterile prep and drape of the  left leg was performed.  A timeout was called verifying correct patient and correct site.  We elevated the leg and exsanguinated with an Esmarch bandage, inflating the tourniquet to 300 or 275 mmHg.  We then placed the knee in flexion and performed a   longitudinal midline incision with a 10-blade scalpel.  We used a fresh 10-blade scalpel for the medial parapatellar arthrotomy.  We then divided the lateral patellofemoral ligaments.  We everted the patella, exposing the distal femur which had advanced  cartilage loss.  We entered the distal femur with a step cut drill.  We then irrigated the canal prior to inserting the intramedullary guide and resected 9 mm off the distal femur set on 5 degrees of valgus.  Once we had our distal cut done, we did our  sizing.  We sized the femur to a size 6 anterior down, performing anterior, posterior, and chamfer cuts with a 4-in-1 block.  We then removed ACL, PCL, and meniscal tissues, subluxing the tibia anteriorly and performing our tibial cut 90 degrees  perpendicular to the long axis of the tibia with minimal posterior slope for this posterior cruciate substituting prosthesis.  We did resect only 2 mm off the affected medial side.  Once we had our tibial cut done, we used the laminar spreader to remove  remove posterior femoral condyle osteophytes.  Once we had those osteophytes removed, we went ahead and injected the posterior capsule with a combination of Marcaine , Exparel , and saline.  Next, we checked our gaps which were symmetric at 6 mm.  We felt  like we could probably get an 8 in both, but it was symmetric.  We removed our pins from our tibia and completed our tibia preparation with a modular drill and keel punch.  Once we did that, we went to the femur and cut the box for the 6 left femur.  We  then trialled with a 6 mm and then an 8 mm poly trial in  place.  We felt like we could definitely get the 10 in, so we were going to cement with the size 6, 10 mm poly.  With the knee in extension, we went ahead and resurfaced the patella, going from a  24 mm thickness down to a 15 mm thickness.  We cut using the patellar cutting guide and were able to get a perpendicular cut with 15 mm remaining.  We drilled lug  holes for the 35 patellar button.  We then placed the patellar button trial in place and  ranged the knee and had excellent patellar tracking with no touch technique.  We removed all trial components, irrigated, pulse irrigated the bone, and then dried the bone well.  We vacuum mixed high-viscosity cement on the back table and cemented the  components into place.  We had a size 5 tibia, a size 6 narrow left femur, and then a 35 patellar button.  We placed a size 6, 10 mm poly in the knee for cementing and placing the knee in extension for good compression of cement and used a patellar  compression clamp to keep the patella compressed during the cement setting stage.  Once all cement was hard on the back table, we removed excess cement with a 1/4-inch curved osteotome and inspected the entirety of the knee.  We irrigated thoroughly,  removed the size 6, 10 mm poly, and went with the size 6, 12 mm poly as the real polyethylene.  Once we had inspected a final time for the cement, we placed the poly on the tibial tray and reduced the knee.  We had a nice medial pop as that medial side  reduced.  Excellent flexion and extension stability throughout a full arc of motion.  Normal patellar tracking.  We injected the anterior capsule with a combination of Marcaine , Exparel , and saline.  We irrigated thoroughly.  We then closed the medial  parapatellar arthrotomy with #1 Vicryl suture followed by 0 and 2-0 layered subcutaneous closure and 4-0 Monocryl for skin.  Steri-Strips were applied, followed by sterile dressing.  The patient tolerated the surgery well.     D: 01/26/2024 4:09:54 pm T: 01/26/2024 4:12:00 pm  JOB: [id number]/ [id number]

## 2024-01-27 NOTE — Progress Notes (Signed)
 Discharge meds in a secure bag delivered to inpatient pharmacy by this RN.  Desvenlafaxine  picked up form [employer] by this RN in a separate secure bag- will be dispensed as a home med by inpatient pharmacy. Tis RN spoke with inpatient pharmacy and patient to update on plan to dispense home med

## 2024-01-27 NOTE — Progress Notes (Signed)
 Patient requesting her normal anti-depressant medication. Received this message from nursing  We do not carry one of this patient's home medications and she is *very* anxious about it. She does not have anyone that can pick up her home bottle and she says she can't tolerate our interchange. The outpatient pharmacy has it, but we would have to put in an outpatient prescription, pick it up, and treat it as a home med. Would you be willing to put in a prescription for her Desvenlaxafine (Pristiq ) to get her through her days here?   I prescribed Pristiq  50 mg sustained release to be taken once daily which was on her admission med reconciliation. This Rx was sent to the [facility] outpatient pharmacy. Spoke with RN on the ward to make sure she was aware. Please contact me with any questions and ask for pharmacy assistance as well.   Dr Marcey Her [facility]

## 2024-01-27 NOTE — Progress Notes (Signed)
 Physical Therapy Treatment Patient Details Name: Toni [name] MRN: [id number] DOB: 07/25/1955 Today's Date: 01/27/2024   History of Present Illness Pt is a 69 year old female s/p L TKA on 01/26/24.  PMH of hepatitis C, depression, migraines, cervicalgia, and 5th metatarsal fx, left sup/inf pubic rami fxs with adductor strain    PT Comments  Pt in recliner on arrival and reports anxiety.  Pt also was about to ask/take IV pain meds and another staff member came in to speak with her.  Pt now requesting to go to bathroom.  Pt again reports 10/10 pain so limited to bathroom ambulation only.  Pt requested IV pain meds upon assist back to bed.    If plan is discharge home, recommend the following: Assistance with cooking/housework;Assist for transportation;Help with stairs or ramp for entrance   Can travel by private vehicle        Equipment Recommendations  None recommended by PT    Recommendations for Other Services       Precautions / Restrictions Precautions Precautions: Fall;Knee Restrictions LLE Weight Bearing Per Provider Order: Weight bearing as tolerated     Mobility  Bed Mobility Overal bed mobility: Needs Assistance Bed Mobility: Sit to Supine     Supine to sit: Supervision, HOB elevated Sit to supine: Supervision, HOB elevated   General bed mobility comments: pt maintained Left knee extension to assist with pain control; pt utilized gait belt for self assist    Transfers Overall transfer level: Needs assistance Equipment used: Rolling walker (2 wheels) Transfers: Sit to/from Stand Sit to Stand: Contact guard assist, Min assist           General transfer comment: verbal cues for UE and LE positioning for pain control, assist to control descent    Ambulation/Gait Ambulation/Gait assistance: Contact guard assist Gait Distance (Feet): 16 Feet (total) Assistive device: Rolling walker (2 wheels) Gait Pattern/deviations: Step-to pattern, Decreased  stance time - left, Antalgic Gait velocity: decr     General Gait Details: max verbal cues for sequence, RW positioning, step length; distance limited to ambulating to/from bathroom only due to 10/10 left knee pain and pt requesting IV pain meds   Stairs             Wheelchair Mobility     Tilt Bed    Modified Rankin (Stroke Patients Only)       Balance                                            Communication Communication Communication: No apparent difficulties  Cognition Arousal: Alert Behavior During Therapy: Anxious                             Following commands: Intact      Cueing    Exercises      General Comments        Pertinent Vitals/Pain Pain Assessment Pain Assessment: 0-10 Pain Score: 10-Worst pain ever Pain Location: left knee Pain Descriptors / Indicators: Sore, Aching, Tightness Pain Intervention(s): Patient requesting pain meds-RN notified, Repositioned, Monitored during session    Home Living                          Prior Function  PT Goals (current goals can now be found in the care plan section) Progress towards PT goals: Progressing toward goals    Frequency    7X/week      PT Plan      Co-evaluation              AM-PAC PT 6 Clicks Mobility   Outcome Measure  Help needed turning from your back to your side while in a flat bed without using bedrails?: A Little Help needed moving from lying on your back to sitting on the side of a flat bed without using bedrails?: A Little Help needed moving to and from a bed to a chair (including a wheelchair)?: A Little Help needed standing up from a chair using your arms (e.g., wheelchair or bedside chair)?: A Little Help needed to walk in hospital room?: A Little Help needed climbing 3-5 steps with a railing? : A Lot 6 Click Score: 17    End of Session Equipment Utilized During Treatment: Gait belt Activity  Tolerance: Patient limited by pain Patient left: with call bell/phone within reach;in bed;with bed alarm set Nurse Communication: Patient requests pain meds PT Visit Diagnosis: Difficulty in walking, not elsewhere classified (R26.2)     Time: 1400-1420 PT Time Calculation (min) (ACUTE ONLY): 20 min  Charges:    $Gait Training: 8-22 mins PT General Charges $$ ACUTE PT VISIT: 1 Visit                    Tari KLEIN, DPT Physical Therapist Acute Rehabilitation Services Office: [phone]    Tari LITTIE Farm 01/27/2024, 4:33 PM

## 2024-01-27 NOTE — Care Management Obs Status (Signed)
 MEDICARE OBSERVATION STATUS NOTIFICATION   Patient Details  Name: Alaa Mullally MRN: [id number] Date of Birth: Sep 04, 1955   Medicare Observation Status Notification Given:  Yes    Sonda Manuella Quill, RN 01/27/2024, 3:30 PM

## 2024-01-28 DIAGNOSIS — F32A Depression, unspecified: Secondary | ICD-10-CM | POA: Diagnosis present

## 2024-01-28 DIAGNOSIS — Z87891 Personal history of nicotine dependence: Secondary | ICD-10-CM | POA: Diagnosis not present

## 2024-01-28 DIAGNOSIS — Z8619 Personal history of other infectious and parasitic diseases: Secondary | ICD-10-CM | POA: Diagnosis not present

## 2024-01-28 DIAGNOSIS — K5909 Other constipation: Secondary | ICD-10-CM | POA: Diagnosis not present

## 2024-01-28 DIAGNOSIS — I10 Essential (primary) hypertension: Secondary | ICD-10-CM | POA: Diagnosis present

## 2024-01-28 DIAGNOSIS — M1712 Unilateral primary osteoarthritis, left knee: Secondary | ICD-10-CM | POA: Diagnosis present

## 2024-01-28 DIAGNOSIS — Z79899 Other long term (current) drug therapy: Secondary | ICD-10-CM | POA: Diagnosis not present

## 2024-01-28 DIAGNOSIS — Z885 Allergy status to narcotic agent status: Secondary | ICD-10-CM | POA: Diagnosis not present

## 2024-01-28 DIAGNOSIS — Z79890 Hormone replacement therapy (postmenopausal): Secondary | ICD-10-CM | POA: Diagnosis not present

## 2024-01-28 DIAGNOSIS — Z6835 Body mass index (BMI) 35.0-35.9, adult: Secondary | ICD-10-CM | POA: Diagnosis not present

## 2024-01-28 DIAGNOSIS — E039 Hypothyroidism, unspecified: Secondary | ICD-10-CM | POA: Diagnosis present

## 2024-01-28 DIAGNOSIS — Z888 Allergy status to other drugs, medicaments and biological substances status: Secondary | ICD-10-CM | POA: Diagnosis not present

## 2024-01-28 DIAGNOSIS — F4312 Post-traumatic stress disorder, chronic: Secondary | ICD-10-CM | POA: Diagnosis present

## 2024-01-28 LAB — CBC
HCT: 36.5 % (ref 36.0–46.0)
Hemoglobin: 11.4 g/dL — ABNORMAL LOW (ref 12.0–15.0)
MCH: 28.7 pg (ref 26.0–34.0)
MCHC: 31.2 g/dL (ref 30.0–36.0)
MCV: 91.9 fL (ref 80.0–100.0)
Platelets: 211 10*3/uL (ref 150–400)
RBC: 3.97 MIL/uL (ref 3.87–5.11)
RDW: 12.5 % (ref 11.5–15.5)
WBC: 9.5 10*3/uL (ref 4.0–10.5)
nRBC: 0 % (ref 0.0–0.2)

## 2024-01-28 NOTE — Progress Notes (Signed)
" ° ° °  Subjective:  Patient reports pain as moderate to severe.  Denies N/V/CP/SOB. Slow progress with PT.  Objective:   VITALS:   Vitals:   01/27/24 1005 01/27/24 1955 01/28/24 0538 01/28/24 0755  BP: (!) 143/79 (!) 141/75 137/73 (!) 146/77  Pulse: 80 93 (!) 105 95  Resp: 18 20 18 20   Temp: (!) 97.5 F (36.4 C) 98.2 F (36.8 C) 98.9 F (37.2 C) 98.3 F (36.8 C)  TempSrc: Oral Oral Oral Oral  SpO2: 100% 97% 95% 93%  Weight:      Height:        NAD ABD soft Sensation intact distally Intact pulses distally Dorsiflexion/Plantar flexion intact Incision: scant drainage Compartment soft   Lab Results  Component Value Date   WBC 9.5 01/28/2024   HGB 11.4 (L) 01/28/2024   HCT 36.5 01/28/2024   MCV 91.9 01/28/2024   PLT 211 01/28/2024   BMET    Component Value Date/Time   NA 138 01/27/2024 0307   NA 144 04/22/2019 1200   K 4.4 01/27/2024 0307   CL 102 01/27/2024 0307   CO2 26 01/27/2024 0307   GLUCOSE 171 (H) 01/27/2024 0307   BUN 20 01/27/2024 0307   BUN 33 (H) 04/22/2019 1200   CREATININE 0.83 01/27/2024 0307   CREATININE 0.97 03/21/2022 1232   CALCIUM  9.1 01/27/2024 0307   GFRNONAA >60 01/27/2024 0307   GFRNONAA >60 03/21/2022 1232     Assessment/Plan: 2 Days Post-Op   Principal Problem:   Status post total knee replacement, left   WBAT with walker DVT ppx: Aspirin , SCDs, TEDS PO pain control PT: only ambulated 16 ft Dispo: D/c after clears PT and help at home confirmed   Redell JINNY Shoals 01/28/2024, 8:37 AM   Redell Shoals, MD [phone] [facility] is now [facility]  [place] [address], [place], [place] [address] Phone: [phone] [email or web address] Facebook  [employer]      "

## 2024-01-28 NOTE — Progress Notes (Signed)
 Physical Therapy Treatment Patient Details Name: Toni [name] MRN: [id number] DOB: 1955/03/20 Today's Date: 01/28/2024   History of Present Illness Pt is a 69 year old female s/p L TKA on 01/26/24.  PMH of hepatitis C, depression, migraines, cervicalgia, and 5th metatarsal fx, left sup/inf pubic rami fxs with adductor strain    PT Comments  Pt not wanting to do any PT today stating that she is in too much pain and that yesterday was too much. I educated and encouraged that her pain will vary from day to day and it is still important to gentle move the operative LE to decrease swelling which will decrease pain. Pt is not wanting ice stating it is too cold, as much as I suggested ti really helps with pain control.  She was able to show me a quad set with full knee extension , however noted swelling present. Pt would not let me perform gentle knee flexion during this session, and only would perform ambulation to and from the bathroom. Pt with shakes from pain at this time.  Will try to return today for ROM exercises time. May need more assistance at home or post acute if she doesn't progress more.    If plan is discharge home, recommend the following: Assistance with cooking/housework;Assist for transportation;Help with stairs or ramp for entrance   Can travel by private vehicle        Equipment Recommendations  None recommended by PT    Recommendations for Other Services       Precautions / Restrictions Precautions Precautions: Fall;Knee Restrictions Weight Bearing Restrictions Per Provider Order: No LLE Weight Bearing Per Provider Order: Weight bearing as tolerated     Mobility  Bed Mobility Overal bed mobility: Needs Assistance Bed Mobility: Sit to Supine       Sit to supine: HOB elevated, Min assist   General bed mobility comments: pt maintained Left knee extension to assist with pain control; assited LLE for pain control mainly    Transfers Overall transfer  level: Needs assistance Equipment used: Rolling walker (2 wheels) Transfers: Sit to/from Stand Sit to Stand: Contact guard assist, Min assist           General transfer comment: verbal cues for UE and LE positioning for pain control, assist to control descent    Ambulation/Gait Ambulation/Gait assistance: Contact guard assist Gait Distance (Feet): 10 Feet (to bathroom and back ( pt refused any further at this time )) Assistive device: Rolling walker (2 wheels) Gait Pattern/deviations: Step-to pattern, Decreased stance time - left, Antalgic Gait velocity: decr     General Gait Details: Pt with safe RW today, however going back to bed pt was very slow due to pain   Stairs             Wheelchair Mobility     Tilt Bed    Modified Rankin (Stroke Patients Only)       Balance                                            Communication Communication Communication: No apparent difficulties  Cognition Arousal: Alert Behavior During Therapy: Anxious                             Following commands: Intact      Cueing  Exercises      General Comments        Pertinent Vitals/Pain Pain Assessment Pain Score: 10-Worst pain ever Pain Location: left knee Pain Descriptors / Indicators: Sore, Aching, Tightness Pain Intervention(s): Monitored during session, Premedicated before session, Repositioned (pt refused ice)    Home Living                          Prior Function            PT Goals (current goals can now be found in the care plan section) Acute Rehab PT Goals PT Goal Formulation: With patient Time For Goal Achievement: 02/10/24 Progress towards PT goals: Progressing toward goals (slow progression)    Frequency    7X/week      PT Plan      Co-evaluation              AM-PAC PT 6 Clicks Mobility   Outcome Measure  Help needed turning from your back to your side while in a flat bed without  using bedrails?: A Little Help needed moving from lying on your back to sitting on the side of a flat bed without using bedrails?: A Little Help needed moving to and from a bed to a chair (including a wheelchair)?: A Little Help needed standing up from a chair using your arms (e.g., wheelchair or bedside chair)?: A Little Help needed to walk in hospital room?: A Lot Help needed climbing 3-5 steps with a railing? : A Lot 6 Click Score: 16    End of Session Equipment Utilized During Treatment: Gait belt Activity Tolerance: Patient limited by pain Patient left: with call bell/phone within reach;in bed;with bed alarm set Nurse Communication: Patient requests pain meds PT Visit Diagnosis: Difficulty in walking, not elsewhere classified (R26.2)     Time: 8699-8681 PT Time Calculation (min) (ACUTE ONLY): 18 min  Charges:    $Gait Training: 8-22 mins PT General Charges $$ ACUTE PT VISIT: 1 Visit                     Paylin Hailu, PT, MPT Acute Rehabilitation Services Office: [phone] If a weekend: secure chat groups: [facility] PT, [facility] OT, [facility] SLP 01/28/2024    Clancey Welton 01/28/2024, 3:01 PM

## 2024-01-28 NOTE — Progress Notes (Signed)
 Physical Therapy Treatment Patient Details Name: Toni [name] MRN: [id number] DOB: May 11, 1955 Today's Date: 01/28/2024   History of Present Illness Pt is a 69 year old female s/p L TKA on 01/26/24.  PMH of hepatitis C, depression, migraines, cervicalgia, and 5th metatarsal fx, left sup/inf pubic rami fxs with adductor strain    PT Comments  Pt has been to and from bathroom with nursing staff, pt states it is getting a little better, but still pain is a lot and her stomach is crapping so much from constipation. Was able to perform L TKA exercises and seemed to tolerate movement better. She was very kind stating she is trying but having so much pain. Provided 2 warm prune juices. Will continue to follow, making some progress but very slow.     If plan is discharge home, recommend the following: Assistance with cooking/housework;Assist for transportation;Help with stairs or ramp for entrance   Can travel by private vehicle        Equipment Recommendations  None recommended by PT    Recommendations for Other Services       Precautions / Restrictions Precautions Precautions: Fall;Knee Restrictions Weight Bearing Restrictions Per Provider Order: No LLE Weight Bearing Per Provider Order: Weight bearing as tolerated     Mobility  Bed Mobility Overal bed mobility: Needs Assistance Bed Mobility: Sit to Supine       Sit to supine: HOB elevated, Min assist   General bed mobility comments: pt maintained Left knee extension to assist with pain control; assited LLE for pain control mainly    Transfers Overall transfer level: Needs assistance Equipment used: Rolling walker (2 wheels) Transfers: Sit to/from Stand Sit to Stand: Contact guard assist, Min assist           General transfer comment: verbal cues for UE and LE positioning for pain control, assist to control descent    Ambulation/Gait Ambulation/Gait assistance: Contact guard assist Gait Distance (Feet):  10 Feet (to bathroom and back ( pt refused any further at this time )) Assistive device: Rolling walker (2 wheels) Gait Pattern/deviations: Step-to pattern, Decreased stance time - left, Antalgic Gait velocity: decr     General Gait Details: Pt with safe RW today, however going back to bed pt was very slow due to pain   Stairs             Wheelchair Mobility     Tilt Bed    Modified Rankin (Stroke Patients Only)       Balance                                            Communication Communication Communication: No apparent difficulties  Cognition Arousal: Alert Behavior During Therapy: Anxious                             Following commands: Intact      Cueing    Exercises Total Joint Exercises Ankle Circles/Pumps: AROM, 10 reps, Supine, Both Quad Sets: AROM, Left, 10 reps, Supine Heel Slides: AAROM, Supine, 10 reps, Left Hip ABduction/ADduction: AAROM, Supine, Left, 10 reps Straight Leg Raises: AAROM, Supine, Left, 10 reps Goniometric ROM: 0-60 supine    General Comments        Pertinent Vitals/Pain Pain Assessment Pain Score: 8  (especially with movement) Pain Location: left knee Pain  Descriptors / Indicators: Sore, Aching, Tightness Pain Intervention(s): Monitored during session, Premedicated before session, Repositioned (pt refused ice)    Home Living                          Prior Function            PT Goals (current goals can now be found in the care plan section) Acute Rehab PT Goals PT Goal Formulation: With patient Time For Goal Achievement: 02/10/24 Potential to Achieve Goals: Good Progress towards PT goals: Progressing toward goals    Frequency    7X/week      PT Plan      Co-evaluation              AM-PAC PT 6 Clicks Mobility   Outcome Measure  Help needed turning from your back to your side while in a flat bed without using bedrails?: A Little Help needed moving from  lying on your back to sitting on the side of a flat bed without using bedrails?: A Little Help needed moving to and from a bed to a chair (including a wheelchair)?: A Little Help needed standing up from a chair using your arms (e.g., wheelchair or bedside chair)?: A Little Help needed to walk in hospital room?: A Lot Help needed climbing 3-5 steps with a railing? : A Lot 6 Click Score: 16    End of Session Equipment Utilized During Treatment: Gait belt Activity Tolerance: Patient limited by pain Patient left: with call bell/phone within reach;in bed;with bed alarm set Nurse Communication: Patient requests pain meds PT Visit Diagnosis: Difficulty in walking, not elsewhere classified (R26.2)     Time: 8387-8371 PT Time Calculation (min) (ACUTE ONLY): 16 min  Charges:    $Gait Training: 8-22 mins $Therapeutic Exercise: 8-22 mins PT General Charges $$ ACUTE PT VISIT: 1 Visit                      Depaul Arizpe, PT, MPT Acute Rehabilitation Services Office: [phone] If a weekend: secure chat groups: [facility] PT, [facility] OT, [facility] SLP 01/28/2024    Theador Jezewski 01/28/2024, 5:59 PM

## 2024-01-28 NOTE — Plan of Care (Signed)
" °  Problem: Health Behavior/Discharge Planning: Goal: Ability to manage health-related needs will improve Outcome: Progressing   Problem: Clinical Measurements: Goal: Ability to maintain clinical measurements within normal limits will improve Outcome: Progressing Goal: Will remain free from infection Outcome: Progressing Goal: Diagnostic test results will improve Outcome: Progressing Goal: Respiratory complications will improve Outcome: Progressing Goal: Cardiovascular complication will be avoided Outcome: Progressing   Problem: Activity: Goal: Risk for activity intolerance will decrease Outcome: Progressing   Problem: Coping: Goal: Level of anxiety will decrease Outcome: Progressing   Problem: Elimination: Goal: Will not experience complications related to bowel motility Outcome: Progressing Goal: Will not experience complications related to urinary retention Outcome: Progressing   Problem: Pain Managment: Goal: General experience of comfort will improve and/or be controlled Outcome: Progressing   Problem: Safety: Goal: Ability to remain free from injury will improve Outcome: Progressing   Problem: Skin Integrity: Goal: Risk for impaired skin integrity will decrease Outcome: Progressing   Problem: Education: Goal: Knowledge of the prescribed therapeutic regimen will improve Outcome: Progressing   Problem: Bowel/Gastric: Goal: Gastrointestinal status for postoperative course will improve Outcome: Progressing   Problem: Cardiac: Goal: Ability to maintain an adequate cardiac output Outcome: Progressing Goal: Will show no evidence of cardiac arrhythmias Outcome: Progressing   Problem: Nutritional: Goal: Will attain and maintain optimal nutritional status Outcome: Progressing   Problem: Neurological: Goal: Will regain or maintain usual level of consciousness Outcome: Progressing   Problem: Clinical Measurements: Goal: Ability to maintain clinical  measurements within normal limits Outcome: Progressing Goal: Postoperative complications will be avoided or minimized Outcome: Progressing   Problem: Respiratory: Goal: Will regain and/or maintain adequate ventilation Outcome: Progressing Goal: Respiratory status will improve Outcome: Progressing   Problem: Skin Integrity: Goal: Demonstrates signs of wound healing without infection Outcome: Progressing   Problem: Urinary Elimination: Goal: Will remain free from infection Outcome: Progressing Goal: Ability to achieve and maintain adequate urine output Outcome: Progressing   Problem: Education: Goal: Knowledge of the prescribed therapeutic regimen will improve Outcome: Progressing Goal: Individualized Educational Video(s) Outcome: Progressing   Problem: Activity: Goal: Ability to avoid complications of mobility impairment will improve Outcome: Progressing Goal: Range of joint motion will improve Outcome: Progressing   Problem: Clinical Measurements: Goal: Postoperative complications will be avoided or minimized Outcome: Progressing   Problem: Pain Management: Goal: Pain level will decrease with appropriate interventions Outcome: Progressing   Problem: Skin Integrity: Goal: Will show signs of wound healing Outcome: Progressing   "

## 2024-01-29 ENCOUNTER — Encounter (HOSPITAL_COMMUNITY): Payer: Self-pay | Admitting: Orthopedic Surgery

## 2024-01-29 MED ORDER — BISACODYL 10 MG RE SUPP
10.0000 mg | Freq: Once | RECTAL | Status: AC
  Administered 2024-01-29: 10 mg via RECTAL
  Filled 2024-01-29: qty 1

## 2024-01-29 NOTE — Progress Notes (Signed)
 Physical Therapy Treatment Patient Details Name: Toni [name] MRN: [id number] DOB: 1955/11/26 Today's Date: 01/29/2024   History of Present Illness Pt is a 69 year old female s/p L TKA on 01/26/24.  PMH of hepatitis C, depression, migraines, cervicalgia, and 5th metatarsal fx, left sup/inf pubic rami fxs with adductor strain    PT Comments  Pt is making steady progress. Amb 83' with RW and supervision. Pt has been mobilizing in room on her own to/from bathroom. Tolerating TKA exercises and incr knee ROM. Ice to knee and cervical area EOS. Discussed d/c plan with pt as she previously stated she did not  have any assistance over wknd, now pt stating unsure about assist today.  Pt resides in 2 level townhome, level entry, has roommate upstairs. Pt planned to hire assist through [facility], encouraged pt tor reach out  to determine their availability, TOC updated. Pt could d/c home from PT standpoint anytime     If plan is discharge home, recommend the following: Assistance with cooking/housework;Assist for transportation;Help with stairs or ramp for entrance   Can travel by private vehicle        Equipment Recommendations  None recommended by PT    Recommendations for Other Services       Precautions / Restrictions Precautions Precautions: Fall;Knee Recall of Precautions/Restrictions: Intact Restrictions LLE Weight Bearing Per Provider Order: Weight bearing as tolerated     Mobility  Bed Mobility   Bed Mobility:  (in recliner pre and post session)                Transfers   Equipment used: Rolling walker (2 wheels) Transfers: Sit to/from Stand Sit to Stand: Supervision, Modified independent (Device/Increase time)           General transfer comment: verbal cues for UE and LE positioning for pain control. no physical assist    Ambulation/Gait Ambulation/Gait assistance: Supervision, Contact guard assist Gait Distance (Feet): 65 Feet Assistive  device: Rolling walker (2 wheels) Gait Pattern/deviations: Step-to pattern, Decreased stance time - left Gait velocity: decr btu functional     General Gait Details: ongoing education on proper sequence and RW position. RW adjusted for improved UE position and comfort. wt shift to LLE improved   Stairs             Wheelchair Mobility     Tilt Bed    Modified Rankin (Stroke Patients Only)       Balance                                            Communication Communication Communication: No apparent difficulties  Cognition Arousal: Alert Behavior During Therapy: WFL for tasks assessed/performed   PT - Cognitive impairments: No apparent impairments                         Following commands: Intact      Cueing Cueing Techniques: Verbal cues  Exercises Total Joint Exercises Ankle Circles/Pumps: AROM, 10 reps, Supine, Both Quad Sets: AROM, Left, 10 reps, Supine Heel Slides: AAROM, 10 reps, Left Straight Leg Raises: AAROM, Left, 10 reps Goniometric ROM: grossly -10 to 65 degrees    General Comments        Pertinent Vitals/Pain Pain Assessment Pain Assessment: 0-10 Pain Score: 6  Pain Location: left knee and cervical area Pain Descriptors /  Indicators: Sore, Aching, Tightness Pain Intervention(s): Limited activity within patient's tolerance, Monitored during session, Premedicated before session, Repositioned, Ice applied    Home Living                          Prior Function            PT Goals (current goals can now be found in the care plan section) Acute Rehab PT Goals PT Goal Formulation: With patient Time For Goal Achievement: 02/10/24 Potential to Achieve Goals: Good Progress towards PT goals: Progressing toward goals    Frequency    7X/week      PT Plan      Co-evaluation              AM-PAC PT 6 Clicks Mobility   Outcome Measure  Help needed turning from your back to your side  while in a flat bed without using bedrails?: None Help needed moving from lying on your back to sitting on the side of a flat bed without using bedrails?: None Help needed moving to and from a bed to a chair (including a wheelchair)?: None Help needed standing up from a chair using your arms (e.g., wheelchair or bedside chair)?: None Help needed to walk in hospital room?: A Little Help needed climbing 3-5 steps with a railing? : A Little 6 Click Score: 22    End of Session Equipment Utilized During Treatment: Gait belt Activity Tolerance: Patient tolerated treatment well Patient left: in chair;with call bell/phone within reach;with chair alarm set   PT Visit Diagnosis: Difficulty in walking, not elsewhere classified (R26.2)     Time: 8794-8759 PT Time Calculation (min) (ACUTE ONLY): 35 min  Charges:    $Gait Training: 8-22 mins $Therapeutic Exercise: 8-22 mins PT General Charges $$ ACUTE PT VISIT: 1 Visit                     Rexene, PT  Acute Rehab Dept [facility]) [phone]  01/29/2024    [facility] 01/29/2024, 12:48 PM

## 2024-01-29 NOTE — Care Plan (Signed)
 Ortho [facility] Case Management Note  Patient Details  Name: Toni [name] MRN: [id number] Date of Birth: March 06, 1955  LT TKA 01/26/24   DCP: Home with friend/hired help   DME: No needs; has RW   PT: [facility] [place]                   DME Arranged:  N/A DME Agency:  NA  HH Arranged:    HH Agency:     Additional Comments: Please contact me with any questions of if this plan should need to change.  Burnard Dross, Case Manager [facility] [phone]  Ext. [phone]   01/29/2024, 8:03 AM

## 2024-01-29 NOTE — Plan of Care (Signed)
" °  Problem: Health Behavior/Discharge Planning: Goal: Ability to manage health-related needs will improve Outcome: Progressing   Problem: Clinical Measurements: Goal: Ability to maintain clinical measurements within normal limits will improve Outcome: Progressing Goal: Will remain free from infection Outcome: Progressing Goal: Diagnostic test results will improve Outcome: Progressing Goal: Respiratory complications will improve Outcome: Progressing Goal: Cardiovascular complication will be avoided Outcome: Progressing   Problem: Activity: Goal: Risk for activity intolerance will decrease Outcome: Progressing   Problem: Coping: Goal: Level of anxiety will decrease Outcome: Progressing   Problem: Elimination: Goal: Will not experience complications related to bowel motility Outcome: Progressing Goal: Will not experience complications related to urinary retention Outcome: Progressing   Problem: Pain Managment: Goal: General experience of comfort will improve and/or be controlled Outcome: Progressing   Problem: Safety: Goal: Ability to remain free from injury will improve Outcome: Progressing   Problem: Skin Integrity: Goal: Risk for impaired skin integrity will decrease Outcome: Progressing   Problem: Education: Goal: Knowledge of the prescribed therapeutic regimen will improve Outcome: Progressing   Problem: Bowel/Gastric: Goal: Gastrointestinal status for postoperative course will improve Outcome: Progressing   Problem: Cardiac: Goal: Ability to maintain an adequate cardiac output Outcome: Progressing Goal: Will show no evidence of cardiac arrhythmias Outcome: Progressing   Problem: Nutritional: Goal: Will attain and maintain optimal nutritional status Outcome: Progressing   Problem: Neurological: Goal: Will regain or maintain usual level of consciousness Outcome: Progressing   Problem: Clinical Measurements: Goal: Ability to maintain clinical  measurements within normal limits Outcome: Progressing Goal: Postoperative complications will be avoided or minimized Outcome: Progressing   Problem: Respiratory: Goal: Will regain and/or maintain adequate ventilation Outcome: Progressing Goal: Respiratory status will improve Outcome: Progressing   Problem: Skin Integrity: Goal: Demonstrates signs of wound healing without infection Outcome: Progressing   Problem: Urinary Elimination: Goal: Will remain free from infection Outcome: Progressing Goal: Ability to achieve and maintain adequate urine output Outcome: Progressing   Problem: Education: Goal: Knowledge of the prescribed therapeutic regimen will improve Outcome: Progressing Goal: Individualized Educational Video(s) Outcome: Progressing   Problem: Activity: Goal: Ability to avoid complications of mobility impairment will improve Outcome: Progressing Goal: Range of joint motion will improve Outcome: Progressing   Problem: Clinical Measurements: Goal: Postoperative complications will be avoided or minimized Outcome: Progressing   Problem: Pain Management: Goal: Pain level will decrease with appropriate interventions Outcome: Progressing   Problem: Skin Integrity: Goal: Will show signs of wound healing Outcome: Progressing   "

## 2024-01-29 NOTE — TOC Initial Note (Signed)
 Transition of Care [facility]) - Initial/Assessment Note    Patient Details  Name: Toni [name] MRN: [id number] Date of Birth: 11-22-1955  Transition of Care [facility]) CM/SW Contact:    Heather DELENA Saltness, LCSW Phone Number: 01/29/2024, 8:18 AM  Clinical Narrative:                 Pt refused assessment. Pt to discharge home with OPPT through [facility], prearranged prior to procedure. Pt has RW at home; no DME needs. Pt to discharge home with family support. TOC will continue to follow.    Expected Discharge Plan: Home/Self Care Barriers to Discharge: Continued Medical Work up   Patient Goals and CMS Choice Patient states their goals for this hospitalization and ongoing recovery are:: To return home   Choice offered to / list presented to : NA [place] ownership interest in [facility].provided to:: Parent NA    Expected Discharge Plan and Services In-house Referral: Clinical Social Work Discharge Planning Services: NA Post Acute Care Choice: NA Living arrangements for the past 2 months: Single Family Home                 DME Arranged: N/A DME Agency: NA       HH Arranged: NA HH Agency: NA        Prior Living Arrangements/Services Living arrangements for the past 2 months: Single Family Home Lives with:: Self, Adult Children Patient language and need for interpreter reviewed:: Yes Do you feel safe going back to the place where you live?: Yes      Need for Family Participation in Patient Care: Yes (Comment) Care giver support system in place?: Yes (comment) Current home services: DME Criminal Activity/Legal Involvement Pertinent to Current Situation/Hospitalization: No - Comment as needed  Activities of Daily Living   ADL Screening (condition at time of admission) Independently performs ADLs?: Yes (appropriate for developmental age) Is the patient deaf or have difficulty hearing?: No Does the patient have difficulty seeing, even when  wearing glasses/contacts?: No Does the patient have difficulty concentrating, remembering, or making decisions?: No  Permission Sought/Granted Permission sought to share information with : Case Manager, Family Supports Permission granted to share information with : Yes, Verbal Permission Granted  Share Information with NAME: Bernardino Sow  Permission granted to share info w AGENCY: N/A  Permission granted to share info w Relationship: Son  Permission granted to share info w Contact Information: [phone]  Emotional Assessment Appearance::  (UTA) Attitude/Demeanor/Rapport: Unable to Assess Affect (typically observed): Unable to Assess Orientation: : Oriented to Self, Oriented to Place, Oriented to  Time, Oriented to Situation Alcohol / Substance Use: Not Applicable Psych Involvement: No (comment)  Admission diagnosis:  Primary osteoarthritis of left knee [M17.12] Status post total knee replacement, left [Z96.652] Patient Active Problem List   Diagnosis Date Noted   Status post total knee replacement, left 01/26/2024   Easy bruising 03/21/2022   Common migraine with intractable migraine 12/10/2019   Chronic pain 08/13/2017   Occipital neuralgia of right side 10/21/2016   Chronic migraine 01/26/2015   Cervicogenic headache 12/16/2014   Subclinical hypothyroidism 12/16/2014   Memory loss 10/08/2014   Depression 10/08/2014   Generalized anxiety disorder 10/08/2014   Tremor 10/08/2014   Major depressive disorder, single episode, moderate (HCC) 08/27/2014   Memory difficulties 08/27/2014   Degenerative disc disease, cervical 08/27/2014   Chronically on opiate therapy 08/27/2014   PCP:  Charlett Apolinar POUR, MD Pharmacy:   [facility]  [id number],  - [address] [place] [address] Phone: [phone] Fax: [phone]  [facility] [address], [place] - [address] AT [facility] [address] & [address]  [address] [place] [address] Phone: [phone] Fax: [phone]  [facility] [address] [place] [address] Phone: [phone] Fax: [phone]     Social Drivers of Health (SDOH) Social History: SDOH Screenings   Food Insecurity: No Food Insecurity (01/26/2024)  Housing: Low Risk (01/26/2024)  Transportation Needs: No Transportation Needs (01/26/2024)  Utilities: Not At Risk (01/26/2024)  Alcohol Screen: Low Risk (03/12/2023)  Depression (PHQ2-9): High Risk (11/14/2023)  Financial Resource Strain: Low Risk (03/12/2023)  Physical Activity: Inactive (11/14/2023)  Social Connections: Moderately Integrated (01/26/2024)  Stress: Stress Concern Present (11/14/2023)  Tobacco Use: Medium Risk (01/26/2024)  Health Literacy: Adequate Health Literacy (11/14/2023)   SDOH Interventions: Food Insecurity Interventions: Intervention Not Indicated   Readmission Risk Interventions    01/29/2024    8:13 AM  Readmission Risk Prevention Plan  Post Dischage Appt Complete  Medication Screening Complete  Transportation Screening Complete    Signed: Heather Saltness, MSW, LCSW Clinical Social Worker Inpatient Care Management 01/29/2024 8:25 AM

## 2024-01-29 NOTE — Plan of Care (Signed)
   Problem: Activity: Goal: Risk for activity intolerance will decrease Outcome: Progressing   Problem: Coping: Goal: Level of anxiety will decrease Outcome: Progressing   Problem: Safety: Goal: Ability to remain free from injury will improve Outcome: Progressing

## 2024-01-29 NOTE — Progress Notes (Signed)
 Physical Therapy Treatment Patient Details Name: Toni [name] MRN: [id number] DOB: 01/11/1955 Today's Date: 01/29/2024   History of Present Illness Pt is a 69 year old female s/p L TKA on 01/26/24.  PMH of hepatitis C, depression, migraines, cervicalgia, and 5th metatarsal fx, left sup/inf pubic rami fxs with adductor strain    PT Comments  POD # 3 pm session Pt stated she was able to secure a ride home tomorrow.  Pt just got back to bed from the bathroom, so performed HEP TKR TE's following her handout.  Performed x 5 reps all supine TE's.  Educated on proper tech.  Also, educated on use of her Bone Foam.   Pt plans to D/C to home Tuesday.  First OP appt is Wed.  Pt has her own personal walker in room.  Pt aware on ICE use. Pt will have one PT session tomorrow prior to D/C to home.    If plan is discharge home, recommend the following: Assistance with cooking/housework;Assist for transportation;Help with stairs or ramp for entrance   Can travel by private vehicle        Equipment Recommendations  None recommended by PT    Recommendations for Other Services       Precautions / Restrictions Precautions Precaution/Restrictions Comments: no pillow under knee Restrictions Weight Bearing Restrictions Per Provider Order: No LLE Weight Bearing Per Provider Order: Weight bearing as tolerated           Communication Communication Communication: No apparent difficulties  Cognition Arousal: Alert     PT - Cognitive impairments: No apparent impairments                       PT - Cognition Comments: AxO x 3 pleasant Lady.  Also nervous/anxious        Cueing    Exercises  Total Knee Replacement TE's following HEP handout 10 reps B LE ankle pumps 05 reps towel squeezes 05 reps knee presses 05 reps heel slides  05 reps SAQ's 05 reps SLR's 05 reps ABD Educated on use of gait belt to assist with TE's Followed by ICE     General Comments         Pertinent Vitals/Pain Pain Assessment Pain Assessment: 0-10 Pain Score: 8  Pain Location: L knee with TE's Pain Descriptors / Indicators: Operative site guarding, Grimacing, Tender, Tightness Pain Intervention(s): Monitored during session, Repositioned, Patient requesting pain meds-RN notified, Ice applied    Home Living                          Prior Function            PT Goals (current goals can now be found in the care plan section) Acute Rehab PT Goals PT Goal Formulation: With patient Time For Goal Achievement: 02/10/24 Potential to Achieve Goals: Good Progress towards PT goals: Progressing toward goals    Frequency    7X/week      PT Plan      Co-evaluation              AM-PAC PT 6 Clicks Mobility   Outcome Measure  Help needed turning from your back to your side while in a flat bed without using bedrails?: None Help needed moving from lying on your back to sitting on the side of a flat bed without using bedrails?: None Help needed moving to and from a bed to a chair (including a  wheelchair)?: None Help needed standing up from a chair using your arms (e.g., wheelchair or bedside chair)?: None Help needed to walk in hospital room?: A Little Help needed climbing 3-5 steps with a railing? : A Little 6 Click Score: 22    End of Session Equipment Utilized During Treatment: Gait belt Activity Tolerance: Patient tolerated treatment well Patient left: in bed;with call bell/phone within reach Nurse Communication: Patient requests pain meds PT Visit Diagnosis: Difficulty in walking, not elsewhere classified (R26.2)     Time: 8596-8578 PT Time Calculation (min) (ACUTE ONLY): 18 min  Charges:    $Gait Training: 8-22 mins $Therapeutic Exercise: 8-22 mins PT General Charges $$ ACUTE PT VISIT: 1 Visit                     Katheryn Leap  PTA Acute  Rehabilitation Services Office M-F          [phone]

## 2024-01-29 NOTE — Progress Notes (Signed)
 I followed up with Toni [name] after meeting her before her procedure.  She reports that she is making improvements and feels grateful for the care she's received.  She is anxious about getting home because of the roads and she is not sure who would be able to come and get her.  I provided emotional support through listening as well as ministry of presence.  If needs arise that we can support, please page us  at [phone]

## 2024-01-29 NOTE — Progress Notes (Signed)
 Orthopedics Progress Note  Subjective: Slow but steady progress towards discharge to home with outpatient therapy which is the plan. She reports constipation with no BM yet  Objective:  Vitals:   01/29/24 0523 01/29/24 0819  BP: (!) 144/81 115/69  Pulse: (!) 105 (!) 101  Resp: 17 16  Temp: 99.1 F (37.3 C)   SpO2: (!) 89% 93%    General: Awake and alert  Musculoskeletal: left knee dressing CDI, minimal swelling and no pain with calf pumps and quad sets Compartments supple Neurovascularly intact  Lab Results  Component Value Date   WBC 9.5 01/28/2024   HGB 11.4 (L) 01/28/2024   HCT 36.5 01/28/2024   MCV 91.9 01/28/2024   PLT 211 01/28/2024       Component Value Date/Time   NA 138 01/27/2024 0307   NA 144 04/22/2019 1200   K 4.4 01/27/2024 0307   CL 102 01/27/2024 0307   CO2 26 01/27/2024 0307   GLUCOSE 171 (H) 01/27/2024 0307   BUN 20 01/27/2024 0307   BUN 33 (H) 04/22/2019 1200   CREATININE 0.83 01/27/2024 0307   CREATININE 0.97 03/21/2022 1232   CALCIUM  9.1 01/27/2024 0307   GFRNONAA >60 01/27/2024 0307   GFRNONAA >60 03/21/2022 1232   GFRAA 76 04/22/2019 1200    Lab Results  Component Value Date   INR 1.0 03/21/2022   INR 1.0 01/19/2022    Assessment/Plan: POD #3 s/p Procedures: ARTHROPLASTY, KNEE, TOTAL Stable post op, continue therapy with a goal to go home once she clears PT Post op constipation likely due to narcotics. Will order a dulcolax suppository Discussed with RN  Arvella Fireman PA-C will be covering the patient, call for any questions  Elspeth SAUNDERS. Kay, MD 01/29/2024 8:56 AM

## 2024-01-30 ENCOUNTER — Other Ambulatory Visit (HOSPITAL_COMMUNITY): Payer: Self-pay

## 2024-01-30 ENCOUNTER — Encounter (HOSPITAL_COMMUNITY): Payer: Self-pay | Admitting: Orthopedic Surgery

## 2024-01-30 NOTE — Discharge Summary (Signed)
 "  In most cases prophylactic antibiotics for Dental procdeures after total joint surgery are not necessary.  Exceptions are as follows:  1. History of prior total joint infection  2. Severely immunocompromised (Organ Transplant, cancer chemotherapy, Rheumatoid biologic meds such as Humera)  3. Poorly controlled diabetes (A1C &gt; 8.0, blood glucose over 200)  If you have one of these conditions, contact your surgeon for an antibiotic prescription, prior to your dental procedure. Orthopedic Discharge Summary        Physician Discharge Summary  Patient ID: Toni [name] MRN: [id number] DOB/AGE: August 13, 1955 69 y.o.  Admit date: 01/26/2024 Discharge date: 01/30/2024   Procedures:  Procedures (LRB): ARTHROPLASTY, KNEE, TOTAL (Left)  Attending Physician:  Dr. Elspeth Her  Admission Diagnoses:   left knee end stage osteoarthritis  Discharge Diagnoses:  left knee end stage osteoarthritis   Past Medical History:  Diagnosis Date   Arthritis    Brachial neuritis or radiculitis NOS    Chronic hepatitis C (HCC) 10/05/2015   treated and rsolved   Chronic post-traumatic stress disorder (PTSD)    Complication of anesthesia    woke up during colonoscopy   Depressive disorder, not elsewhere classified    Essential and other specified forms of tremor    Fracture of 5th metatarsal 08/13/2017   Fracture of multiple pubic rami (HCC) 08/13/2017   Headache(784.0)    Hepatitis C infection 12/05/2017   Treated and now resolved and cured  as of early 2018  And      Hypertension    mild   Hypothyroidism    Memory loss    better see evaluation   Microscopic hematuria    seed dr chales in past   Migraine, unspecified, without mention of intractable migraine without mention of status migrainosus    Multiple rib fractures 08/13/2017   Occipital neuritis    Other specified disorders of rotator cuff syndrome of shoulder and allied disorders    Vulvar pain    rx with  valium  supp    PCP: Charlett Apolinar POUR, MD   Discharged Condition: fair  Hospital Course:  Patient underwent the above stated procedure on 01/26/2024. Patient tolerated the procedure well and brought to the recovery room in good condition and subsequently to the floor. Patient had an uncomplicated hospital course and was stable for discharge.   Disposition: Discharge disposition: 01-Home or Self Care      with follow up in 2 weeks    Follow-up Information     Her Kemps, MD. Go on 02/08/2024.   Specialty: Orthopedic Surgery Why: You are scheduled for a post op appointment on Thursday 02/08/24 at 3:00pm Contact information: [address] [place] [address] [phone]                 Dental Antibiotics:  In most cases prophylactic antibiotics for Dental procdeures after total joint surgery are not necessary.  Exceptions are as follows:  1. History of prior total joint infection  2. Severely immunocompromised (Organ Transplant, cancer chemotherapy, Rheumatoid biologic meds such as Humera)  3. Poorly controlled diabetes (A1C &gt; 8.0, blood glucose over 200)  If you have one of these conditions, contact your surgeon for an antibiotic prescription, prior to your dental procedure.  Discharge Instructions     Call MD / Call 911   Complete by: As directed    If you experience chest pain or shortness of breath, CALL 911 and be transported to the hospital emergency room.  If you  develope a fever above 101 F, pus (white drainage) or increased drainage or redness at the wound, or calf pain, call your surgeon's office.   Constipation Prevention   Complete by: As directed    Drink plenty of fluids.  Prune juice may be helpful.  You may use a stool softener, such as Colace (over the counter) 100 mg twice a day.  Use MiraLax  (over the counter) for constipation as needed.   Increase activity slowly as tolerated   Complete by: As directed    Post-operative  opioid taper instructions:   Complete by: As directed    POST-OPERATIVE OPIOID TAPER INSTRUCTIONS: It is important to wean off of your opioid medication as soon as possible. If you do not need pain medication after your surgery it is ok to stop day one. Opioids include: Codeine, Hydrocodone (Norco, Vicodin), Oxycodone (Percocet, oxycontin ) and hydromorphone  amongst others.  Long term and even short term use of opiods can cause: Increased pain response Dependence Constipation Depression Respiratory depression And more.  Withdrawal symptoms can include Flu like symptoms Nausea, vomiting And more Techniques to manage these symptoms Hydrate well Eat regular healthy meals Stay active Use relaxation techniques(deep breathing, meditating, yoga) Do Not substitute Alcohol to help with tapering If you have been on opioids for less than two weeks and do not have pain than it is ok to stop all together.  Plan to wean off of opioids This plan should start within one week post op of your joint replacement. Maintain the same interval or time between taking each dose and first decrease the dose.  Cut the total daily intake of opioids by one tablet each day Next start to increase the time between doses. The last dose that should be eliminated is the evening dose.          Allergies as of 01/30/2024       Reactions   Codeine Nausea And Vomiting, Other (See Comments)   Shuts down GI tract   Crestor  [rosuvastatin ] Diarrhea   Patient  reported diarrhea  see 7/25 note.        Medication List     TAKE these medications    albuterol  108 (90 Base) MCG/ACT inhaler Commonly known as: VENTOLIN  HFA Inhale 2 puffs into the lungs every 6 (six) hours as needed for wheezing or shortness of breath.   alum hydroxide-mag trisilicate 80-20 MG Chew chewable tablet Commonly known as: GAVISCON Chew 2 tablets by mouth 3 (three) times daily as needed for indigestion or heartburn.   amLODipine  2.5 MG  tablet Commonly known as: NORVASC  TAKE 1 TABLET BY MOUTH DAILY   Aspirin  Low Dose 81 MG chewable tablet Generic drug: aspirin  Chew 1 tablet (81 mg total) by mouth 2 (two) times daily.   Centrum Silver Adult 50+ Tabs Take 1 tablet by mouth daily.   cetirizine 10 MG tablet Commonly known as: ZYRTEC Take 10 mg by mouth daily as needed for allergies.   clonazePAM  0.5 MG tablet Commonly known as: KLONOPIN  Take 0.5 mg by mouth 2 (two) times daily as needed for anxiety.   desvenlafaxine  50 MG 24 hr tablet Commonly known as: PRISTIQ  Take 50 mg by mouth daily. What changed: Another medication with the same name was added. Make sure you understand how and when to take each.   desvenlafaxine  50 MG 24 hr tablet Commonly known as: Pristiq  Take 1 tablet (50 mg total) by mouth daily as directed. What changed: You were already taking a medication with the same name,  and this prescription was added. Make sure you understand how and when to take each.   dextroamphetamine  15 MG 24 hr capsule Commonly known as: DEXEDRINE  SPANSULE Take 30 mg by mouth daily.   Estroven + Energy Max Strength Tabs Take 1 capsule by mouth daily.   fluticasone  50 MCG/ACT nasal spray Commonly known as: FLONASE  Place 2 sprays into both nostrils daily as needed for allergies.   gabapentin  600 MG tablet Commonly known as: NEURONTIN  Take 600 mg by mouth 3 (three) times daily. PO TID FOR HEAD PAIN   HYDROcodone -acetaminophen  10-325 MG tablet Commonly known as: NORCO Take 1 tablet by mouth every 8 (eight) hours as needed for severe pain (pain score 7-10).   HYDROmorphone  2 MG tablet Commonly known as: Dilaudid  Take 1 tablet (2 mg total) by mouth every 4 (four) hours as needed for up to 7 days for severe pain (pain score 7-10) (to be used for breakthrough pain).   ketoconazole  2 % cream Commonly known as: NIZORAL  APPLY TO AFFECTED AREA TWICE A DAY What changed:  how much to take how to take this when to take  this reasons to take this additional instructions   levothyroxine  50 MCG tablet Commonly known as: SYNTHROID  Take 1 tablet (50 mcg total) by mouth daily.   methocarbamol  500 MG tablet Commonly known as: ROBAXIN  Take 1 tablet (500 mg total) by mouth every 6 (six) hours as needed for muscle spasms.   ondansetron  4 MG tablet Commonly known as: ZOFRAN  Take 1 tablet (4 mg total) by mouth every 8 (eight) hours as needed for nausea, vomiting or refractory nausea / vomiting.   SUMAtriptan  25 MG tablet Commonly known as: Imitrex  Take 1 tablet (25 mg total) by mouth as needed for migraine. May repeat in 2 hours if headache persists or recurs.   SUMAtriptan  6 MG/0.5ML Soaj Use 1 syringe for migraines upon awakening.  Do not use with Imitrex  tablets.          Signed: Debby KATHEE Fireman 01/30/2024, 8:36 AM  [facility] is now [facility] [address], [place], [place] [address] Phone: [phone] Facebook  Instagram  LinkedIn  Twitter    "

## 2024-01-30 NOTE — Progress Notes (Signed)
" ° °  Subjective: 4 Days Post-Op Procedures (LRB): ARTHROPLASTY, KNEE, TOTAL (Left)  Pt c/o moderate pain but ready for d/c home Denies any new symptoms or issues  Patient reports pain as moderate.  Objective:   VITALS:   Vitals:   01/29/24 2026 01/30/24 0532  BP: (!) 115/59 109/63  Pulse: 99 97  Resp: 18 16  Temp: 99.4 F (37.4 C) 98.7 F (37.1 C)  SpO2: 93% 94%    Left knee incision healing well Dressing intact Nv intact distally No rashes or edema distally Guarded rom  LABS Recent Labs    01/28/24 0332  HGB 11.4*  HCT 36.5  WBC 9.5  PLT 211    No results for input(s): NA, K, BUN, CREATININE, GLUCOSE in the last 72 hours.   Assessment/Plan: 4 Days Post-Op Procedures (LRB): ARTHROPLASTY, KNEE, TOTAL (Left) D/c home today after therapy F/u in 2 weeks in the office Outpatient physical therapy Pulmonary toilet   Brad Melvenia RIGGERS, MPAS [facility] is now [facility]  [place] [address], [address], [place], [place] [address] Phone: [phone] [email or web address] Facebook  [employer]      "

## 2024-01-30 NOTE — Progress Notes (Signed)
 Discharge meds in a secure bag x 2 delivered to patient by this RN, retrieved from inpatient pharmacy

## 2024-01-30 NOTE — Progress Notes (Addendum)
 Physical Therapy Treatment Patient Details Name: Toni [name] MRN: [id number] DOB: 07/26/1955 Today's Date: 01/30/2024   History of Present Illness Pt is a 69 year old female s/p L TKA on 01/26/24.  PMH of hepatitis C, depression, migraines, cervicalgia, and 5th metatarsal fx, left sup/inf pubic rami fxs with adductor strain    PT Comments  POD # 4 am session  Pt was AxO x3 and also emotional, nervous, anxious during session. Pt stated I had to use the bathroom ringed the call bell three times, nobody showed up  so she amb to the restroom by her self.  Educated the patient for safety reasons to wait for staff to assist you to the bathroom.  Pt stated she is ready to go home.  Assisted OOB. Pt required no VC's with bed mobility, pt was impulsive. Mod VC's for proper hand placement to avoid pulling self up using walker. Assisted with a quick wash up static standing at sink x 4 to 5 mins.  Applied new gown.  Then assisted pt with ambulation 30 ft CGA step through pattern.  Educated the pt to decrease her gait speed/walking pace and take small steps to increase safety/balance while using walker. Educated pt on HEP/handout review but due to pain level she was not able to currently perform.   Pt exhibits High Anxiety and required repeat instructions.  Pt has met her mobility goals to D/C to home today.  She is arranging a ride via [place] for later today.     If plan is discharge home, recommend the following: Assistance with cooking/housework;Assist for transportation;Help with stairs or ramp for entrance   Can travel by private vehicle        Equipment Recommendations  None recommended by PT    Recommendations for Other Services       Precautions / Restrictions Precautions Precautions: Fall;Knee Recall of Precautions/Restrictions: Intact Precaution/Restrictions Comments: no pillow under knee Restrictions Weight Bearing Restrictions Per Provider Order: No LLE Weight  Bearing Per Provider Order: Weight bearing as tolerated     Mobility  Bed Mobility Overal bed mobility: Modified Independent Bed Mobility: Supine to Sit     Supine to sit: Supervision, HOB elevated     General bed mobility comments: pt required no VC's with bed mobility, pt was impulsive supine to sit did not have time to tell her to push off the bed verses geting up with the RW.    Transfers Overall transfer level: Needs assistance Equipment used: Rolling walker (2 wheels) Transfers: Sit to/from Stand Sit to Stand: Supervision           General transfer comment: pt requied VC's for correct hand placement verses getting up with the walker    Ambulation/Gait Ambulation/Gait assistance: Contact guard assist Gait Distance (Feet): 30 Feet Assistive device: Rolling walker (2 wheels) Gait Pattern/deviations: Step-through pattern       General Gait Details: educated the pt to slow down walking pace and take small steps   Stairs             Wheelchair Mobility     Tilt Bed    Modified Rankin (Stroke Patients Only)       Balance                                            Communication Communication Communication: No apparent difficulties  Cognition Arousal:  Alert Behavior During Therapy: WFL for tasks assessed/performed   PT - Cognitive impairments: No apparent impairments                       PT - Cognition Comments: pt was AxO x 3 showed signs of nervous/anxious/emotional Following commands: Intact      Cueing Cueing Techniques: Verbal cues  Exercises      General Comments        Pertinent Vitals/Pain Pain Assessment Pain Assessment: 0-10 Pain Score: 8  Pain Location: L knee with mobility Pain Descriptors / Indicators: Operative site guarding, Grimacing Pain Intervention(s): Monitored during session, Ice applied, Repositioned, Patient requesting pain meds-RN notified    Home Living                           Prior Function            PT Goals (current goals can now be found in the care plan section)      Frequency    7X/week      PT Plan      Co-evaluation              AM-PAC PT 6 Clicks Mobility   Outcome Measure  Help needed turning from your back to your side while in a flat bed without using bedrails?: None Help needed moving from lying on your back to sitting on the side of a flat bed without using bedrails?: None Help needed moving to and from a bed to a chair (including a wheelchair)?: None Help needed standing up from a chair using your arms (e.g., wheelchair or bedside chair)?: None Help needed to walk in hospital room?: None Help needed climbing 3-5 steps with a railing? : A Little 6 Click Score: 23    End of Session Equipment Utilized During Treatment: Gait belt Activity Tolerance: Patient tolerated treatment well Patient left: in chair;with call bell/phone within reach;with bed alarm set Nurse Communication: Patient requests pain meds PT Visit Diagnosis: Difficulty in walking, not elsewhere classified (R26.2)     Time: 8973-8941 PT Time Calculation (min) (ACUTE ONLY): 32 min  Charges:    $Gait Training: 8-22 mins $Therapeutic Activity: 8-22 mins PT General Charges $$ ACUTE PT VISIT: 1 Visit                    Laneta Edison, SPTA    I agree with the following treatment note.  This session was performed under the supervision of a licensed clinician  Katheryn Leap  PTA Acute  Rehabilitation Services Office M-F          [phone]

## 2024-01-30 NOTE — Care Management Important Message (Signed)
 Important Message  Patient Details IM Letter given. Name: Toni [name] MRN: [id number] Date of Birth: 18-Oct-1955   Important Message Given:  Yes - Medicare IM     Chella Chapdelaine 01/30/2024, 10:31 AM

## 2024-01-30 NOTE — Anesthesia Postprocedure Evaluation (Signed)
"   Anesthesia Post Note  Patient: Toni [name] [facility]  Procedure(s) Performed: ARTHROPLASTY, KNEE, TOTAL (Left: Knee)     Patient location during evaluation: PACU Anesthesia Type: MAC Level of consciousness: awake and alert Pain management: pain level controlled Vital Signs Assessment: post-procedure vital signs reviewed and stable Respiratory status: spontaneous breathing, nonlabored ventilation, respiratory function stable and patient connected to nasal cannula oxygen Cardiovascular status: blood pressure returned to baseline and stable Postop Assessment: no apparent nausea or vomiting Anesthetic complications: no   No notable events documented.  Last Vitals:  Vitals:   01/29/24 2026 01/30/24 0532  BP: (!) 115/59 109/63  Pulse: 99 97  Resp: 18 16  Temp: 37.4 C 37.1 C  SpO2: 93% 94%    Last Pain:  Vitals:   01/30/24 0533  TempSrc:   PainSc: Asleep                 Toni [name]      "

## 2024-01-31 ENCOUNTER — Other Ambulatory Visit: Payer: Self-pay

## 2024-01-31 ENCOUNTER — Ambulatory Visit: Attending: Orthopedic Surgery

## 2024-01-31 DIAGNOSIS — M25662 Stiffness of left knee, not elsewhere classified: Secondary | ICD-10-CM | POA: Insufficient documentation

## 2024-01-31 DIAGNOSIS — G8929 Other chronic pain: Secondary | ICD-10-CM | POA: Insufficient documentation

## 2024-01-31 DIAGNOSIS — R262 Difficulty in walking, not elsewhere classified: Secondary | ICD-10-CM | POA: Insufficient documentation

## 2024-01-31 DIAGNOSIS — M25562 Pain in left knee: Secondary | ICD-10-CM | POA: Insufficient documentation

## 2024-01-31 DIAGNOSIS — M6281 Muscle weakness (generalized): Secondary | ICD-10-CM | POA: Insufficient documentation

## 2024-01-31 NOTE — Therapy (Signed)
 " OUTPATIENT PHYSICAL THERAPY LOWER EXTREMITY RE-EVALUATION   Patient Name: Toni [name] MRN: [id number] DOB:25-Apr-1955, 69 y.o., female Today's Date: 01/31/2024  END OF SESSION:  PT End of Session - 01/31/24 1505     Visit Number 2    Date for Recertification  03/19/24    Progress Note Due on Visit 11    PT Start Time 1425    PT Stop Time 1508    PT Time Calculation (min) 43 min    Activity Tolerance Patient tolerated treatment well    Behavior During Therapy [facility] for tasks assessed/performed           Past Medical History:  Diagnosis Date   Arthritis    Brachial neuritis or radiculitis NOS    Chronic hepatitis C (HCC) 10/05/2015   treated and rsolved   Chronic post-traumatic stress disorder (PTSD)    Complication of anesthesia    woke up during colonoscopy   Depressive disorder, not elsewhere classified    Essential and other specified forms of tremor    Fracture of 5th metatarsal 08/13/2017   Fracture of multiple pubic rami (HCC) 08/13/2017   Headache(784.0)    Hepatitis C infection 12/05/2017   Treated and now resolved and cured  as of early 2018  And      Hypertension    mild   Hypothyroidism    Memory loss    better see evaluation   Microscopic hematuria    seed dr chales in past   Migraine, unspecified, without mention of intractable migraine without mention of status migrainosus    Multiple rib fractures 08/13/2017   Occipital neuritis    Other specified disorders of rotator cuff syndrome of shoulder and allied disorders    Vulvar pain    rx with valium  supp   Past Surgical History:  Procedure Laterality Date   BREAST EXCISIONAL BIOPSY Right    No scar   CESAREAN SECTION     x3   COLONOSCOPY     ORIF ELBOW FRACTURE Right 06/2023   SHOULDER ARTHROSCOPY W/ ROTATOR CUFF REPAIR Right 2004   SHOULDER ARTHROSCOPY W/ ROTATOR CUFF REPAIR Left 2008   done twice   TOTAL KNEE ARTHROPLASTY Left 01/26/2024   Procedure: ARTHROPLASTY, KNEE,  TOTAL;  Surgeon: Kay Kemps, MD;  Location: [facility];  Service: Orthopedics;  Laterality: Left;   tubal reversal     Patient Active Problem List   Diagnosis Date Noted   Status post total knee replacement, left 01/26/2024   Easy bruising 03/21/2022   Common migraine with intractable migraine 12/10/2019   Chronic pain 08/13/2017   Occipital neuralgia of right side 10/21/2016   Chronic migraine 01/26/2015   Cervicogenic headache 12/16/2014   Subclinical hypothyroidism 12/16/2014   Memory loss 10/08/2014   Depression 10/08/2014   Generalized anxiety disorder 10/08/2014   Tremor 10/08/2014   Major depressive disorder, single episode, moderate (HCC) 08/27/2014   Memory difficulties 08/27/2014   Degenerative disc disease, cervical 08/27/2014   Chronically on opiate therapy 08/27/2014    PCP: Charlett Apolinar POUR  REFERRING PROVIDER: Kay Kemps, MD  REFERRING DIAG: L knee osteoarthritis, TKA 01/26/23  THERAPY DIAG:  Decreased ROM of left knee  Difficulty in walking, not elsewhere classified  Chronic pain of left knee  Acute pain of left knee  Muscle weakness (generalized)  Rationale for Evaluation and Treatment: Rehabilitation  ONSET DATE: chronic greater than one year  SUBJECTIVE:   SUBJECTIVE STATEMENT:01/31/24:  I came home yesterday from the hospital, the  help whom I had hired cancelled due to weather, I had to [place] home, and to today's appt.  I can't put the knee high TED hose on by myself so brought with me if you could help me with my L leg. My knee hurts, getting in and out of car was really hard .  PREOP apptI never got better after my L knee arthroscopy and I have been losing mobility, cannot tolerate walking through grocery store, difficult to get out of house and drive  PERTINENT HISTORY: Injured L knee greater than one year ago, underwent arthroscopic repair, now pain progressing, to have TKA 01/26/24 so today is pre op visit PAIN:  Are you having pain? Yes:  NPRS scale: 5 to 10 Pain location: L knee anterior Pain description: burning Aggravating factors: standing walking Relieving factors: lying on sofa, takes pain meds  PRECAUTIONS: None  RED FLAGS: None   WEIGHT BEARING RESTRICTIONS: No  FALLS:  Has patient fallen in last 6 months? Yes. Number of falls 1 had fracture R forearm with plate, occurred June 2025  LIVING ENVIRONMENT: Lives with: lives alone Lives in: House/apartment Stairs: Yes: Internal: one flight steps; on right going up, doesn't go up there much Has following equipment at home: Single point cane, Walker - 2 wheeled, and Environmental Consultant - 4 wheeled  OCCUPATION: retired/disabled  PLOF: Independent with household mobility with device  PATIENT GOALS: revcover function to be able to travel with my grand son  NEXT MD VISIT: 01/31/24. Scheduled for surgery, TKA L   OBJECTIVE:  Note: Objective measures were completed at Evaluation unless otherwise noted.  DIAGNOSTIC FINDINGS: na  PATIENT SURVEYS:  Lower Extremity Functional Score: 23 / 80 = 28.7 %  COGNITION: Overall cognitive status: Within functional limits for tasks assessed     SENSATION: Wnl LE's  EDEMA:  Mild edema noted B lower legs  POSTURE: standing wide base of support, no varum or valgum noted  PALPATION: Pt tender L infrapatellar tendon  LOWER EXTREMITY ROM:  Active ROM Right eval Left eval Left AAROM 01/31/23  Hip flexion     Hip extension     Hip abduction     Hip adduction     Hip internal rotation     Hip external rotation     Knee flexion  118 82  Knee extension  0 -14  Ankle dorsiflexion     Ankle plantarflexion     Ankle inversion     Ankle eversion      (Blank rows = not tested)  LOWER EXTREMITY MMT: L SLR with 28 degree lag   MMT Right eval Left eval L 01/31/24  Hip flexion  P!4- 5  Hip extension     Hip abduction     Hip adduction     Hip internal rotation     Hip external rotation     Knee flexion  P! 4 3+  Knee  extension  P! 4 3-  Ankle dorsiflexion     Ankle plantarflexion     Ankle inversion     Ankle eversion      (Blank rows = not tested)    FUNCTIONAL TESTS:  30 sec sit to stand attempted but pt c/o L knee  pain so not completed  GAIT: Distance walked: in clinic up to 12' Assistive device utilized: None Level of assistance: Modified independence Antalgic L, decreased stance time on L 01/31/24: gait with walker, decreased stance time on L noted utilizing heel/toe pattern L  TREATMENT DATE:  01/31/24: Re: evaluation:  Observed pts L knee, skin, aquacel bandage intact, several small areas of dime sized drainage noted.  Fading hematomas L post knee and calf Mod edema L leg thigh to ankle.  No redness noted. Mild warmth L medial knee . Instructed and adapted her home exercise program, as indicated below :  Assisted pt with the following: Supine quad sets, supine assisted L heel slides, L SLR, seated L knee flexion stretch, L LE seated long arc quads with yoga strap assist, L ankle pumps with blue t band  Supine with L LE on wedge for vasopneumatic/game ready for 10 min at 34 degrees to assist with edema and pain post session.  12/25/23:  Evaluation, we discussed/ education regarding clearing pathways, removing throw rugs and tripping hazards.  Discussed whether she needs BSC at home but she has a good set up and doesn't think she needs.  She has a walker , advised her to label with her name and have it brought to hospital day after surgery to have it adjusted appropriately for her. Instructed her in the exercises as described below to perform to engage, provide motor training for stability L LE in preparation for surgery.   PATIENT EDUCATION:  Education details: POC, goals Person educated: Patient Education method: Explanation Education comprehension: verbalized  understanding, returned demonstration, verbal cues required, tactile cues required, and needs further education  HOME EXERCISE PROGRAM: Access Code: [email or web address] URL: [email or web address] Date: 01/31/2024 Prepared by: Greig Credit  Exercises - Active Straight Leg Raise with Quad Set  - 1 x daily - 7 x weekly - 3 sets - 10 reps - Ankle and Toe Plantarflexion with Resistance  - 1 x daily - 7 x weekly - 3 sets - 10 reps - Seated Long Arc Quad with Strap  - 1 x daily - 7 x weekly - 3 sets - 10 reps - Supine Quad Set  - 1 x daily - 7 x weekly - 3 sets - 10 reps - Seated Knee Flexion Slide  - 1 x daily - 7 x weekly - 3 sets - 10 reps    Access Code: [email or web address] URL: [email or web address] Date: 12/25/2023 Prepared by: Greig Credit  Exercises - Active Straight Leg Raise with Quad Set  - 1 x daily - 7 x weekly - 3 sets - 10 reps - Quad Sets with Compression Garment  - 1 x daily - 7 x weekly - 3 sets - 10 reps - Supine Ankle Pumps  - 1 x daily - 7 x weekly - 3 sets - 10 reps - Glute Squeezes with Compression Garment  - 1 x daily - 7 x weekly - 3 sets - 10 reps  ASSESSMENT:  CLINICAL IMPRESSION: Patient is a 69 y.o. female who was reassessed today following her L TKA on 01/26/24, so she is 5 days post op.  She presents with painful L knee particularly anterior patella, also decreased strength and decreased ROM L knee.  Altered gait, assistance needed for bed mobility and for donning her TED hose L.  Her quads strength is weak but she is demonstrating good quads control at this point after her surgery.  She was to have home health care for CNA services but the snow and ice limited their ability to get to her home so she is experiencing less support than anticipated.  She should benefit from physical therapy to address her deficits and assist her with recovering her function L LE.  Her long term  goals are to be able to continue to live independently and be able to travel with her  family.   ACTIVITY LIMITATIONS: carrying, lifting, bending, standing, squatting, sleeping, stairs, transfers, bed mobility, bathing, toileting, dressing, and locomotion level  PARTICIPATION LIMITATIONS: meal prep, cleaning, laundry, driving, shopping, community activity, and church  PERSONAL FACTORS: Age, Fitness, Past/current experiences, Time since onset of injury/illness/exacerbation, and 1-2 comorbidities: chronic pain, depression are also affecting patient's functional outcome.   REHAB POTENTIAL: Good  CLINICAL DECISION MAKING: Evolving/moderate complexity  EVALUATION COMPLEXITY: Moderate.   OBJECTIVE IMPAIRMENTS: decreased activity tolerance, decreased endurance, decreased mobility, difficulty walking, decreased ROM, decreased strength, increased edema, impaired perceived functional ability, impaired flexibility, postural dysfunction, and pain   GOALS: Goals reviewed with patient? Yes  SHORT TERM GOALS: Target date: 02/14/24 I in performance of home program in preparation for TKA L Baseline: Goal status: INITIAL   LONG TERM GOALS: Target date: 03/19/24 12 weeks from preop appt:  30 sec sit to stand greater than 10 reps  Baseline: unable to complete due ot L knee pain Goal status: INITIAL  2.  ROM L knee -5 to 115 degrees Baseline: preop 0 to 118 degrees Goal status: INITIAL 01/31/24: see chart above  3.  Strength with MMT L quads, hamstrings 4+/5, no lag with SLR and with L long arc quad Baseline: painful with testing for preop appt Goal status: INITIAL 01/31/24: see chart above  4.  Lower Extremity Functional Score: 23 / 80 = 28.7 % improve to 60% or greater function Baseline:  Goal status: INITIAL 01/31/24:Lower Extremity Functional Score: 13 / 80 = 16.3 %  5.  Gait with LAD device greater than 6 min at 20m/sec for community ambulation Baseline:  Goal status: INITIAL    PLAN:  PT FREQUENCY: preop eval today, will establish frequency on first post op appt on  01/31/24  PT DURATION: 12 weeks  PLANNED INTERVENTIONS: 97110-Therapeutic exercises, 97530- Therapeutic activity, 97112- Neuromuscular re-education, 97535- Self Care, [id number]- Manual therapy, 97016- Vasopneumatic device, Patient/Family education, and Cryotherapy  PLAN FOR NEXT SESSION: Need to progress with ROM L knee , pain management , motor recruitment , gait training, edema management  Micah Barnier L Mizuki Hoel, PT, DPT, OCS 01/31/2024, 5:09 PM   "

## 2024-02-02 ENCOUNTER — Ambulatory Visit

## 2024-02-05 ENCOUNTER — Ambulatory Visit

## 2024-02-07 ENCOUNTER — Ambulatory Visit

## 2024-02-07 NOTE — Therapy (Unsigned)
 " OUTPATIENT PHYSICAL THERAPY LOWER EXTREMITY RE-EVALUATION   Patient Name: Toni [name] MRN: [id number] DOB:[birth year], 69 y.o., female Today's Date: 02/07/2024  END OF SESSION:     Past Medical History:  Diagnosis Date   Arthritis    Brachial neuritis or radiculitis NOS    Chronic hepatitis C (HCC) 10/05/2015   treated and rsolved   Chronic post-traumatic stress disorder (PTSD)    Complication of anesthesia    woke up during colonoscopy   Depressive disorder, not elsewhere classified    Essential and other specified forms of tremor    Fracture of 5th metatarsal 08/13/2017   Fracture of multiple pubic rami (HCC) 08/13/2017   Headache(784.0)    Hepatitis C infection 12/05/2017   Treated and now resolved and cured  as of early 2018  And      Hypertension    mild   Hypothyroidism    Memory loss    better see evaluation   Microscopic hematuria    seed dr chales in past   Migraine, unspecified, without mention of intractable migraine without mention of status migrainosus    Multiple rib fractures 08/13/2017   Occipital neuritis    Other specified disorders of rotator cuff syndrome of shoulder and allied disorders    Vulvar pain    rx with valium  supp   Past Surgical History:  Procedure Laterality Date   BREAST EXCISIONAL BIOPSY Right    No scar   CESAREAN SECTION     x3   COLONOSCOPY     ORIF ELBOW FRACTURE Right 06/2023   SHOULDER ARTHROSCOPY W/ ROTATOR CUFF REPAIR Right 2004   SHOULDER ARTHROSCOPY W/ ROTATOR CUFF REPAIR Left 2008   done twice   TOTAL KNEE ARTHROPLASTY Left 01/26/2024   Procedure: ARTHROPLASTY, KNEE, TOTAL;  Surgeon: Kay Kemps, MD;  Location: [facility];  Service: Orthopedics;  Laterality: Left;   tubal reversal     Patient Active Problem List   Diagnosis Date Noted   Status post total knee replacement, left 01/26/2024   Easy bruising 03/21/2022   Common migraine with intractable migraine 12/10/2019   Chronic pain 08/13/2017    Occipital neuralgia of right side 10/21/2016   Chronic migraine 01/26/2015   Cervicogenic headache 12/16/2014   Subclinical hypothyroidism 12/16/2014   Memory loss 10/08/2014   Depression 10/08/2014   Generalized anxiety disorder 10/08/2014   Tremor 10/08/2014   Major depressive disorder, single episode, moderate (HCC) 08/27/2014   Memory difficulties 08/27/2014   Degenerative disc disease, cervical 08/27/2014   Chronically on opiate therapy 08/27/2014    PCP: Charlett Apolinar POUR  REFERRING PROVIDER: Kay Kemps, MD  REFERRING DIAG: L knee osteoarthritis, TKA 01/26/23  THERAPY DIAG:  No diagnosis found.  Rationale for Evaluation and Treatment: Rehabilitation  ONSET DATE: chronic greater than one year  SUBJECTIVE:   SUBJECTIVE STATEMENT:01/31/24:  I came home yesterday from the hospital, the help whom I had hired cancelled due to weather, I had to [place] home, and to today's appt.  I can't put the knee high TED hose on by myself so brought with me if you could help me with my L leg. My knee hurts, getting in and out of car was really hard .  PREOP apptI never got better after my L knee arthroscopy and I have been losing mobility, cannot tolerate walking through grocery store, difficult to get out of house and drive  PERTINENT HISTORY: Injured L knee greater than one year ago, underwent arthroscopic repair, now pain progressing, to  have TKA 01/26/24 so today is pre op visit PAIN:  Are you having pain? Yes: NPRS scale: 5 to 10 Pain location: L knee anterior Pain description: burning Aggravating factors: standing walking Relieving factors: lying on sofa, takes pain meds  PRECAUTIONS: None  RED FLAGS: None   WEIGHT BEARING RESTRICTIONS: No  FALLS:  Has patient fallen in last 6 months? Yes. Number of falls 1 had fracture R forearm with plate, occurred June 2025  LIVING ENVIRONMENT: Lives with: lives alone Lives in: House/apartment Stairs: Yes: Internal: one flight steps;  on right going up, doesn't go up there much Has following equipment at home: Single point cane, Walker - 2 wheeled, and Environmental Consultant - 4 wheeled  OCCUPATION: retired/disabled  PLOF: Independent with household mobility with device  PATIENT GOALS: revcover function to be able to travel with my grand son  NEXT MD VISIT: 01/31/24. Scheduled for surgery, TKA L   OBJECTIVE:  Note: Objective measures were completed at Evaluation unless otherwise noted.  DIAGNOSTIC FINDINGS: na  PATIENT SURVEYS:  Lower Extremity Functional Score: 23 / 80 = 28.7 %  COGNITION: Overall cognitive status: Within functional limits for tasks assessed     SENSATION: Wnl LE's  EDEMA:  Mild edema noted B lower legs  POSTURE: standing wide base of support, no varum or valgum noted  PALPATION: Pt tender L infrapatellar tendon  LOWER EXTREMITY ROM:  Active ROM Right eval Left eval Left AAROM 01/31/23  Hip flexion     Hip extension     Hip abduction     Hip adduction     Hip internal rotation     Hip external rotation     Knee flexion  118 82  Knee extension  0 -14  Ankle dorsiflexion     Ankle plantarflexion     Ankle inversion     Ankle eversion      (Blank rows = not tested)  LOWER EXTREMITY MMT: L SLR with 28 degree lag   MMT Right eval Left eval L 01/31/24  Hip flexion  P!4- 5  Hip extension     Hip abduction     Hip adduction     Hip internal rotation     Hip external rotation     Knee flexion  P! 4 3+  Knee extension  P! 4 3-  Ankle dorsiflexion     Ankle plantarflexion     Ankle inversion     Ankle eversion      (Blank rows = not tested)    FUNCTIONAL TESTS:  30 sec sit to stand attempted but pt c/o L knee  pain so not completed  GAIT: Distance walked: in clinic up to 41' Assistive device utilized: None Level of assistance: Modified independence Antalgic L, decreased stance time on L 01/31/24: gait with walker, decreased stance time on L noted utilizing heel/toe pattern L  TREATMENT DATE:  01/31/24: Re: evaluation:  Observed pts L knee, skin, aquacel bandage intact, several small areas of dime sized drainage noted.  Fading hematomas L post knee and calf Mod edema L leg thigh to ankle.  No redness noted. Mild warmth L medial knee . Instructed and adapted her home exercise program, as indicated below :  Assisted pt with the following: Supine quad sets, supine assisted L heel slides, L SLR, seated L knee flexion stretch, L LE seated long arc quads with yoga strap assist, L ankle pumps with blue t band  Supine with L LE on wedge for vasopneumatic/game ready for 10 min at 34 degrees to assist with edema and pain post session.  12/25/23:  Evaluation, we discussed/ education regarding clearing pathways, removing throw rugs and tripping hazards.  Discussed whether she needs BSC at home but she has a good set up and doesn't think she needs.  She has a walker , advised her to label with her name and have it brought to hospital day after surgery to have it adjusted appropriately for her. Instructed her in the exercises as described below to perform to engage, provide motor training for stability L LE in preparation for surgery.   PATIENT EDUCATION:  Education details: POC, goals Person educated: Patient Education method: Explanation Education comprehension: verbalized understanding, returned demonstration, verbal cues required, tactile cues required, and needs further education  HOME EXERCISE PROGRAM: Access Code: [email or web address] URL: [email or web address] Date: 01/31/2024 Prepared by: Greig Credit  Exercises - Active Straight Leg Raise with Quad Set  - 1 x daily - 7 x weekly - 3 sets - 10 reps - Ankle and Toe Plantarflexion with Resistance  - 1 x daily - 7 x weekly - 3 sets - 10 reps - Seated Long Arc Quad with Strap  - 1 x daily - 7 x  weekly - 3 sets - 10 reps - Supine Quad Set  - 1 x daily - 7 x weekly - 3 sets - 10 reps - Seated Knee Flexion Slide  - 1 x daily - 7 x weekly - 3 sets - 10 reps    Access Code: [email or web address] URL: [email or web address] Date: 12/25/2023 Prepared by: Greig Credit  Exercises - Active Straight Leg Raise with Quad Set  - 1 x daily - 7 x weekly - 3 sets - 10 reps - Quad Sets with Compression Garment  - 1 x daily - 7 x weekly - 3 sets - 10 reps - Supine Ankle Pumps  - 1 x daily - 7 x weekly - 3 sets - 10 reps - Glute Squeezes with Compression Garment  - 1 x daily - 7 x weekly - 3 sets - 10 reps  ASSESSMENT:  CLINICAL IMPRESSION: Patient is a 69 y.o. female who was reassessed today following her L TKA on 01/26/24, so she is 5 days post op.  She presents with painful L knee particularly anterior patella, also decreased strength and decreased ROM L knee.  Altered gait, assistance needed for bed mobility and for donning her TED hose L.  Her quads strength is weak but she is demonstrating good quads control at this point after her surgery.  She was to have home health care for CNA services but the snow and ice limited their ability to get to her home so she is experiencing less support than anticipated.  She should benefit from physical therapy to address her deficits and assist her with recovering her function L LE.  Her long term  goals are to be able to continue to live independently and be able to travel with her family.   ACTIVITY LIMITATIONS: carrying, lifting, bending, standing, squatting, sleeping, stairs, transfers, bed mobility, bathing, toileting, dressing, and locomotion level  PARTICIPATION LIMITATIONS: meal prep, cleaning, laundry, driving, shopping, community activity, and church  PERSONAL FACTORS: Age, Fitness, Past/current experiences, Time since onset of injury/illness/exacerbation, and 1-2 comorbidities: chronic pain, depression are also affecting patient's functional outcome.    REHAB POTENTIAL: Good  CLINICAL DECISION MAKING: Evolving/moderate complexity  EVALUATION COMPLEXITY: Moderate.   OBJECTIVE IMPAIRMENTS: decreased activity tolerance, decreased endurance, decreased mobility, difficulty walking, decreased ROM, decreased strength, increased edema, impaired perceived functional ability, impaired flexibility, postural dysfunction, and pain   GOALS: Goals reviewed with patient? Yes  SHORT TERM GOALS: Target date: 02/14/24 I in performance of home program in preparation for TKA L Baseline: Goal status: INITIAL   LONG TERM GOALS: Target date: 03/19/24 12 weeks from preop appt:  30 sec sit to stand greater than 10 reps  Baseline: unable to complete due ot L knee pain Goal status: INITIAL  2.  ROM L knee -5 to 115 degrees Baseline: preop 0 to 118 degrees Goal status: INITIAL 01/31/24: see chart above  3.  Strength with MMT L quads, hamstrings 4+/5, no lag with SLR and with L long arc quad Baseline: painful with testing for preop appt Goal status: INITIAL 01/31/24: see chart above  4.  Lower Extremity Functional Score: 23 / 80 = 28.7 % improve to 60% or greater function Baseline:  Goal status: INITIAL 01/31/24:Lower Extremity Functional Score: 13 / 80 = 16.3 %  5.  Gait with LAD device greater than 6 min at 58m/sec for community ambulation Baseline:  Goal status: INITIAL    PLAN:  PT FREQUENCY: preop eval today, will establish frequency on first post op appt on 01/31/24  PT DURATION: 12 weeks  PLANNED INTERVENTIONS: 97110-Therapeutic exercises, 97530- Therapeutic activity, 97112- Neuromuscular re-education, 97535- Self Care, 02859- Manual therapy, 97016- Vasopneumatic device, Patient/Family education, and Cryotherapy  PLAN FOR NEXT SESSION: Need to progress with ROM L knee , pain management , motor recruitment , gait training, edema management  Kiron Osmun L Sheffield Hawker, PT, DPT, OCS 02/07/2024, 2:27 PM   "

## 2024-02-12 ENCOUNTER — Ambulatory Visit

## 2024-02-14 ENCOUNTER — Ambulatory Visit

## 2024-02-19 ENCOUNTER — Ambulatory Visit

## 2024-02-21 ENCOUNTER — Ambulatory Visit

## 2024-02-26 ENCOUNTER — Ambulatory Visit

## 2024-02-28 ENCOUNTER — Ambulatory Visit
